# Patient Record
Sex: Male | Born: 1935
Health system: Southern US, Community
[De-identification: ages and names within clinical notes are randomized; demographics above are authoritative.]

## PROBLEM LIST (undated history)

## (undated) DIAGNOSIS — G8929 Other chronic pain: Secondary | ICD-10-CM

## (undated) DIAGNOSIS — M129 Arthropathy, unspecified: Secondary | ICD-10-CM

## (undated) DIAGNOSIS — D693 Immune thrombocytopenic purpura: Principal | ICD-10-CM

## (undated) DIAGNOSIS — K219 Gastro-esophageal reflux disease without esophagitis: Secondary | ICD-10-CM

## (undated) DIAGNOSIS — J309 Allergic rhinitis, unspecified: Secondary | ICD-10-CM

## (undated) DIAGNOSIS — I10 Essential (primary) hypertension: Secondary | ICD-10-CM

## (undated) DIAGNOSIS — R569 Unspecified convulsions: Secondary | ICD-10-CM

## (undated) DIAGNOSIS — Z972 Presence of dental prosthetic device (complete) (partial): Secondary | ICD-10-CM

## (undated) DIAGNOSIS — D519 Vitamin B12 deficiency anemia, unspecified: Secondary | ICD-10-CM

## (undated) DIAGNOSIS — C44301 Unspecified malignant neoplasm of skin of nose: Secondary | ICD-10-CM

## (undated) DIAGNOSIS — Z87898 Personal history of other specified conditions: Secondary | ICD-10-CM

## (undated) DIAGNOSIS — M542 Cervicalgia: Secondary | ICD-10-CM

## (undated) DIAGNOSIS — D509 Iron deficiency anemia, unspecified: Secondary | ICD-10-CM

## (undated) DIAGNOSIS — I639 Cerebral infarction, unspecified: Secondary | ICD-10-CM

## (undated) HISTORY — DX: Cervicalgia: M54.2

## (undated) HISTORY — DX: Other chronic pain: G89.29

## (undated) HISTORY — PX: UPPER GI ENDOSCOPY: SHX6162

## (undated) HISTORY — DX: Personal history of other specified conditions: Z87.898

## (undated) HISTORY — DX: Gastro-esophageal reflux disease without esophagitis: K21.9

## (undated) HISTORY — DX: Cerebral infarction, unspecified: I63.9

## (undated) HISTORY — DX: Arthropathy, unspecified: M12.9

## (undated) HISTORY — DX: Iron deficiency anemia, unspecified: D50.9

## (undated) HISTORY — DX: Essential (primary) hypertension: I10

## (undated) HISTORY — DX: Allergic rhinitis, unspecified: J30.9

## (undated) HISTORY — DX: Immune thrombocytopenic purpura: D69.3

## (undated) HISTORY — PX: COLONOSCOPY: SHX174

## (undated) HISTORY — DX: Vitamin B12 deficiency anemia, unspecified: D51.9

---

## 1988-10-13 HISTORY — PX: CHOLECYSTECTOMY: SHX55

## 2004-05-09 ENCOUNTER — Other Ambulatory Visit: Payer: Self-pay

## 2010-07-14 ENCOUNTER — Ambulatory Visit: Payer: Self-pay | Admitting: Family Medicine

## 2014-07-16 DIAGNOSIS — D693 Immune thrombocytopenic purpura: Secondary | ICD-10-CM

## 2014-07-16 HISTORY — DX: Immune thrombocytopenic purpura: D69.3

## 2014-07-21 ENCOUNTER — Ambulatory Visit: Payer: Self-pay | Admitting: Internal Medicine

## 2014-07-21 LAB — CBC CANCER CENTER
BASOS ABS: 0 x10 3/mm (ref 0.0–0.1)
Basophil %: 0 %
Eosinophil #: 0 x10 3/mm (ref 0.0–0.7)
Eosinophil %: 0 %
HCT: 36.5 % — ABNORMAL LOW (ref 40.0–52.0)
HGB: 11.6 g/dL — AB (ref 13.0–18.0)
Lymphocyte #: 0.7 x10 3/mm — ABNORMAL LOW (ref 1.0–3.6)
Lymphocyte %: 4.9 %
MCH: 28.6 pg (ref 26.0–34.0)
MCHC: 31.7 g/dL — ABNORMAL LOW (ref 32.0–36.0)
MCV: 90 fL (ref 80–100)
Monocyte #: 0.3 x10 3/mm (ref 0.2–1.0)
Monocyte %: 2.1 %
NEUTROS ABS: 12.5 x10 3/mm — AB (ref 1.4–6.5)
NEUTROS PCT: 93 %
Platelet: 58 x10 3/mm — ABNORMAL LOW (ref 150–440)
RBC: 4.05 10*6/uL — AB (ref 4.40–5.90)
RDW: 14.8 % — ABNORMAL HIGH (ref 11.5–14.5)
WBC: 13.1 x10 3/mm — AB (ref 3.8–10.6)

## 2014-07-21 LAB — IRON AND TIBC
IRON: 43 ug/dL — AB (ref 65–175)
Iron Bind.Cap.(Total): 287 ug/dL (ref 250–450)
Iron Saturation: 15 %
Unbound Iron-Bind.Cap.: 244 ug/dL

## 2014-07-21 LAB — FERRITIN: Ferritin (ARMC): 19 ng/mL (ref 8–388)

## 2014-07-21 LAB — FOLATE: FOLIC ACID: 19.4 ng/mL (ref 3.1–100.0)

## 2014-07-21 LAB — APTT: Activated PTT: 24.7 secs (ref 23.6–35.9)

## 2014-07-21 LAB — RETICULOCYTES
Absolute Retic Count: 0.0799 10*6/uL (ref 0.019–0.186)
Reticulocyte: 1.97 % (ref 0.4–3.1)

## 2014-07-21 LAB — FIBRINOGEN: Fibrinogen: 243 mg/dL (ref 210–470)

## 2014-07-21 LAB — FIBRIN DEGRADATION PROD.(ARMC ONLY): Fibrin Degradation Prod.: <10 ug/ml (ref 2.1–7.7)

## 2014-07-21 LAB — PROTIME-INR
INR: 1
PROTHROMBIN TIME: 12.9 s (ref 11.5–14.7)

## 2014-07-21 LAB — LACTATE DEHYDROGENASE: LDH: 164 U/L (ref 85–241)

## 2014-07-23 LAB — PROT IMMUNOELECTROPHORES(ARMC)

## 2014-07-24 LAB — URINE IEP, RANDOM

## 2014-07-24 LAB — CBC CANCER CENTER
Basophil #: 0 x10 3/mm (ref 0.0–0.1)
Basophil %: 0.1 %
Eosinophil #: 0 x10 3/mm (ref 0.0–0.7)
Eosinophil %: 0 %
HCT: 37.6 % — ABNORMAL LOW (ref 40.0–52.0)
HGB: 12.1 g/dL — ABNORMAL LOW (ref 13.0–18.0)
LYMPHS ABS: 1.5 x10 3/mm (ref 1.0–3.6)
Lymphocyte %: 11.3 %
MCH: 28.7 pg (ref 26.0–34.0)
MCHC: 32.1 g/dL (ref 32.0–36.0)
MCV: 89 fL (ref 80–100)
Monocyte #: 1 x10 3/mm (ref 0.2–1.0)
Monocyte %: 7.4 %
Neutrophil #: 11.1 x10 3/mm — ABNORMAL HIGH (ref 1.4–6.5)
Neutrophil %: 81.2 %
Platelet: 89 x10 3/mm — ABNORMAL LOW (ref 150–440)
RBC: 4.21 10*6/uL — AB (ref 4.40–5.90)
RDW: 14.7 % — ABNORMAL HIGH (ref 11.5–14.5)
WBC: 13.7 x10 3/mm — ABNORMAL HIGH (ref 3.8–10.6)

## 2014-07-28 LAB — BASIC METABOLIC PANEL
Anion Gap: 5 — ABNORMAL LOW (ref 7–16)
BUN: 29 mg/dL — ABNORMAL HIGH (ref 7–18)
CO2: 30 mmol/L (ref 21–32)
Calcium, Total: 8.7 mg/dL (ref 8.5–10.1)
Chloride: 105 mmol/L (ref 98–107)
Creatinine: 1.59 mg/dL — ABNORMAL HIGH (ref 0.60–1.30)
EGFR (African American): 47 — ABNORMAL LOW
GFR CALC NON AF AMER: 40 — AB
Glucose: 154 mg/dL — ABNORMAL HIGH (ref 65–99)
OSMOLALITY: 288 (ref 275–301)
Potassium: 4.2 mmol/L (ref 3.5–5.1)
Sodium: 140 mmol/L (ref 136–145)

## 2014-07-28 LAB — CBC CANCER CENTER
Basophil #: 0 x10 3/mm (ref 0.0–0.1)
Basophil %: 0.2 %
EOS ABS: 0 x10 3/mm (ref 0.0–0.7)
EOS PCT: 0 %
HCT: 39.6 % — ABNORMAL LOW (ref 40.0–52.0)
HGB: 12.7 g/dL — AB (ref 13.0–18.0)
Lymphocyte #: 1.7 x10 3/mm (ref 1.0–3.6)
Lymphocyte %: 8.3 %
MCH: 28.8 pg (ref 26.0–34.0)
MCHC: 32 g/dL (ref 32.0–36.0)
MCV: 90 fL (ref 80–100)
MONO ABS: 1.4 x10 3/mm — AB (ref 0.2–1.0)
Monocyte %: 6.8 %
Neutrophil #: 17.4 x10 3/mm — ABNORMAL HIGH (ref 1.4–6.5)
Neutrophil %: 84.7 %
Platelet: 129 x10 3/mm — ABNORMAL LOW (ref 150–440)
RBC: 4.41 10*6/uL (ref 4.40–5.90)
RDW: 15 % — ABNORMAL HIGH (ref 11.5–14.5)
WBC: 20.6 x10 3/mm — ABNORMAL HIGH (ref 3.8–10.6)

## 2014-08-04 LAB — GLUCOSE, RANDOM: GLUCOSE: 165 mg/dL — AB (ref 65–99)

## 2014-08-04 LAB — CBC CANCER CENTER
BASOS ABS: 0 x10 3/mm (ref 0.0–0.1)
Basophil %: 0.1 %
EOS ABS: 0 x10 3/mm (ref 0.0–0.7)
Eosinophil %: 0.1 %
HCT: 38.4 % — ABNORMAL LOW (ref 40.0–52.0)
HGB: 12.2 g/dL — AB (ref 13.0–18.0)
LYMPHS ABS: 0.7 x10 3/mm — AB (ref 1.0–3.6)
Lymphocyte %: 4 %
MCH: 28.8 pg (ref 26.0–34.0)
MCHC: 31.7 g/dL — AB (ref 32.0–36.0)
MCV: 91 fL (ref 80–100)
Monocyte #: 0.5 x10 3/mm (ref 0.2–1.0)
Monocyte %: 3 %
NEUTROS PCT: 92.8 %
Neutrophil #: 15.5 x10 3/mm — ABNORMAL HIGH (ref 1.4–6.5)
PLATELETS: 119 x10 3/mm — AB (ref 150–440)
RBC: 4.23 10*6/uL — ABNORMAL LOW (ref 4.40–5.90)
RDW: 15.8 % — ABNORMAL HIGH (ref 11.5–14.5)
WBC: 16.7 x10 3/mm — ABNORMAL HIGH (ref 3.8–10.6)

## 2014-08-10 DIAGNOSIS — D509 Iron deficiency anemia, unspecified: Secondary | ICD-10-CM | POA: Insufficient documentation

## 2014-08-11 LAB — CBC CANCER CENTER
BASOS ABS: 0.1 x10 3/mm (ref 0.0–0.1)
Basophil %: 0.7 %
EOS PCT: 0.2 %
Eosinophil #: 0 x10 3/mm (ref 0.0–0.7)
HCT: 37.8 % — ABNORMAL LOW (ref 40.0–52.0)
HGB: 12 g/dL — ABNORMAL LOW (ref 13.0–18.0)
LYMPHS ABS: 0.4 x10 3/mm — AB (ref 1.0–3.6)
Lymphocyte %: 3.8 %
MCH: 29 pg (ref 26.0–34.0)
MCHC: 31.7 g/dL — ABNORMAL LOW (ref 32.0–36.0)
MCV: 92 fL (ref 80–100)
Monocyte #: 0.4 x10 3/mm (ref 0.2–1.0)
Monocyte %: 3.6 %
NEUTROS PCT: 91.7 %
Neutrophil #: 10.7 x10 3/mm — ABNORMAL HIGH (ref 1.4–6.5)
PLATELETS: 106 x10 3/mm — AB (ref 150–440)
RBC: 4.13 10*6/uL — ABNORMAL LOW (ref 4.40–5.90)
RDW: 16.5 % — AB (ref 11.5–14.5)
WBC: 11.7 x10 3/mm — AB (ref 3.8–10.6)

## 2014-08-11 LAB — GLUCOSE, RANDOM: Glucose: 150 mg/dL — ABNORMAL HIGH (ref 65–99)

## 2014-08-13 ENCOUNTER — Ambulatory Visit: Payer: Self-pay | Admitting: Internal Medicine

## 2014-08-18 LAB — CBC CANCER CENTER
Basophil #: 0 x10 3/mm (ref 0.0–0.1)
Basophil %: 0.5 %
Eosinophil #: 0.1 x10 3/mm (ref 0.0–0.7)
Eosinophil %: 0.9 %
HCT: 36.2 % — ABNORMAL LOW (ref 40.0–52.0)
HGB: 11.8 g/dL — ABNORMAL LOW (ref 13.0–18.0)
LYMPHS ABS: 0.4 x10 3/mm — AB (ref 1.0–3.6)
Lymphocyte %: 4.4 %
MCH: 29.3 pg (ref 26.0–34.0)
MCHC: 32.5 g/dL (ref 32.0–36.0)
MCV: 90 fL (ref 80–100)
MONO ABS: 0.4 x10 3/mm (ref 0.2–1.0)
Monocyte %: 5 %
NEUTROS ABS: 7.8 x10 3/mm — AB (ref 1.4–6.5)
NEUTROS PCT: 89.2 %
Platelet: 110 x10 3/mm — ABNORMAL LOW (ref 150–440)
RBC: 4.01 10*6/uL — ABNORMAL LOW (ref 4.40–5.90)
RDW: 16 % — AB (ref 11.5–14.5)
WBC: 8.7 x10 3/mm (ref 3.8–10.6)

## 2014-08-18 LAB — GLUCOSE, RANDOM: GLUCOSE: 135 mg/dL — AB (ref 65–99)

## 2014-08-28 ENCOUNTER — Ambulatory Visit: Payer: Self-pay | Admitting: Gastroenterology

## 2014-08-28 LAB — PLATELET COUNT: Platelet: 255 10*3/uL (ref 150–440)

## 2014-08-28 LAB — PROTIME-INR
INR: 1
Prothrombin Time: 13.3 secs (ref 11.5–14.7)

## 2014-08-31 LAB — PATHOLOGY REPORT

## 2014-09-01 LAB — CBC CANCER CENTER
BASOS ABS: 0 x10 3/mm (ref 0.0–0.1)
Basophil %: 0.3 %
Eosinophil #: 0 x10 3/mm (ref 0.0–0.7)
Eosinophil %: 0.3 %
HCT: 37.5 % — ABNORMAL LOW (ref 40.0–52.0)
HGB: 11.9 g/dL — ABNORMAL LOW (ref 13.0–18.0)
Lymphocyte #: 0.9 x10 3/mm — ABNORMAL LOW (ref 1.0–3.6)
Lymphocyte %: 7.4 %
MCH: 28.2 pg (ref 26.0–34.0)
MCHC: 31.7 g/dL — ABNORMAL LOW (ref 32.0–36.0)
MCV: 89 fL (ref 80–100)
MONO ABS: 0.6 x10 3/mm (ref 0.2–1.0)
Monocyte %: 5.3 %
NEUTROS ABS: 10 x10 3/mm — AB (ref 1.4–6.5)
Neutrophil %: 86.7 %
Platelet: 245 x10 3/mm (ref 150–440)
RBC: 4.21 10*6/uL — ABNORMAL LOW (ref 4.40–5.90)
RDW: 16.1 % — AB (ref 11.5–14.5)
WBC: 11.6 x10 3/mm — AB (ref 3.8–10.6)

## 2014-09-01 LAB — BASIC METABOLIC PANEL
Anion Gap: 6 — ABNORMAL LOW (ref 7–16)
BUN: 21 mg/dL — AB (ref 7–18)
CALCIUM: 8.8 mg/dL (ref 8.5–10.1)
CO2: 28 mmol/L (ref 21–32)
Chloride: 103 mmol/L (ref 98–107)
Creatinine: 1.62 mg/dL — ABNORMAL HIGH (ref 0.60–1.30)
EGFR (African American): 53 — ABNORMAL LOW
GFR CALC NON AF AMER: 44 — AB
Osmolality: 279 (ref 275–301)
Potassium: 5 mmol/L (ref 3.5–5.1)
SODIUM: 137 mmol/L (ref 136–145)

## 2014-09-01 LAB — GLUCOSE, RANDOM: Glucose: 133 mg/dL — ABNORMAL HIGH (ref 65–99)

## 2014-09-13 ENCOUNTER — Ambulatory Visit: Payer: Self-pay | Admitting: Internal Medicine

## 2014-09-14 DIAGNOSIS — K2210 Ulcer of esophagus without bleeding: Secondary | ICD-10-CM | POA: Insufficient documentation

## 2014-09-22 LAB — CBC CANCER CENTER
Basophil #: 0 x10 3/mm (ref 0.0–0.1)
Basophil %: 0.5 %
Eosinophil #: 0.1 x10 3/mm (ref 0.0–0.7)
Eosinophil %: 1.5 %
HCT: 33.9 % — ABNORMAL LOW (ref 40.0–52.0)
HGB: 11.3 g/dL — AB (ref 13.0–18.0)
Lymphocyte #: 1 x10 3/mm (ref 1.0–3.6)
Lymphocyte %: 12.1 %
MCH: 29 pg (ref 26.0–34.0)
MCHC: 33.3 g/dL (ref 32.0–36.0)
MCV: 87 fL (ref 80–100)
Monocyte #: 0.7 x10 3/mm (ref 0.2–1.0)
Monocyte %: 8.5 %
NEUTROS ABS: 6.4 x10 3/mm (ref 1.4–6.5)
Neutrophil %: 77.4 %
Platelet: 177 x10 3/mm (ref 150–440)
RBC: 3.89 10*6/uL — ABNORMAL LOW (ref 4.40–5.90)
RDW: 17.5 % — ABNORMAL HIGH (ref 11.5–14.5)
WBC: 8.2 x10 3/mm (ref 3.8–10.6)

## 2014-09-22 LAB — GLUCOSE, RANDOM: Glucose: 97 mg/dL (ref 65–99)

## 2014-10-13 ENCOUNTER — Ambulatory Visit: Payer: Self-pay | Admitting: Internal Medicine

## 2014-10-13 LAB — CBC CANCER CENTER
BASOS ABS: 0 x10 3/mm (ref 0.0–0.1)
Basophil %: 0.5 %
EOS PCT: 2 %
Eosinophil #: 0.1 x10 3/mm (ref 0.0–0.7)
HCT: 34.9 % — AB (ref 40.0–52.0)
HGB: 11.4 g/dL — AB (ref 13.0–18.0)
Lymphocyte #: 1.3 x10 3/mm (ref 1.0–3.6)
Lymphocyte %: 18 %
MCH: 28.5 pg (ref 26.0–34.0)
MCHC: 32.6 g/dL (ref 32.0–36.0)
MCV: 88 fL (ref 80–100)
MONOS PCT: 8.7 %
Monocyte #: 0.6 x10 3/mm (ref 0.2–1.0)
NEUTROS ABS: 5.1 x10 3/mm (ref 1.4–6.5)
Neutrophil %: 70.8 %
PLATELETS: 162 x10 3/mm (ref 150–440)
RBC: 3.98 10*6/uL — AB (ref 4.40–5.90)
RDW: 17.6 % — AB (ref 11.5–14.5)
WBC: 7.2 x10 3/mm (ref 3.8–10.6)

## 2014-10-13 LAB — GLUCOSE, RANDOM: Glucose: 101 mg/dL — ABNORMAL HIGH (ref 65–99)

## 2014-10-19 ENCOUNTER — Ambulatory Visit: Payer: Self-pay | Admitting: Gastroenterology

## 2014-10-19 LAB — CBC WITH DIFFERENTIAL/PLATELET
BASOS PCT: 0.4 %
Basophil #: 0 10*3/uL (ref 0.0–0.1)
Eosinophil #: 0.1 10*3/uL (ref 0.0–0.7)
Eosinophil %: 2.1 %
HCT: 34.7 % — AB (ref 40.0–52.0)
HGB: 11.4 g/dL — ABNORMAL LOW (ref 13.0–18.0)
LYMPHS ABS: 1.1 10*3/uL (ref 1.0–3.6)
Lymphocyte %: 15.6 %
MCH: 28.9 pg (ref 26.0–34.0)
MCHC: 32.7 g/dL (ref 32.0–36.0)
MCV: 88 fL (ref 80–100)
MONO ABS: 0.7 x10 3/mm (ref 0.2–1.0)
MONOS PCT: 9.6 %
NEUTROS ABS: 5 10*3/uL (ref 1.4–6.5)
NEUTROS PCT: 72.3 %
PLATELETS: 148 10*3/uL — AB (ref 150–440)
RBC: 3.94 10*6/uL — ABNORMAL LOW (ref 4.40–5.90)
RDW: 17.3 % — ABNORMAL HIGH (ref 11.5–14.5)
WBC: 6.9 10*3/uL (ref 3.8–10.6)

## 2014-11-03 LAB — CBC CANCER CENTER
Basophil #: 0 x10 3/mm (ref 0.0–0.1)
Basophil %: 0.2 %
Eosinophil #: 0.1 x10 3/mm (ref 0.0–0.7)
Eosinophil %: 1 %
HCT: 33.8 % — ABNORMAL LOW (ref 40.0–52.0)
HGB: 11 g/dL — ABNORMAL LOW (ref 13.0–18.0)
LYMPHS ABS: 1.1 x10 3/mm (ref 1.0–3.6)
Lymphocyte %: 13.3 %
MCH: 28.3 pg (ref 26.0–34.0)
MCHC: 32.6 g/dL (ref 32.0–36.0)
MCV: 87 fL (ref 80–100)
Monocyte #: 0.8 x10 3/mm (ref 0.2–1.0)
Monocyte %: 9.4 %
NEUTROS PCT: 76.1 %
Neutrophil #: 6.4 x10 3/mm (ref 1.4–6.5)
PLATELETS: 157 x10 3/mm (ref 150–440)
RBC: 3.89 10*6/uL — ABNORMAL LOW (ref 4.40–5.90)
RDW: 16.5 % — ABNORMAL HIGH (ref 11.5–14.5)
WBC: 8.3 x10 3/mm (ref 3.8–10.6)

## 2014-11-03 LAB — GLUCOSE, RANDOM: Glucose: 106 mg/dL — ABNORMAL HIGH (ref 65–99)

## 2014-11-13 ENCOUNTER — Ambulatory Visit: Payer: Self-pay | Admitting: Internal Medicine

## 2014-11-25 LAB — CBC CANCER CENTER
BASOS PCT: 0.2 %
Basophil #: 0 x10 3/mm (ref 0.0–0.1)
EOS PCT: 0 %
Eosinophil #: 0 x10 3/mm (ref 0.0–0.7)
HCT: 34.6 % — AB (ref 40.0–52.0)
HGB: 11.1 g/dL — AB (ref 13.0–18.0)
Lymphocyte #: 0.5 x10 3/mm — ABNORMAL LOW (ref 1.0–3.6)
Lymphocyte %: 7.8 %
MCH: 28.6 pg (ref 26.0–34.0)
MCHC: 32 g/dL (ref 32.0–36.0)
MCV: 89 fL (ref 80–100)
MONO ABS: 0.1 x10 3/mm — AB (ref 0.2–1.0)
Monocyte %: 1.8 %
NEUTROS PCT: 90.2 %
Neutrophil #: 5.5 x10 3/mm (ref 1.4–6.5)
Platelet: 135 x10 3/mm — ABNORMAL LOW (ref 150–440)
RBC: 3.87 10*6/uL — AB (ref 4.40–5.90)
RDW: 15.5 % — ABNORMAL HIGH (ref 11.5–14.5)
WBC: 6.1 x10 3/mm (ref 3.8–10.6)

## 2014-11-25 LAB — BASIC METABOLIC PANEL
ANION GAP: 6 — AB (ref 7–16)
BUN: 12 mg/dL (ref 7–18)
CALCIUM: 7.1 mg/dL — AB (ref 8.5–10.1)
Chloride: 106 mmol/L (ref 98–107)
Co2: 28 mmol/L (ref 21–32)
Creatinine: 1.38 mg/dL — ABNORMAL HIGH (ref 0.60–1.30)
GFR CALC NON AF AMER: 53 — AB
Glucose: 190 mg/dL — ABNORMAL HIGH (ref 65–99)
Osmolality: 284 (ref 275–301)
Potassium: 3.8 mmol/L (ref 3.5–5.1)
SODIUM: 140 mmol/L (ref 136–145)

## 2014-11-29 LAB — WOUND CULTURE

## 2014-12-02 LAB — CBC CANCER CENTER
BASOS ABS: 0 x10 3/mm (ref 0.0–0.1)
Basophil %: 0.5 %
EOS ABS: 0.3 x10 3/mm (ref 0.0–0.7)
Eosinophil %: 4 %
HCT: 34.9 % — ABNORMAL LOW (ref 40.0–52.0)
HGB: 11.2 g/dL — ABNORMAL LOW (ref 13.0–18.0)
LYMPHS PCT: 17.2 %
Lymphocyte #: 1.1 x10 3/mm (ref 1.0–3.6)
MCH: 27.8 pg (ref 26.0–34.0)
MCHC: 32 g/dL (ref 32.0–36.0)
MCV: 87 fL (ref 80–100)
Monocyte #: 0.7 x10 3/mm (ref 0.2–1.0)
Monocyte %: 10.4 %
NEUTROS ABS: 4.5 x10 3/mm (ref 1.4–6.5)
NEUTROS PCT: 67.9 %
Platelet: 173 x10 3/mm (ref 150–440)
RBC: 4.03 10*6/uL — ABNORMAL LOW (ref 4.40–5.90)
RDW: 15.2 % — AB (ref 11.5–14.5)
WBC: 6.6 x10 3/mm (ref 3.8–10.6)

## 2014-12-02 LAB — BASIC METABOLIC PANEL
Anion Gap: 7 (ref 7–16)
BUN: 17 mg/dL (ref 7–18)
CALCIUM: 7.2 mg/dL — AB (ref 8.5–10.1)
CO2: 31 mmol/L (ref 21–32)
Chloride: 105 mmol/L (ref 98–107)
Creatinine: 1.41 mg/dL — ABNORMAL HIGH (ref 0.60–1.30)
EGFR (African American): >60
GFR CALC NON AF AMER: 51 — AB
Glucose: 82 mg/dL (ref 65–99)
Osmolality: 286 (ref 275–301)
Potassium: 3.7 mmol/L (ref 3.5–5.1)
SODIUM: 143 mmol/L (ref 136–145)

## 2014-12-14 ENCOUNTER — Ambulatory Visit: Payer: Self-pay | Admitting: Internal Medicine

## 2014-12-16 LAB — CBC CANCER CENTER
BASOS ABS: 0 x10 3/mm (ref 0.0–0.1)
Basophil %: 0.3 %
EOS ABS: 0.1 x10 3/mm (ref 0.0–0.7)
Eosinophil %: 1.7 %
HCT: 36.6 % — AB (ref 40.0–52.0)
HGB: 11.8 g/dL — ABNORMAL LOW (ref 13.0–18.0)
LYMPHS PCT: 18.5 %
Lymphocyte #: 1.3 x10 3/mm (ref 1.0–3.6)
MCH: 27.9 pg (ref 26.0–34.0)
MCHC: 32.1 g/dL (ref 32.0–36.0)
MCV: 87 fL (ref 80–100)
Monocyte #: 0.7 x10 3/mm (ref 0.2–1.0)
Monocyte %: 9.8 %
NEUTROS PCT: 69.7 %
Neutrophil #: 5 x10 3/mm (ref 1.4–6.5)
Platelet: 136 x10 3/mm — ABNORMAL LOW (ref 150–440)
RBC: 4.22 10*6/uL — ABNORMAL LOW (ref 4.40–5.90)
RDW: 15.9 % — AB (ref 11.5–14.5)
WBC: 7.1 x10 3/mm (ref 3.8–10.6)

## 2015-01-12 ENCOUNTER — Ambulatory Visit
Admit: 2015-01-12 | Disposition: A | Discharge status: Home / Self Care | Payer: Self-pay | Attending: Internal Medicine | Admitting: Internal Medicine

## 2015-02-10 LAB — CBC CANCER CENTER
BASOS ABS: 0 x10 3/mm (ref 0.0–0.1)
Basophil %: 0.5 %
EOS ABS: 0.1 x10 3/mm (ref 0.0–0.7)
Eosinophil %: 1.7 %
HCT: 34.5 % — AB (ref 40.0–52.0)
HGB: 11.4 g/dL — AB (ref 13.0–18.0)
LYMPHS ABS: 1.3 x10 3/mm (ref 1.0–3.6)
Lymphocyte %: 15.7 %
MCH: 28.2 pg (ref 26.0–34.0)
MCHC: 33.1 g/dL (ref 32.0–36.0)
MCV: 85 fL (ref 80–100)
MONOS PCT: 7.9 %
Monocyte #: 0.7 x10 3/mm (ref 0.2–1.0)
Neutrophil #: 6.2 x10 3/mm (ref 1.4–6.5)
Neutrophil %: 74.2 %
Platelet: 153 x10 3/mm (ref 150–440)
RBC: 4.05 10*6/uL — ABNORMAL LOW (ref 4.40–5.90)
RDW: 16 % — AB (ref 11.5–14.5)
WBC: 8.3 x10 3/mm (ref 3.8–10.6)

## 2015-02-12 ENCOUNTER — Ambulatory Visit
Admit: 2015-02-12 | Disposition: A | Discharge status: Home / Self Care | Payer: Self-pay | Attending: Internal Medicine | Admitting: Internal Medicine

## 2015-02-24 LAB — HEPATIC FUNCTION PANEL A (ARMC)
ALBUMIN: 3.9 g/dL
AST: 19 U/L
Alkaline Phosphatase: 43 U/L
BILIRUBIN INDIRECT: 0.8
BILIRUBIN TOTAL: 0.9 mg/dL
Bilirubin, Direct: 0.1 mg/dL
SGPT (ALT): 11 U/L — ABNORMAL LOW
TOTAL PROTEIN: 6.5 g/dL

## 2015-02-24 LAB — BASIC METABOLIC PANEL
ANION GAP: 5 — AB (ref 7–16)
BUN: 14 mg/dL
CALCIUM: 8.8 mg/dL — AB
CO2: 29 mmol/L
Chloride: 103 mmol/L
Creatinine: 1.32 mg/dL — ABNORMAL HIGH
EGFR (African American): 58 — ABNORMAL LOW
EGFR (Non-African Amer.): 50 — ABNORMAL LOW
GLUCOSE: 135 mg/dL — AB
Potassium: 4.1 mmol/L
SODIUM: 137 mmol/L

## 2015-02-24 LAB — CBC CANCER CENTER
BASOS ABS: 0 x10 3/mm (ref 0.0–0.1)
Basophil %: 0.6 %
Eosinophil #: 0.2 x10 3/mm (ref 0.0–0.7)
Eosinophil %: 2.3 %
HCT: 37.2 % — ABNORMAL LOW (ref 40.0–52.0)
HGB: 12.1 g/dL — AB (ref 13.0–18.0)
LYMPHS ABS: 1 x10 3/mm (ref 1.0–3.6)
Lymphocyte %: 14.8 %
MCH: 28.1 pg (ref 26.0–34.0)
MCHC: 32.5 g/dL (ref 32.0–36.0)
MCV: 86 fL (ref 80–100)
MONO ABS: 0.5 x10 3/mm (ref 0.2–1.0)
Monocyte %: 8.1 %
NEUTROS PCT: 74.2 %
Neutrophil #: 5 x10 3/mm (ref 1.4–6.5)
Platelet: 145 x10 3/mm — ABNORMAL LOW (ref 150–440)
RBC: 4.31 10*6/uL — AB (ref 4.40–5.90)
RDW: 16.5 % — ABNORMAL HIGH (ref 11.5–14.5)
WBC: 6.8 x10 3/mm (ref 3.8–10.6)

## 2015-03-08 LAB — SURGICAL PATHOLOGY

## 2015-04-20 ENCOUNTER — Other Ambulatory Visit: Payer: Self-pay

## 2015-04-20 DIAGNOSIS — D696 Thrombocytopenia, unspecified: Secondary | ICD-10-CM

## 2015-04-21 ENCOUNTER — Inpatient Hospital Stay: Payer: PPO | Attending: Family Medicine

## 2015-04-21 DIAGNOSIS — D696 Thrombocytopenia, unspecified: Secondary | ICD-10-CM | POA: Diagnosis not present

## 2015-04-21 LAB — CBC WITH DIFFERENTIAL/PLATELET
Basophils Absolute: 0.1 10*3/uL (ref 0–0.1)
Basophils Relative: 1 %
EOS ABS: 0.2 10*3/uL (ref 0–0.7)
Eosinophils Relative: 2 %
HCT: 36.9 % — ABNORMAL LOW (ref 40.0–52.0)
Hemoglobin: 12 g/dL — ABNORMAL LOW (ref 13.0–18.0)
Lymphocytes Relative: 15 %
Lymphs Abs: 1.4 10*3/uL (ref 1.0–3.6)
MCH: 28.2 pg (ref 26.0–34.0)
MCHC: 32.6 g/dL (ref 32.0–36.0)
MCV: 86.5 fL (ref 80.0–100.0)
Monocytes Absolute: 0.8 10*3/uL (ref 0.2–1.0)
Monocytes Relative: 8 %
NEUTROS ABS: 7.2 10*3/uL — AB (ref 1.4–6.5)
Neutrophils Relative %: 74 %
Platelets: 174 10*3/uL (ref 150–440)
RBC: 4.27 MIL/uL — AB (ref 4.40–5.90)
RDW: 15.6 % — ABNORMAL HIGH (ref 11.5–14.5)
WBC: 9.7 10*3/uL (ref 3.8–10.6)

## 2015-06-16 ENCOUNTER — Other Ambulatory Visit: Payer: Self-pay | Admitting: *Deleted

## 2015-06-16 ENCOUNTER — Inpatient Hospital Stay: Payer: PPO | Attending: Internal Medicine

## 2015-06-16 DIAGNOSIS — D696 Thrombocytopenia, unspecified: Secondary | ICD-10-CM

## 2015-06-16 LAB — CBC WITH DIFFERENTIAL/PLATELET
BASOS ABS: 0 10*3/uL (ref 0–0.1)
Basophils Relative: 1 %
Eosinophils Absolute: 0.3 10*3/uL (ref 0–0.7)
Eosinophils Relative: 3 %
HCT: 35.3 % — ABNORMAL LOW (ref 40.0–52.0)
Hemoglobin: 11.6 g/dL — ABNORMAL LOW (ref 13.0–18.0)
LYMPHS ABS: 1.2 10*3/uL (ref 1.0–3.6)
LYMPHS PCT: 16 %
MCH: 28.4 pg (ref 26.0–34.0)
MCHC: 32.8 g/dL (ref 32.0–36.0)
MCV: 86.3 fL (ref 80.0–100.0)
Monocytes Absolute: 0.6 10*3/uL (ref 0.2–1.0)
Monocytes Relative: 9 %
NEUTROS PCT: 71 %
Neutro Abs: 5.2 10*3/uL (ref 1.4–6.5)
Platelets: 142 10*3/uL — ABNORMAL LOW (ref 150–440)
RBC: 4.08 MIL/uL — AB (ref 4.40–5.90)
RDW: 15.2 % — ABNORMAL HIGH (ref 11.5–14.5)
WBC: 7.3 10*3/uL (ref 3.8–10.6)

## 2015-06-16 LAB — BASIC METABOLIC PANEL
ANION GAP: 5 (ref 5–15)
BUN: 15 mg/dL (ref 6–20)
CALCIUM: 7.8 mg/dL — AB (ref 8.9–10.3)
CHLORIDE: 104 mmol/L (ref 101–111)
CO2: 28 mmol/L (ref 22–32)
Creatinine, Ser: 1.43 mg/dL — ABNORMAL HIGH (ref 0.61–1.24)
GFR, EST AFRICAN AMERICAN: 51 mL/min — AB (ref >60)
GFR, EST NON AFRICAN AMERICAN: 44 mL/min — AB (ref >60)
GLUCOSE: 98 mg/dL (ref 65–99)
POTASSIUM: 4.1 mmol/L (ref 3.5–5.1)
Sodium: 137 mmol/L (ref 135–145)

## 2015-08-11 ENCOUNTER — Inpatient Hospital Stay: Payer: PPO | Attending: Internal Medicine

## 2015-08-11 ENCOUNTER — Other Ambulatory Visit: Payer: Self-pay | Admitting: *Deleted

## 2015-08-11 DIAGNOSIS — D693 Immune thrombocytopenic purpura: Secondary | ICD-10-CM

## 2015-08-11 DIAGNOSIS — D696 Thrombocytopenia, unspecified: Secondary | ICD-10-CM | POA: Insufficient documentation

## 2015-08-11 LAB — CBC WITH DIFFERENTIAL/PLATELET
BASOS ABS: 0 10*3/uL (ref 0–0.1)
Basophils Relative: 0 %
Eosinophils Absolute: 0.2 10*3/uL (ref 0–0.7)
Eosinophils Relative: 3 %
HCT: 37.4 % — ABNORMAL LOW (ref 40.0–52.0)
Hemoglobin: 12.5 g/dL — ABNORMAL LOW (ref 13.0–18.0)
LYMPHS ABS: 1 10*3/uL (ref 1.0–3.6)
Lymphocytes Relative: 12 %
MCH: 28.9 pg (ref 26.0–34.0)
MCHC: 33.5 g/dL (ref 32.0–36.0)
MCV: 86.2 fL (ref 80.0–100.0)
Monocytes Absolute: 0.7 10*3/uL (ref 0.2–1.0)
Monocytes Relative: 8 %
Neutro Abs: 6.5 10*3/uL (ref 1.4–6.5)
Neutrophils Relative %: 77 %
PLATELETS: 145 10*3/uL — AB (ref 150–440)
RBC: 4.34 MIL/uL — AB (ref 4.40–5.90)
RDW: 15.5 % — ABNORMAL HIGH (ref 11.5–14.5)
WBC: 8.4 10*3/uL (ref 3.8–10.6)

## 2015-10-05 ENCOUNTER — Other Ambulatory Visit: Payer: Self-pay | Admitting: *Deleted

## 2015-10-05 DIAGNOSIS — D693 Immune thrombocytopenic purpura: Secondary | ICD-10-CM

## 2015-10-06 ENCOUNTER — Ambulatory Visit: Payer: Self-pay | Admitting: Internal Medicine

## 2015-10-06 ENCOUNTER — Inpatient Hospital Stay (HOSPITAL_BASED_OUTPATIENT_CLINIC_OR_DEPARTMENT_OTHER): Payer: PPO | Admitting: Internal Medicine

## 2015-10-06 ENCOUNTER — Inpatient Hospital Stay: Payer: PPO | Attending: Internal Medicine

## 2015-10-06 ENCOUNTER — Encounter: Payer: Self-pay | Admitting: Internal Medicine

## 2015-10-06 VITALS — BP 162/81 | HR 56 | Temp 97.0°F | Resp 18 | Ht 70.0 in | Wt 195.5 lb

## 2015-10-06 DIAGNOSIS — I129 Hypertensive chronic kidney disease with stage 1 through stage 4 chronic kidney disease, or unspecified chronic kidney disease: Secondary | ICD-10-CM | POA: Diagnosis not present

## 2015-10-06 DIAGNOSIS — Z7982 Long term (current) use of aspirin: Secondary | ICD-10-CM

## 2015-10-06 DIAGNOSIS — Z79899 Other long term (current) drug therapy: Secondary | ICD-10-CM

## 2015-10-06 DIAGNOSIS — D649 Anemia, unspecified: Secondary | ICD-10-CM | POA: Diagnosis not present

## 2015-10-06 DIAGNOSIS — D693 Immune thrombocytopenic purpura: Secondary | ICD-10-CM | POA: Diagnosis not present

## 2015-10-06 DIAGNOSIS — N189 Chronic kidney disease, unspecified: Secondary | ICD-10-CM

## 2015-10-06 LAB — CBC WITH DIFFERENTIAL/PLATELET
Basophils Absolute: 0 10*3/uL (ref 0–0.1)
Basophils Relative: 1 %
EOS PCT: 3 %
Eosinophils Absolute: 0.3 10*3/uL (ref 0–0.7)
HCT: 37 % — ABNORMAL LOW (ref 40.0–52.0)
Hemoglobin: 12.2 g/dL — ABNORMAL LOW (ref 13.0–18.0)
Lymphocytes Relative: 13 %
Lymphs Abs: 1.2 10*3/uL (ref 1.0–3.6)
MCH: 28.5 pg (ref 26.0–34.0)
MCHC: 33.1 g/dL (ref 32.0–36.0)
MCV: 86.3 fL (ref 80.0–100.0)
Monocytes Absolute: 0.7 10*3/uL (ref 0.2–1.0)
Monocytes Relative: 8 %
NEUTROS ABS: 6.6 10*3/uL — AB (ref 1.4–6.5)
Neutrophils Relative %: 75 %
PLATELETS: 117 10*3/uL — AB (ref 150–440)
RBC: 4.29 MIL/uL — ABNORMAL LOW (ref 4.40–5.90)
RDW: 15.3 % — ABNORMAL HIGH (ref 11.5–14.5)
WBC: 8.8 10*3/uL (ref 3.8–10.6)

## 2015-10-06 LAB — COMPREHENSIVE METABOLIC PANEL
ALT: 13 U/L — ABNORMAL LOW (ref 17–63)
AST: 20 U/L (ref 15–41)
Albumin: 3.7 g/dL (ref 3.5–5.0)
Alkaline Phosphatase: 58 U/L (ref 38–126)
Anion gap: 4 — ABNORMAL LOW (ref 5–15)
BUN: 16 mg/dL (ref 6–20)
CO2: 29 mmol/L (ref 22–32)
CREATININE: 1.29 mg/dL — AB (ref 0.61–1.24)
Calcium: 8.5 mg/dL — ABNORMAL LOW (ref 8.9–10.3)
Chloride: 105 mmol/L (ref 101–111)
GFR calc Af Amer: 58 mL/min — ABNORMAL LOW (ref >60)
GFR, EST NON AFRICAN AMERICAN: 50 mL/min — AB (ref >60)
Glucose, Bld: 109 mg/dL — ABNORMAL HIGH (ref 65–99)
Potassium: 4.4 mmol/L (ref 3.5–5.1)
Sodium: 138 mmol/L (ref 135–145)
Total Bilirubin: 0.4 mg/dL (ref 0.3–1.2)
Total Protein: 6.4 g/dL — ABNORMAL LOW (ref 6.5–8.1)

## 2015-10-06 NOTE — Progress Notes (Signed)
[name] OFF[facility] PROGRESS NOTE  Patient Care Team: Lynnell Jude, MD as PCP - General (Family Medicine)   SUMMARY OF ONCOLOGIC HISTORY:  # SEP 2015- ITP [platelets- 16]; s/p prednisone ; Good Response; currently surveillance  # SEP 2015- Mild Anemia- ~12 s/p EGD/colo [Oct 2015; Dr.Skulskie] ; Ferritin- 17; NO BMBx  # CKD creatinine ~1.4  INTERVAL HISTORY:  This is my first interaction with the patient since I joined the practice September 2016. I reviewed the patient's prior charts/pertinent labs/imaging in detail; findings are summarized above.   A very pleasant 81 year old male patient with above history of ITP is here for follow-up. Patient denies any blood blisters or gum bleeding or skin rash or nosebleeds.  Denies any weight loss. Denies any unusual chest pain or shortness of breath or cough. Denies any significant fatigue; above from taking care of his wife who is recuperating from illness.   REVIEW OF SYSTEMS:  A complete 10 point review of system is done which is negative except mentioned above/history of present illness.   PAST MEDICAL HISTORY :  Past Medical History  Diagnosis Date  . HTN (hypertension)   . ITP (idiopathic thrombocytopenic purpura) 07/16/14  . GERD (gastroesophageal reflux disease)   . Allergic rhinitis   . B12 deficiency anemia   . CVA (cerebral infarction)   . History of seizures   . Chronic neck pain   . Arthropathy   . IDA (iron deficiency anemia)     PAST SURGICAL HISTORY :   Past Surgical History  Procedure Laterality Date  . Colonoscopy  10/2014, 08/2014    Dr. Gwenlyn Perking  . Upper gi endoscopy    . Cholecystectomy  10/13/1988    FAMILY HISTORY :   Family History  Problem Relation Age of Onset  . Heart disease    . Cirrhosis Mother     SOCIAL HISTORY:   Social History  Substance Use Topics  . Smoking status: Never Smoker   . Smokeless tobacco: Never Used  . Alcohol Use: No    ALLERGIES:  is allergic to  meperidine.  MEDICATIONS:  Current Outpatient Prescriptions  Medication Sig Dispense Refill  . acetaminophen (TYLENOL) 500 MG tablet Take 1 tablet by mouth daily.    Marland Kitchen aspirin 325 MG tablet Take 1 tablet by mouth daily.    Marland Kitchen levETIRAcetam (KEPPRA) 500 MG tablet Take 1 tablet by mouth daily.    . RABEprazole (ACIPHEX) 20 MG tablet Take 1 tablet by mouth daily. 1 hour before meals     No current facility-administered medications for this visit.    PHYSICAL EXAMINATION:   BP 162/81 mmHg  Pulse 56  Temp(Src) 97 F (36.1 C) (Tympanic)  Resp 18  Ht 5\' 10"  (1.778 m)  Wt 195 lb 8.8 oz (88.7 kg)  BMI 28.06 kg/m2  Filed Weights   10/06/15 1523  Weight: 195 lb 8.8 oz (88.7 kg)    GENERAL: Elderly Caucasian male patient who appears younger than his stated age; Alert, no distress and comfortable.   Alone EYES: no pallor or icterus OROPHARYNX: no thrush or ulceration; good dentition  NECK: supple, no masses felt LYMPH:  no palpable lymphadenopathy in the cervical, axillary or inguinal regions LUNGS: clear to auscultation and  No wheeze or crackles HEART/CVS: regular rate & rhythm and no murmurs; No lower extremity edema ABDOMEN:abdomen soft, non-tender and normal bowel sounds Musculoskeletal:no cyanosis of digits and no clubbing  PSYCH: alert & oriented x 3 with fluent speech NEURO: no  focal motor/sensory deficits SKIN:  no rashes or significant lesions  LABORATORY DATA:  I have reviewed the data as listed    Component Value Date/Time   NA 138 10/06/2015 1450   NA 137 02/24/2015 1131   K 4.4 10/06/2015 1450   K 4.1 02/24/2015 1131   CL 105 10/06/2015 1450   CL 103 02/24/2015 1131   CO2 29 10/06/2015 1450   CO2 29 02/24/2015 1131   GLUCOSE 109* 10/06/2015 1450   GLUCOSE 135* 02/24/2015 1131   BUN 16 10/06/2015 1450   BUN 14 02/24/2015 1131   CREATININE 1.29* 10/06/2015 1450   CREATININE 1.32* 02/24/2015 1131   CALCIUM 8.5* 10/06/2015 1450   CALCIUM 8.8* 02/24/2015 1131    PROT 6.4* 10/06/2015 1450   PROT 6.5 02/24/2015 1131   ALBUMIN 3.7 10/06/2015 1450   ALBUMIN 3.9 02/24/2015 1131   AST 20 10/06/2015 1450   AST 19 02/24/2015 1131   ALT 13* 10/06/2015 1450   ALT 11* 02/24/2015 1131   ALKPHOS 58 10/06/2015 1450   ALKPHOS 43 02/24/2015 1131   BILITOT 0.4 10/06/2015 1450   BILITOT 0.9 02/24/2015 1131   GFRNONAA 50* 10/06/2015 1450   GFRNONAA 50* 02/24/2015 1131   GFRNONAA 51* 12/02/2014 1313   GFRAA 58* 10/06/2015 1450   GFRAA 58* 02/24/2015 1131   GFRAA >60 12/02/2014 1313    No results found for: SPEP, UPEP  Lab Results  Component Value Date   WBC 8.8 10/06/2015   NEUTROABS 6.6* 10/06/2015   HGB 12.2* 10/06/2015   HCT 37.0* 10/06/2015   MCV 86.3 10/06/2015   PLT 117* 10/06/2015      Chemistry      Component Value Date/Time   NA 138 10/06/2015 1450   NA 137 02/24/2015 1131   K 4.4 10/06/2015 1450   K 4.1 02/24/2015 1131   CL 105 10/06/2015 1450   CL 103 02/24/2015 1131   CO2 29 10/06/2015 1450   CO2 29 02/24/2015 1131   BUN 16 10/06/2015 1450   BUN 14 02/24/2015 1131   CREATININE 1.29* 10/06/2015 1450   CREATININE 1.32* 02/24/2015 1131      Component Value Date/Time   CALCIUM 8.5* 10/06/2015 1450   CALCIUM 8.8* 02/24/2015 1131   ALKPHOS 58 10/06/2015 1450   ALKPHOS 43 02/24/2015 1131   AST 20 10/06/2015 1450   AST 19 02/24/2015 1131   ALT 13* 10/06/2015 1450   ALT 11* 02/24/2015 1131   BILITOT 0.4 10/06/2015 1450   BILITOT 0.9 02/24/2015 1131       RADIOGRAPHIC STUDIES: I have personally reviewed the radiological images as listed and agreed with the findings in the report. No results found.   ASSESSMENT & PLAN:   # ITP- currently on surveillance. Patient's platelet counts have been > 100,000 at least since September 2015. Patient is been off steroids since December 2015. It appears the patient had a good response to steroids the platelets going up to 200.  Today platelet count is 117. Recommend continued  surveillance.   # Mild anemia. Today Patient's hemoglobin stable at 12 EGD colonoscopy negative.  # Follow-up CBC in 3 months; 6 months/follow-up with me in 6 months.     Cammie Sickle, MD 10/06/2015 3:43 PM

## 2016-01-05 ENCOUNTER — Inpatient Hospital Stay: Payer: PPO | Attending: Internal Medicine

## 2016-01-05 DIAGNOSIS — D693 Immune thrombocytopenic purpura: Secondary | ICD-10-CM | POA: Insufficient documentation

## 2016-01-05 LAB — CBC WITH DIFFERENTIAL/PLATELET
BASOS PCT: 1 %
Basophils Absolute: 0 10*3/uL (ref 0–0.1)
Eosinophils Absolute: 0.2 10*3/uL (ref 0–0.7)
Eosinophils Relative: 3 %
HEMATOCRIT: 37.6 % — AB (ref 40.0–52.0)
HEMOGLOBIN: 12.4 g/dL — AB (ref 13.0–18.0)
Lymphocytes Relative: 15 %
Lymphs Abs: 1.1 10*3/uL (ref 1.0–3.6)
MCH: 28.7 pg (ref 26.0–34.0)
MCHC: 33 g/dL (ref 32.0–36.0)
MCV: 87 fL (ref 80.0–100.0)
MONOS PCT: 10 %
Monocytes Absolute: 0.7 10*3/uL (ref 0.2–1.0)
NEUTROS PCT: 73 %
Neutro Abs: 5.3 10*3/uL (ref 1.4–6.5)
Platelets: 124 10*3/uL — ABNORMAL LOW (ref 150–440)
RBC: 4.32 MIL/uL — AB (ref 4.40–5.90)
RDW: 15 % — ABNORMAL HIGH (ref 11.5–14.5)
WBC: 7.2 10*3/uL (ref 3.8–10.6)

## 2016-01-05 LAB — BASIC METABOLIC PANEL
ANION GAP: 6 (ref 5–15)
BUN: 19 mg/dL (ref 6–20)
CHLORIDE: 104 mmol/L (ref 101–111)
CO2: 27 mmol/L (ref 22–32)
Calcium: 8.5 mg/dL — ABNORMAL LOW (ref 8.9–10.3)
Creatinine, Ser: 1.36 mg/dL — ABNORMAL HIGH (ref 0.61–1.24)
GFR calc non Af Amer: 47 mL/min — ABNORMAL LOW (ref >60)
GFR, EST AFRICAN AMERICAN: 55 mL/min — AB (ref >60)
Glucose, Bld: 107 mg/dL — ABNORMAL HIGH (ref 65–99)
POTASSIUM: 4.2 mmol/L (ref 3.5–5.1)
Sodium: 137 mmol/L (ref 135–145)

## 2016-01-06 ENCOUNTER — Other Ambulatory Visit: Payer: PPO

## 2016-01-07 ENCOUNTER — Telehealth: Payer: Self-pay | Admitting: *Deleted

## 2016-01-07 ENCOUNTER — Other Ambulatory Visit: Payer: Self-pay | Admitting: Family Medicine

## 2016-01-07 NOTE — Telephone Encounter (Signed)
-----   Message from Cammie Sickle, MD sent at 01/07/2016 12:22 PM EST ----- Please inform pt that labs are steady- follow up as schdeduled.

## 2016-01-27 DIAGNOSIS — D519 Vitamin B12 deficiency anemia, unspecified: Secondary | ICD-10-CM | POA: Diagnosis not present

## 2016-01-27 DIAGNOSIS — J301 Allergic rhinitis due to pollen: Secondary | ICD-10-CM | POA: Diagnosis not present

## 2016-04-05 ENCOUNTER — Inpatient Hospital Stay (HOSPITAL_BASED_OUTPATIENT_CLINIC_OR_DEPARTMENT_OTHER): Payer: PPO | Admitting: Internal Medicine

## 2016-04-05 ENCOUNTER — Inpatient Hospital Stay: Payer: PPO | Attending: Internal Medicine

## 2016-04-05 VITALS — BP 149/90 | HR 64 | Temp 96.8°F | Resp 18 | Wt 184.9 lb

## 2016-04-05 DIAGNOSIS — D693 Immune thrombocytopenic purpura: Secondary | ICD-10-CM | POA: Diagnosis not present

## 2016-04-05 DIAGNOSIS — E538 Deficiency of other specified B group vitamins: Secondary | ICD-10-CM | POA: Diagnosis not present

## 2016-04-05 DIAGNOSIS — I129 Hypertensive chronic kidney disease with stage 1 through stage 4 chronic kidney disease, or unspecified chronic kidney disease: Secondary | ICD-10-CM | POA: Diagnosis not present

## 2016-04-05 DIAGNOSIS — Z8669 Personal history of other diseases of the nervous system and sense organs: Secondary | ICD-10-CM

## 2016-04-05 DIAGNOSIS — J309 Allergic rhinitis, unspecified: Secondary | ICD-10-CM | POA: Insufficient documentation

## 2016-04-05 DIAGNOSIS — D649 Anemia, unspecified: Secondary | ICD-10-CM | POA: Insufficient documentation

## 2016-04-05 DIAGNOSIS — N189 Chronic kidney disease, unspecified: Secondary | ICD-10-CM

## 2016-04-05 DIAGNOSIS — Z79899 Other long term (current) drug therapy: Secondary | ICD-10-CM | POA: Insufficient documentation

## 2016-04-05 DIAGNOSIS — K219 Gastro-esophageal reflux disease without esophagitis: Secondary | ICD-10-CM

## 2016-04-05 DIAGNOSIS — Z7982 Long term (current) use of aspirin: Secondary | ICD-10-CM

## 2016-04-05 DIAGNOSIS — L03211 Cellulitis of face: Secondary | ICD-10-CM | POA: Diagnosis not present

## 2016-04-05 DIAGNOSIS — Z8673 Personal history of transient ischemic attack (TIA), and cerebral infarction without residual deficits: Secondary | ICD-10-CM | POA: Insufficient documentation

## 2016-04-05 DIAGNOSIS — D509 Iron deficiency anemia, unspecified: Secondary | ICD-10-CM

## 2016-04-05 LAB — CBC WITH DIFFERENTIAL/PLATELET
BASOS ABS: 0 10*3/uL (ref 0–0.1)
BASOS PCT: 1 %
EOS ABS: 0.3 10*3/uL (ref 0–0.7)
EOS PCT: 3 %
HCT: 37.3 % — ABNORMAL LOW (ref 40.0–52.0)
Hemoglobin: 12.4 g/dL — ABNORMAL LOW (ref 13.0–18.0)
Lymphocytes Relative: 14 %
Lymphs Abs: 1 10*3/uL (ref 1.0–3.6)
MCH: 28.7 pg (ref 26.0–34.0)
MCHC: 33.2 g/dL (ref 32.0–36.0)
MCV: 86.5 fL (ref 80.0–100.0)
MONO ABS: 0.6 10*3/uL (ref 0.2–1.0)
Monocytes Relative: 8 %
Neutro Abs: 5.7 10*3/uL (ref 1.4–6.5)
Neutrophils Relative %: 74 %
Platelets: 136 10*3/uL — ABNORMAL LOW (ref 150–440)
RBC: 4.31 MIL/uL — AB (ref 4.40–5.90)
RDW: 15.3 % — AB (ref 11.5–14.5)
WBC: 7.6 10*3/uL (ref 3.8–10.6)

## 2016-04-05 LAB — BASIC METABOLIC PANEL
ANION GAP: 6 (ref 5–15)
BUN: 13 mg/dL (ref 6–20)
CO2: 28 mmol/L (ref 22–32)
Calcium: 8.4 mg/dL — ABNORMAL LOW (ref 8.9–10.3)
Chloride: 106 mmol/L (ref 101–111)
Creatinine, Ser: 1.44 mg/dL — ABNORMAL HIGH (ref 0.61–1.24)
GFR calc Af Amer: 51 mL/min — ABNORMAL LOW (ref >60)
GFR, EST NON AFRICAN AMERICAN: 44 mL/min — AB (ref >60)
GLUCOSE: 112 mg/dL — AB (ref 65–99)
POTASSIUM: 4.1 mmol/L (ref 3.5–5.1)
SODIUM: 140 mmol/L (ref 135–145)

## 2016-04-05 LAB — IRON AND TIBC
Iron: 32 ug/dL — ABNORMAL LOW (ref 45–182)
Saturation Ratios: 13 % — ABNORMAL LOW (ref 17.9–39.5)
TIBC: 256 ug/dL (ref 250–450)
UIBC: 224 ug/dL

## 2016-04-05 LAB — FERRITIN: FERRITIN: 17 ng/mL — AB (ref 24–336)

## 2016-04-05 MED ORDER — CEPHALEXIN 500 MG PO CAPS: 500.0000 mg | ORAL_CAPSULE | Freq: Three times a day (TID) | ORAL | Status: DC

## 2016-04-05 NOTE — Progress Notes (Signed)
[facility] OFFICE PROGRESS NOTE  Patient Care Team: Lynnell Jude, MD as PCP - General (Family Medicine)   SUMMARY OF ONCOLOGIC HISTORY:  # SEP 2015- ITP [platelets- 16]; s/p prednisone ; Good Response; currently surveillance  # SEP 2015- Mild Anemia- ~12 s/p EGD/colo [Oct 2015; Dr.Skulskie] ; Ferritin- 17; NO BMBx; May 2017- Start PO iron  # CKD creatinine ~1.4 [no nephro]   INTERVAL HISTORY:  A very pleasant 82 year old male patient with above history of ITP is here for follow-up. Patient denies any blood blisters or gum bleeding or skin rash or nosebleeds.  He complains of significant fatigue. He denies any blood in stools black stools. Denies any weight loss. Denies any unusual chest pain or shortness of breath or cough.   Patient's wife noted to have mild swelling and redness of the right of the face under the eye. No eye pain. No fevers.  REVIEW OF SYSTEMS:  A complete 10 point review of system is done which is negative except mentioned above/history of present illness.   PAST MEDICAL HISTORY :  Past Medical History  Diagnosis Date  . HTN (hypertension)   . ITP (idiopathic thrombocytopenic purpura) 07/16/14  . GERD (gastroesophageal reflux disease)   . Allergic rhinitis   . B12 deficiency anemia   . CVA (cerebral infarction)   . History of seizures   . Chronic neck pain   . Arthropathy   . IDA (iron deficiency anemia)     PAST SURGICAL HISTORY :   Past Surgical History  Procedure Laterality Date  . Colonoscopy  10/2014, 08/2014    Dr. Gwenlyn Perking  . Upper gi endoscopy    . Cholecystectomy  10/13/1988    FAMILY HISTORY :   Family History  Problem Relation Age of Onset  . Heart disease    . Cirrhosis Mother     SOCIAL HISTORY:   Social History  Substance Use Topics  . Smoking status: Never Smoker   . Smokeless tobacco: Never Used  . Alcohol Use: No    ALLERGIES:  is allergic to meperidine.  MEDICATIONS:  Current Outpatient Prescriptions   Medication Sig Dispense Refill  . acetaminophen (TYLENOL) 500 MG tablet Take 1 tablet by mouth daily.    Marland Kitchen aspirin 325 MG tablet Take 1 tablet by mouth daily.    Marland Kitchen levETIRAcetam (KEPPRA) 500 MG tablet Take 1 tablet by mouth daily.    . RABEprazole (ACIPHEX) 20 MG tablet Take 1 tablet by mouth daily. 1 hour before meals    . triamcinolone cream (KENALOG) 0.1 %      No current facility-administered medications for this visit.    PHYSICAL EXAMINATION:   BP 149/90 mmHg  Pulse 64  Temp(Src) 96.8 F (36 C) (Tympanic)  Wt 184 lb 14.8 oz (83.88 kg)  Filed Weights   04/05/16 1422  Weight: 184 lb 14.8 oz (83.88 kg)    GENERAL: Elderly Caucasian male patient who appears younger than his stated age; Alert, no distress and comfortable.  Accompanied by his wife. EYES: no pallor or icterus OROPHARYNX: no thrush or ulceration; good dentition  NECK: supple, no masses felt LYMPH:  no palpable lymphadenopathy in the cervical, axillary or inguinal regions LUNGS: clear to auscultation and  No wheeze or crackles HEART/CVS: regular rate & rhythm and no murmurs; No lower extremity edema ABDOMEN:abdomen soft, non-tender and normal bowel sounds Musculoskeletal:no cyanosis of digits and no clubbing  PSYCH: alert & oriented x 3 with fluent speech NEURO: no focal motor/sensory  deficits SKIN:  Erythema and mild warmth noted right of the face.   LABORATORY DATA:  I have reviewed the data as listed    Component Value Date/Time   NA 140 04/05/2016 1409   NA 137 02/24/2015 1131   K 4.1 04/05/2016 1409   K 4.1 02/24/2015 1131   CL 106 04/05/2016 1409   CL 103 02/24/2015 1131   CO2 28 04/05/2016 1409   CO2 29 02/24/2015 1131   GLUCOSE 112* 04/05/2016 1409   GLUCOSE 135* 02/24/2015 1131   BUN 13 04/05/2016 1409   BUN 14 02/24/2015 1131   CREATININE 1.44* 04/05/2016 1409   CREATININE 1.32* 02/24/2015 1131   CALCIUM 8.4* 04/05/2016 1409   CALCIUM 8.8* 02/24/2015 1131   PROT 6.4* 10/06/2015 1450    PROT 6.5 02/24/2015 1131   ALBUMIN 3.7 10/06/2015 1450   ALBUMIN 3.9 02/24/2015 1131   AST 20 10/06/2015 1450   AST 19 02/24/2015 1131   ALT 13* 10/06/2015 1450   ALT 11* 02/24/2015 1131   ALKPHOS 58 10/06/2015 1450   ALKPHOS 43 02/24/2015 1131   BILITOT 0.4 10/06/2015 1450   BILITOT 0.9 02/24/2015 1131   GFRNONAA 44* 04/05/2016 1409   GFRNONAA 50* 02/24/2015 1131   GFRNONAA 51* 12/02/2014 1313   GFRAA 51* 04/05/2016 1409   GFRAA 58* 02/24/2015 1131   GFRAA >60 12/02/2014 1313    No results found for: SPEP, UPEP  Lab Results  Component Value Date   WBC 7.6 04/05/2016   NEUTROABS 5.7 04/05/2016   HGB 12.4* 04/05/2016   HCT 37.3* 04/05/2016   MCV 86.5 04/05/2016   PLT 136* 04/05/2016      Chemistry      Component Value Date/Time   NA 140 04/05/2016 1409   NA 137 02/24/2015 1131   K 4.1 04/05/2016 1409   K 4.1 02/24/2015 1131   CL 106 04/05/2016 1409   CL 103 02/24/2015 1131   CO2 28 04/05/2016 1409   CO2 29 02/24/2015 1131   BUN 13 04/05/2016 1409   BUN 14 02/24/2015 1131   CREATININE 1.44* 04/05/2016 1409   CREATININE 1.32* 02/24/2015 1131      Component Value Date/Time   CALCIUM 8.4* 04/05/2016 1409   CALCIUM 8.8* 02/24/2015 1131   ALKPHOS 58 10/06/2015 1450   ALKPHOS 43 02/24/2015 1131   AST 20 10/06/2015 1450   AST 19 02/24/2015 1131   ALT 13* 10/06/2015 1450   ALT 11* 02/24/2015 1131   BILITOT 0.4 10/06/2015 1450   BILITOT 0.9 02/24/2015 1131        ASSESSMENT & PLAN:   # ITP-Steroid responsive. currently on surveillance. Patient's platelet counts have been > 100,000 at least since September 2015. Today platelets are 120. Asymptomatic. Continue surveillance.  # Mild anemia/likely from CKD. Check Iron studies. Today Patient's hemoglobin stable at 12 EGD colonoscopy negative. Patient is symptomatic from significant fatigue. Recommend by mouth iron pills. If intolerant then recommend IV iron. Patient is also recommended taking a stool  softener.  # CKD- Creatinine 1.4 stable. Reviewed the labs with the patient and family in detail.  # Erythema and swelling of the right side of the face infraorbital- cellulitis recommend Keflex for 7 days. If it does not improve recommend follow up with PCP.  # Follow-up  6 months/follow-up with me labs.   # 25 minutes face-to-face with the patient discussing the above plan of care; more than 50% of time spent on prognosis/ natural history; counseling and coordination.  Cammie Sickle, MD 04/05/2016 2:34 PM

## 2016-04-05 NOTE — Progress Notes (Signed)
Patient ambulates independently without assistance, brought to exam room.  BP 149/90, vitals documented.  Patient c/o right eye irritation, states he was putting mulch down in yard 04/04/16 and shortly after felt eye irritation and redness.  Medication record updated, information provided by patient.

## 2016-04-05 NOTE — Progress Notes (Signed)
Correction- Patient's platelets today are 136; hemoglobin 12.4.

## 2016-06-12 DIAGNOSIS — Z6827 Body mass index (BMI) 27.0-27.9, adult: Secondary | ICD-10-CM | POA: Diagnosis not present

## 2016-06-12 DIAGNOSIS — F5221 Male erectile disorder: Secondary | ICD-10-CM | POA: Diagnosis not present

## 2016-06-12 DIAGNOSIS — D519 Vitamin B12 deficiency anemia, unspecified: Secondary | ICD-10-CM | POA: Diagnosis not present

## 2016-06-12 DIAGNOSIS — I10 Essential (primary) hypertension: Secondary | ICD-10-CM | POA: Diagnosis not present

## 2016-07-13 DIAGNOSIS — D519 Vitamin B12 deficiency anemia, unspecified: Secondary | ICD-10-CM | POA: Diagnosis not present

## 2016-07-13 DIAGNOSIS — Z6827 Body mass index (BMI) 27.0-27.9, adult: Secondary | ICD-10-CM | POA: Diagnosis not present

## 2016-07-13 DIAGNOSIS — L03211 Cellulitis of face: Secondary | ICD-10-CM | POA: Diagnosis not present

## 2016-09-18 DIAGNOSIS — Z23 Encounter for immunization: Secondary | ICD-10-CM | POA: Diagnosis not present

## 2016-09-18 DIAGNOSIS — Z6828 Body mass index (BMI) 28.0-28.9, adult: Secondary | ICD-10-CM | POA: Diagnosis not present

## 2016-09-18 DIAGNOSIS — D519 Vitamin B12 deficiency anemia, unspecified: Secondary | ICD-10-CM | POA: Diagnosis not present

## 2016-09-18 DIAGNOSIS — M1711 Unilateral primary osteoarthritis, right knee: Secondary | ICD-10-CM | POA: Diagnosis not present

## 2016-09-29 DIAGNOSIS — L309 Dermatitis, unspecified: Secondary | ICD-10-CM | POA: Diagnosis not present

## 2016-09-29 DIAGNOSIS — A881 Epidemic vertigo: Secondary | ICD-10-CM | POA: Diagnosis not present

## 2016-09-29 DIAGNOSIS — Z6827 Body mass index (BMI) 27.0-27.9, adult: Secondary | ICD-10-CM | POA: Diagnosis not present

## 2016-10-09 ENCOUNTER — Inpatient Hospital Stay: Payer: PPO | Attending: Internal Medicine

## 2016-10-09 ENCOUNTER — Inpatient Hospital Stay (HOSPITAL_BASED_OUTPATIENT_CLINIC_OR_DEPARTMENT_OTHER): Payer: PPO | Admitting: Internal Medicine

## 2016-10-09 DIAGNOSIS — D509 Iron deficiency anemia, unspecified: Secondary | ICD-10-CM

## 2016-10-09 DIAGNOSIS — E538 Deficiency of other specified B group vitamins: Secondary | ICD-10-CM | POA: Insufficient documentation

## 2016-10-09 DIAGNOSIS — R350 Frequency of micturition: Secondary | ICD-10-CM

## 2016-10-09 DIAGNOSIS — K219 Gastro-esophageal reflux disease without esophagitis: Secondary | ICD-10-CM | POA: Diagnosis not present

## 2016-10-09 DIAGNOSIS — Z8669 Personal history of other diseases of the nervous system and sense organs: Secondary | ICD-10-CM | POA: Insufficient documentation

## 2016-10-09 DIAGNOSIS — L03211 Cellulitis of face: Secondary | ICD-10-CM

## 2016-10-09 DIAGNOSIS — Z79899 Other long term (current) drug therapy: Secondary | ICD-10-CM | POA: Diagnosis not present

## 2016-10-09 DIAGNOSIS — I129 Hypertensive chronic kidney disease with stage 1 through stage 4 chronic kidney disease, or unspecified chronic kidney disease: Secondary | ICD-10-CM | POA: Diagnosis not present

## 2016-10-09 DIAGNOSIS — R3915 Urgency of urination: Secondary | ICD-10-CM | POA: Diagnosis not present

## 2016-10-09 DIAGNOSIS — Z7982 Long term (current) use of aspirin: Secondary | ICD-10-CM | POA: Diagnosis not present

## 2016-10-09 DIAGNOSIS — R5383 Other fatigue: Secondary | ICD-10-CM

## 2016-10-09 DIAGNOSIS — M129 Arthropathy, unspecified: Secondary | ICD-10-CM | POA: Insufficient documentation

## 2016-10-09 DIAGNOSIS — N189 Chronic kidney disease, unspecified: Secondary | ICD-10-CM

## 2016-10-09 DIAGNOSIS — D693 Immune thrombocytopenic purpura: Secondary | ICD-10-CM | POA: Diagnosis not present

## 2016-10-09 DIAGNOSIS — M542 Cervicalgia: Secondary | ICD-10-CM | POA: Insufficient documentation

## 2016-10-09 DIAGNOSIS — Z8673 Personal history of transient ischemic attack (TIA), and cerebral infarction without residual deficits: Secondary | ICD-10-CM

## 2016-10-09 LAB — CBC WITH DIFFERENTIAL/PLATELET
Basophils Absolute: 0 10*3/uL (ref 0–0.1)
Basophils Relative: 1 %
EOS ABS: 0.2 10*3/uL (ref 0–0.7)
EOS PCT: 3 %
HCT: 37.4 % — ABNORMAL LOW (ref 40.0–52.0)
Hemoglobin: 12.6 g/dL — ABNORMAL LOW (ref 13.0–18.0)
LYMPHS ABS: 1.1 10*3/uL (ref 1.0–3.6)
Lymphocytes Relative: 15 %
MCH: 29.3 pg (ref 26.0–34.0)
MCHC: 33.7 g/dL (ref 32.0–36.0)
MCV: 86.9 fL (ref 80.0–100.0)
Monocytes Absolute: 0.7 10*3/uL (ref 0.2–1.0)
Monocytes Relative: 9 %
Neutro Abs: 5.6 10*3/uL (ref 1.4–6.5)
Neutrophils Relative %: 72 %
PLATELETS: 149 10*3/uL — AB (ref 150–440)
RBC: 4.3 MIL/uL — ABNORMAL LOW (ref 4.40–5.90)
RDW: 14.9 % — ABNORMAL HIGH (ref 11.5–14.5)
WBC: 7.7 10*3/uL (ref 3.8–10.6)

## 2016-10-09 LAB — COMPREHENSIVE METABOLIC PANEL
ALT: 12 U/L — ABNORMAL LOW (ref 17–63)
AST: 18 U/L (ref 15–41)
Albumin: 3.8 g/dL (ref 3.5–5.0)
Alkaline Phosphatase: 47 U/L (ref 38–126)
Anion gap: 8 (ref 5–15)
BUN: 21 mg/dL — ABNORMAL HIGH (ref 6–20)
CO2: 27 mmol/L (ref 22–32)
Calcium: 8.7 mg/dL — ABNORMAL LOW (ref 8.9–10.3)
Chloride: 103 mmol/L (ref 101–111)
Creatinine, Ser: 1.43 mg/dL — ABNORMAL HIGH (ref 0.61–1.24)
GFR calc non Af Amer: 44 mL/min — ABNORMAL LOW (ref >60)
GFR, EST AFRICAN AMERICAN: 51 mL/min — AB (ref >60)
Glucose, Bld: 103 mg/dL — ABNORMAL HIGH (ref 65–99)
Potassium: 4.5 mmol/L (ref 3.5–5.1)
Sodium: 138 mmol/L (ref 135–145)
Total Bilirubin: 0.7 mg/dL (ref 0.3–1.2)
Total Protein: 6.8 g/dL (ref 6.5–8.1)

## 2016-10-09 LAB — IRON AND TIBC
Iron: 37 ug/dL — ABNORMAL LOW (ref 45–182)
Saturation Ratios: 14 % — ABNORMAL LOW (ref 17.9–39.5)
TIBC: 266 ug/dL (ref 250–450)
UIBC: 229 ug/dL

## 2016-10-09 LAB — FERRITIN: FERRITIN: 24 ng/mL (ref 24–336)

## 2016-10-09 NOTE — Progress Notes (Signed)
[facility] OFFICE PROGRESS NOTE  Patient Care Team: Lynnell Jude, MD as PCP - General (Family Medicine)   SUMMARY OF ONCOLOGIC HISTORY:  # SEP 2015- ITP [platelets- 16]; s/p prednisone ; Good Response; currently surveillance  # SEP 2015- Mild Anemia- ~12 s/p EGD/colo [Oct 2015; Dr.Skulskie] ; Ferritin- 17; NO BMBx; May 2017- Start PO iron  # CKD creatinine ~1.4 [no nephro]   INTERVAL HISTORY:  A very pleasant 82 year old male patient with above history of ITP is here for follow-up. Patient denies any blood blisters or gum bleeding or skin rash or nosebleeds.  Patient complains of increased frequency of urination especially night. Also complains of urgency; poor stream intermittently.  He complains of significant fatigue. He denies any blood in stools black stools. Denies any weight loss. Denies any unusual chest pain or shortness of breath or cough.   REVIEW OF SYSTEMS:  A complete 10 point review of system is done which is negative except mentioned above/history of present illness.   PAST MEDICAL HISTORY :  Past Medical History:  Diagnosis Date  . Allergic rhinitis   . Arthropathy   . B12 deficiency anemia   . Chronic neck pain   . CVA (cerebral infarction)   . GERD (gastroesophageal reflux disease)   . History of seizures   . HTN (hypertension)   . IDA (iron deficiency anemia)   . ITP (idiopathic thrombocytopenic purpura) 07/16/14    PAST SURGICAL HISTORY :   Past Surgical History:  Procedure Laterality Date  . CHOLECYSTECTOMY  10/13/1988  . COLONOSCOPY  10/2014, 08/2014   Dr. Gwenlyn Perking  . UPPER GI ENDOSCOPY      FAMILY HISTORY :   Family History  Problem Relation Age of Onset  . Heart disease    . Cirrhosis Mother     SOCIAL HISTORY:   Social History  Substance Use Topics  . Smoking status: Never Smoker  . Smokeless tobacco: Never Used  . Alcohol use No    ALLERGIES:  is allergic to meperidine.  MEDICATIONS:  Current Outpatient  Prescriptions  Medication Sig Dispense Refill  . acetaminophen (TYLENOL) 500 MG tablet Take 1 tablet by mouth daily.    Marland Kitchen aspirin 325 MG tablet Take 1 tablet by mouth daily.    . Magnesium 250 MG TABS Take by mouth.    . RABEprazole (ACIPHEX) 20 MG tablet Take 1 tablet by mouth daily. 1 hour before meals    . triamcinolone cream (KENALOG) 0.1 %     . meclizine (ANTIVERT) 25 MG tablet      No current facility-administered medications for this visit.     PHYSICAL EXAMINATION:   BP (!) 146/78 (BP Location: Left Arm, Patient Position: Sitting)   Pulse 68   Temp 97.5 F (36.4 C) (Tympanic)   Resp 18   Wt 180 lb (81.6 kg)   BMI 25.83 kg/m   Filed Weights   10/09/16 1513  Weight: 180 lb (81.6 kg)    GENERAL: Elderly Caucasian male patient who appears younger than his stated age; Alert, no distress and comfortable.  Accompanied by his wife. EYES: no pallor or icterus OROPHARYNX: no thrush or ulceration; good dentition  NECK: supple, no masses felt LYMPH:  no palpable lymphadenopathy in the cervical, axillary or inguinal regions LUNGS: clear to auscultation and  No wheeze or crackles HEART/CVS: regular rate & rhythm and no murmurs; No lower extremity edema ABDOMEN:abdomen soft, non-tender and normal bowel sounds Musculoskeletal:no cyanosis of digits and no clubbing  PSYCH: alert & oriented x 3 with fluent speech NEURO: no focal motor/sensory deficits SKIN:  Erythema and mild warmth noted right of the face.   LABORATORY DATA:  I have reviewed the data as listed    Component Value Date/Time   NA 138 10/09/2016 1435   NA 137 02/24/2015 1131   K 4.5 10/09/2016 1435   K 4.1 02/24/2015 1131   CL 103 10/09/2016 1435   CL 103 02/24/2015 1131   CO2 27 10/09/2016 1435   CO2 29 02/24/2015 1131   GLUCOSE 103 (H) 10/09/2016 1435   GLUCOSE 135 (H) 02/24/2015 1131   BUN 21 (H) 10/09/2016 1435   BUN 14 02/24/2015 1131   CREATININE 1.43 (H) 10/09/2016 1435   CREATININE 1.32 (H)  02/24/2015 1131   CALCIUM 8.7 (L) 10/09/2016 1435   CALCIUM 8.8 (L) 02/24/2015 1131   PROT 6.8 10/09/2016 1435   PROT 6.5 02/24/2015 1131   ALBUMIN 3.8 10/09/2016 1435   ALBUMIN 3.9 02/24/2015 1131   AST 18 10/09/2016 1435   AST 19 02/24/2015 1131   ALT 12 (L) 10/09/2016 1435   ALT 11 (L) 02/24/2015 1131   ALKPHOS 47 10/09/2016 1435   ALKPHOS 43 02/24/2015 1131   BILITOT 0.7 10/09/2016 1435   BILITOT 0.9 02/24/2015 1131   GFRNONAA 44 (L) 10/09/2016 1435   GFRNONAA 50 (L) 02/24/2015 1131   GFRAA 51 (L) 10/09/2016 1435   GFRAA 58 (L) 02/24/2015 1131    No results found for: SPEP, UPEP  Lab Results  Component Value Date   WBC 7.7 10/09/2016   NEUTROABS 5.6 10/09/2016   HGB 12.6 (L) 10/09/2016   HCT 37.4 (L) 10/09/2016   MCV 86.9 10/09/2016   PLT 149 (L) 10/09/2016      Chemistry      Component Value Date/Time   NA 138 10/09/2016 1435   NA 137 02/24/2015 1131   K 4.5 10/09/2016 1435   K 4.1 02/24/2015 1131   CL 103 10/09/2016 1435   CL 103 02/24/2015 1131   CO2 27 10/09/2016 1435   CO2 29 02/24/2015 1131   BUN 21 (H) 10/09/2016 1435   BUN 14 02/24/2015 1131   CREATININE 1.43 (H) 10/09/2016 1435   CREATININE 1.32 (H) 02/24/2015 1131      Component Value Date/Time   CALCIUM 8.7 (L) 10/09/2016 1435   CALCIUM 8.8 (L) 02/24/2015 1131   ALKPHOS 47 10/09/2016 1435   ALKPHOS 43 02/24/2015 1131   AST 18 10/09/2016 1435   AST 19 02/24/2015 1131   ALT 12 (L) 10/09/2016 1435   ALT 11 (L) 02/24/2015 1131   BILITOT 0.7 10/09/2016 1435   BILITOT 0.9 02/24/2015 1131        ASSESSMENT & PLAN:   Idiopathic thrombocytopenic purpura ([place]) # ITP-Steroid responsive. currently on surveillance. Patient's platelet counts have been > 100,000 at least since September 2015. Today platelets are 149 Asymptomatic. Continue surveillance.  # Mild anemia/likely from CKD. On PO iron pills.   # CKD- Creatinine 1.4 stable. Reviewed the labs with the patient and family in detail. Check  iron studies at next visit.   # Increasing frequency of urination- recommend follow up with PCP/ Urology.   # Follow-up  12 months/follow-up with me labs/ 1 week prior.     Cammie Sickle, MD 10/10/2016 9:59 AM

## 2016-10-09 NOTE — Progress Notes (Signed)
Patient is here for follow up, he is doing well no complaints  

## 2016-10-09 NOTE — Assessment & Plan Note (Addendum)
#   ITP-Steroid responsive. currently on surveillance. Patient's platelet counts have been > 100,000 at least since September 2015. Today platelets are 149 Asymptomatic. Continue surveillance.  # Mild anemia/likely from CKD. On PO iron pills.   # CKD- Creatinine 1.4 stable. Reviewed the labs with the patient and family in detail. Check iron studies at next visit.   # Increasing frequency of urination- recommend follow up with PCP/ Urology.   # Follow-up  12 months/follow-up with me labs/ 1 week prior.

## 2016-11-03 ENCOUNTER — Telehealth: Payer: Self-pay | Admitting: *Deleted

## 2016-11-03 ENCOUNTER — Other Ambulatory Visit: Payer: Self-pay | Admitting: Oncology

## 2016-11-03 ENCOUNTER — Inpatient Hospital Stay: Payer: PPO | Attending: Internal Medicine

## 2016-11-03 DIAGNOSIS — D693 Immune thrombocytopenic purpura: Secondary | ICD-10-CM | POA: Diagnosis not present

## 2016-11-03 DIAGNOSIS — D509 Iron deficiency anemia, unspecified: Secondary | ICD-10-CM | POA: Diagnosis not present

## 2016-11-03 DIAGNOSIS — Z8669 Personal history of other diseases of the nervous system and sense organs: Secondary | ICD-10-CM | POA: Insufficient documentation

## 2016-11-03 DIAGNOSIS — Z7982 Long term (current) use of aspirin: Secondary | ICD-10-CM | POA: Diagnosis not present

## 2016-11-03 DIAGNOSIS — M129 Arthropathy, unspecified: Secondary | ICD-10-CM | POA: Insufficient documentation

## 2016-11-03 DIAGNOSIS — N189 Chronic kidney disease, unspecified: Secondary | ICD-10-CM | POA: Insufficient documentation

## 2016-11-03 DIAGNOSIS — Z8673 Personal history of transient ischemic attack (TIA), and cerebral infarction without residual deficits: Secondary | ICD-10-CM | POA: Diagnosis not present

## 2016-11-03 DIAGNOSIS — K219 Gastro-esophageal reflux disease without esophagitis: Secondary | ICD-10-CM | POA: Insufficient documentation

## 2016-11-03 DIAGNOSIS — E559 Vitamin D deficiency, unspecified: Secondary | ICD-10-CM | POA: Diagnosis not present

## 2016-11-03 DIAGNOSIS — I129 Hypertensive chronic kidney disease with stage 1 through stage 4 chronic kidney disease, or unspecified chronic kidney disease: Secondary | ICD-10-CM | POA: Insufficient documentation

## 2016-11-03 DIAGNOSIS — M542 Cervicalgia: Secondary | ICD-10-CM | POA: Insufficient documentation

## 2016-11-03 DIAGNOSIS — Z79899 Other long term (current) drug therapy: Secondary | ICD-10-CM | POA: Insufficient documentation

## 2016-11-03 LAB — IRON AND TIBC
IRON: 55 ug/dL (ref 45–182)
Saturation Ratios: 22 % (ref 17.9–39.5)
TIBC: 251 ug/dL (ref 250–450)
UIBC: 196 ug/dL

## 2016-11-03 LAB — CBC WITH DIFFERENTIAL/PLATELET
Basophils Absolute: 0 10*3/uL (ref 0–0.1)
Basophils Relative: 0 %
Eosinophils Absolute: 0.3 10*3/uL (ref 0–0.7)
Eosinophils Relative: 3 %
HEMATOCRIT: 37.9 % — AB (ref 40.0–52.0)
HEMOGLOBIN: 12.7 g/dL — AB (ref 13.0–18.0)
LYMPHS ABS: 0.8 10*3/uL — AB (ref 1.0–3.6)
LYMPHS PCT: 10 %
MCH: 29.1 pg (ref 26.0–34.0)
MCHC: 33.6 g/dL (ref 32.0–36.0)
MCV: 86.7 fL (ref 80.0–100.0)
MONOS PCT: 9 %
Monocytes Absolute: 0.6 10*3/uL (ref 0.2–1.0)
NEUTROS ABS: 5.9 10*3/uL (ref 1.4–6.5)
NEUTROS PCT: 78 %
Platelets: 17 10*3/uL — CL (ref 150–440)
RBC: 4.37 MIL/uL — AB (ref 4.40–5.90)
RDW: 14.6 % — ABNORMAL HIGH (ref 11.5–14.5)
WBC: 7.6 10*3/uL (ref 3.8–10.6)

## 2016-11-03 LAB — COMPREHENSIVE METABOLIC PANEL
ALK PHOS: 50 U/L (ref 38–126)
ALT: 11 U/L — AB (ref 17–63)
AST: 21 U/L (ref 15–41)
Albumin: 3.7 g/dL (ref 3.5–5.0)
Anion gap: 6 (ref 5–15)
BUN: 15 mg/dL (ref 6–20)
CALCIUM: 8.6 mg/dL — AB (ref 8.9–10.3)
CHLORIDE: 103 mmol/L (ref 101–111)
CO2: 27 mmol/L (ref 22–32)
CREATININE: 1.26 mg/dL — AB (ref 0.61–1.24)
GFR, EST AFRICAN AMERICAN: 60 mL/min — AB (ref >60)
GFR, EST NON AFRICAN AMERICAN: 51 mL/min — AB (ref >60)
Glucose, Bld: 154 mg/dL — ABNORMAL HIGH (ref 65–99)
Potassium: 4.2 mmol/L (ref 3.5–5.1)
Sodium: 136 mmol/L (ref 135–145)
Total Bilirubin: 0.6 mg/dL (ref 0.3–1.2)
Total Protein: 6.4 g/dL — ABNORMAL LOW (ref 6.5–8.1)

## 2016-11-03 LAB — FERRITIN: FERRITIN: 21 ng/mL — AB (ref 24–336)

## 2016-11-03 MED ORDER — PREDNISONE 50 MG PO TABS: ORAL_TABLET | ORAL | 0 refills | Status: DC

## 2016-11-03 NOTE — Telephone Encounter (Signed)
Please advise 

## 2016-11-03 NOTE — Telephone Encounter (Signed)
Wife called to report an episode of syncope on Monday of this week, new areas of multple briuses. She states that last time this happened Dr. Burlene Arnt prescribed steroids. She wants to know if he can be seen today.

## 2016-11-03 NOTE — Telephone Encounter (Signed)
Called wife to inform her of 11:30 AM lab appointment today. She understands that they are to wait for results.

## 2016-11-08 ENCOUNTER — Other Ambulatory Visit: Payer: Self-pay | Admitting: *Deleted

## 2016-11-08 DIAGNOSIS — D693 Immune thrombocytopenic purpura: Secondary | ICD-10-CM

## 2016-11-09 ENCOUNTER — Other Ambulatory Visit: Payer: Self-pay

## 2016-11-09 ENCOUNTER — Inpatient Hospital Stay (HOSPITAL_BASED_OUTPATIENT_CLINIC_OR_DEPARTMENT_OTHER): Payer: PPO | Admitting: Oncology

## 2016-11-09 ENCOUNTER — Inpatient Hospital Stay: Payer: PPO

## 2016-11-09 VITALS — BP 166/75 | HR 62 | Temp 96.7°F | Resp 18 | Wt 182.9 lb

## 2016-11-09 DIAGNOSIS — Z79899 Other long term (current) drug therapy: Secondary | ICD-10-CM

## 2016-11-09 DIAGNOSIS — D509 Iron deficiency anemia, unspecified: Secondary | ICD-10-CM | POA: Diagnosis not present

## 2016-11-09 DIAGNOSIS — I129 Hypertensive chronic kidney disease with stage 1 through stage 4 chronic kidney disease, or unspecified chronic kidney disease: Secondary | ICD-10-CM

## 2016-11-09 DIAGNOSIS — M542 Cervicalgia: Secondary | ICD-10-CM

## 2016-11-09 DIAGNOSIS — Z7982 Long term (current) use of aspirin: Secondary | ICD-10-CM

## 2016-11-09 DIAGNOSIS — N189 Chronic kidney disease, unspecified: Secondary | ICD-10-CM | POA: Diagnosis not present

## 2016-11-09 DIAGNOSIS — Z8673 Personal history of transient ischemic attack (TIA), and cerebral infarction without residual deficits: Secondary | ICD-10-CM

## 2016-11-09 DIAGNOSIS — D693 Immune thrombocytopenic purpura: Secondary | ICD-10-CM

## 2016-11-09 DIAGNOSIS — M129 Arthropathy, unspecified: Secondary | ICD-10-CM

## 2016-11-09 DIAGNOSIS — K219 Gastro-esophageal reflux disease without esophagitis: Secondary | ICD-10-CM

## 2016-11-09 DIAGNOSIS — E559 Vitamin D deficiency, unspecified: Secondary | ICD-10-CM

## 2016-11-09 DIAGNOSIS — Z8669 Personal history of other diseases of the nervous system and sense organs: Secondary | ICD-10-CM

## 2016-11-09 LAB — CBC WITH DIFFERENTIAL/PLATELET
BASOS ABS: 0 10*3/uL (ref 0–0.1)
BASOS PCT: 0 %
EOS ABS: 0 10*3/uL (ref 0–0.7)
EOS PCT: 0 %
HCT: 38.9 % — ABNORMAL LOW (ref 40.0–52.0)
Hemoglobin: 12.9 g/dL — ABNORMAL LOW (ref 13.0–18.0)
Lymphocytes Relative: 10 %
Lymphs Abs: 1.5 10*3/uL (ref 1.0–3.6)
MCH: 29.1 pg (ref 26.0–34.0)
MCHC: 33.1 g/dL (ref 32.0–36.0)
MCV: 87.8 fL (ref 80.0–100.0)
MONO ABS: 1.2 10*3/uL — AB (ref 0.2–1.0)
Monocytes Relative: 8 %
Neutro Abs: 12.7 10*3/uL — ABNORMAL HIGH (ref 1.4–6.5)
Neutrophils Relative %: 82 %
PLATELETS: 164 10*3/uL (ref 150–440)
RBC: 4.43 MIL/uL (ref 4.40–5.90)
RDW: 15.4 % — AB (ref 11.5–14.5)
WBC: 15.5 10*3/uL — AB (ref 3.8–10.6)

## 2016-11-09 NOTE — Progress Notes (Signed)
Offers no complaints. Continues to prednisone daily and has 2-3 pills left.

## 2016-11-10 NOTE — Progress Notes (Signed)
[place] OFFICE PROGRESS NOTE  Patient Care Team: Lynnell Jude, MD as PCP - General (Family Medicine)   SUMMARY OF ONCOLOGIC HISTORY:  # SEP 2015- ITP [platelets- 16]; s/p prednisone ; Good Response; currently surveillance  # SEP 2015- Mild Anemia- ~12 s/p EGD/colo [Oct 2015; Dr.Skulskie] ; Ferritin- 17; NO BMBx; May 2017- Start PO iron  # CKD creatinine ~1.4 [no nephro]   INTERVAL HISTORY: Patient returns to clinic today as an add-on after found to have a significantly decreased platelet count and increased bruising in approximately one week ago. Patient was placed on 50 mg prednisone once a day and returns to clinic today for repeat laboratory work and further evaluation. He currently feels well and is asymptomatic. He does not complain of additional bruising or further bleeding. He has no neurologic complaints. He does not complain of weakness or fatigue. He denies any fevers. He has no chest pain or shortness of breath. He denies any nausea, vomiting, constipation, or diarrhea. Patient feels at his baseline and offers no further specific complaints.  REVIEW OF SYSTEMS:  A complete 10 point review of system is done which is negative except mentioned above/history of present illness.   PAST MEDICAL HISTORY :  Past Medical History:  Diagnosis Date  . Allergic rhinitis   . Arthropathy   . B12 deficiency anemia   . Chronic neck pain   . CVA (cerebral infarction)   . GERD (gastroesophageal reflux disease)   . History of seizures   . HTN (hypertension)   . IDA (iron deficiency anemia)   . ITP (idiopathic thrombocytopenic purpura) 07/16/14    PAST SURGICAL HISTORY :   Past Surgical History:  Procedure Laterality Date  . CHOLECYSTECTOMY  10/13/1988  . COLONOSCOPY  10/2014, 08/2014   Dr. Gwenlyn Perking  . UPPER GI ENDOSCOPY      FAMILY HISTORY :   Family History  Problem Relation Age of Onset  . Heart disease    . Cirrhosis Mother     SOCIAL HISTORY:   Social  History  Substance Use Topics  . Smoking status: Never Smoker  . Smokeless tobacco: Never Used  . Alcohol use No    ALLERGIES:  is allergic to meperidine.  MEDICATIONS:  Current Outpatient Prescriptions  Medication Sig Dispense Refill  . acetaminophen (TYLENOL) 500 MG tablet Take 1 tablet by mouth daily.    Marland Kitchen aspirin 325 MG tablet Take 1 tablet by mouth daily.    . Magnesium 250 MG TABS Take by mouth.    . meclizine (ANTIVERT) 25 MG tablet     . predniSONE (DELTASONE) 50 MG tablet 1 tab daily x7 days. 7 tablet 0  . RABEprazole (ACIPHEX) 20 MG tablet Take 1 tablet by mouth daily. 1 hour before meals    . triamcinolone cream (KENALOG) 0.1 %      No current facility-administered medications for this visit.     PHYSICAL EXAMINATION:   BP (!) 166/75 (BP Location: Left Arm, Patient Position: Sitting)   Pulse 62   Temp (!) 96.7 F (35.9 C) (Tympanic)   Resp 18   Wt 182 lb 14 oz (83 kg)   BMI 26.24 kg/m   Filed Weights   11/09/16 1448  Weight: 182 lb 14 oz (83 kg)    GENERAL: Elderly Caucasian male patient who appears younger than his stated age; Alert, no distress and comfortable.  Accompanied by his wife. EYES: no pallor or icterus OROPHARYNX: no thrush or ulceration; good dentition  NECK: supple, no masses felt LYMPH:  no palpable lymphadenopathy in the cervical, axillary or inguinal regions LUNGS: clear to auscultation and  No wheeze or crackles HEART/CVS: regular rate & rhythm and no murmurs; No lower extremity edema ABDOMEN:abdomen soft, non-tender and normal bowel sounds Musculoskeletal:no cyanosis of digits and no clubbing  PSYCH: alert & oriented x 3 with fluent speech NEURO: no focal motor/sensory deficits SKIN:  Erythema and mild warmth noted right of the face.   LABORATORY DATA:  I have reviewed the data as listed    Component Value Date/Time   NA 136 11/03/2016 1100   NA 137 02/24/2015 1131   K 4.2 11/03/2016 1100   K 4.1 02/24/2015 1131   CL 103  11/03/2016 1100   CL 103 02/24/2015 1131   CO2 27 11/03/2016 1100   CO2 29 02/24/2015 1131   GLUCOSE 154 (H) 11/03/2016 1100   GLUCOSE 135 (H) 02/24/2015 1131   BUN 15 11/03/2016 1100   BUN 14 02/24/2015 1131   CREATININE 1.26 (H) 11/03/2016 1100   CREATININE 1.32 (H) 02/24/2015 1131   CALCIUM 8.6 (L) 11/03/2016 1100   CALCIUM 8.8 (L) 02/24/2015 1131   PROT 6.4 (L) 11/03/2016 1100   PROT 6.5 02/24/2015 1131   ALBUMIN 3.7 11/03/2016 1100   ALBUMIN 3.9 02/24/2015 1131   AST 21 11/03/2016 1100   AST 19 02/24/2015 1131   ALT 11 (L) 11/03/2016 1100   ALT 11 (L) 02/24/2015 1131   ALKPHOS 50 11/03/2016 1100   ALKPHOS 43 02/24/2015 1131   BILITOT 0.6 11/03/2016 1100   BILITOT 0.9 02/24/2015 1131   GFRNONAA 51 (L) 11/03/2016 1100   GFRNONAA 50 (L) 02/24/2015 1131   GFRAA 60 (L) 11/03/2016 1100   GFRAA 58 (L) 02/24/2015 1131    No results found for: SPEP, UPEP  Lab Results  Component Value Date   WBC 15.5 (H) 11/09/2016   NEUTROABS 12.7 (H) 11/09/2016   HGB 12.9 (L) 11/09/2016   HCT 38.9 (L) 11/09/2016   MCV 87.8 11/09/2016   PLT 164 11/09/2016      Chemistry      Component Value Date/Time   NA 136 11/03/2016 1100   NA 137 02/24/2015 1131   K 4.2 11/03/2016 1100   K 4.1 02/24/2015 1131   CL 103 11/03/2016 1100   CL 103 02/24/2015 1131   CO2 27 11/03/2016 1100   CO2 29 02/24/2015 1131   BUN 15 11/03/2016 1100   BUN 14 02/24/2015 1131   CREATININE 1.26 (H) 11/03/2016 1100   CREATININE 1.32 (H) 02/24/2015 1131      Component Value Date/Time   CALCIUM 8.6 (L) 11/03/2016 1100   CALCIUM 8.8 (L) 02/24/2015 1131   ALKPHOS 50 11/03/2016 1100   ALKPHOS 43 02/24/2015 1131   AST 21 11/03/2016 1100   AST 19 02/24/2015 1131   ALT 11 (L) 11/03/2016 1100   ALT 11 (L) 02/24/2015 1131   BILITOT 0.6 11/03/2016 1100   BILITOT 0.9 02/24/2015 1131        ASSESSMENT & PLAN: ITP.  1. ITP: Patient was previously found to be responsive to prednisone and was initiated on 50 mg  daily for 7 days. His platelet count increased from 17 to 164. Patient states he has 2 or 3 days left of treatment and has been instructed to complete a seven-day course. No further intervention is needed. Patient will return to clinic in one month for repeat laboratory work and further evaluation.  Approximately 30 minutes was  spent in discussion of which greater than 50% was consultation.   Lloyd Huger, MD 11/10/2016 4:17 PM

## 2016-12-11 ENCOUNTER — Inpatient Hospital Stay: Payer: PPO | Attending: Internal Medicine | Admitting: Internal Medicine

## 2016-12-11 ENCOUNTER — Inpatient Hospital Stay: Payer: PPO

## 2016-12-11 VITALS — BP 147/80 | HR 63 | Temp 95.3°F | Wt 182.5 lb

## 2016-12-11 DIAGNOSIS — Z9049 Acquired absence of other specified parts of digestive tract: Secondary | ICD-10-CM | POA: Insufficient documentation

## 2016-12-11 DIAGNOSIS — N189 Chronic kidney disease, unspecified: Secondary | ICD-10-CM | POA: Insufficient documentation

## 2016-12-11 DIAGNOSIS — E538 Deficiency of other specified B group vitamins: Secondary | ICD-10-CM | POA: Insufficient documentation

## 2016-12-11 DIAGNOSIS — M542 Cervicalgia: Secondary | ICD-10-CM | POA: Insufficient documentation

## 2016-12-11 DIAGNOSIS — D509 Iron deficiency anemia, unspecified: Secondary | ICD-10-CM | POA: Diagnosis not present

## 2016-12-11 DIAGNOSIS — R233 Spontaneous ecchymoses: Secondary | ICD-10-CM | POA: Insufficient documentation

## 2016-12-11 DIAGNOSIS — D693 Immune thrombocytopenic purpura: Secondary | ICD-10-CM

## 2016-12-11 DIAGNOSIS — I129 Hypertensive chronic kidney disease with stage 1 through stage 4 chronic kidney disease, or unspecified chronic kidney disease: Secondary | ICD-10-CM | POA: Insufficient documentation

## 2016-12-11 DIAGNOSIS — Z7982 Long term (current) use of aspirin: Secondary | ICD-10-CM | POA: Diagnosis not present

## 2016-12-11 DIAGNOSIS — Z8673 Personal history of transient ischemic attack (TIA), and cerebral infarction without residual deficits: Secondary | ICD-10-CM | POA: Insufficient documentation

## 2016-12-11 DIAGNOSIS — G8929 Other chronic pain: Secondary | ICD-10-CM | POA: Diagnosis not present

## 2016-12-11 DIAGNOSIS — K219 Gastro-esophageal reflux disease without esophagitis: Secondary | ICD-10-CM | POA: Insufficient documentation

## 2016-12-11 DIAGNOSIS — Z79899 Other long term (current) drug therapy: Secondary | ICD-10-CM | POA: Insufficient documentation

## 2016-12-11 LAB — CBC WITH DIFFERENTIAL/PLATELET
Basophils Absolute: 0 10*3/uL (ref 0–0.1)
Basophils Relative: 1 %
EOS ABS: 0.2 10*3/uL (ref 0–0.7)
Eosinophils Relative: 2 %
HEMATOCRIT: 36.5 % — AB (ref 40.0–52.0)
Hemoglobin: 12.2 g/dL — ABNORMAL LOW (ref 13.0–18.0)
LYMPHS ABS: 1.2 10*3/uL (ref 1.0–3.6)
LYMPHS PCT: 14 %
MCH: 29 pg (ref 26.0–34.0)
MCHC: 33.3 g/dL (ref 32.0–36.0)
MCV: 87.2 fL (ref 80.0–100.0)
MONOS PCT: 10 %
Monocytes Absolute: 0.8 10*3/uL (ref 0.2–1.0)
NEUTROS PCT: 75 %
Neutro Abs: 6.4 10*3/uL (ref 1.4–6.5)
Platelets: 175 10*3/uL (ref 150–440)
RBC: 4.19 MIL/uL — AB (ref 4.40–5.90)
RDW: 15.2 % — ABNORMAL HIGH (ref 11.5–14.5)
WBC: 8.5 10*3/uL (ref 3.8–10.6)

## 2016-12-11 NOTE — Progress Notes (Signed)
Patient here today for follow up.   

## 2016-12-11 NOTE — Progress Notes (Signed)
[place] OFFICE PROGRESS NOTE  Patient Care Team: Lynnell Jude, MD as PCP - General (Family Medicine)   SUMMARY OF ONCOLOGIC HISTORY:  # SEP 2015- ITP [platelets- 16]; s/p prednisone ; Good Response; currently surveillance  # SEP 2015- Mild Anemia- ~12 s/p EGD/colo [Oct 2015; Dr.Skulskie] ; Ferritin- 17; NO BMBx; May 2017- Start PO iron  # CKD creatinine ~1.4 [no nephro]   INTERVAL HISTORY:  A very pleasant 83 year old male patient with above history of ITP is here for follow-up/.  Patient was recently-end of December 2017 was started on steroids as was bruising easily/and had a petechial rash in his legs.  He is currently off steroids.  He denies any blood in stools black stools. Denies any weight loss. Denies any unusual chest pain or shortness of breath or cough.   REVIEW OF SYSTEMS:  A complete 10 point review of system is done which is negative except mentioned above/history of present illness.   PAST MEDICAL HISTORY :  Past Medical History:  Diagnosis Date  . Allergic rhinitis   . Arthropathy   . B12 deficiency anemia   . Chronic neck pain   . CVA (cerebral infarction)   . GERD (gastroesophageal reflux disease)   . History of seizures   . HTN (hypertension)   . IDA (iron deficiency anemia)   . ITP (idiopathic thrombocytopenic purpura) 07/16/14    PAST SURGICAL HISTORY :   Past Surgical History:  Procedure Laterality Date  . CHOLECYSTECTOMY  10/13/1988  . COLONOSCOPY  10/2014, 08/2014   Dr. Gwenlyn Perking  . UPPER GI ENDOSCOPY      FAMILY HISTORY :   Family History  Problem Relation Age of Onset  . Heart disease    . Cirrhosis Mother     SOCIAL HISTORY:   Social History  Substance Use Topics  . Smoking status: Never Smoker  . Smokeless tobacco: Never Used  . Alcohol use No    ALLERGIES:  is allergic to meperidine.  MEDICATIONS:  Current Outpatient Prescriptions  Medication Sig Dispense Refill  . aspirin 325 MG tablet Take 1 tablet by  mouth daily.    . RABEprazole (ACIPHEX) 20 MG tablet Take 1 tablet by mouth daily. 1 hour before meals    . triamcinolone cream (KENALOG) 0.1 %      No current facility-administered medications for this visit.     PHYSICAL EXAMINATION:   BP (!) 147/80 (BP Location: Left Arm, Patient Position: Sitting)   Pulse 63   Temp (!) 95.3 F (35.2 C) (Tympanic)   Wt 182 lb 8 oz (82.8 kg)   BMI 26.19 kg/m   Filed Weights   12/11/16 1541  Weight: 182 lb 8 oz (82.8 kg)    GENERAL: Elderly Caucasian male patient who appears younger than his stated age; Alert, no distress and comfortable.  Accompanied by his wife. EYES: no pallor or icterus OROPHARYNX: no thrush or ulceration; good dentition  NECK: supple, no masses felt LYMPH:  no palpable lymphadenopathy in the cervical, axillary or inguinal regions LUNGS: clear to auscultation and  No wheeze or crackles HEART/CVS: regular rate & rhythm and no murmurs; No lower extremity edema ABDOMEN:abdomen soft, non-tender and normal bowel sounds Musculoskeletal:no cyanosis of digits and no clubbing  PSYCH: alert & oriented x 3 with fluent speech NEURO: no focal motor/sensory deficits SKIN: Chronic ecchymosis noted.  LABORATORY DATA:  I have reviewed the data as listed    Component Value Date/Time   NA 136 11/03/2016 1100  NA 137 02/24/2015 1131   K 4.2 11/03/2016 1100   K 4.1 02/24/2015 1131   CL 103 11/03/2016 1100   CL 103 02/24/2015 1131   CO2 27 11/03/2016 1100   CO2 29 02/24/2015 1131   GLUCOSE 154 (H) 11/03/2016 1100   GLUCOSE 135 (H) 02/24/2015 1131   BUN 15 11/03/2016 1100   BUN 14 02/24/2015 1131   CREATININE 1.26 (H) 11/03/2016 1100   CREATININE 1.32 (H) 02/24/2015 1131   CALCIUM 8.6 (L) 11/03/2016 1100   CALCIUM 8.8 (L) 02/24/2015 1131   PROT 6.4 (L) 11/03/2016 1100   PROT 6.5 02/24/2015 1131   ALBUMIN 3.7 11/03/2016 1100   ALBUMIN 3.9 02/24/2015 1131   AST 21 11/03/2016 1100   AST 19 02/24/2015 1131   ALT 11 (L)  11/03/2016 1100   ALT 11 (L) 02/24/2015 1131   ALKPHOS 50 11/03/2016 1100   ALKPHOS 43 02/24/2015 1131   BILITOT 0.6 11/03/2016 1100   BILITOT 0.9 02/24/2015 1131   GFRNONAA 51 (L) 11/03/2016 1100   GFRNONAA 50 (L) 02/24/2015 1131   GFRAA 60 (L) 11/03/2016 1100   GFRAA 58 (L) 02/24/2015 1131    No results found for: SPEP, UPEP  Lab Results  Component Value Date   WBC 8.5 12/11/2016   NEUTROABS 6.4 12/11/2016   HGB 12.2 (L) 12/11/2016   HCT 36.5 (L) 12/11/2016   MCV 87.2 12/11/2016   PLT 175 12/11/2016      Chemistry      Component Value Date/Time   NA 136 11/03/2016 1100   NA 137 02/24/2015 1131   K 4.2 11/03/2016 1100   K 4.1 02/24/2015 1131   CL 103 11/03/2016 1100   CL 103 02/24/2015 1131   CO2 27 11/03/2016 1100   CO2 29 02/24/2015 1131   BUN 15 11/03/2016 1100   BUN 14 02/24/2015 1131   CREATININE 1.26 (H) 11/03/2016 1100   CREATININE 1.32 (H) 02/24/2015 1131      Component Value Date/Time   CALCIUM 8.6 (L) 11/03/2016 1100   CALCIUM 8.8 (L) 02/24/2015 1131   ALKPHOS 50 11/03/2016 1100   ALKPHOS 43 02/24/2015 1131   AST 21 11/03/2016 1100   AST 19 02/24/2015 1131   ALT 11 (L) 11/03/2016 1100   ALT 11 (L) 02/24/2015 1131   BILITOT 0.6 11/03/2016 1100   BILITOT 0.9 02/24/2015 1131        ASSESSMENT & PLAN:   Idiopathic thrombocytopenic purpura ([place]) # ITP-Steroid responsive. currently on surveillance. Recent relapsed in dec 2017-  S/p prednisone x 7 day; today- 175. Continue surveillance at this time.   # Mild anemia/likely from CKD- Hb 12.2-  On PO iron pills.   # CKD- Creatinine 1.2 stable.   # follow up labs q monthly; follow up in 3 months.     Cammie Sickle, MD 12/12/2016 4:04 PM

## 2016-12-11 NOTE — Assessment & Plan Note (Addendum)
#   ITP-Steroid responsive. currently on surveillance. Recent relapsed in dec 2017-  S/p prednisone x 7 day; today- 175. Continue surveillance at this time.   # Mild anemia/likely from CKD- Hb 12.2-  On PO iron pills.   # CKD- Creatinine 1.2 stable.   # follow up labs q monthly; follow up in 3 months.

## 2017-01-08 ENCOUNTER — Inpatient Hospital Stay: Payer: PPO | Attending: Internal Medicine

## 2017-01-08 DIAGNOSIS — D693 Immune thrombocytopenic purpura: Secondary | ICD-10-CM | POA: Insufficient documentation

## 2017-01-08 LAB — CBC WITH DIFFERENTIAL/PLATELET
Basophils Absolute: 0 10*3/uL (ref 0–0.1)
Basophils Relative: 0 %
Eosinophils Absolute: 0.3 10*3/uL (ref 0–0.7)
Eosinophils Relative: 3 %
HCT: 37.4 % — ABNORMAL LOW (ref 40.0–52.0)
HEMOGLOBIN: 12.8 g/dL — AB (ref 13.0–18.0)
LYMPHS ABS: 1.1 10*3/uL (ref 1.0–3.6)
LYMPHS PCT: 13 %
MCH: 29.8 pg (ref 26.0–34.0)
MCHC: 34.1 g/dL (ref 32.0–36.0)
MCV: 87.3 fL (ref 80.0–100.0)
Monocytes Absolute: 0.8 10*3/uL (ref 0.2–1.0)
Monocytes Relative: 10 %
NEUTROS PCT: 74 %
Neutro Abs: 6.1 10*3/uL (ref 1.4–6.5)
Platelets: 158 10*3/uL (ref 150–440)
RBC: 4.28 MIL/uL — AB (ref 4.40–5.90)
RDW: 15.9 % — ABNORMAL HIGH (ref 11.5–14.5)
WBC: 8.2 10*3/uL (ref 3.8–10.6)

## 2017-01-11 ENCOUNTER — Telehealth: Payer: Self-pay | Admitting: *Deleted

## 2017-01-11 NOTE — Telephone Encounter (Signed)
Was returning [name] call, I advised him that his plt count is nml and to continue monthly surveillance as planned. He repeated back to me and thanked me for letting him know

## 2017-02-05 ENCOUNTER — Inpatient Hospital Stay: Payer: PPO | Attending: Internal Medicine

## 2017-02-05 DIAGNOSIS — D693 Immune thrombocytopenic purpura: Secondary | ICD-10-CM | POA: Insufficient documentation

## 2017-02-05 LAB — CBC WITH DIFFERENTIAL/PLATELET
BASOS ABS: 0 10*3/uL (ref 0–0.1)
Basophils Relative: 1 %
EOS PCT: 2 %
Eosinophils Absolute: 0.1 10*3/uL (ref 0–0.7)
HEMATOCRIT: 39.4 % — AB (ref 40.0–52.0)
HEMOGLOBIN: 13.3 g/dL (ref 13.0–18.0)
LYMPHS ABS: 1.1 10*3/uL (ref 1.0–3.6)
LYMPHS PCT: 14 %
MCH: 29.6 pg (ref 26.0–34.0)
MCHC: 33.7 g/dL (ref 32.0–36.0)
MCV: 87.6 fL (ref 80.0–100.0)
Monocytes Absolute: 0.5 10*3/uL (ref 0.2–1.0)
Monocytes Relative: 7 %
NEUTROS ABS: 6 10*3/uL (ref 1.4–6.5)
Neutrophils Relative %: 76 %
PLATELETS: 164 10*3/uL (ref 150–440)
RBC: 4.49 MIL/uL (ref 4.40–5.90)
RDW: 16.1 % — ABNORMAL HIGH (ref 11.5–14.5)
WBC: 7.8 10*3/uL (ref 3.8–10.6)

## 2017-02-08 DIAGNOSIS — H40003 Preglaucoma, unspecified, bilateral: Secondary | ICD-10-CM | POA: Diagnosis not present

## 2017-03-12 ENCOUNTER — Inpatient Hospital Stay: Payer: PPO | Attending: Internal Medicine | Admitting: Internal Medicine

## 2017-03-12 ENCOUNTER — Inpatient Hospital Stay: Payer: PPO

## 2017-03-12 VITALS — BP 149/81 | HR 62 | Temp 96.5°F | Resp 18 | Ht 70.0 in | Wt 180.6 lb

## 2017-03-12 DIAGNOSIS — K219 Gastro-esophageal reflux disease without esophagitis: Secondary | ICD-10-CM | POA: Insufficient documentation

## 2017-03-12 DIAGNOSIS — N189 Chronic kidney disease, unspecified: Secondary | ICD-10-CM | POA: Insufficient documentation

## 2017-03-12 DIAGNOSIS — Z8673 Personal history of transient ischemic attack (TIA), and cerebral infarction without residual deficits: Secondary | ICD-10-CM | POA: Diagnosis not present

## 2017-03-12 DIAGNOSIS — M129 Arthropathy, unspecified: Secondary | ICD-10-CM | POA: Diagnosis not present

## 2017-03-12 DIAGNOSIS — I129 Hypertensive chronic kidney disease with stage 1 through stage 4 chronic kidney disease, or unspecified chronic kidney disease: Secondary | ICD-10-CM | POA: Insufficient documentation

## 2017-03-12 DIAGNOSIS — E538 Deficiency of other specified B group vitamins: Secondary | ICD-10-CM | POA: Diagnosis not present

## 2017-03-12 DIAGNOSIS — D693 Immune thrombocytopenic purpura: Secondary | ICD-10-CM

## 2017-03-12 DIAGNOSIS — Z79899 Other long term (current) drug therapy: Secondary | ICD-10-CM | POA: Diagnosis not present

## 2017-03-12 DIAGNOSIS — Z7982 Long term (current) use of aspirin: Secondary | ICD-10-CM | POA: Insufficient documentation

## 2017-03-12 DIAGNOSIS — D509 Iron deficiency anemia, unspecified: Secondary | ICD-10-CM | POA: Diagnosis not present

## 2017-03-12 LAB — CBC WITH DIFFERENTIAL/PLATELET
BASOS PCT: 0 %
Basophils Absolute: 0 10*3/uL (ref 0–0.1)
EOS ABS: 0.2 10*3/uL (ref 0–0.7)
Eosinophils Relative: 2 %
HCT: 37.4 % — ABNORMAL LOW (ref 40.0–52.0)
Hemoglobin: 12.8 g/dL — ABNORMAL LOW (ref 13.0–18.0)
LYMPHS ABS: 1.3 10*3/uL (ref 1.0–3.6)
Lymphocytes Relative: 16 %
MCH: 29.6 pg (ref 26.0–34.0)
MCHC: 34.1 g/dL (ref 32.0–36.0)
MCV: 86.7 fL (ref 80.0–100.0)
MONO ABS: 0.8 10*3/uL (ref 0.2–1.0)
MONOS PCT: 10 %
Neutro Abs: 6.2 10*3/uL (ref 1.4–6.5)
Neutrophils Relative %: 72 %
Platelets: 157 10*3/uL (ref 150–440)
RBC: 4.31 MIL/uL — ABNORMAL LOW (ref 4.40–5.90)
RDW: 14.8 % — AB (ref 11.5–14.5)
WBC: 8.6 10*3/uL (ref 3.8–10.6)

## 2017-03-12 LAB — BASIC METABOLIC PANEL
Anion gap: 5 (ref 5–15)
BUN: 15 mg/dL (ref 6–20)
CALCIUM: 8.6 mg/dL — AB (ref 8.9–10.3)
CHLORIDE: 104 mmol/L (ref 101–111)
CO2: 28 mmol/L (ref 22–32)
CREATININE: 1.2 mg/dL (ref 0.61–1.24)
GFR calc Af Amer: >60 mL/min (ref >60)
GFR calc non Af Amer: 54 mL/min — ABNORMAL LOW (ref >60)
GLUCOSE: 104 mg/dL — AB (ref 65–99)
Potassium: 4.5 mmol/L (ref 3.5–5.1)
Sodium: 137 mmol/L (ref 135–145)

## 2017-03-12 NOTE — Progress Notes (Signed)
Patient here to follow-up for ITP. He has no medical complaints today.

## 2017-03-12 NOTE — Assessment & Plan Note (Addendum)
#   ITP-Steroid responsive. currently on surveillance. Recent relapsed in dec 2017-  S/p prednisone x 7 day; currently off steroids. today- 157. Continue surveillance at this time.   # Mild anemia/likely from CKD- Hb 13.2/improving-  On PO iron pills.   # CKD- Creatinine 1.2 stable.   # follow up labs q 3 monthly; follow up in 6 months [if cbc today is normal].

## 2017-03-12 NOTE — Progress Notes (Signed)
[place] OFFICE PROGRESS NOTE  Patient Care Team: Lynnell Jude, MD as PCP - General (Family Medicine)   SUMMARY OF ONCOLOGIC HISTORY:  # SEP 2015- ITP [platelets- 16]; s/p prednisone ; Good Response; currently surveillance  # SEP 2015- Mild Anemia- ~12 s/p EGD/colo [Oct 2015; Dr.Skulskie] ; Ferritin- 17; NO BMBx; May 2017- Start PO iron  # CKD creatinine ~1.4 [no nephro]   INTERVAL HISTORY:  A very pleasant 83 year old male patient with above history of ITP is here for follow-up; most recent relapse was in December 2017. Treated with steroids.  He is currently off steroids.  He denies any blood in stools black stools. Denies any weight loss. Denies any unusual chest pain or shortness of breath or cough.   REVIEW OF SYSTEMS:  A complete 10 point review of system is done which is negative except mentioned above/history of present illness.   PAST MEDICAL HISTORY :  Past Medical History:  Diagnosis Date  . Allergic rhinitis   . Arthropathy   . B12 deficiency anemia   . Chronic neck pain   . CVA (cerebral infarction)   . GERD (gastroesophageal reflux disease)   . History of seizures   . HTN (hypertension)   . IDA (iron deficiency anemia)   . ITP (idiopathic thrombocytopenic purpura) 07/16/14    PAST SURGICAL HISTORY :   Past Surgical History:  Procedure Laterality Date  . CHOLECYSTECTOMY  10/13/1988  . COLONOSCOPY  10/2014, 08/2014   Dr. Gwenlyn Perking  . UPPER GI ENDOSCOPY      FAMILY HISTORY :   Family History  Problem Relation Age of Onset  . Heart disease    . Cirrhosis Mother     SOCIAL HISTORY:   Social History  Substance Use Topics  . Smoking status: Never Smoker  . Smokeless tobacco: Never Used  . Alcohol use No    ALLERGIES:  is allergic to meperidine.  MEDICATIONS:  Current Outpatient Prescriptions  Medication Sig Dispense Refill  . aspirin 325 MG tablet Take 1 tablet by mouth daily.    . ferrous sulfate 325 (65 FE) MG EC tablet Take  325 mg by mouth daily with breakfast.    . Probiotic Product (HEALTHY COLON PO) Take 1 capsule by mouth daily.    . RABEprazole (ACIPHEX) 20 MG tablet Take 1 tablet by mouth daily. 1 hour before meals     No current facility-administered medications for this visit.     PHYSICAL EXAMINATION:   BP (!) 149/81 (BP Location: Right Arm, Patient Position: Sitting)   Pulse 62   Temp (!) 96.5 F (35.8 C) (Tympanic)   Resp 18   Ht 5\' 10"  (1.778 m)   Wt 180 lb 9.6 oz (81.9 kg)   BMI 25.91 kg/m   Filed Weights   03/12/17 1411  Weight: 180 lb 9.6 oz (81.9 kg)    GENERAL: Elderly Caucasian male patient who appears younger than his stated age; Alert, no distress and comfortable.  Accompanied by his wife. EYES: no pallor or icterus OROPHARYNX: no thrush or ulceration; good dentition  NECK: supple, no masses felt LYMPH:  no palpable lymphadenopathy in the cervical, axillary or inguinal regions LUNGS: clear to auscultation and  No wheeze or crackles HEART/CVS: regular rate & rhythm and no murmurs; No lower extremity edema ABDOMEN:abdomen soft, non-tender and normal bowel sounds Musculoskeletal:no cyanosis of digits and no clubbing  PSYCH: alert & oriented x 3 with fluent speech NEURO: no focal motor/sensory deficits SKIN: Chronic ecchymosis  noted.  LABORATORY DATA:  I have reviewed the data as listed    Component Value Date/Time   NA 137 03/12/2017 1355   NA 137 02/24/2015 1131   K 4.5 03/12/2017 1355   K 4.1 02/24/2015 1131   CL 104 03/12/2017 1355   CL 103 02/24/2015 1131   CO2 28 03/12/2017 1355   CO2 29 02/24/2015 1131   GLUCOSE 104 (H) 03/12/2017 1355   GLUCOSE 135 (H) 02/24/2015 1131   BUN 15 03/12/2017 1355   BUN 14 02/24/2015 1131   CREATININE 1.20 03/12/2017 1355   CREATININE 1.32 (H) 02/24/2015 1131   CALCIUM 8.6 (L) 03/12/2017 1355   CALCIUM 8.8 (L) 02/24/2015 1131   PROT 6.4 (L) 11/03/2016 1100   PROT 6.5 02/24/2015 1131   ALBUMIN 3.7 11/03/2016 1100   ALBUMIN  3.9 02/24/2015 1131   AST 21 11/03/2016 1100   AST 19 02/24/2015 1131   ALT 11 (L) 11/03/2016 1100   ALT 11 (L) 02/24/2015 1131   ALKPHOS 50 11/03/2016 1100   ALKPHOS 43 02/24/2015 1131   BILITOT 0.6 11/03/2016 1100   BILITOT 0.9 02/24/2015 1131   GFRNONAA 54 (L) 03/12/2017 1355   GFRNONAA 50 (L) 02/24/2015 1131   GFRAA >60 03/12/2017 1355   GFRAA 58 (L) 02/24/2015 1131    No results found for: SPEP, UPEP  Lab Results  Component Value Date   WBC 8.6 03/12/2017   NEUTROABS 6.2 03/12/2017   HGB 12.8 (L) 03/12/2017   HCT 37.4 (L) 03/12/2017   MCV 86.7 03/12/2017   PLT 157 03/12/2017      Chemistry      Component Value Date/Time   NA 137 03/12/2017 1355   NA 137 02/24/2015 1131   K 4.5 03/12/2017 1355   K 4.1 02/24/2015 1131   CL 104 03/12/2017 1355   CL 103 02/24/2015 1131   CO2 28 03/12/2017 1355   CO2 29 02/24/2015 1131   BUN 15 03/12/2017 1355   BUN 14 02/24/2015 1131   CREATININE 1.20 03/12/2017 1355   CREATININE 1.32 (H) 02/24/2015 1131      Component Value Date/Time   CALCIUM 8.6 (L) 03/12/2017 1355   CALCIUM 8.8 (L) 02/24/2015 1131   ALKPHOS 50 11/03/2016 1100   ALKPHOS 43 02/24/2015 1131   AST 21 11/03/2016 1100   AST 19 02/24/2015 1131   ALT 11 (L) 11/03/2016 1100   ALT 11 (L) 02/24/2015 1131   BILITOT 0.6 11/03/2016 1100   BILITOT 0.9 02/24/2015 1131        ASSESSMENT & PLAN:   Idiopathic thrombocytopenic purpura ([place]) # ITP-Steroid responsive. currently on surveillance. Recent relapsed in dec 2017-  S/p prednisone x 7 day; currently off steroids. today- 157. Continue surveillance at this time.   # Mild anemia/likely from CKD- Hb 13.2/improving-  On PO iron pills.   # CKD- Creatinine 1.2 stable.   # follow up labs q 3 monthly; follow up in 6 months [if cbc today is normal].     Cammie Sickle, MD 03/14/2017 6:57 PM

## 2017-03-13 ENCOUNTER — Other Ambulatory Visit: Payer: Self-pay | Admitting: *Deleted

## 2017-03-13 DIAGNOSIS — D693 Immune thrombocytopenic purpura: Secondary | ICD-10-CM

## 2017-03-14 ENCOUNTER — Telehealth: Payer: Self-pay | Admitting: Internal Medicine

## 2017-03-14 NOTE — Telephone Encounter (Signed)
H/J- please inform patient that platelets are normal at 157; recommend CBC checked in 3 months follow-up with me with CBC in 6 months.

## 2017-03-15 NOTE — Telephone Encounter (Signed)
Almyra Free, CMA spoke with patient on 03/13/2017 at 9:26 AM - Notified pt of results, message already sent to scheduling to sch. Additional apts.

## 2017-06-11 ENCOUNTER — Other Ambulatory Visit: Payer: PPO

## 2017-06-13 ENCOUNTER — Inpatient Hospital Stay: Payer: PPO | Attending: Internal Medicine

## 2017-06-13 ENCOUNTER — Other Ambulatory Visit: Payer: Self-pay

## 2017-06-13 DIAGNOSIS — D693 Immune thrombocytopenic purpura: Secondary | ICD-10-CM | POA: Diagnosis not present

## 2017-06-13 LAB — CBC WITH DIFFERENTIAL/PLATELET
BASOS ABS: 0 10*3/uL (ref 0–0.1)
Basophils Relative: 0 %
Eosinophils Absolute: 0.3 10*3/uL (ref 0–0.7)
Eosinophils Relative: 4 %
HCT: 36.6 % — ABNORMAL LOW (ref 40.0–52.0)
HEMOGLOBIN: 12.5 g/dL — AB (ref 13.0–18.0)
LYMPHS ABS: 1 10*3/uL (ref 1.0–3.6)
LYMPHS PCT: 13 %
MCH: 29.6 pg (ref 26.0–34.0)
MCHC: 34.1 g/dL (ref 32.0–36.0)
MCV: 86.9 fL (ref 80.0–100.0)
Monocytes Absolute: 0.8 10*3/uL (ref 0.2–1.0)
Monocytes Relative: 10 %
NEUTROS PCT: 73 %
Neutro Abs: 5.7 10*3/uL (ref 1.4–6.5)
Platelets: 136 10*3/uL — ABNORMAL LOW (ref 150–440)
RBC: 4.21 MIL/uL — AB (ref 4.40–5.90)
RDW: 15.8 % — ABNORMAL HIGH (ref 11.5–14.5)
WBC: 7.7 10*3/uL (ref 3.8–10.6)

## 2017-06-15 ENCOUNTER — Telehealth: Payer: Self-pay | Admitting: Internal Medicine

## 2017-06-15 DIAGNOSIS — D693 Immune thrombocytopenic purpura: Secondary | ICD-10-CM

## 2017-06-15 NOTE — Telephone Encounter (Signed)
Please inform patient that platelets are slightly low at 136; recommend repeating the CBC in 6 weeks. Please order CBC.  If having any nosebleeds or gum bleedin to let us know ASAP.

## 2017-06-18 NOTE — Telephone Encounter (Signed)
I left voicemail for patient to return phone call.

## 2017-06-19 NOTE — Telephone Encounter (Signed)
Spoke with patient. He is agreeable to come on 9/11 at 145 pm for his lab apt-recheck his cbc. He denies any signs of nose/gum/bleeding. Lab orders entered per md order

## 2017-06-19 NOTE — Addendum Note (Signed)
Addended by: Renita Papa R on: 06/19/2017 11:19 AM   Modules accepted: Orders

## 2017-07-24 ENCOUNTER — Inpatient Hospital Stay: Payer: PPO | Attending: Internal Medicine

## 2017-07-24 DIAGNOSIS — D693 Immune thrombocytopenic purpura: Secondary | ICD-10-CM | POA: Diagnosis not present

## 2017-07-24 LAB — CBC WITH DIFFERENTIAL/PLATELET
Basophils Absolute: 0 10*3/uL (ref 0–0.1)
Basophils Relative: 0 %
Eosinophils Absolute: 0.2 10*3/uL (ref 0–0.7)
Eosinophils Relative: 2 %
HEMATOCRIT: 36.1 % — AB (ref 40.0–52.0)
HEMOGLOBIN: 12.5 g/dL — AB (ref 13.0–18.0)
LYMPHS ABS: 1 10*3/uL (ref 1.0–3.6)
Lymphocytes Relative: 13 %
MCH: 30.2 pg (ref 26.0–34.0)
MCHC: 34.6 g/dL (ref 32.0–36.0)
MCV: 87.3 fL (ref 80.0–100.0)
MONOS PCT: 10 %
Monocytes Absolute: 0.7 10*3/uL (ref 0.2–1.0)
NEUTROS PCT: 75 %
Neutro Abs: 5.8 10*3/uL (ref 1.4–6.5)
Platelets: 153 10*3/uL (ref 150–440)
RBC: 4.13 MIL/uL — AB (ref 4.40–5.90)
RDW: 15.6 % — ABNORMAL HIGH (ref 11.5–14.5)
WBC: 7.7 10*3/uL (ref 3.8–10.6)

## 2017-09-11 ENCOUNTER — Inpatient Hospital Stay: Payer: PPO | Attending: Internal Medicine

## 2017-09-11 DIAGNOSIS — D693 Immune thrombocytopenic purpura: Secondary | ICD-10-CM | POA: Insufficient documentation

## 2017-09-11 LAB — BASIC METABOLIC PANEL
ANION GAP: 7 (ref 5–15)
BUN: 17 mg/dL (ref 6–20)
CALCIUM: 8.6 mg/dL — AB (ref 8.9–10.3)
CO2: 27 mmol/L (ref 22–32)
CREATININE: 1.18 mg/dL (ref 0.61–1.24)
Chloride: 103 mmol/L (ref 101–111)
GFR calc non Af Amer: 55 mL/min — ABNORMAL LOW (ref >60)
GLUCOSE: 141 mg/dL — AB (ref 65–99)
Potassium: 4.4 mmol/L (ref 3.5–5.1)
SODIUM: 137 mmol/L (ref 135–145)

## 2017-09-11 LAB — CBC WITH DIFFERENTIAL/PLATELET
BASOS ABS: 0 10*3/uL (ref 0–0.1)
BASOS PCT: 0 %
EOS ABS: 0.2 10*3/uL (ref 0–0.7)
Eosinophils Relative: 3 %
HEMATOCRIT: 36.7 % — AB (ref 40.0–52.0)
HEMOGLOBIN: 12.3 g/dL — AB (ref 13.0–18.0)
Lymphocytes Relative: 14 %
Lymphs Abs: 0.9 10*3/uL — ABNORMAL LOW (ref 1.0–3.6)
MCH: 29.9 pg (ref 26.0–34.0)
MCHC: 33.7 g/dL (ref 32.0–36.0)
MCV: 89 fL (ref 80.0–100.0)
MONOS PCT: 11 %
Monocytes Absolute: 0.7 10*3/uL (ref 0.2–1.0)
NEUTROS ABS: 4.5 10*3/uL (ref 1.4–6.5)
NEUTROS PCT: 72 %
Platelets: 134 10*3/uL — ABNORMAL LOW (ref 150–440)
RBC: 4.12 MIL/uL — ABNORMAL LOW (ref 4.40–5.90)
RDW: 15.6 % — AB (ref 11.5–14.5)
WBC: 6.3 10*3/uL (ref 3.8–10.6)

## 2017-09-11 LAB — IRON AND TIBC
Iron: 37 ug/dL — ABNORMAL LOW (ref 45–182)
Saturation Ratios: 15 % — ABNORMAL LOW (ref 17.9–39.5)
TIBC: 247 ug/dL — ABNORMAL LOW (ref 250–450)
UIBC: 210 ug/dL

## 2017-09-11 LAB — FERRITIN: Ferritin: 16 ng/mL — ABNORMAL LOW (ref 24–336)

## 2017-09-13 ENCOUNTER — Inpatient Hospital Stay: Payer: PPO | Attending: Internal Medicine | Admitting: Internal Medicine

## 2017-09-13 VITALS — BP 155/82 | HR 58 | Temp 97.0°F | Resp 20 | Ht 70.0 in | Wt 179.2 lb

## 2017-09-13 DIAGNOSIS — D693 Immune thrombocytopenic purpura: Secondary | ICD-10-CM | POA: Diagnosis not present

## 2017-09-13 DIAGNOSIS — D509 Iron deficiency anemia, unspecified: Secondary | ICD-10-CM | POA: Insufficient documentation

## 2017-09-13 DIAGNOSIS — Z8673 Personal history of transient ischemic attack (TIA), and cerebral infarction without residual deficits: Secondary | ICD-10-CM | POA: Diagnosis not present

## 2017-09-13 DIAGNOSIS — Z7982 Long term (current) use of aspirin: Secondary | ICD-10-CM | POA: Diagnosis not present

## 2017-09-13 DIAGNOSIS — N189 Chronic kidney disease, unspecified: Secondary | ICD-10-CM | POA: Insufficient documentation

## 2017-09-13 DIAGNOSIS — Z8669 Personal history of other diseases of the nervous system and sense organs: Secondary | ICD-10-CM | POA: Diagnosis not present

## 2017-09-13 DIAGNOSIS — K219 Gastro-esophageal reflux disease without esophagitis: Secondary | ICD-10-CM | POA: Diagnosis not present

## 2017-09-13 DIAGNOSIS — E538 Deficiency of other specified B group vitamins: Secondary | ICD-10-CM | POA: Diagnosis not present

## 2017-09-13 DIAGNOSIS — M542 Cervicalgia: Secondary | ICD-10-CM | POA: Insufficient documentation

## 2017-09-13 DIAGNOSIS — I129 Hypertensive chronic kidney disease with stage 1 through stage 4 chronic kidney disease, or unspecified chronic kidney disease: Secondary | ICD-10-CM | POA: Diagnosis not present

## 2017-09-13 NOTE — Progress Notes (Signed)
[place] OFFICE PROGRESS NOTE  Patient Care Team: Lynnell Jude, MD as PCP - General (Family Medicine)   SUMMARY OF ONCOLOGIC HISTORY:  # SEP 2015- ITP [platelets- 16]; s/p prednisone ; Good Response; currently surveillance  # SEP 2015- Mild Anemia- ~12 s/p EGD/colo [Oct 2015; Dr.Skulskie] ; Ferritin- 17; NO BMBx; May 2017- Start PO iron  # CKD creatinine ~1.4 [no nephro]   INTERVAL HISTORY:  A very pleasant 83 year old male patient with above history of ITP is here for follow-up; most recent relapse was in December 2017. Treated with steroids.  He is currently off steroids.  Patient denies any unusual shortness of breath or cough. Denies any easy bruising. Denies any blood in stools or black stools. He took himself off his by mouth iron.  REVIEW OF SYSTEMS:  A complete 10 point review of system is done which is negative except mentioned above/history of present illness.   PAST MEDICAL HISTORY :  Past Medical History:  Diagnosis Date  . Allergic rhinitis   . Arthropathy   . B12 deficiency anemia   . Chronic neck pain   . CVA (cerebral infarction)   . GERD (gastroesophageal reflux disease)   . History of seizures   . HTN (hypertension)   . IDA (iron deficiency anemia)   . ITP (idiopathic thrombocytopenic purpura) 07/16/14    PAST SURGICAL HISTORY :   Past Surgical History:  Procedure Laterality Date  . CHOLECYSTECTOMY  10/13/1988  . COLONOSCOPY  10/2014, 08/2014   Dr. Gwenlyn Perking  . UPPER GI ENDOSCOPY      FAMILY HISTORY :   Family History  Problem Relation Age of Onset  . Heart disease Unknown   . Cirrhosis Mother     SOCIAL HISTORY:   Social History   Tobacco Use  . Smoking status: Never Smoker  . Smokeless tobacco: Never Used  Substance Use Topics  . Alcohol use: No    Alcohol/week: 0.0 oz  . Drug use: No    ALLERGIES:  is allergic to meperidine.  MEDICATIONS:  Current Outpatient Medications  Medication Sig Dispense Refill  .  aspirin 325 MG tablet Take 1 tablet by mouth daily.    . RABEprazole (ACIPHEX) 20 MG tablet Take 1 tablet by mouth daily. 1 hour before meals     No current facility-administered medications for this visit.     PHYSICAL EXAMINATION:   BP (!) 155/82 (BP Location: Left Arm, Patient Position: Sitting)   Pulse (!) 58   Temp (!) 97 F (36.1 C) (Tympanic)   Resp 20   Ht 5\' 10"  (1.778 m)   Wt 179 lb 3.2 oz (81.3 kg)   BMI 25.71 kg/m   Filed Weights   09/13/17 1110  Weight: 179 lb 3.2 oz (81.3 kg)    GENERAL: Elderly Caucasian male patient who appears younger than his stated age; Alert, no distress and comfortable.  Accompanied by his wife. EYES: no pallor or icterus OROPHARYNX: no thrush or ulceration; good dentition  NECK: supple, no masses felt LYMPH:  no palpable lymphadenopathy in the cervical, axillary or inguinal regions LUNGS: clear to auscultation and  No wheeze or crackles HEART/CVS: regular rate & rhythm and no murmurs; No lower extremity edema ABDOMEN:abdomen soft, non-tender and normal bowel sounds Musculoskeletal:no cyanosis of digits and no clubbing  PSYCH: alert & oriented x 3 with fluent speech NEURO: no focal motor/sensory deficits SKIN: Chronic ecchymosis noted.  LABORATORY DATA:  I have reviewed the data as listed  Component Value Date/Time   NA 137 09/11/2017 1015   NA 137 02/24/2015 1131   K 4.4 09/11/2017 1015   K 4.1 02/24/2015 1131   CL 103 09/11/2017 1015   CL 103 02/24/2015 1131   CO2 27 09/11/2017 1015   CO2 29 02/24/2015 1131   GLUCOSE 141 (H) 09/11/2017 1015   GLUCOSE 135 (H) 02/24/2015 1131   BUN 17 09/11/2017 1015   BUN 14 02/24/2015 1131   CREATININE 1.18 09/11/2017 1015   CREATININE 1.32 (H) 02/24/2015 1131   CALCIUM 8.6 (L) 09/11/2017 1015   CALCIUM 8.8 (L) 02/24/2015 1131   PROT 6.4 (L) 11/03/2016 1100   PROT 6.5 02/24/2015 1131   ALBUMIN 3.7 11/03/2016 1100   ALBUMIN 3.9 02/24/2015 1131   AST 21 11/03/2016 1100   AST 19  02/24/2015 1131   ALT 11 (L) 11/03/2016 1100   ALT 11 (L) 02/24/2015 1131   ALKPHOS 50 11/03/2016 1100   ALKPHOS 43 02/24/2015 1131   BILITOT 0.6 11/03/2016 1100   BILITOT 0.9 02/24/2015 1131   GFRNONAA 55 (L) 09/11/2017 1015   GFRNONAA 50 (L) 02/24/2015 1131   GFRAA >60 09/11/2017 1015   GFRAA 58 (L) 02/24/2015 1131    No results found for: SPEP, UPEP  Lab Results  Component Value Date   WBC 6.3 09/11/2017   NEUTROABS 4.5 09/11/2017   HGB 12.3 (L) 09/11/2017   HCT 36.7 (L) 09/11/2017   MCV 89.0 09/11/2017   PLT 134 (L) 09/11/2017      Chemistry      Component Value Date/Time   NA 137 09/11/2017 1015   NA 137 02/24/2015 1131   K 4.4 09/11/2017 1015   K 4.1 02/24/2015 1131   CL 103 09/11/2017 1015   CL 103 02/24/2015 1131   CO2 27 09/11/2017 1015   CO2 29 02/24/2015 1131   BUN 17 09/11/2017 1015   BUN 14 02/24/2015 1131   CREATININE 1.18 09/11/2017 1015   CREATININE 1.32 (H) 02/24/2015 1131      Component Value Date/Time   CALCIUM 8.6 (L) 09/11/2017 1015   CALCIUM 8.8 (L) 02/24/2015 1131   ALKPHOS 50 11/03/2016 1100   ALKPHOS 43 02/24/2015 1131   AST 21 11/03/2016 1100   AST 19 02/24/2015 1131   ALT 11 (L) 11/03/2016 1100   ALT 11 (L) 02/24/2015 1131   BILITOT 0.6 11/03/2016 1100   BILITOT 0.9 02/24/2015 1131        ASSESSMENT & PLAN:   Idiopathic thrombocytopenic purpura ([place]) # ITP-Steroid responsive. currently on surveillance. Recent relapsed in dec 2017-  S/p prednisone x 7 day; currently off steroids. today- 134. Continue surveillance at this time.   # IDA-  Mild anemia/likely from CKD- Hb 12.3improving- recommend re-starting PO iron pills.   # CKD- Creatinine 1.2 stable.   # follow up labs in 2 months-cbc;  follow up in 6 months [cbcbmp].     Cammie Sickle, MD 09/16/2017 11:56 AM

## 2017-09-13 NOTE — Assessment & Plan Note (Signed)
#   ITP-Steroid responsive. currently on surveillance. Recent relapsed in dec 2017-  S/p prednisone x 7 day; currently off steroids. today- 134. Continue surveillance at this time.   # IDA-  Mild anemia/likely from CKD- Hb 12.3improving- recommend re-starting PO iron pills.   # CKD- Creatinine 1.2 stable.   # follow up labs in 2 months-cbc;  follow up in 6 months [cbcbmp].

## 2017-10-03 ENCOUNTER — Other Ambulatory Visit: Payer: PPO

## 2017-10-10 ENCOUNTER — Ambulatory Visit: Payer: PPO | Admitting: Internal Medicine

## 2017-10-10 ENCOUNTER — Ambulatory Visit: Payer: PPO | Admitting: Podiatry

## 2017-10-10 ENCOUNTER — Encounter: Payer: Self-pay | Admitting: Podiatry

## 2017-10-10 ENCOUNTER — Ambulatory Visit: Payer: Self-pay

## 2017-10-10 DIAGNOSIS — Q828 Other specified congenital malformations of skin: Secondary | ICD-10-CM

## 2017-10-10 DIAGNOSIS — M779 Enthesopathy, unspecified: Principal | ICD-10-CM

## 2017-10-10 DIAGNOSIS — M778 Other enthesopathies, not elsewhere classified: Secondary | ICD-10-CM

## 2017-10-10 DIAGNOSIS — M7751 Other enthesopathy of right foot: Secondary | ICD-10-CM

## 2017-10-10 DIAGNOSIS — B351 Tinea unguium: Secondary | ICD-10-CM

## 2017-10-10 DIAGNOSIS — M79676 Pain in unspecified toe(s): Secondary | ICD-10-CM

## 2017-10-10 NOTE — Progress Notes (Signed)
  Subjective:  Patient ID: [name], male    DOB: 05/23/1932,  MRN: [id number] HPI Chief Complaint  Patient presents with  . Callouses    Callused areas bilateral - lateral 5th MPJ right (x1) and plantar forefoot left (x2), hurts to walk for long periods on right, requesting nail care too    83 y.o. male presents with the above complaint.     Past Medical History:  Diagnosis Date  . Allergic rhinitis   . Arthropathy   . B12 deficiency anemia   . Chronic neck pain   . CVA (cerebral infarction)   . GERD (gastroesophageal reflux disease)   . History of seizures   . HTN (hypertension)   . IDA (iron deficiency anemia)   . ITP (idiopathic thrombocytopenic purpura) 07/16/14     Current Outpatient Medications:  .  aspirin 325 MG tablet, Take 1 tablet by mouth daily., Disp: , Rfl:  .  RABEprazole (ACIPHEX) 20 MG tablet, Take 1 tablet by mouth daily. 1 hour before meals, Disp: , Rfl:   Allergies  Allergen Reactions  . Meperidine Hives   Review of Systems  All other systems reviewed and are negative.  Objective:  There were no vitals filed for this visit.  General: Well developed, nourished, in no acute distress, alert and oriented x3   Dermatological: Skin is warm, dry and supple bilateral. Nails x 10 are well maintained; remaining integument appears unremarkable at this time. There are no open sores, no preulcerative lesions, no rash or signs of infection present. Reactive hyperkeratotic lateral aspect of the fifth metatarsal right foot. No open lesions or wounds. Thick yellow dystrophic onychomycotic nails bilateral.  Vascular: Dorsalis Pedis artery and Posterior Tibial artery pedal pulses are 2/4 bilateral with immedate capillary fill time. Pedal hair growth present. No varicosities and no lower extremity edema present bilateral.   Neruologic: Grossly intact via light touch bilateral. Vibratory intact via tuning fork bilateral. Protective threshold with Semmes Wienstein  monofilament intact to all pedal sites bilateral. Patellar and Achilles deep tendon reflexes 2+ bilateral. No Babinski or clonus noted bilateral.   Musculoskeletal: No gross boney pedal deformities bilateral. No pain, crepitus, or limitation noted with foot and ankle range of motion bilateral. Muscular strength 5/5 in all groups tested bilateral. Capsulitis fifth metatarsal phalangeal joint of the right foot.  Gait: Unassisted, Nonantalgic.    Radiographs:  Demonstrate erectus foot type. No fractures.  Assessment & Plan:   Assessment: Capsulitis with poor keratosis lateral aspect fifth metatarsal head right foot. Pain in limb secondary to onychomycosis.  Plan: Injected the fifth metatarsophalangeal joint area after verbal consent. Injected with 2 mg of dexamethasone and 2-1/2 mg of Marcaine. Debrided his nails 1 through 5 bilateral secondary to pain. Also debrided overlying reactive hyperkeratotic fifth metatarsal. Discussed appropriate shoe gear and with him today stating that his fifth metatarsal is rubbing the shoe because he is putting a straight foot and occurred shoe he understands this and will look at other shoe gear.     [name] T. [place], [place]

## 2017-10-18 DIAGNOSIS — Z23 Encounter for immunization: Secondary | ICD-10-CM | POA: Diagnosis not present

## 2017-11-15 ENCOUNTER — Inpatient Hospital Stay: Payer: PPO | Attending: Internal Medicine

## 2017-11-15 DIAGNOSIS — D693 Immune thrombocytopenic purpura: Secondary | ICD-10-CM | POA: Insufficient documentation

## 2017-11-15 LAB — BASIC METABOLIC PANEL
Anion gap: 8 (ref 5–15)
BUN: 18 mg/dL (ref 6–20)
CALCIUM: 8.5 mg/dL — AB (ref 8.9–10.3)
CHLORIDE: 103 mmol/L (ref 101–111)
CO2: 26 mmol/L (ref 22–32)
Creatinine, Ser: 1.32 mg/dL — ABNORMAL HIGH (ref 0.61–1.24)
GFR calc non Af Amer: 48 mL/min — ABNORMAL LOW (ref >60)
GFR, EST AFRICAN AMERICAN: 56 mL/min — AB (ref >60)
Glucose, Bld: 118 mg/dL — ABNORMAL HIGH (ref 65–99)
POTASSIUM: 4.8 mmol/L (ref 3.5–5.1)
Sodium: 137 mmol/L (ref 135–145)

## 2017-11-15 LAB — IRON AND TIBC
Iron: 52 ug/dL (ref 45–182)
Saturation Ratios: 23 % (ref 17.9–39.5)
TIBC: 227 ug/dL — AB (ref 250–450)
UIBC: 175 ug/dL

## 2017-11-15 LAB — CBC WITH DIFFERENTIAL/PLATELET
BASOS ABS: 0 10*3/uL (ref 0–0.1)
BASOS PCT: 0 %
Eosinophils Absolute: 0.2 10*3/uL (ref 0–0.7)
Eosinophils Relative: 3 %
HEMATOCRIT: 36.1 % — AB (ref 40.0–52.0)
HEMOGLOBIN: 12.1 g/dL — AB (ref 13.0–18.0)
LYMPHS PCT: 13 %
Lymphs Abs: 1 10*3/uL (ref 1.0–3.6)
MCH: 29.7 pg (ref 26.0–34.0)
MCHC: 33.4 g/dL (ref 32.0–36.0)
MCV: 89 fL (ref 80.0–100.0)
Monocytes Absolute: 0.6 10*3/uL (ref 0.2–1.0)
Monocytes Relative: 8 %
NEUTROS ABS: 5.9 10*3/uL (ref 1.4–6.5)
NEUTROS PCT: 76 %
Platelets: 132 10*3/uL — ABNORMAL LOW (ref 150–440)
RBC: 4.06 MIL/uL — AB (ref 4.40–5.90)
RDW: 15.2 % — ABNORMAL HIGH (ref 11.5–14.5)
WBC: 7.7 10*3/uL (ref 3.8–10.6)

## 2017-11-15 LAB — FERRITIN: FERRITIN: 27 ng/mL (ref 24–336)

## 2017-11-26 DIAGNOSIS — J300 Vasomotor rhinitis: Secondary | ICD-10-CM | POA: Diagnosis not present

## 2017-11-26 DIAGNOSIS — H6123 Impacted cerumen, bilateral: Secondary | ICD-10-CM | POA: Diagnosis not present

## 2017-11-26 DIAGNOSIS — H903 Sensorineural hearing loss, bilateral: Secondary | ICD-10-CM | POA: Diagnosis not present

## 2017-11-26 DIAGNOSIS — H8010 Otosclerosis involving oval window, obliterative, unspecified ear: Secondary | ICD-10-CM | POA: Diagnosis not present

## 2017-12-10 DIAGNOSIS — H908 Mixed conductive and sensorineural hearing loss, unspecified: Secondary | ICD-10-CM | POA: Diagnosis not present

## 2017-12-12 DIAGNOSIS — K219 Gastro-esophageal reflux disease without esophagitis: Secondary | ICD-10-CM | POA: Diagnosis not present

## 2017-12-12 DIAGNOSIS — D519 Vitamin B12 deficiency anemia, unspecified: Secondary | ICD-10-CM | POA: Diagnosis not present

## 2017-12-12 DIAGNOSIS — G40802 Other epilepsy, not intractable, without status epilepticus: Secondary | ICD-10-CM | POA: Diagnosis not present

## 2017-12-12 DIAGNOSIS — D696 Thrombocytopenia, unspecified: Secondary | ICD-10-CM | POA: Diagnosis not present

## 2017-12-12 DIAGNOSIS — Z0000 Encounter for general adult medical examination without abnormal findings: Secondary | ICD-10-CM | POA: Diagnosis not present

## 2017-12-12 DIAGNOSIS — Z6827 Body mass index (BMI) 27.0-27.9, adult: Secondary | ICD-10-CM | POA: Diagnosis not present

## 2017-12-12 DIAGNOSIS — Z79899 Other long term (current) drug therapy: Secondary | ICD-10-CM | POA: Diagnosis not present

## 2017-12-12 DIAGNOSIS — L309 Dermatitis, unspecified: Secondary | ICD-10-CM | POA: Diagnosis not present

## 2018-03-12 DIAGNOSIS — D519 Vitamin B12 deficiency anemia, unspecified: Secondary | ICD-10-CM | POA: Diagnosis not present

## 2018-03-13 ENCOUNTER — Encounter: Payer: Self-pay | Admitting: Podiatry

## 2018-03-13 ENCOUNTER — Ambulatory Visit: Payer: PPO | Admitting: Podiatry

## 2018-03-13 DIAGNOSIS — M79676 Pain in unspecified toe(s): Secondary | ICD-10-CM

## 2018-03-13 DIAGNOSIS — B351 Tinea unguium: Secondary | ICD-10-CM | POA: Diagnosis not present

## 2018-03-13 DIAGNOSIS — Q828 Other specified congenital malformations of skin: Secondary | ICD-10-CM

## 2018-03-14 ENCOUNTER — Inpatient Hospital Stay: Payer: PPO | Admitting: Internal Medicine

## 2018-03-14 ENCOUNTER — Inpatient Hospital Stay: Payer: PPO | Attending: Internal Medicine

## 2018-03-14 VITALS — BP 106/74 | Resp 16 | Wt 178.2 lb

## 2018-03-14 DIAGNOSIS — D509 Iron deficiency anemia, unspecified: Secondary | ICD-10-CM

## 2018-03-14 DIAGNOSIS — N189 Chronic kidney disease, unspecified: Secondary | ICD-10-CM | POA: Insufficient documentation

## 2018-03-14 DIAGNOSIS — Z7982 Long term (current) use of aspirin: Secondary | ICD-10-CM | POA: Insufficient documentation

## 2018-03-14 DIAGNOSIS — I129 Hypertensive chronic kidney disease with stage 1 through stage 4 chronic kidney disease, or unspecified chronic kidney disease: Secondary | ICD-10-CM

## 2018-03-14 DIAGNOSIS — H6123 Impacted cerumen, bilateral: Secondary | ICD-10-CM | POA: Diagnosis not present

## 2018-03-14 DIAGNOSIS — D693 Immune thrombocytopenic purpura: Secondary | ICD-10-CM | POA: Diagnosis not present

## 2018-03-14 DIAGNOSIS — J300 Vasomotor rhinitis: Secondary | ICD-10-CM | POA: Diagnosis not present

## 2018-03-14 DIAGNOSIS — H908 Mixed conductive and sensorineural hearing loss, unspecified: Secondary | ICD-10-CM | POA: Diagnosis not present

## 2018-03-14 LAB — BASIC METABOLIC PANEL
Anion gap: 8 (ref 5–15)
BUN: 17 mg/dL (ref 6–20)
CHLORIDE: 102 mmol/L (ref 101–111)
CO2: 27 mmol/L (ref 22–32)
CREATININE: 1.26 mg/dL — AB (ref 0.61–1.24)
Calcium: 8.8 mg/dL — ABNORMAL LOW (ref 8.9–10.3)
GFR calc non Af Amer: 51 mL/min — ABNORMAL LOW (ref >60)
GFR, EST AFRICAN AMERICAN: 59 mL/min — AB (ref >60)
GLUCOSE: 116 mg/dL — AB (ref 65–99)
Potassium: 5 mmol/L (ref 3.5–5.1)
Sodium: 137 mmol/L (ref 135–145)

## 2018-03-14 LAB — CBC WITH DIFFERENTIAL/PLATELET
Basophils Absolute: 0 10*3/uL (ref 0–0.1)
Basophils Relative: 1 %
EOS ABS: 0.2 10*3/uL (ref 0–0.7)
Eosinophils Relative: 3 %
HEMATOCRIT: 37.3 % — AB (ref 40.0–52.0)
HEMOGLOBIN: 12.7 g/dL — AB (ref 13.0–18.0)
LYMPHS ABS: 0.9 10*3/uL — AB (ref 1.0–3.6)
Lymphocytes Relative: 13 %
MCH: 30.4 pg (ref 26.0–34.0)
MCHC: 34.1 g/dL (ref 32.0–36.0)
MCV: 89.1 fL (ref 80.0–100.0)
MONOS PCT: 9 %
Monocytes Absolute: 0.6 10*3/uL (ref 0.2–1.0)
NEUTROS PCT: 74 %
Neutro Abs: 4.9 10*3/uL (ref 1.4–6.5)
PLATELETS: 152 10*3/uL (ref 150–440)
RBC: 4.19 MIL/uL — ABNORMAL LOW (ref 4.40–5.90)
RDW: 15.2 % — AB (ref 11.5–14.5)
WBC: 6.6 10*3/uL (ref 3.8–10.6)

## 2018-03-14 LAB — IRON AND TIBC
Iron: 54 ug/dL (ref 45–182)
Saturation Ratios: 24 % (ref 17.9–39.5)
TIBC: 224 ug/dL — AB (ref 250–450)
UIBC: 170 ug/dL

## 2018-03-14 LAB — FERRITIN: Ferritin: 31 ng/mL (ref 24–336)

## 2018-03-14 MED ORDER — TAMSULOSIN HCL 0.4 MG PO CAPS: 0.4000 mg | ORAL_CAPSULE | Freq: Every day | ORAL | 4 refills | Status: DC

## 2018-03-14 NOTE — Progress Notes (Signed)
[place] OFFICE PROGRESS NOTE  Patient Care Team: Lynnell Jude, MD as PCP - General (Family Medicine)   SUMMARY OF ONCOLOGIC HISTORY:  # SEP 2015- ITP [platelets- 16]; s/p prednisone ; Good Response; currently surveillance  # SEP 2015- Mild Anemia- ~12 s/p EGD/colo [Oct 2015; Dr.Skulskie] ; Ferritin- 17; NO BMBx; May 2017- Start PO iron  # CKD creatinine ~1.4 [no nephro]   INTERVAL HISTORY:  84 year old male patient with history of ITP-is here for follow-up/currently on surveillance.   Patient complains of difficulty with urination-urgency poor stream; multiple visits to the bathroom at nighttime.  Patient denies any shortness of breath or cough.  Denies any easy bruising.  Denies any black-colored stools or blood in stools.  He is not on iron pills.  He is having some difficulty with hearing is awaiting an ENT evaluation today.  REVIEW OF SYSTEMS:  A complete 10 point review of system is done which is negative except mentioned above/history of present illness.   PAST MEDICAL HISTORY :  Past Medical History:  Diagnosis Date  . Allergic rhinitis   . Arthropathy   . B12 deficiency anemia   . Chronic neck pain   . CVA (cerebral infarction)   . GERD (gastroesophageal reflux disease)   . History of seizures   . HTN (hypertension)   . IDA (iron deficiency anemia)   . ITP (idiopathic thrombocytopenic purpura) 07/16/14    PAST SURGICAL HISTORY :   Past Surgical History:  Procedure Laterality Date  . CHOLECYSTECTOMY  10/13/1988  . COLONOSCOPY  10/2014, 08/2014   Dr. Gwenlyn Perking  . UPPER GI ENDOSCOPY      FAMILY HISTORY :   Family History  Problem Relation Age of Onset  . Heart disease Unknown   . Cirrhosis Mother     SOCIAL HISTORY:   Social History   Tobacco Use  . Smoking status: Never Smoker  . Smokeless tobacco: Never Used  Substance Use Topics  . Alcohol use: No    Alcohol/week: 0.0 oz  . Drug use: No    ALLERGIES:  is allergic to  meperidine.  MEDICATIONS:  Current Outpatient Medications  Medication Sig Dispense Refill  . aspirin 325 MG tablet Take 1 tablet by mouth daily.    . RABEprazole (ACIPHEX) 20 MG tablet Take 1 tablet by mouth daily. 1 hour before meals    . tamsulosin (FLOMAX) 0.4 MG CAPS capsule Take 1 capsule (0.4 mg total) by mouth daily after supper. 30 capsule 4   No current facility-administered medications for this visit.     PHYSICAL EXAMINATION:   BP 106/74 (BP Location: Left Arm, Patient Position: Sitting)   Resp 16   Wt 178 lb 3.2 oz (80.8 kg)   BMI 25.57 kg/m   Filed Weights   03/14/18 1151  Weight: 178 lb 3.2 oz (80.8 kg)    GENERAL: Elderly Caucasian male patient who appears younger than his stated age; Alert, no distress and comfortable.  Accompanied by his wife. EYES: no pallor or icterus OROPHARYNX: no thrush or ulceration; good dentition  NECK: supple, no masses felt LYMPH:  no palpable lymphadenopathy in the cervical, axillary or inguinal regions LUNGS: clear to auscultation and  No wheeze or crackles HEART/CVS: regular rate & rhythm and no murmurs; No lower extremity edema ABDOMEN:abdomen soft, non-tender and normal bowel sounds Musculoskeletal:no cyanosis of digits and no clubbing  PSYCH: alert & oriented x 3 with fluent speech NEURO: no focal motor/sensory deficits SKIN: Chronic ecchymosis noted.  LABORATORY DATA:  I have reviewed the data as listed    Component Value Date/Time   NA 137 03/14/2018 1130   NA 137 02/24/2015 1131   K 5.0 03/14/2018 1130   K 4.1 02/24/2015 1131   CL 102 03/14/2018 1130   CL 103 02/24/2015 1131   CO2 27 03/14/2018 1130   CO2 29 02/24/2015 1131   GLUCOSE 116 (H) 03/14/2018 1130   GLUCOSE 135 (H) 02/24/2015 1131   BUN 17 03/14/2018 1130   BUN 14 02/24/2015 1131   CREATININE 1.26 (H) 03/14/2018 1130   CREATININE 1.32 (H) 02/24/2015 1131   CALCIUM 8.8 (L) 03/14/2018 1130   CALCIUM 8.8 (L) 02/24/2015 1131   PROT 6.4 (L) 11/03/2016  1100   PROT 6.5 02/24/2015 1131   ALBUMIN 3.7 11/03/2016 1100   ALBUMIN 3.9 02/24/2015 1131   AST 21 11/03/2016 1100   AST 19 02/24/2015 1131   ALT 11 (L) 11/03/2016 1100   ALT 11 (L) 02/24/2015 1131   ALKPHOS 50 11/03/2016 1100   ALKPHOS 43 02/24/2015 1131   BILITOT 0.6 11/03/2016 1100   BILITOT 0.9 02/24/2015 1131   GFRNONAA 51 (L) 03/14/2018 1130   GFRNONAA 50 (L) 02/24/2015 1131   GFRAA 59 (L) 03/14/2018 1130   GFRAA 58 (L) 02/24/2015 1131    No results found for: SPEP, UPEP  Lab Results  Component Value Date   WBC 6.6 03/14/2018   NEUTROABS 4.9 03/14/2018   HGB 12.7 (L) 03/14/2018   HCT 37.3 (L) 03/14/2018   MCV 89.1 03/14/2018   PLT 152 03/14/2018      Chemistry      Component Value Date/Time   NA 137 03/14/2018 1130   NA 137 02/24/2015 1131   K 5.0 03/14/2018 1130   K 4.1 02/24/2015 1131   CL 102 03/14/2018 1130   CL 103 02/24/2015 1131   CO2 27 03/14/2018 1130   CO2 29 02/24/2015 1131   BUN 17 03/14/2018 1130   BUN 14 02/24/2015 1131   CREATININE 1.26 (H) 03/14/2018 1130   CREATININE 1.32 (H) 02/24/2015 1131      Component Value Date/Time   CALCIUM 8.8 (L) 03/14/2018 1130   CALCIUM 8.8 (L) 02/24/2015 1131   ALKPHOS 50 11/03/2016 1100   ALKPHOS 43 02/24/2015 1131   AST 21 11/03/2016 1100   AST 19 02/24/2015 1131   ALT 11 (L) 11/03/2016 1100   ALT 11 (L) 02/24/2015 1131   BILITOT 0.6 11/03/2016 1100   BILITOT 0.9 02/24/2015 1131        ASSESSMENT & PLAN:   Idiopathic thrombocytopenic purpura ([place]) # ITP-Steroid responsive.  Currently on surveillance.  Platelets today are 154.    # IDA-  Mild anemia/likely from CKD- Hb 12.3improving.  # symptoms of prostatism symptoms- reluctant to checked PSA today [not sure if he had his PSA checked recently]; discussed re: flomax; new script given.  Discussed the potential side effects including orthostatic hypotension/strategies to avoid falls.  I asked him to talk to his PCP regarding checking the PSA-if  not recently checked.  # CKD- Creatinine 1.2 stable.   #  follow up in 6 months [cbcbmp/PSA];   Dr.Bliss    Cammie Sickle, MD 03/14/2018 2:48 PM

## 2018-03-14 NOTE — Progress Notes (Signed)
Subjective: 84 y.o. returns the office today for painful, elongated, thickened toenails which he cannot trim himself and for painful calluses to both of his feet. Denies any redness or drainage around the nails. Denies any acute changes since last appointment and no new complaints today. Denies any systemic complaints such as fevers, chills, nausea, vomiting.   PCP: Lynnell Jude, MD   Objective: AAO 3, NAD DP/PT pulses palpable, CRT less than 3 seconds Nails hypertrophic, dystrophic, elongated, brittle, discolored 10. There is tenderness overlying the nails 1-5 bilaterally. There is no surrounding erythema or drainage along the nail sites. Hyperkeratotic lesion right fifth metatarsal head as well as 2 small punctate annular hyperkeratotic lesion submetatarsal area on the left foot.  Upon debridement there is no underlying ulceration, drainage or any clinical signs of infection noted today. No open lesions or pre-ulcerative lesions are identified. No other areas of tenderness bilateral lower extremities. No overlying edema, erythema, increased warmth. No pain with calf compression, swelling, warmth, erythema.  Assessment: Patient presents with symptomatic onychomycosis; porokeratosis  Plan: -Treatment options including alternatives, risks, complications were discussed -Nails sharply debrided 10 without complication/bleeding. -Lesions were sharply debrided x3 without any complications or bleeding.  Offloading pads were dispensed. -Discussed daily foot inspection. If there are any changes, to call the office immediately.  -Follow-up in 3 months or sooner if any problems are to arise. In the meantime, encouraged to call the office with any questions, concerns, changes symptoms.  Celesta Gentile, DPM

## 2018-03-14 NOTE — Assessment & Plan Note (Addendum)
#   ITP-Steroid responsive.  Currently on surveillance.  Platelets today are 154.    # IDA-  Mild anemia/likely from CKD- Hb 12.3improving.  # symptoms of prostatism symptoms- reluctant to checked PSA today [not sure if he had his PSA checked recently]; discussed re: flomax; new script given.  Discussed the potential side effects including orthostatic hypotension/strategies to avoid falls.  I asked him to talk to his PCP regarding checking the PSA-if not recently checked.  # CKD- Creatinine 1.2 stable.   #  follow up in 6 months [cbcbmp/PSA];   Dr.Bliss

## 2018-05-01 DIAGNOSIS — H2513 Age-related nuclear cataract, bilateral: Secondary | ICD-10-CM | POA: Diagnosis not present

## 2018-06-13 ENCOUNTER — Ambulatory Visit: Payer: PPO | Admitting: Podiatry

## 2018-06-13 ENCOUNTER — Encounter: Payer: Self-pay | Admitting: Podiatry

## 2018-06-13 DIAGNOSIS — M779 Enthesopathy, unspecified: Secondary | ICD-10-CM

## 2018-06-13 DIAGNOSIS — B351 Tinea unguium: Secondary | ICD-10-CM | POA: Diagnosis not present

## 2018-06-13 DIAGNOSIS — Q828 Other specified congenital malformations of skin: Secondary | ICD-10-CM

## 2018-06-13 DIAGNOSIS — M79676 Pain in unspecified toe(s): Secondary | ICD-10-CM | POA: Diagnosis not present

## 2018-06-13 DIAGNOSIS — M778 Other enthesopathies, not elsewhere classified: Secondary | ICD-10-CM

## 2018-06-13 NOTE — Progress Notes (Signed)
Complaint:  Visit Type: Patient returns to my office for continued preventative foot care services. Complaint: Patient states" my nails have grown long and thick and become painful to walk and wear shoes"  Patient also has painful callus left foot and painful site on the outside bottom of right foot.  The patient presents for preventative foot care services. No changes to ROS  Podiatric Exam: Vascular: dorsalis pedis and posterior tibial pulses are palpable bilateral. Capillary return is immediate. Temperature gradient is WNL. Skin turgor WNL  Sensorium: Normal Semmes Weinstein monofilament test. Normal tactile sensation bilaterally. Nail Exam: Pt has thick disfigured discolored nails with subungual debris noted bilateral entire nail hallux through fifth toenails Ulcer Exam: There is no evidence of ulcer or pre-ulcerative changes or infection. Orthopedic Exam: Muscle tone and strength are WNL. No limitations in general ROM. No crepitus or effusions noted. Foot type and digits show no abnormalities. Plantarflexed fifth met right foot. Skin:  Porokeratosis  Sub 2 left foot.. No infection or ulcers  Diagnosis:  Onychomycosis, , Pain in right toe, pain in left toes  Porokeratosis left foot.  Capsulitis 5th met right foot.  Treatment & Plan Procedures and Treatment: Consent by patient was obtained for treatment procedures.   Debridement of mycotic and hypertrophic toenails, 1 through 5 bilateral and clearing of subungual debris. No ulceration, no infection noted. Padding fifth met. right foot.  Debride porokeratosis left foot. Return Visit-Office Procedure: Patient instructed to return to the office for a follow up visit 10 weeks  for continued evaluation and treatment.    Gardiner Barefoot DPM

## 2018-06-18 DIAGNOSIS — H40003 Preglaucoma, unspecified, bilateral: Secondary | ICD-10-CM | POA: Diagnosis not present

## 2018-06-26 DIAGNOSIS — H40003 Preglaucoma, unspecified, bilateral: Secondary | ICD-10-CM | POA: Diagnosis not present

## 2018-08-13 DIAGNOSIS — J206 Acute bronchitis due to rhinovirus: Secondary | ICD-10-CM | POA: Diagnosis not present

## 2018-08-13 DIAGNOSIS — Z6826 Body mass index (BMI) 26.0-26.9, adult: Secondary | ICD-10-CM | POA: Diagnosis not present

## 2018-09-13 ENCOUNTER — Other Ambulatory Visit: Payer: Self-pay

## 2018-09-13 ENCOUNTER — Inpatient Hospital Stay: Payer: PPO | Attending: Internal Medicine

## 2018-09-13 ENCOUNTER — Inpatient Hospital Stay (HOSPITAL_BASED_OUTPATIENT_CLINIC_OR_DEPARTMENT_OTHER): Payer: PPO | Admitting: Internal Medicine

## 2018-09-13 ENCOUNTER — Encounter: Payer: Self-pay | Admitting: Internal Medicine

## 2018-09-13 VITALS — BP 175/79 | HR 51 | Temp 98.0°F | Resp 18 | Wt 172.0 lb

## 2018-09-13 DIAGNOSIS — D649 Anemia, unspecified: Secondary | ICD-10-CM

## 2018-09-13 DIAGNOSIS — Z7982 Long term (current) use of aspirin: Secondary | ICD-10-CM

## 2018-09-13 DIAGNOSIS — D693 Immune thrombocytopenic purpura: Secondary | ICD-10-CM

## 2018-09-13 DIAGNOSIS — R634 Abnormal weight loss: Secondary | ICD-10-CM | POA: Insufficient documentation

## 2018-09-13 DIAGNOSIS — N183 Chronic kidney disease, stage 3 (moderate): Secondary | ICD-10-CM | POA: Insufficient documentation

## 2018-09-13 DIAGNOSIS — N400 Enlarged prostate without lower urinary tract symptoms: Secondary | ICD-10-CM | POA: Insufficient documentation

## 2018-09-13 DIAGNOSIS — R350 Frequency of micturition: Secondary | ICD-10-CM

## 2018-09-13 LAB — IRON AND TIBC
IRON: 27 ug/dL — AB (ref 45–182)
Saturation Ratios: 11 % — ABNORMAL LOW (ref 17.9–39.5)
TIBC: 239 ug/dL — ABNORMAL LOW (ref 250–450)
UIBC: 212 ug/dL

## 2018-09-13 LAB — PSA: Prostatic Specific Antigen: 1.2 ng/mL (ref 0.00–4.00)

## 2018-09-13 LAB — CBC WITH DIFFERENTIAL/PLATELET
ABS IMMATURE GRANULOCYTES: 0.03 10*3/uL (ref 0.00–0.07)
Basophils Absolute: 0 10*3/uL (ref 0.0–0.1)
Basophils Relative: 0 %
Eosinophils Absolute: 0.2 10*3/uL (ref 0.0–0.5)
Eosinophils Relative: 3 %
HCT: 36.5 % — ABNORMAL LOW (ref 39.0–52.0)
Hemoglobin: 11.9 g/dL — ABNORMAL LOW (ref 13.0–17.0)
IMMATURE GRANULOCYTES: 0 %
Lymphocytes Relative: 13 %
Lymphs Abs: 0.9 10*3/uL (ref 0.7–4.0)
MCH: 29.8 pg (ref 26.0–34.0)
MCHC: 32.6 g/dL (ref 30.0–36.0)
MCV: 91.3 fL (ref 80.0–100.0)
MONO ABS: 0.7 10*3/uL (ref 0.1–1.0)
Monocytes Relative: 10 %
NEUTROS ABS: 5.1 10*3/uL (ref 1.7–7.7)
Neutrophils Relative %: 74 %
Platelets: 136 10*3/uL — ABNORMAL LOW (ref 150–400)
RBC: 4 MIL/uL — AB (ref 4.22–5.81)
RDW: 14.5 % (ref 11.5–15.5)
WBC: 6.9 10*3/uL (ref 4.0–10.5)
nRBC: 0 % (ref 0.0–0.2)

## 2018-09-13 LAB — BASIC METABOLIC PANEL
Anion gap: 6 (ref 5–15)
BUN: 19 mg/dL (ref 8–23)
CO2: 28 mmol/L (ref 22–32)
Calcium: 8.7 mg/dL — ABNORMAL LOW (ref 8.9–10.3)
Chloride: 103 mmol/L (ref 98–111)
Creatinine, Ser: 1.35 mg/dL — ABNORMAL HIGH (ref 0.61–1.24)
GFR calc Af Amer: 54 mL/min — ABNORMAL LOW (ref >60)
GFR calc non Af Amer: 47 mL/min — ABNORMAL LOW (ref >60)
GLUCOSE: 97 mg/dL (ref 70–99)
POTASSIUM: 4.6 mmol/L (ref 3.5–5.1)
Sodium: 137 mmol/L (ref 135–145)

## 2018-09-13 LAB — FERRITIN: Ferritin: 27 ng/mL (ref 24–336)

## 2018-09-13 NOTE — Assessment & Plan Note (Addendum)
#   ITP-Steroid responsive.  Currently on surveillance.  Platelets of 136.  Continue surveillance.  Asymptomatic.  # IDA-  Mild anemia/likely from CKD- Hb 11.9; recommend PO iron.  Check iron studies ferritin.  # CKD-stage III- Stable.   # weight loss- 6 pounds unconventional; ? Imaging. Wife; concerned. Pt declines; will call if worse for imaging.   # Symptoms of prostatism-Discussed re: flomax;  Await  PSA today.   #  DISPOSITION:  # follow up in 6 months [cbcbmp/]- Dr.B will call if PSA is elevated/abnormal.  Dr.Bliss

## 2018-09-13 NOTE — Progress Notes (Signed)
[place] OFFICE PROGRESS NOTE  Patient Care Team: Lynnell Jude, MD as PCP - General (Family Medicine)   SUMMARY OF ONCOLOGIC HISTORY:  # SEP 2015- ITP [platelets- 16]; s/p prednisone ; Good Response; currently surveillance  # SEP 2015- Mild Anemia- ~12 s/p EGD/colo [Oct 2015; Dr.Skulskie] ; Ferritin- 17; NO BMBx;   # CKD creatinine ~1.4 [no nephro]   INTERVAL HISTORY:  84 year old male patient with history of ITP-is here for follow-up/currently on surveillance.  Patient continues to be fairly active.  He continues to drive to [place] daily basis.  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  Continues to have increased frequency of urination poor stream.  Did not take Flomax as recommended last visit.  In the interim had a recent episode of acute bronchitis status post antibiotics.   Patient complains of difficulty with urination-urgency poor stream; multiple visits to the bathroom at nighttime.    He complains of weight loss; unintentional 6 to 10 pounds in the last 6 months..  Appetite is good..  Review of Systems  Constitutional: Positive for weight loss. Negative for chills, diaphoresis, fever and malaise/fatigue.  HENT: Negative for nosebleeds and sore throat.   Eyes: Negative for double vision.  Respiratory: Negative for cough, hemoptysis, sputum production, shortness of breath and wheezing.   Cardiovascular: Negative for chest pain, palpitations, orthopnea and leg swelling.  Gastrointestinal: Negative for abdominal pain, blood in stool, constipation, diarrhea, heartburn, melena, nausea and vomiting.  Genitourinary: Positive for frequency and urgency. Negative for dysuria.  Musculoskeletal: Negative for back pain and joint pain.  Skin: Negative.  Negative for itching and rash.  Neurological: Negative for dizziness, tingling, focal weakness, weakness and headaches.  Endo/Heme/Allergies: Does not bruise/bleed easily.  Psychiatric/Behavioral: Negative for depression.  The patient is not nervous/anxious and does not have insomnia.      PAST MEDICAL HISTORY :  Past Medical History:  Diagnosis Date  . Allergic rhinitis   . Arthropathy   . B12 deficiency anemia   . Chronic neck pain   . CVA (cerebral infarction)   . GERD (gastroesophageal reflux disease)   . History of seizures   . HTN (hypertension)   . IDA (iron deficiency anemia)   . ITP (idiopathic thrombocytopenic purpura) 07/16/14    PAST SURGICAL HISTORY :   Past Surgical History:  Procedure Laterality Date  . CHOLECYSTECTOMY  10/13/1988  . COLONOSCOPY  10/2014, 08/2014   Dr. Gwenlyn Perking  . UPPER GI ENDOSCOPY      FAMILY HISTORY :   Family History  Problem Relation Age of Onset  . Heart disease Unknown   . Cirrhosis Mother     SOCIAL HISTORY:   Social History   Tobacco Use  . Smoking status: Never Smoker  . Smokeless tobacco: Never Used  Substance Use Topics  . Alcohol use: No    Alcohol/week: 0.0 standard drinks  . Drug use: No    ALLERGIES:  is allergic to meperidine.  MEDICATIONS:  Current Outpatient Medications  Medication Sig Dispense Refill  . aspirin 325 MG tablet Take 1 tablet by mouth daily.    . RABEprazole (ACIPHEX) 20 MG tablet Take 1 tablet by mouth daily. 1 hour before meals     No current facility-administered medications for this visit.     PHYSICAL EXAMINATION:   BP (!) 175/79 (BP Location: Left Arm, Patient Position: Sitting)   Pulse (!) 51   Temp 98 F (36.7 C) (Tympanic)   Resp 18   Wt 172 lb (  78 kg)   BMI 24.68 kg/m   Filed Weights   09/13/18 1059  Weight: 172 lb (78 kg)    Physical Exam  Constitutional: He is oriented to person, place, and time and well-developed, well-nourished, and in no distress.  Accompanied by his wife.  He is walking himself.  HENT:  Head: Normocephalic and atraumatic.  Mouth/Throat: Oropharynx is clear and moist. No oropharyngeal exudate.  Eyes: Pupils are equal, round, and reactive to light.  Neck: Normal  range of motion. Neck supple.  Cardiovascular: Normal rate and regular rhythm.  Pulmonary/Chest: No respiratory distress. He has no wheezes.  Abdominal: Soft. Bowel sounds are normal. He exhibits no distension and no mass. There is no tenderness. There is no rebound and no guarding.  Musculoskeletal: Normal range of motion. He exhibits no edema or tenderness.  Neurological: He is alert and oriented to person, place, and time.  Skin: Skin is warm.  Psychiatric: Affect normal.    LABORATORY DATA:  I have reviewed the data as listed    Component Value Date/Time   NA 137 09/13/2018 1023   NA 137 02/24/2015 1131   K 4.6 09/13/2018 1023   K 4.1 02/24/2015 1131   CL 103 09/13/2018 1023   CL 103 02/24/2015 1131   CO2 28 09/13/2018 1023   CO2 29 02/24/2015 1131   GLUCOSE 97 09/13/2018 1023   GLUCOSE 135 (H) 02/24/2015 1131   BUN 19 09/13/2018 1023   BUN 14 02/24/2015 1131   CREATININE 1.35 (H) 09/13/2018 1023   CREATININE 1.32 (H) 02/24/2015 1131   CALCIUM 8.7 (L) 09/13/2018 1023   CALCIUM 8.8 (L) 02/24/2015 1131   PROT 6.4 (L) 11/03/2016 1100   PROT 6.5 02/24/2015 1131   ALBUMIN 3.7 11/03/2016 1100   ALBUMIN 3.9 02/24/2015 1131   AST 21 11/03/2016 1100   AST 19 02/24/2015 1131   ALT 11 (L) 11/03/2016 1100   ALT 11 (L) 02/24/2015 1131   ALKPHOS 50 11/03/2016 1100   ALKPHOS 43 02/24/2015 1131   BILITOT 0.6 11/03/2016 1100   BILITOT 0.9 02/24/2015 1131   GFRNONAA 47 (L) 09/13/2018 1023   GFRNONAA 50 (L) 02/24/2015 1131   GFRAA 54 (L) 09/13/2018 1023   GFRAA 58 (L) 02/24/2015 1131    No results found for: SPEP, UPEP  Lab Results  Component Value Date   WBC 6.9 09/13/2018   NEUTROABS 5.1 09/13/2018   HGB 11.9 (L) 09/13/2018   HCT 36.5 (L) 09/13/2018   MCV 91.3 09/13/2018   PLT 136 (L) 09/13/2018      Chemistry      Component Value Date/Time   NA 137 09/13/2018 1023   NA 137 02/24/2015 1131   K 4.6 09/13/2018 1023   K 4.1 02/24/2015 1131   CL 103 09/13/2018 1023    CL 103 02/24/2015 1131   CO2 28 09/13/2018 1023   CO2 29 02/24/2015 1131   BUN 19 09/13/2018 1023   BUN 14 02/24/2015 1131   CREATININE 1.35 (H) 09/13/2018 1023   CREATININE 1.32 (H) 02/24/2015 1131      Component Value Date/Time   CALCIUM 8.7 (L) 09/13/2018 1023   CALCIUM 8.8 (L) 02/24/2015 1131   ALKPHOS 50 11/03/2016 1100   ALKPHOS 43 02/24/2015 1131   AST 21 11/03/2016 1100   AST 19 02/24/2015 1131   ALT 11 (L) 11/03/2016 1100   ALT 11 (L) 02/24/2015 1131   BILITOT 0.6 11/03/2016 1100   BILITOT 0.9 02/24/2015 1131  ASSESSMENT & PLAN:   Idiopathic thrombocytopenic purpura ([place]) # ITP-Steroid responsive.  Currently on surveillance.  Platelets of 136.  Continue surveillance.  Asymptomatic.  # IDA-  Mild anemia/likely from CKD- Hb 11.9; recommend PO iron.  Check iron studies ferritin.  # CKD-stage III- Stable.   # weight loss- 6 pounds unconventional; ? Imaging. Wife; concerned. Pt declines; will call if worse for imaging.   # Symptoms of prostatism-Discussed re: flomax;  Await  PSA today.   #  DISPOSITION:  # follow up in 6 months [cbcbmp/]- Dr.B will call if PSA is elevated/abnormal.  Dr.Bliss    Cammie Sickle, MD 09/13/2018 11:49 AM

## 2018-09-13 NOTE — Patient Instructions (Signed)
#   oral Iron/over the counter one a day.

## 2018-09-13 NOTE — Progress Notes (Signed)
Pt in for follow up, denies any difficulties or concerns today. 

## 2018-09-17 DIAGNOSIS — Z23 Encounter for immunization: Secondary | ICD-10-CM | POA: Diagnosis not present

## 2019-03-12 ENCOUNTER — Encounter (INDEPENDENT_AMBULATORY_CARE_PROVIDER_SITE_OTHER): Payer: Self-pay

## 2019-03-12 ENCOUNTER — Encounter: Payer: Self-pay | Admitting: Internal Medicine

## 2019-03-12 ENCOUNTER — Other Ambulatory Visit: Payer: Self-pay

## 2019-03-12 ENCOUNTER — Inpatient Hospital Stay: Payer: PPO | Attending: Internal Medicine | Admitting: Internal Medicine

## 2019-03-12 ENCOUNTER — Inpatient Hospital Stay: Payer: PPO

## 2019-03-12 VITALS — BP 124/78 | HR 69 | Temp 97.9°F | Resp 20 | Ht 70.0 in | Wt 172.0 lb

## 2019-03-12 DIAGNOSIS — Z8249 Family history of ischemic heart disease and other diseases of the circulatory system: Secondary | ICD-10-CM | POA: Diagnosis not present

## 2019-03-12 DIAGNOSIS — Z92241 Personal history of systemic steroid therapy: Secondary | ICD-10-CM | POA: Insufficient documentation

## 2019-03-12 DIAGNOSIS — D693 Immune thrombocytopenic purpura: Secondary | ICD-10-CM

## 2019-03-12 DIAGNOSIS — D508 Other iron deficiency anemias: Secondary | ICD-10-CM | POA: Insufficient documentation

## 2019-03-12 DIAGNOSIS — Z8379 Family history of other diseases of the digestive system: Secondary | ICD-10-CM | POA: Diagnosis not present

## 2019-03-12 DIAGNOSIS — Z885 Allergy status to narcotic agent status: Secondary | ICD-10-CM | POA: Insufficient documentation

## 2019-03-12 DIAGNOSIS — N183 Chronic kidney disease, stage 3 (moderate): Secondary | ICD-10-CM

## 2019-03-12 LAB — BASIC METABOLIC PANEL
Anion gap: 5 (ref 5–15)
BUN: 18 mg/dL (ref 8–23)
CO2: 29 mmol/L (ref 22–32)
Calcium: 8.3 mg/dL — ABNORMAL LOW (ref 8.9–10.3)
Chloride: 105 mmol/L (ref 98–111)
Creatinine, Ser: 1.35 mg/dL — ABNORMAL HIGH (ref 0.61–1.24)
GFR calc Af Amer: 55 mL/min — ABNORMAL LOW (ref >60)
GFR calc non Af Amer: 48 mL/min — ABNORMAL LOW (ref >60)
Glucose, Bld: 101 mg/dL — ABNORMAL HIGH (ref 70–99)
Potassium: 4.4 mmol/L (ref 3.5–5.1)
Sodium: 139 mmol/L (ref 135–145)

## 2019-03-12 LAB — CBC WITH DIFFERENTIAL/PLATELET
Abs Immature Granulocytes: 0.02 10*3/uL (ref 0.00–0.07)
Basophils Absolute: 0 10*3/uL (ref 0.0–0.1)
Basophils Relative: 0 %
Eosinophils Absolute: 0.2 10*3/uL (ref 0.0–0.5)
Eosinophils Relative: 3 %
HCT: 35.5 % — ABNORMAL LOW (ref 39.0–52.0)
Hemoglobin: 11.7 g/dL — ABNORMAL LOW (ref 13.0–17.0)
Immature Granulocytes: 0 %
Lymphocytes Relative: 16 %
Lymphs Abs: 1.3 10*3/uL (ref 0.7–4.0)
MCH: 29.9 pg (ref 26.0–34.0)
MCHC: 33 g/dL (ref 30.0–36.0)
MCV: 90.8 fL (ref 80.0–100.0)
Monocytes Absolute: 0.7 10*3/uL (ref 0.1–1.0)
Monocytes Relative: 9 %
Neutro Abs: 5.6 10*3/uL (ref 1.7–7.7)
Neutrophils Relative %: 72 %
Platelets: 148 10*3/uL — ABNORMAL LOW (ref 150–400)
RBC: 3.91 MIL/uL — ABNORMAL LOW (ref 4.22–5.81)
RDW: 14.4 % (ref 11.5–15.5)
WBC: 7.9 10*3/uL (ref 4.0–10.5)
nRBC: 0 % (ref 0.0–0.2)

## 2019-03-12 NOTE — Assessment & Plan Note (Addendum)
#   ITP-Steroid responsive.  Currently on surveillance.  Platelets of 148. Continue surveillance.   # IDA-  Mild anemia/likely from CKD- Hb 11.7recommend PO iron.  STABLE.   # CKD-stage III- STABLE.     # weight loss- improved/ stable.   #  DISPOSITION:  # follow up in 6 months [cbcbmp/]- Dr.B  Dr.Bliss

## 2019-03-12 NOTE — Progress Notes (Signed)
[place] OFFICE PROGRESS NOTE  Patient Care Team: Lynnell Jude, MD as PCP - General (Family Medicine)   SUMMARY OF ONCOLOGIC HISTORY:  # SEP 2015- ITP [platelets- 16]; s/p prednisone ; Good Response; currently surveillance  # SEP 2015- Mild Anemia- ~12 s/p EGD/colo [Oct 2015; Dr.Skulskie] ; Ferritin- 17; NO BMBx;   # CKD creatinine ~1.4 [no nephro]   INTERVAL HISTORY:  85 year old male patient with history of ITP-is here for follow-up/currently on surveillance.  Patient denies any easy bruising or bleeding.  No nausea no vomiting no blood in stools or black or stools.   Review of Systems  Constitutional: Negative for chills, diaphoresis, fever and malaise/fatigue.  HENT: Negative for nosebleeds and sore throat.   Eyes: Negative for double vision.  Respiratory: Negative for cough, hemoptysis, sputum production, shortness of breath and wheezing.   Cardiovascular: Negative for chest pain, palpitations, orthopnea and leg swelling.  Gastrointestinal: Negative for abdominal pain, blood in stool, constipation, diarrhea, heartburn, melena, nausea and vomiting.  Genitourinary: Negative for dysuria.  Musculoskeletal: Negative for back pain and joint pain.  Skin: Negative.  Negative for itching and rash.  Neurological: Negative for dizziness, tingling, focal weakness, weakness and headaches.  Endo/Heme/Allergies: Does not bruise/bleed easily.  Psychiatric/Behavioral: Negative for depression. The patient is not nervous/anxious and does not have insomnia.      PAST MEDICAL HISTORY :  Past Medical History:  Diagnosis Date  . Allergic rhinitis   . Arthropathy   . B12 deficiency anemia   . Chronic neck pain   . CVA (cerebral infarction)   . GERD (gastroesophageal reflux disease)   . History of seizures   . HTN (hypertension)   . IDA (iron deficiency anemia)   . ITP (idiopathic thrombocytopenic purpura) 07/16/14    PAST SURGICAL HISTORY :   Past Surgical History:   Procedure Laterality Date  . CHOLECYSTECTOMY  10/13/1988  . COLONOSCOPY  10/2014, 08/2014   Dr. Gwenlyn Perking  . UPPER GI ENDOSCOPY      FAMILY HISTORY :   Family History  Problem Relation Age of Onset  . Heart disease Unknown   . Cirrhosis Mother     SOCIAL HISTORY:   Social History   Tobacco Use  . Smoking status: Never Smoker  . Smokeless tobacco: Never Used  Substance Use Topics  . Alcohol use: No    Alcohol/week: 0.0 standard drinks  . Drug use: No    ALLERGIES:  is allergic to meperidine.  MEDICATIONS:  Current Outpatient Medications  Medication Sig Dispense Refill  . aspirin 325 MG tablet Take 1 tablet by mouth daily.    . RABEprazole (ACIPHEX) 20 MG tablet Take 1 tablet by mouth daily. 1 hour before meals    . tamsulosin (FLOMAX) 0.4 MG CAPS capsule Take 0.4 mg by mouth daily after supper.     No current facility-administered medications for this visit.     PHYSICAL EXAMINATION:   BP 124/78   Pulse 69   Temp 97.9 F (36.6 C) (Tympanic)   Resp 20   Ht 5\' 10"  (1.778 m)   Wt 172 lb (78 kg)   BMI 24.68 kg/m   Filed Weights   03/12/19 1419  Weight: 172 lb (78 kg)    Physical Exam  Constitutional: He is oriented to person, place, and time and well-developed, well-nourished, and in no distress.  He is walking himself.  HENT:  Head: Normocephalic and atraumatic.  Mouth/Throat: Oropharynx is clear and moist. No oropharyngeal exudate.  Eyes: Pupils are equal, round, and reactive to light.  Neck: Normal range of motion. Neck supple.  Cardiovascular: Normal rate and regular rhythm.  Pulmonary/Chest: No respiratory distress. He has no wheezes.  Abdominal: Soft. Bowel sounds are normal. He exhibits no distension and no mass. There is no abdominal tenderness. There is no rebound and no guarding.  Musculoskeletal: Normal range of motion.        General: No tenderness or edema.  Neurological: He is alert and oriented to person, place, and time.  Skin: Skin is  warm.  Psychiatric: Affect normal.    LABORATORY DATA:  I have reviewed the data as listed    Component Value Date/Time   NA 139 03/12/2019 1359   NA 137 02/24/2015 1131   K 4.4 03/12/2019 1359   K 4.1 02/24/2015 1131   CL 105 03/12/2019 1359   CL 103 02/24/2015 1131   CO2 29 03/12/2019 1359   CO2 29 02/24/2015 1131   GLUCOSE 101 (H) 03/12/2019 1359   GLUCOSE 135 (H) 02/24/2015 1131   BUN 18 03/12/2019 1359   BUN 14 02/24/2015 1131   CREATININE 1.35 (H) 03/12/2019 1359   CREATININE 1.32 (H) 02/24/2015 1131   CALCIUM 8.3 (L) 03/12/2019 1359   CALCIUM 8.8 (L) 02/24/2015 1131   PROT 6.4 (L) 11/03/2016 1100   PROT 6.5 02/24/2015 1131   ALBUMIN 3.7 11/03/2016 1100   ALBUMIN 3.9 02/24/2015 1131   AST 21 11/03/2016 1100   AST 19 02/24/2015 1131   ALT 11 (L) 11/03/2016 1100   ALT 11 (L) 02/24/2015 1131   ALKPHOS 50 11/03/2016 1100   ALKPHOS 43 02/24/2015 1131   BILITOT 0.6 11/03/2016 1100   BILITOT 0.9 02/24/2015 1131   GFRNONAA 48 (L) 03/12/2019 1359   GFRNONAA 50 (L) 02/24/2015 1131   GFRAA 55 (L) 03/12/2019 1359   GFRAA 58 (L) 02/24/2015 1131    No results found for: SPEP, UPEP  Lab Results  Component Value Date   WBC 7.9 03/12/2019   NEUTROABS 5.6 03/12/2019   HGB 11.7 (L) 03/12/2019   HCT 35.5 (L) 03/12/2019   MCV 90.8 03/12/2019   PLT 148 (L) 03/12/2019      Chemistry      Component Value Date/Time   NA 139 03/12/2019 1359   NA 137 02/24/2015 1131   K 4.4 03/12/2019 1359   K 4.1 02/24/2015 1131   CL 105 03/12/2019 1359   CL 103 02/24/2015 1131   CO2 29 03/12/2019 1359   CO2 29 02/24/2015 1131   BUN 18 03/12/2019 1359   BUN 14 02/24/2015 1131   CREATININE 1.35 (H) 03/12/2019 1359   CREATININE 1.32 (H) 02/24/2015 1131      Component Value Date/Time   CALCIUM 8.3 (L) 03/12/2019 1359   CALCIUM 8.8 (L) 02/24/2015 1131   ALKPHOS 50 11/03/2016 1100   ALKPHOS 43 02/24/2015 1131   AST 21 11/03/2016 1100   AST 19 02/24/2015 1131   ALT 11 (L) 11/03/2016  1100   ALT 11 (L) 02/24/2015 1131   BILITOT 0.6 11/03/2016 1100   BILITOT 0.9 02/24/2015 1131        ASSESSMENT & PLAN:   Idiopathic thrombocytopenic purpura ([place]) # ITP-Steroid responsive.  Currently on surveillance.  Platelets of 148. Continue surveillance.   # IDA-  Mild anemia/likely from CKD- Hb 11.7recommend PO iron.  STABLE.   # CKD-stage III- STABLE.     # weight loss- improved/ stable.   #  DISPOSITION:  # follow  up in 6 months [cbcbmp/]- Dr.B  Dr.Bliss    Cammie Sickle, MD 03/12/2019 3:58 PM

## 2019-03-14 ENCOUNTER — Other Ambulatory Visit: Payer: PPO

## 2019-03-14 ENCOUNTER — Ambulatory Visit: Payer: PPO | Admitting: Internal Medicine

## 2019-07-04 DIAGNOSIS — I739 Peripheral vascular disease, unspecified: Secondary | ICD-10-CM | POA: Diagnosis not present

## 2019-07-04 DIAGNOSIS — M79671 Pain in right foot: Secondary | ICD-10-CM | POA: Diagnosis not present

## 2019-07-04 DIAGNOSIS — D2372 Other benign neoplasm of skin of left lower limb, including hip: Secondary | ICD-10-CM | POA: Diagnosis not present

## 2019-07-04 DIAGNOSIS — M21622 Bunionette of left foot: Secondary | ICD-10-CM | POA: Diagnosis not present

## 2019-07-04 DIAGNOSIS — M21621 Bunionette of right foot: Secondary | ICD-10-CM | POA: Diagnosis not present

## 2019-07-04 DIAGNOSIS — Q828 Other specified congenital malformations of skin: Secondary | ICD-10-CM | POA: Diagnosis not present

## 2019-07-04 DIAGNOSIS — B351 Tinea unguium: Secondary | ICD-10-CM | POA: Diagnosis not present

## 2019-07-14 DIAGNOSIS — Z23 Encounter for immunization: Secondary | ICD-10-CM | POA: Diagnosis not present

## 2019-07-14 DIAGNOSIS — D519 Vitamin B12 deficiency anemia, unspecified: Secondary | ICD-10-CM | POA: Diagnosis not present

## 2019-07-28 DIAGNOSIS — R21 Rash and other nonspecific skin eruption: Secondary | ICD-10-CM | POA: Diagnosis not present

## 2019-07-28 DIAGNOSIS — K219 Gastro-esophageal reflux disease without esophagitis: Secondary | ICD-10-CM | POA: Diagnosis not present

## 2019-07-28 DIAGNOSIS — D696 Thrombocytopenia, unspecified: Secondary | ICD-10-CM | POA: Diagnosis not present

## 2019-07-28 DIAGNOSIS — Z0000 Encounter for general adult medical examination without abnormal findings: Secondary | ICD-10-CM | POA: Diagnosis not present

## 2019-07-28 DIAGNOSIS — Z6827 Body mass index (BMI) 27.0-27.9, adult: Secondary | ICD-10-CM | POA: Diagnosis not present

## 2019-07-28 DIAGNOSIS — Z7982 Long term (current) use of aspirin: Secondary | ICD-10-CM | POA: Diagnosis not present

## 2019-07-28 DIAGNOSIS — D519 Vitamin B12 deficiency anemia, unspecified: Secondary | ICD-10-CM | POA: Diagnosis not present

## 2019-07-28 DIAGNOSIS — M170 Bilateral primary osteoarthritis of knee: Secondary | ICD-10-CM | POA: Diagnosis not present

## 2019-07-28 DIAGNOSIS — Z131 Encounter for screening for diabetes mellitus: Secondary | ICD-10-CM | POA: Diagnosis not present

## 2019-09-10 ENCOUNTER — Inpatient Hospital Stay: Payer: PPO | Admitting: Nurse Practitioner

## 2019-09-10 ENCOUNTER — Inpatient Hospital Stay: Payer: PPO

## 2019-09-22 ENCOUNTER — Encounter: Payer: Self-pay | Admitting: Internal Medicine

## 2019-09-22 ENCOUNTER — Other Ambulatory Visit: Payer: Self-pay

## 2019-09-23 ENCOUNTER — Inpatient Hospital Stay: Payer: PPO | Attending: Internal Medicine

## 2019-09-23 ENCOUNTER — Encounter: Payer: Self-pay | Admitting: Internal Medicine

## 2019-09-23 ENCOUNTER — Inpatient Hospital Stay (HOSPITAL_BASED_OUTPATIENT_CLINIC_OR_DEPARTMENT_OTHER): Payer: PPO | Admitting: Internal Medicine

## 2019-09-23 ENCOUNTER — Other Ambulatory Visit: Payer: Self-pay

## 2019-09-23 DIAGNOSIS — Z885 Allergy status to narcotic agent status: Secondary | ICD-10-CM | POA: Insufficient documentation

## 2019-09-23 DIAGNOSIS — Z79899 Other long term (current) drug therapy: Secondary | ICD-10-CM | POA: Insufficient documentation

## 2019-09-23 DIAGNOSIS — Z8379 Family history of other diseases of the digestive system: Secondary | ICD-10-CM | POA: Diagnosis not present

## 2019-09-23 DIAGNOSIS — N183 Chronic kidney disease, stage 3 unspecified: Secondary | ICD-10-CM | POA: Diagnosis not present

## 2019-09-23 DIAGNOSIS — D693 Immune thrombocytopenic purpura: Secondary | ICD-10-CM | POA: Diagnosis not present

## 2019-09-23 DIAGNOSIS — Z8249 Family history of ischemic heart disease and other diseases of the circulatory system: Secondary | ICD-10-CM | POA: Insufficient documentation

## 2019-09-23 DIAGNOSIS — D631 Anemia in chronic kidney disease: Secondary | ICD-10-CM | POA: Insufficient documentation

## 2019-09-23 LAB — CBC WITH DIFFERENTIAL/PLATELET
Abs Immature Granulocytes: 0.02 10*3/uL (ref 0.00–0.07)
Basophils Absolute: 0 10*3/uL (ref 0.0–0.1)
Basophils Relative: 0 %
Eosinophils Absolute: 0.2 10*3/uL (ref 0.0–0.5)
Eosinophils Relative: 3 %
HCT: 37.2 % — ABNORMAL LOW (ref 39.0–52.0)
Hemoglobin: 12 g/dL — ABNORMAL LOW (ref 13.0–17.0)
Immature Granulocytes: 0 %
Lymphocytes Relative: 15 %
Lymphs Abs: 1.2 10*3/uL (ref 0.7–4.0)
MCH: 29.7 pg (ref 26.0–34.0)
MCHC: 32.3 g/dL (ref 30.0–36.0)
MCV: 92.1 fL (ref 80.0–100.0)
Monocytes Absolute: 0.8 10*3/uL (ref 0.1–1.0)
Monocytes Relative: 10 %
Neutro Abs: 5.4 10*3/uL (ref 1.7–7.7)
Neutrophils Relative %: 72 %
Platelets: 153 10*3/uL (ref 150–400)
RBC: 4.04 MIL/uL — ABNORMAL LOW (ref 4.22–5.81)
RDW: 14 % (ref 11.5–15.5)
WBC: 7.7 10*3/uL (ref 4.0–10.5)
nRBC: 0 % (ref 0.0–0.2)

## 2019-09-23 LAB — BASIC METABOLIC PANEL
Anion gap: 5 (ref 5–15)
BUN: 19 mg/dL (ref 8–23)
CO2: 28 mmol/L (ref 22–32)
Calcium: 8.7 mg/dL — ABNORMAL LOW (ref 8.9–10.3)
Chloride: 105 mmol/L (ref 98–111)
Creatinine, Ser: 1.34 mg/dL — ABNORMAL HIGH (ref 0.61–1.24)
GFR calc Af Amer: 56 mL/min — ABNORMAL LOW (ref >60)
GFR calc non Af Amer: 48 mL/min — ABNORMAL LOW (ref >60)
Glucose, Bld: 99 mg/dL (ref 70–99)
Potassium: 4.6 mmol/L (ref 3.5–5.1)
Sodium: 138 mmol/L (ref 135–145)

## 2019-09-23 NOTE — Assessment & Plan Note (Addendum)
#   ITP-Steroid responsive.  Currently on surveillance.  Platelets of 153; Continue surveillance.   # IDA-  Mild anemia/likely from CKD- Hb 12.2- recommend PO iron.  Stable.    # CKD-stage III- stable.    #  DISPOSITION:  # follow up in 6 months [cbcbmp/]- Dr.B   Dr.Bliss

## 2019-09-23 NOTE — Progress Notes (Signed)
[place] OFFICE PROGRESS NOTE  Patient Care Team: Lynnell Jude, MD as PCP - General (Family Medicine)   SUMMARY OF ONCOLOGIC HISTORY:  # SEP 2015- ITP [platelets- 16]; s/p prednisone ; Good Response; currently surveillance  # SEP 2015- Mild Anemia/IDA-CKD-III- ~12 s/p EGD/colo [Oct 2015; Dr.Skulskie] ; Ferritin- 17; NO BMBx;   # CKD creatinine ~1.4 [no nephro]   INTERVAL HISTORY:  85 year old male patient with history of ITP-is here for follow-up/currently on surveillance.  Patient denies any easy bruising or bleeding.  No nausea no vomiting no blood in stools or black or stools.   Review of Systems  Constitutional: Negative for chills, diaphoresis, fever and malaise/fatigue.  HENT: Negative for nosebleeds and sore throat.   Eyes: Negative for double vision.  Respiratory: Negative for cough, hemoptysis, sputum production, shortness of breath and wheezing.   Cardiovascular: Negative for chest pain, palpitations, orthopnea and leg swelling.  Gastrointestinal: Negative for abdominal pain, blood in stool, constipation, diarrhea, heartburn, melena, nausea and vomiting.  Genitourinary: Negative for dysuria.  Musculoskeletal: Negative for back pain and joint pain.  Skin: Negative.  Negative for itching and rash.  Neurological: Negative for dizziness, tingling, focal weakness, weakness and headaches.  Endo/Heme/Allergies: Does not bruise/bleed easily.  Psychiatric/Behavioral: Negative for depression. The patient is not nervous/anxious and does not have insomnia.      PAST MEDICAL HISTORY :  Past Medical History:  Diagnosis Date  . Allergic rhinitis   . Arthropathy   . B12 deficiency anemia   . Chronic neck pain   . CVA (cerebral infarction)   . GERD (gastroesophageal reflux disease)   . History of seizures   . HTN (hypertension)   . IDA (iron deficiency anemia)   . ITP (idiopathic thrombocytopenic purpura) 07/16/14    PAST SURGICAL HISTORY :   Past  Surgical History:  Procedure Laterality Date  . CHOLECYSTECTOMY  10/13/1988  . COLONOSCOPY  10/2014, 08/2014   Dr. Gwenlyn Perking  . UPPER GI ENDOSCOPY      FAMILY HISTORY :   Family History  Problem Relation Age of Onset  . Heart disease Unknown   . Cirrhosis Mother     SOCIAL HISTORY:   Social History   Tobacco Use  . Smoking status: Never Smoker  . Smokeless tobacco: Never Used  Substance Use Topics  . Alcohol use: No    Alcohol/week: 0.0 standard drinks  . Drug use: No    ALLERGIES:  is allergic to meperidine.  MEDICATIONS:  Current Outpatient Medications  Medication Sig Dispense Refill  . aspirin 325 MG tablet Take 1 tablet by mouth daily.    . RABEprazole (ACIPHEX) 20 MG tablet Take 1 tablet by mouth daily. 1 hour before meals    . tamsulosin (FLOMAX) 0.4 MG CAPS capsule Take 0.4 mg by mouth daily after supper.     No current facility-administered medications for this visit.     PHYSICAL EXAMINATION:   BP 133/80 (BP Location: Left Arm, Patient Position: Sitting, Cuff Size: Normal)   Pulse (!) 57   Temp (!) 96.7 F (35.9 C) (Tympanic)   Wt 169 lb 8 oz (76.9 kg)   BMI 24.32 kg/m   Filed Weights   09/23/19 1316  Weight: 169 lb 8 oz (76.9 kg)    Physical Exam  Constitutional: He is oriented to person, place, and time and well-developed, well-nourished, and in no distress.  He is walking himself.  HENT:  Head: Normocephalic and atraumatic.  Mouth/Throat: Oropharynx is clear and  moist. No oropharyngeal exudate.  Eyes: Pupils are equal, round, and reactive to light.  Neck: Normal range of motion. Neck supple.  Cardiovascular: Normal rate and regular rhythm.  Pulmonary/Chest: No respiratory distress. He has no wheezes.  Abdominal: Soft. Bowel sounds are normal. He exhibits no distension and no mass. There is no abdominal tenderness. There is no rebound and no guarding.  Musculoskeletal: Normal range of motion.        General: No tenderness or edema.   Neurological: He is alert and oriented to person, place, and time.  Skin: Skin is warm.  Psychiatric: Affect normal.    LABORATORY DATA:  I have reviewed the data as listed    Component Value Date/Time   NA 138 09/23/2019 1303   NA 137 02/24/2015 1131   K 4.6 09/23/2019 1303   K 4.1 02/24/2015 1131   CL 105 09/23/2019 1303   CL 103 02/24/2015 1131   CO2 28 09/23/2019 1303   CO2 29 02/24/2015 1131   GLUCOSE 99 09/23/2019 1303   GLUCOSE 135 (H) 02/24/2015 1131   BUN 19 09/23/2019 1303   BUN 14 02/24/2015 1131   CREATININE 1.34 (H) 09/23/2019 1303   CREATININE 1.32 (H) 02/24/2015 1131   CALCIUM 8.7 (L) 09/23/2019 1303   CALCIUM 8.8 (L) 02/24/2015 1131   PROT 6.4 (L) 11/03/2016 1100   PROT 6.5 02/24/2015 1131   ALBUMIN 3.7 11/03/2016 1100   ALBUMIN 3.9 02/24/2015 1131   AST 21 11/03/2016 1100   AST 19 02/24/2015 1131   ALT 11 (L) 11/03/2016 1100   ALT 11 (L) 02/24/2015 1131   ALKPHOS 50 11/03/2016 1100   ALKPHOS 43 02/24/2015 1131   BILITOT 0.6 11/03/2016 1100   BILITOT 0.9 02/24/2015 1131   GFRNONAA 48 (L) 09/23/2019 1303   GFRNONAA 50 (L) 02/24/2015 1131   GFRAA 56 (L) 09/23/2019 1303   GFRAA 58 (L) 02/24/2015 1131    No results found for: SPEP, UPEP  Lab Results  Component Value Date   WBC 7.7 09/23/2019   NEUTROABS 5.4 09/23/2019   HGB 12.0 (L) 09/23/2019   HCT 37.2 (L) 09/23/2019   MCV 92.1 09/23/2019   PLT 153 09/23/2019      Chemistry      Component Value Date/Time   NA 138 09/23/2019 1303   NA 137 02/24/2015 1131   K 4.6 09/23/2019 1303   K 4.1 02/24/2015 1131   CL 105 09/23/2019 1303   CL 103 02/24/2015 1131   CO2 28 09/23/2019 1303   CO2 29 02/24/2015 1131   BUN 19 09/23/2019 1303   BUN 14 02/24/2015 1131   CREATININE 1.34 (H) 09/23/2019 1303   CREATININE 1.32 (H) 02/24/2015 1131      Component Value Date/Time   CALCIUM 8.7 (L) 09/23/2019 1303   CALCIUM 8.8 (L) 02/24/2015 1131   ALKPHOS 50 11/03/2016 1100   ALKPHOS 43 02/24/2015 1131    AST 21 11/03/2016 1100   AST 19 02/24/2015 1131   ALT 11 (L) 11/03/2016 1100   ALT 11 (L) 02/24/2015 1131   BILITOT 0.6 11/03/2016 1100   BILITOT 0.9 02/24/2015 1131        ASSESSMENT & PLAN:   Idiopathic thrombocytopenic purpura ([place]) # ITP-Steroid responsive.  Currently on surveillance.  Platelets of 153; Continue surveillance.   # IDA-  Mild anemia/likely from CKD- Hb 12.2- recommend PO iron.  Stable.    # CKD-stage III- stable.    #  DISPOSITION:  # follow up in 6  months [cbcbmp/]- Dr.B   Dr.Bliss    Cammie Sickle, MD 09/25/2019 7:52 AM

## 2019-11-30 ENCOUNTER — Inpatient Hospital Stay: Payer: PPO

## 2019-11-30 ENCOUNTER — Emergency Department: Payer: PPO

## 2019-11-30 ENCOUNTER — Inpatient Hospital Stay
Admission: EM | Admit: 2019-11-30 | Discharge: 2019-12-01 | DRG: 100 | Disposition: A | Discharge status: Home / Self Care | Payer: PPO | Attending: Internal Medicine | Admitting: Internal Medicine

## 2019-11-30 ENCOUNTER — Encounter: Payer: Self-pay | Admitting: Internal Medicine

## 2019-11-30 ENCOUNTER — Other Ambulatory Visit: Payer: Self-pay

## 2019-11-30 DIAGNOSIS — I129 Hypertensive chronic kidney disease with stage 1 through stage 4 chronic kidney disease, or unspecified chronic kidney disease: Secondary | ICD-10-CM | POA: Diagnosis present

## 2019-11-30 DIAGNOSIS — R32 Unspecified urinary incontinence: Secondary | ICD-10-CM | POA: Diagnosis not present

## 2019-11-30 DIAGNOSIS — N183 Chronic kidney disease, stage 3 unspecified: Secondary | ICD-10-CM | POA: Diagnosis present

## 2019-11-30 DIAGNOSIS — E785 Hyperlipidemia, unspecified: Secondary | ICD-10-CM | POA: Diagnosis not present

## 2019-11-30 DIAGNOSIS — N1831 Chronic kidney disease, stage 3a: Secondary | ICD-10-CM | POA: Diagnosis present

## 2019-11-30 DIAGNOSIS — G9341 Metabolic encephalopathy: Secondary | ICD-10-CM | POA: Diagnosis present

## 2019-11-30 DIAGNOSIS — D72829 Elevated white blood cell count, unspecified: Secondary | ICD-10-CM | POA: Diagnosis present

## 2019-11-30 DIAGNOSIS — D509 Iron deficiency anemia, unspecified: Secondary | ICD-10-CM | POA: Diagnosis not present

## 2019-11-30 DIAGNOSIS — G40909 Epilepsy, unspecified, not intractable, without status epilepticus: Secondary | ICD-10-CM | POA: Diagnosis not present

## 2019-11-30 DIAGNOSIS — G8929 Other chronic pain: Secondary | ICD-10-CM | POA: Diagnosis present

## 2019-11-30 DIAGNOSIS — K219 Gastro-esophageal reflux disease without esophagitis: Secondary | ICD-10-CM | POA: Diagnosis present

## 2019-11-30 DIAGNOSIS — M542 Cervicalgia: Secondary | ICD-10-CM | POA: Diagnosis present

## 2019-11-30 DIAGNOSIS — Z9049 Acquired absence of other specified parts of digestive tract: Secondary | ICD-10-CM | POA: Diagnosis not present

## 2019-11-30 DIAGNOSIS — R404 Transient alteration of awareness: Secondary | ICD-10-CM | POA: Diagnosis not present

## 2019-11-30 DIAGNOSIS — Z79899 Other long term (current) drug therapy: Secondary | ICD-10-CM

## 2019-11-30 DIAGNOSIS — E538 Deficiency of other specified B group vitamins: Secondary | ICD-10-CM | POA: Diagnosis present

## 2019-11-30 DIAGNOSIS — R001 Bradycardia, unspecified: Secondary | ICD-10-CM | POA: Diagnosis not present

## 2019-11-30 DIAGNOSIS — R0681 Apnea, not elsewhere classified: Secondary | ICD-10-CM | POA: Diagnosis not present

## 2019-11-30 DIAGNOSIS — Z8673 Personal history of transient ischemic attack (TIA), and cerebral infarction without residual deficits: Secondary | ICD-10-CM

## 2019-11-30 DIAGNOSIS — Z7982 Long term (current) use of aspirin: Secondary | ICD-10-CM

## 2019-11-30 DIAGNOSIS — I6523 Occlusion and stenosis of bilateral carotid arteries: Secondary | ICD-10-CM | POA: Diagnosis present

## 2019-11-30 DIAGNOSIS — R4182 Altered mental status, unspecified: Secondary | ICD-10-CM | POA: Diagnosis not present

## 2019-11-30 DIAGNOSIS — R29700 NIHSS score 0: Secondary | ICD-10-CM | POA: Diagnosis not present

## 2019-11-30 DIAGNOSIS — Z20822 Contact with and (suspected) exposure to covid-19: Secondary | ICD-10-CM | POA: Diagnosis not present

## 2019-11-30 DIAGNOSIS — R52 Pain, unspecified: Secondary | ICD-10-CM | POA: Diagnosis not present

## 2019-11-30 DIAGNOSIS — Z888 Allergy status to other drugs, medicaments and biological substances status: Secondary | ICD-10-CM | POA: Diagnosis not present

## 2019-11-30 DIAGNOSIS — R29818 Other symptoms and signs involving the nervous system: Secondary | ICD-10-CM | POA: Diagnosis not present

## 2019-11-30 DIAGNOSIS — I6389 Other cerebral infarction: Secondary | ICD-10-CM | POA: Diagnosis not present

## 2019-11-30 DIAGNOSIS — I10 Essential (primary) hypertension: Secondary | ICD-10-CM | POA: Diagnosis not present

## 2019-11-30 DIAGNOSIS — N400 Enlarged prostate without lower urinary tract symptoms: Secondary | ICD-10-CM | POA: Diagnosis not present

## 2019-11-30 DIAGNOSIS — I071 Rheumatic tricuspid insufficiency: Secondary | ICD-10-CM | POA: Diagnosis not present

## 2019-11-30 DIAGNOSIS — R569 Unspecified convulsions: Principal | ICD-10-CM

## 2019-11-30 DIAGNOSIS — Z03818 Encounter for observation for suspected exposure to other biological agents ruled out: Secondary | ICD-10-CM | POA: Diagnosis not present

## 2019-11-30 DIAGNOSIS — I639 Cerebral infarction, unspecified: Secondary | ICD-10-CM | POA: Diagnosis present

## 2019-11-30 DIAGNOSIS — R4020 Unspecified coma: Secondary | ICD-10-CM | POA: Diagnosis not present

## 2019-11-30 LAB — DIFFERENTIAL
Abs Immature Granulocytes: 0.24 10*3/uL — ABNORMAL HIGH (ref 0.00–0.07)
Basophils Absolute: 0 10*3/uL (ref 0.0–0.1)
Basophils Relative: 0 %
Eosinophils Absolute: 0.1 10*3/uL (ref 0.0–0.5)
Eosinophils Relative: 1 %
Immature Granulocytes: 2 %
Lymphocytes Relative: 15 %
Lymphs Abs: 1.8 10*3/uL (ref 0.7–4.0)
Monocytes Absolute: 0.8 10*3/uL (ref 0.1–1.0)
Monocytes Relative: 7 %
Neutro Abs: 9.3 10*3/uL — ABNORMAL HIGH (ref 1.7–7.7)
Neutrophils Relative %: 75 %

## 2019-11-30 LAB — URINE DRUG SCREEN, QUALITATIVE (ARMC ONLY)
Amphetamines, Ur Screen: NOT DETECTED
Barbiturates, Ur Screen: NOT DETECTED
Benzodiazepine, Ur Scrn: NOT DETECTED
Cannabinoid 50 Ng, Ur ~~LOC~~: NOT DETECTED
Cocaine Metabolite,Ur ~~LOC~~: NOT DETECTED
MDMA (Ecstasy)Ur Screen: NOT DETECTED
Methadone Scn, Ur: NOT DETECTED
Opiate, Ur Screen: NOT DETECTED
Phencyclidine (PCP) Ur S: NOT DETECTED
Tricyclic, Ur Screen: NOT DETECTED

## 2019-11-30 LAB — URINALYSIS, COMPLETE (UACMP) WITH MICROSCOPIC
Bacteria, UA: NONE SEEN
Bilirubin Urine: NEGATIVE
Glucose, UA: NEGATIVE mg/dL
Hgb urine dipstick: NEGATIVE
Ketones, ur: NEGATIVE mg/dL
Leukocytes,Ua: NEGATIVE
Nitrite: NEGATIVE
Protein, ur: NEGATIVE mg/dL
Specific Gravity, Urine: 1.008 (ref 1.005–1.030)
Squamous Epithelial / HPF: NONE SEEN (ref 0–5)
WBC, UA: NONE SEEN WBC/hpf (ref 0–5)
pH: 7 (ref 5.0–8.0)

## 2019-11-30 LAB — COMPREHENSIVE METABOLIC PANEL
ALT: 13 U/L (ref 0–44)
AST: 23 U/L (ref 15–41)
Albumin: 3.8 g/dL (ref 3.5–5.0)
Alkaline Phosphatase: 45 U/L (ref 38–126)
Anion gap: 11 (ref 5–15)
BUN: 23 mg/dL (ref 8–23)
CO2: 25 mmol/L (ref 22–32)
Calcium: 8.4 mg/dL — ABNORMAL LOW (ref 8.9–10.3)
Chloride: 104 mmol/L (ref 98–111)
Creatinine, Ser: 1.29 mg/dL — ABNORMAL HIGH (ref 0.61–1.24)
GFR calc Af Amer: 58 mL/min — ABNORMAL LOW (ref >60)
GFR calc non Af Amer: 50 mL/min — ABNORMAL LOW (ref >60)
Glucose, Bld: 126 mg/dL — ABNORMAL HIGH (ref 70–99)
Potassium: 3.6 mmol/L (ref 3.5–5.1)
Sodium: 140 mmol/L (ref 135–145)
Total Bilirubin: 0.7 mg/dL (ref 0.3–1.2)
Total Protein: 6.7 g/dL (ref 6.5–8.1)

## 2019-11-30 LAB — SARS CORONAVIRUS 2 (TAT 6-24 HRS): SARS Coronavirus 2: NEGATIVE

## 2019-11-30 LAB — CBC
HCT: 37.4 % — ABNORMAL LOW (ref 39.0–52.0)
Hemoglobin: 12.3 g/dL — ABNORMAL LOW (ref 13.0–17.0)
MCH: 29.6 pg (ref 26.0–34.0)
MCHC: 32.9 g/dL (ref 30.0–36.0)
MCV: 89.9 fL (ref 80.0–100.0)
Platelets: 143 10*3/uL — ABNORMAL LOW (ref 150–400)
RBC: 4.16 MIL/uL — ABNORMAL LOW (ref 4.22–5.81)
RDW: 14.5 % (ref 11.5–15.5)
WBC: 12.3 10*3/uL — ABNORMAL HIGH (ref 4.0–10.5)
nRBC: 0 % (ref 0.0–0.2)

## 2019-11-30 LAB — PROTIME-INR
INR: 0.9 (ref 0.8–1.2)
Prothrombin Time: 12.4 seconds (ref 11.4–15.2)

## 2019-11-30 LAB — APTT: aPTT: 26 seconds (ref 24–36)

## 2019-11-30 MED ORDER — IOHEXOL 350 MG/ML SOLN
75.0000 mL | Freq: Once | INTRAVENOUS | Status: AC | PRN
  Administered 2019-11-30: 13:00:00 75 mL via INTRAVENOUS

## 2019-11-30 MED ORDER — HYDRALAZINE HCL 50 MG PO TABS
25.0000 mg | ORAL_TABLET | Freq: Three times a day (TID) | ORAL | Status: DC | PRN
  Administered 2019-11-30: 09:00:00 25 mg via ORAL
  Filled 2019-11-30: qty 1

## 2019-11-30 MED ORDER — ONDANSETRON HCL 4 MG/2ML IJ SOLN: 4.0000 mg | Freq: Three times a day (TID) | INTRAMUSCULAR | Status: DC | PRN

## 2019-11-30 MED ORDER — PANTOPRAZOLE SODIUM 40 MG PO TBEC
40.0000 mg | DELAYED_RELEASE_TABLET | Freq: Every day | ORAL | Status: DC
  Administered 2019-11-30 – 2019-12-01 (×2): 40 mg via ORAL
  Filled 2019-11-30 (×3): qty 1

## 2019-11-30 MED ORDER — TAMSULOSIN HCL 0.4 MG PO CAPS
0.4000 mg | ORAL_CAPSULE | Freq: Every day | ORAL | Status: DC
  Administered 2019-11-30: 17:00:00 0.4 mg via ORAL
  Filled 2019-11-30: qty 1

## 2019-11-30 MED ORDER — ACETAMINOPHEN 325 MG PO TABS: 650.0000 mg | ORAL_TABLET | Freq: Four times a day (QID) | ORAL | Status: DC | PRN

## 2019-11-30 MED ORDER — LORAZEPAM 2 MG/ML IJ SOLN: 1.0000 mg | INTRAMUSCULAR | Status: DC | PRN

## 2019-11-30 MED ORDER — ENOXAPARIN SODIUM 40 MG/0.4ML ~~LOC~~ SOLN
40.0000 mg | SUBCUTANEOUS | Status: DC
  Administered 2019-12-01: 40 mg via SUBCUTANEOUS
  Filled 2019-11-30: qty 0.4

## 2019-11-30 MED ORDER — ASPIRIN 325 MG PO TABS
325.0000 mg | ORAL_TABLET | Freq: Every day | ORAL | Status: DC
  Administered 2019-11-30 – 2019-12-01 (×2): 325 mg via ORAL
  Filled 2019-11-30 (×2): qty 1

## 2019-11-30 MED ORDER — LEVETIRACETAM IN NACL 500 MG/100ML IV SOLN
500.0000 mg | Freq: Two times a day (BID) | INTRAVENOUS | Status: DC
  Administered 2019-11-30 – 2019-12-01 (×2): 500 mg via INTRAVENOUS
  Filled 2019-11-30 (×4): qty 100

## 2019-11-30 MED ORDER — ACETAMINOPHEN 650 MG RE SUPP: 650.0000 mg | Freq: Four times a day (QID) | RECTAL | Status: DC | PRN

## 2019-11-30 NOTE — ED Notes (Signed)
Pt returned to room att

## 2019-11-30 NOTE — ED Notes (Signed)
Pharm on phone given to pt's wife to verify meds

## 2019-11-30 NOTE — ED Notes (Signed)
Returned from [place], family at bedside

## 2019-11-30 NOTE — ED Notes (Signed)
Patient transported to MRI 

## 2019-11-30 NOTE — ED Notes (Signed)
Family at bedside. Wife reports she and pt went to bed at midnight and at 3am wife woke to pt "making strange and awful noises", pt is now talking to family on phone

## 2019-11-30 NOTE — ED Notes (Signed)
Pt to CT with Lea RN

## 2019-11-30 NOTE — ED Triage Notes (Signed)
Patient presents to Emergency Department via [facility] from home with complaints of wife found unresponsive .  EMS reports agonal breathing w/ apnea, 30-40 HR x 4, 98% on 4lpm [place]  CBG 120   Pt withdrawals from pain and pinpoint pupils

## 2019-11-30 NOTE — ED Notes (Signed)
Pt had small BM, brief changed and pad changed. Pt clean and dry at this time.

## 2019-11-30 NOTE — H&P (Signed)
History and Physical    [name] YNW:[id number] DOB: [birth year] DOA: 11/30/2019  Referring MD/NP/PA:   PCP: Lynnell Jude, MD   Patient coming from:  The patient is coming from home.  At baseline, pt is independent for most of ADL.        Chief Complaint: AMS and unresponsiveness  HPI: [name] is a 86 y.o. male with medical history significant of hypertension, stroke, GERD, idiopathic thrombocytopenia, iron deficiency anemia, BPH, CKD stage IIIa, vitamin B12 deficiency, who presents with altered mental status and unresponsiveness.  Per his wife, patient was at his baseline when they went to bed last night. Pt was found of making strange and awful noises at about 3:30 AM. His wife tred to wake him up but he was unresponsive. Pt has incontinence of urine. EMS reports agonal breathing w/ apnea. His mental status also gradually improved. When I saw pt in ED, patient is alert, oriented x3.  No unilateral weakness, numbness or tingling sacrum history no facial droop or slurred speech.  Patient does not have any chest pain, shortness of breath, cough, fever or chills.  His wife states that the patient had some nausea and diarrhea last week, which has resolved.  Currently no GI symptoms.  No symptoms of UTI.  Of note, patient had seizures in 2011 after having a stroke but has not had any since then. He is not taking seizure medications currently.  ED Course: pt was found to have WBC 12.3, INR 0.9, PTT 26, pending COVID-19 PCR, stable renal function, temperature normal, blood pressure 155/89, heart rate 60, oxygen saturation 99% on room air (patient had a 1 episode of 89% saturation on room air), CT head is negative for acute intracranial abnormalities.  MRI for brain did not show new stroke.  Patient is admitted to [facility] bed as inpatient.  Neurologist, Dr. Lovell Sheehan was consulted.  Review of Systems:   General: no fevers, chills, no body weight gain, has fatigue HEENT: no blurry vision, hearing  changes or sore throat Respiratory: no dyspnea, coughing, wheezing CV: no chest pain, no palpitations GI: no nausea, vomiting, abdominal pain, diarrhea, constipation GU: no dysuria, burning on urination, increased urinary frequency, hematuria  Ext: no leg edema Neuro: no unilateral weakness, numbness, or tingling, no vision change or hearing loss. Had AMS and unresponsiveness. Skin: no rash, no skin tear. MSK: No muscle spasm, no deformity, no limitation of range of movement in spin Heme: No easy bruising.  Travel history: No recent long distant travel.  Allergy:  Allergies  Allergen Reactions  . Meperidine Hives    Past Medical History:  Diagnosis Date  . Allergic rhinitis   . Arthropathy   . B12 deficiency anemia   . Chronic neck pain   . CVA (cerebral infarction)   . GERD (gastroesophageal reflux disease)   . History of seizures   . HTN (hypertension)   . IDA (iron deficiency anemia)   . ITP (idiopathic thrombocytopenic purpura) 07/16/14    Past Surgical History:  Procedure Laterality Date  . CHOLECYSTECTOMY  10/13/1988  . COLONOSCOPY  10/2014, 08/2014   Dr. Gwenlyn Perking  . UPPER GI ENDOSCOPY      Social History:  reports that he has never smoked. He has never used smokeless tobacco. He reports that he does not drink alcohol or use drugs.  Family History:  Family History  Problem Relation Age of Onset  . Heart disease Unknown   . Cirrhosis Mother  Prior to Admission medications   Medication Sig Start Date End Date Taking? Authorizing Provider  aspirin 325 MG tablet Take 1 tablet by mouth daily. 09/19/10  Yes [provider]  RABEprazole (ACIPHEX) 20 MG tablet Take 1 tablet by mouth daily. 1 hour before meals 06/02/15  Yes [provider]  tamsulosin (FLOMAX) 0.4 MG CAPS capsule Take 0.4 mg by mouth daily after supper.    [provider]    Physical Exam: Vitals:   11/30/19 0730 11/30/19 0800 11/30/19 0830 11/30/19 0912  BP: (!)  184/80 (!) 181/73 (!) 191/81 (!) 184/115  Pulse: (!) 52 (!) 49 (!) 52   Resp: 11 13 12    Temp:      TempSrc:      SpO2: 100% 100% 100%   Weight:      Height:       General: Not in acute distress HEENT:       Eyes: PERRL, EOMI, no scleral icterus.       ENT: No discharge from the ears and nose, no pharynx injection, no tonsillar enlargement.        Neck: No JVD, no bruit, no mass felt. Heme: No neck lymph node enlargement. Cardiac: S1/S2, RRR, No murmurs, No gallops or rubs. Respiratory: No rales, wheezing, rhonchi or rubs. GI: Soft, nondistended, nontender, no rebound pain, no organomegaly, BS present. GU: No hematuria Ext: No pitting leg edema bilaterally. 2+DP/PT pulse bilaterally. Musculoskeletal: No joint deformities, No joint redness or warmth, no limitation of ROM in spin. Skin: No rashes.  Neuro: currently pt is alert, oriented X3, cranial nerves II-XII grossly intact, moves all extremities normally.  Psych: Patient is not psychotic, no suicidal or hemocidal ideation.  Labs on Admission: I have personally reviewed following labs and imaging studies  CBC: Recent Labs  Lab 11/30/19 0449  WBC 12.3*  NEUTROABS 9.3*  HGB 12.3*  HCT 37.4*  MCV 89.9  PLT 924*   Basic Metabolic Panel: Recent Labs  Lab 11/30/19 0449  NA 140  K 3.6  CL 104  CO2 25  GLUCOSE 126*  BUN 23  CREATININE 1.29*  CALCIUM 8.4*   GFR: Estimated Creatinine Clearance: 43.2 mL/min (A) (by C-G formula based on SCr of 1.29 mg/dL (H)). Liver Function Tests: Recent Labs  Lab 11/30/19 0449  AST 23  ALT 13  ALKPHOS 45  BILITOT 0.7  PROT 6.7  ALBUMIN 3.8   No results for input(s): LIPASE, AMYLASE in the last 168 hours. No results for input(s): AMMONIA in the last 168 hours. Coagulation Profile: Recent Labs  Lab 11/30/19 0651  INR 0.9   Cardiac Enzymes: No results for input(s): CKTOTAL, CKMB, CKMBINDEX, TROPONINI in the last 168 hours. BNP (last 3 results) No results for input(s):  PROBNP in the last 8760 hours. HbA1C: No results for input(s): HGBA1C in the last 72 hours. CBG: No results for input(s): GLUCAP in the last 168 hours. Lipid Profile: No results for input(s): CHOL, HDL, LDLCALC, TRIG, CHOLHDL, LDLDIRECT in the last 72 hours. Thyroid Function Tests: No results for input(s): TSH, T4TOTAL, FREET4, T3FREE, THYROIDAB in the last 72 hours. Anemia Panel: No results for input(s): VITAMINB12, FOLATE, FERRITIN, TIBC, IRON, RETICCTPCT in the last 72 hours. Urine analysis: No results found for: COLORURINE, APPEARANCEUR, LABSPEC, PHURINE, GLUCOSEU, HGBUR, BILIRUBINUR, KETONESUR, PROTEINUR, UROBILINOGEN, NITRITE, LEUKOCYTESUR Sepsis Labs: @LABRCNTIP (procalcitonin:4,lacticidven:4) )No results found for this or any previous visit (from the past 240 hour(s)).   Radiological Exams on Admission: MR BRAIN WO CONTRAST  Result Date:  11/30/2019 CLINICAL DATA:  Seizure, nontraumatic. Age over 81. Altered mental status. EXAM: MRI HEAD WITHOUT CONTRAST TECHNIQUE: Multiplanar, multiecho pulse sequences of the brain and surrounding structures were obtained without intravenous contrast. COMPARISON:  CT head without contrast 11/30/2019 FINDINGS: Brain: No acute infarct, hemorrhage, or mass lesion is present. Moderate atrophy and diffuse white matter disease is present bilaterally. Remote cortical and subcortical infarcts are present in the posterior right frontal lobe and anterior right parietal lobe near the region of the hand portion of the homunculus. Confluent periventricular and scattered subcortical T2 hyperintensities are present elsewhere without cortical involvement. A remote lacunar infarct is present in the left cerebellum. Remote lacunar infarcts are present in the thalami bilaterally. Dilated perivascular spaces are present through the basal ganglia. The ventricles are proportionate to the degree of atrophy. No significant extraaxial fluid collection is present. Temporal lobes  are unremarkable. No focal mass lesion present. No significant signal changes are present. Vascular: Flow is present in the major intracranial arteries. Skull and upper cervical spine: The craniocervical junction is normal. Upper cervical spine is within normal limits. Marrow signal is unremarkable. Sinuses/Orbits: A right middle ear and mastoid effusion is present. No obstructing nasopharyngeal lesion is present. Left mastoid air cells are clear. Minimal mucosal thickening is present at floor of the right maxillary sinus. The right frontal sinus is not pneumatized. The paranasal sinuses and mastoid air cells are otherwise clear. The globes and orbits are within normal limits. IMPRESSION: 1. No acute or focal abnormality to explain the patient's symptoms. 2. Moderate atrophy and diffuse white matter disease likely reflects the sequela of chronic microvascular ischemia. 3. Remote cortical and subcortical infarcts involving the posterior right frontal lobe and anterior right parietal lobe. 4. Remote lacunar infarcts of the thalami bilaterally and the left cerebellum. 5. Right middle ear and mastoid effusion. No obstructing nasopharyngeal lesion is present. Infection is not excluded. Electronically Signed   By: San Morelle M.D.   On: 11/30/2019 06:57   CT HEAD CODE STROKE WO CONTRAST  Result Date: 11/30/2019 CLINICAL DATA:  Code stroke. Initial evaluation for acute unresponsiveness. EXAM: CT HEAD WITHOUT CONTRAST TECHNIQUE: Contiguous axial images were obtained from the base of the skull through the vertex without intravenous contrast. COMPARISON:  None. FINDINGS: Brain: Generalized age-related cerebral atrophy with chronic small vessel ischemic disease. No acute intracranial hemorrhage. No acute large vessel territory infarct. No mass lesion, midline shift or mass effect. No hydrocephalus. No extra-axial fluid collection. Vascular: No hyperdense vessel. Calcified atherosclerosis present at skull base.  Skull: Scalp soft tissues and calvarium within normal limits. Sinuses/Orbits: Globes and orbital soft tissues normal. Paranasal sinuses are clear. Chronic opacification of the right mastoid air cells and middle ear cavity. Visualized nasopharynx grossly unremarkable. Other: None. ASPECTS [facility] Stroke Program Early CT Score) - Ganglionic level infarction (caudate, lentiform nuclei, internal capsule, insula, M1-M3 cortex): 7 - Supraganglionic infarction (M4-M6 cortex): 3 Total score (0-10 with 10 being normal): 10 IMPRESSION: 1. No acute intracranial infarct or other abnormality. 2. ASPECTS is 10. 3. Age-related cerebral atrophy with chronic small vessel ischemic disease. 4. Chronic right mastoid effusion. Critical Value/emergent results were called by telephone at the time of interpretation on 11/30/2019 at 5:15 am to [place] , who verbally acknowledged these results. Electronically Signed   By: Jeannine Boga M.D.   On: 11/30/2019 05:17     EKG: Independently reviewed.  First EKG strip in is poor quality, difficult to interpret it .The repeated EKG showed QTC 482, sinus  rhythm, LAE, bradycardia.   Assessment/Plan Principal Problem:   Acute metabolic encephalopathy Active Problems:   IDA (iron deficiency anemia)   Seizure (HCC)   Stroke (HCC)   CKD (chronic kidney disease), stage IIIa   BPH (benign prostatic hyperplasia)   Leukocytosis   Acute metabolic encephalopathy: Etiology is not clear.  Possibly due to seizure. CT head is negative.  MRI of the brain is negative for acute stroke.  Neurology, Dr. Lorraine Lax was consulted.  -Admitted to [facility] bed as inpatient -Seizure precaution -When necessary Ativan for seizure -Highly appreciate neurologist recommendation as follows:  Recommendations  MRI brain without contrast -->done already   CT of the head and neck  Echocardiogram  Lipid profile and A1c  Telemetry monitoring to assess for paroxysmal A. Fib, if  negative recommend 30 day loop monitor on d/c  BP goal permissive upto <185/110 mmHg  Frequent neurochecks every 4 hours  Swallow evaluation before starting diet  We will empirically start patient on Keppra 500 mg twice daily and routine EEG (if not completed by Monday, this can be done as outpatient)   IDA (iron deficiency anemia): -Continue iron supplement  Hx of  Stroke [facility]): -ASA  CKD (chronic kidney disease), stage IIIa: stable -f/u by CBC  BPH (benign prostatic hyperplasia): -flomax  Leukocytosis: WBC 12.3, no source of infection identified.  Possibly due to reactive reaction -Follow-up with CBC     Inpatient status:  # Patient requires inpatient status due to high intensity of service, high risk for further deterioration and high frequency of surveillance required.  I certify that at the point of admission it is my clinical judgment that the patient will require inpatient hospital care spanning beyond 2 midnights from the point of admission.  . This patient has multiple chronic comorbidities including hypertension, stroke, GERD, idiopathic thrombocytopenia, iron deficiency anemia, BPH, CKD stage IIIa, vitamin B12 deficiency . Now patient has presenting with unresponsiveness . The initial radiographic and laboratory data are worrisome because of leukocytosis . Current medical needs: please see my assessment and plan . Predictability of an adverse outcome (risk): Patient has multiple comorbidities as listed above. Now presents with unresponsiveness and possible seizure. Will need more workup and treatment. Patient's presentation is highly complicated.  Patient is at high risk of deteriorating.  Will need to be treated in hospital for at least 2 days.     DVT ppx: SQ Lovenox Code Status: Full code Family Communication:   Yes, patient's wife at bed side Disposition Plan:  Anticipate discharge back to previous home environment Consults called:  Dr. Lorraine Lax of  neuro Admission status: Med-surg bed as inpt      Date of Service 11/30/2019    [facility]   If 7PM-7AM, please contact night-coverage [email or web address] Password [facility] 11/30/2019, 9:25 AM

## 2019-11-30 NOTE — ED Notes (Addendum)
[name] (daughter in law) [phone]

## 2019-11-30 NOTE — ED Notes (Signed)
Pt bedsheets and brief changed by this RN. New brief, gown, and bed sheets applied at this time.

## 2019-11-30 NOTE — ED Provider Notes (Signed)
[facility] Emergency Department Provider Note  ____________________________________________  Time seen: Approximately 5:29 AM  I have reviewed the triage vital signs and the nursing notes.   HISTORY  Chief Complaint Altered Mental Status  Level 5 caveat:  Portions of the history and physical were unable to be obtained due to AMS   HPI [name] is a 86 y.o. male with history of CVA, B12 deficiency anemia, iron deficiency anemia, hypertension, ITP who presents for altered mental status.  According to his wife, patient was at his baseline when they went to bed last night.  She woke up this morning with patient is snoring very loud.  She try to wake him up but he was unresponsive.  When EMS arrived patient was unresponsive and breathing 3-4 times a minute.  He had pinpoint pupils and was incontinent of urine.  Upon arrival to the emergency room patient with a GCS of 7 but nonverbal.  After a few minutes patient started talking and moving.  Patient very confused and unable to provide any history.   Past Medical History:  Diagnosis Date   Allergic rhinitis    Arthropathy    B12 deficiency anemia    Chronic neck pain    CVA (cerebral infarction)    GERD (gastroesophageal reflux disease)    History of seizures    HTN (hypertension)    IDA (iron deficiency anemia)    ITP (idiopathic thrombocytopenic purpura) 07/16/14    Patient Active Problem List   Diagnosis Date Noted   Seizure ([place]) 19/41/7408   Acute metabolic encephalopathy 14/48/1856   Stroke ([place]) 11/30/2019   CKD (chronic kidney disease), stage IIIa 11/30/2019   BPH (benign prostatic hyperplasia) 11/30/2019   Leukocytosis 11/30/2019   Idiopathic thrombocytopenic purpura ([place]) 10/06/2015   Erosive esophagitis 09/14/2014   IDA (iron deficiency anemia) 08/10/2014    Past Surgical History:  Procedure Laterality Date   CHOLECYSTECTOMY  10/13/1988   COLONOSCOPY  10/2014,  08/2014   Dr. Gwenlyn Perking   UPPER GI ENDOSCOPY      Prior to Admission medications   Medication Sig Start Date End Date Taking? Authorizing Provider  aspirin 325 MG tablet Take 1 tablet by mouth daily. 09/19/10  Yes [provider]  RABEprazole (ACIPHEX) 20 MG tablet Take 1 tablet by mouth daily. 1 hour before meals 06/02/15  Yes [provider]  tamsulosin (FLOMAX) 0.4 MG CAPS capsule Take 0.4 mg by mouth daily after supper.    [provider]    Allergies Meperidine  Family History  Problem Relation Age of Onset   Heart disease Unknown    Cirrhosis Mother     Social History Social History   Tobacco Use   Smoking status: Never Smoker   Smokeless tobacco: Never Used  Substance Use Topics   Alcohol use: No    Alcohol/week: 0.0 standard drinks   Drug use: No    Review of Systems  Constitutional: Negative for fever. + AMS Cardiovascular: Negative for chest pain. Respiratory: Negative for shortness of breath. Gastrointestinal: Negative for abdominal pain, vomiting or diarrhea. Neurological: Negative for headaches, weakness or numbness. Psych: No SI or HI  ____________________________________________   PHYSICAL EXAM:  VITAL SIGNS: ED Triage Vitals  Enc Vitals Group     BP 11/30/19 0454 (!) 167/89     Pulse Rate 11/30/19 0454 68     Resp 11/30/19 0443 12     Temp 11/30/19 0454 98.5 F (36.9 C)     Temp  Source 11/30/19 0454 Oral     SpO2 11/30/19 0454 98 %     Weight 11/30/19 0456 180 lb (81.6 kg)     Height 11/30/19 0456 5\' 10"  (1.778 m)     Head Circumference --      Peak Flow --      Pain Score --      Pain Loc --      Pain Edu? --      Excl. in [place]? --     Constitutional: GCS 7, confused, non verbal HEENT:      Head: Normocephalic and atraumatic.         Eyes: Conjunctivae are normal. Sclera is non-icteric.       Mouth/Throat: Mucous membranes are moist.       Nose: abrasion to the tip of the nose      Neck: Supple with  no signs of meningismus. Cardiovascular: Regular rate and rhythm. No murmurs, gallops, or rubs. 2+ symmetrical distal pulses are present in all extremities. No JVD. Respiratory: Normal respiratory effort. Lungs are clear to auscultation bilaterally. No wheezes, crackles, or rhonchi.  Gastrointestinal: Soft, non tender, and non distended with positive bowel sounds. No rebound or guarding. Musculoskeletal: Nontender with normal range of motion in all extremities. No edema, cyanosis, or erythema of extremities. Neurologic: Face is symmetric. Eyes open spontaneously, non verbal, moving all extremities Skin: Skin is warm, dry and intact. No rash noted.  ____________________________________________   LABS (all labs ordered are listed, but only abnormal results are displayed)  Labs Reviewed  CBC - Abnormal; Notable for the following components:      Result Value   WBC 12.3 (*)    RBC 4.16 (*)    Hemoglobin 12.3 (*)    HCT 37.4 (*)    Platelets 143 (*)    All other components within normal limits  DIFFERENTIAL - Abnormal; Notable for the following components:   Neutro Abs 9.3 (*)    Abs Immature Granulocytes 0.24 (*)    All other components within normal limits  COMPREHENSIVE METABOLIC PANEL - Abnormal; Notable for the following components:   Glucose, Bld 126 (*)    Creatinine, Ser 1.29 (*)    Calcium 8.4 (*)    GFR calc non Af Amer 50 (*)    GFR calc Af Amer 58 (*)    All other components within normal limits  URINALYSIS, COMPLETE (UACMP) WITH MICROSCOPIC - Abnormal; Notable for the following components:   Color, Urine COLORLESS (*)    APPearance CLEAR (*)    All other components within normal limits  SARS CORONAVIRUS 2 (TAT 6-24 HRS)  URINE DRUG SCREEN, QUALITATIVE ([facility] ONLY)  PROTIME-INR  APTT  BASIC METABOLIC PANEL  CBC  HEMOGLOBIN A1C  LIPID PANEL   ____________________________________________  EKG  ED ECG REPORT I, Rudene Re, the attending physician,  personally viewed and interpreted this ECG.  NSR with frequent PACs, rate 87, normal QTc, normal axis, no STE or depression. ____________________________________________  RADIOLOGY  I have personally reviewed the images performed during this visit and I agree with the Radiologist's read.   Interpretation by Radiologist:  CT ANGIO HEAD W OR WO CONTRAST  Result Date: 11/30/2019 CLINICAL DATA:  Transient episode of altered mental status during the middle the night. Patient is returned to baseline but is amnestic of the event. EXAM: CT ANGIOGRAPHY HEAD AND NECK TECHNIQUE: Multidetector CT imaging of the head and neck was performed using the standard protocol during bolus administration of intravenous  contrast. Multiplanar CT image reconstructions and MIPs were obtained to evaluate the vascular anatomy. Carotid stenosis measurements (when applicable) are obtained utilizing NASCET criteria, using the distal internal carotid diameter as the denominator. CONTRAST:  44mL OMNIPAQUE IOHEXOL 350 MG/ML SOLN COMPARISON:  CT head without contrast 11/30/2019. MR head without contrast 11/30/2019 FINDINGS: CT HEAD FINDINGS Brain: Remote cortical infarcts of the posterior right frontal lobe and anterior right parietal lobe are again noted. Atrophy and white matter disease are stable. No acute intracranial abnormality or significant interval changes present. Vascular: Vascular calcifications are again seen. Skull: Calvarium is intact. No focal lytic or blastic lesions are present. No significant extracranial soft tissue lesion is present. Sinuses: Right middle ear and mastoid effusion is present. The paranasal sinuses and mastoid air cells are otherwise clear. Orbits: The globes and orbits are within normal limits. Review of the MIP images confirms the above findings CTA NECK FINDINGS Aortic arch: A 3 vessel arch configuration is present. Atherosclerotic changes are present in the aorta. There is no aneurysm. Extensive  mural plaque is present in the proximal left subclavian artery without a significant stenosis. Right carotid system: The right common carotid artery is within normal limits. Dense atherosclerotic calcifications are present at the right carotid bifurcation. Lumen is narrowed 1.2 mm. The more distal vessel measures 4 mm. There is slight irregularity within the cervical right ICA without a significant stenosis. Left carotid system: The left common carotid artery is within normal limits. Atherosclerotic calcifications are present at the carotid bifurcation. Mild irregularity is present within the more distal cervical left ICA without a significant stenosis. Vertebral arteries: The left vertebral artery is slightly dominant to the right. Both vertebral arteries originate from the subclavian arteries without significant stenosis. There is no significant stenosis of either vertebral artery in the neck. Skeleton: Multilevel degenerative changes are noted. Degenerative anterolisthesis present C3-4 and C4-5. Chronic endplate changes and uncovertebral spurring is worse at C5-6 than C6-7. No focal lytic or blastic lesions are present. Vertebral body heights are maintained. Other neck: No focal mucosal or submucosal lesions are present. Salivary glands are within normal limits. No significant adenopathy is present. The thyroid is within normal limits. Upper chest: Mild dependent atelectasis is present. Upper lung fields are clear. Thoracic inlet is within normal limits. Review of the MIP images confirms the above findings CTA HEAD FINDINGS Anterior circulation: Atherosclerotic changes are present within the cavernous internal carotid arteries bilaterally. There is no significant stenosis through the ICA termini. The A1 and M1 segments are normal. The anterior communicating artery is patent. MCA bifurcations are within normal limits. The ACA and MCA branch vessels are unremarkable. Posterior circulation: Atherosclerotic changes  are present at the dural margin. Moderate left and mild right stenosis is present. The more distal V4 segments are within normal limits. Vertebrobasilar junction is normal. The PICA origins are visualized and normal. Both posterior cerebral arteries originate from the basilar tip. PCA branch vessels are within normal limits. Venous sinuses: Dural sinuses are patent. The right transverse sinus is dominant. The straight sinus and deep cerebral veins are intact. Cortical veins are unremarkable. Anatomic variants: None Review of the MIP images confirms the above findings IMPRESSION: 1. No emergent large vessel occlusion 2. Atherosclerotic changes at the carotid bifurcations bilaterally with approximately 70% stenosis of the right internal carotid artery at the bifurcation. 3. Beaded irregularity of the cervical internal carotid arteries bilaterally raises concern for FMD. 4. Atherosclerotic changes within the cavernous internal carotid arteries bilaterally without significant stenosis.  5. Moderate left and mild right V4 segment stenosis. 6. Multilevel spondylosis of the cervical spine. 7. Remote cortical infarcts of the right frontal and parietal lobes. 8. Right middle ear and mastoid effusion. Electronically Signed   By: San Morelle M.D.   On: 11/30/2019 13:17   CT ANGIO NECK W OR WO CONTRAST  Result Date: 11/30/2019 CLINICAL DATA:  Transient episode of altered mental status during the middle the night. Patient is returned to baseline but is amnestic of the event. EXAM: CT ANGIOGRAPHY HEAD AND NECK TECHNIQUE: Multidetector CT imaging of the head and neck was performed using the standard protocol during bolus administration of intravenous contrast. Multiplanar CT image reconstructions and MIPs were obtained to evaluate the vascular anatomy. Carotid stenosis measurements (when applicable) are obtained utilizing NASCET criteria, using the distal internal carotid diameter as the denominator. CONTRAST:  54mL  OMNIPAQUE IOHEXOL 350 MG/ML SOLN COMPARISON:  CT head without contrast 11/30/2019. MR head without contrast 11/30/2019 FINDINGS: CT HEAD FINDINGS Brain: Remote cortical infarcts of the posterior right frontal lobe and anterior right parietal lobe are again noted. Atrophy and white matter disease are stable. No acute intracranial abnormality or significant interval changes present. Vascular: Vascular calcifications are again seen. Skull: Calvarium is intact. No focal lytic or blastic lesions are present. No significant extracranial soft tissue lesion is present. Sinuses: Right middle ear and mastoid effusion is present. The paranasal sinuses and mastoid air cells are otherwise clear. Orbits: The globes and orbits are within normal limits. Review of the MIP images confirms the above findings CTA NECK FINDINGS Aortic arch: A 3 vessel arch configuration is present. Atherosclerotic changes are present in the aorta. There is no aneurysm. Extensive mural plaque is present in the proximal left subclavian artery without a significant stenosis. Right carotid system: The right common carotid artery is within normal limits. Dense atherosclerotic calcifications are present at the right carotid bifurcation. Lumen is narrowed 1.2 mm. The more distal vessel measures 4 mm. There is slight irregularity within the cervical right ICA without a significant stenosis. Left carotid system: The left common carotid artery is within normal limits. Atherosclerotic calcifications are present at the carotid bifurcation. Mild irregularity is present within the more distal cervical left ICA without a significant stenosis. Vertebral arteries: The left vertebral artery is slightly dominant to the right. Both vertebral arteries originate from the subclavian arteries without significant stenosis. There is no significant stenosis of either vertebral artery in the neck. Skeleton: Multilevel degenerative changes are noted. Degenerative anterolisthesis  present C3-4 and C4-5. Chronic endplate changes and uncovertebral spurring is worse at C5-6 than C6-7. No focal lytic or blastic lesions are present. Vertebral body heights are maintained. Other neck: No focal mucosal or submucosal lesions are present. Salivary glands are within normal limits. No significant adenopathy is present. The thyroid is within normal limits. Upper chest: Mild dependent atelectasis is present. Upper lung fields are clear. Thoracic inlet is within normal limits. Review of the MIP images confirms the above findings CTA HEAD FINDINGS Anterior circulation: Atherosclerotic changes are present within the cavernous internal carotid arteries bilaterally. There is no significant stenosis through the ICA termini. The A1 and M1 segments are normal. The anterior communicating artery is patent. MCA bifurcations are within normal limits. The ACA and MCA branch vessels are unremarkable. Posterior circulation: Atherosclerotic changes are present at the dural margin. Moderate left and mild right stenosis is present. The more distal V4 segments are within normal limits. Vertebrobasilar junction is normal. The PICA origins  are visualized and normal. Both posterior cerebral arteries originate from the basilar tip. PCA branch vessels are within normal limits. Venous sinuses: Dural sinuses are patent. The right transverse sinus is dominant. The straight sinus and deep cerebral veins are intact. Cortical veins are unremarkable. Anatomic variants: None Review of the MIP images confirms the above findings IMPRESSION: 1. No emergent large vessel occlusion 2. Atherosclerotic changes at the carotid bifurcations bilaterally with approximately 70% stenosis of the right internal carotid artery at the bifurcation. 3. Beaded irregularity of the cervical internal carotid arteries bilaterally raises concern for FMD. 4. Atherosclerotic changes within the cavernous internal carotid arteries bilaterally without significant  stenosis. 5. Moderate left and mild right V4 segment stenosis. 6. Multilevel spondylosis of the cervical spine. 7. Remote cortical infarcts of the right frontal and parietal lobes. 8. Right middle ear and mastoid effusion. Electronically Signed   By: San Morelle M.D.   On: 11/30/2019 13:17   MR BRAIN WO CONTRAST  Result Date: 11/30/2019 CLINICAL DATA:  Seizure, nontraumatic. Age over 42. Altered mental status. EXAM: MRI HEAD WITHOUT CONTRAST TECHNIQUE: Multiplanar, multiecho pulse sequences of the brain and surrounding structures were obtained without intravenous contrast. COMPARISON:  CT head without contrast 11/30/2019 FINDINGS: Brain: No acute infarct, hemorrhage, or mass lesion is present. Moderate atrophy and diffuse white matter disease is present bilaterally. Remote cortical and subcortical infarcts are present in the posterior right frontal lobe and anterior right parietal lobe near the region of the hand portion of the homunculus. Confluent periventricular and scattered subcortical T2 hyperintensities are present elsewhere without cortical involvement. A remote lacunar infarct is present in the left cerebellum. Remote lacunar infarcts are present in the thalami bilaterally. Dilated perivascular spaces are present through the basal ganglia. The ventricles are proportionate to the degree of atrophy. No significant extraaxial fluid collection is present. Temporal lobes are unremarkable. No focal mass lesion present. No significant signal changes are present. Vascular: Flow is present in the major intracranial arteries. Skull and upper cervical spine: The craniocervical junction is normal. Upper cervical spine is within normal limits. Marrow signal is unremarkable. Sinuses/Orbits: A right middle ear and mastoid effusion is present. No obstructing nasopharyngeal lesion is present. Left mastoid air cells are clear. Minimal mucosal thickening is present at floor of the right maxillary sinus. The right  frontal sinus is not pneumatized. The paranasal sinuses and mastoid air cells are otherwise clear. The globes and orbits are within normal limits. IMPRESSION: 1. No acute or focal abnormality to explain the patient's symptoms. 2. Moderate atrophy and diffuse white matter disease likely reflects the sequela of chronic microvascular ischemia. 3. Remote cortical and subcortical infarcts involving the posterior right frontal lobe and anterior right parietal lobe. 4. Remote lacunar infarcts of the thalami bilaterally and the left cerebellum. 5. Right middle ear and mastoid effusion. No obstructing nasopharyngeal lesion is present. Infection is not excluded. Electronically Signed   By: San Morelle M.D.   On: 11/30/2019 06:57   CT HEAD CODE STROKE WO CONTRAST  Result Date: 11/30/2019 CLINICAL DATA:  Code stroke. Initial evaluation for acute unresponsiveness. EXAM: CT HEAD WITHOUT CONTRAST TECHNIQUE: Contiguous axial images were obtained from the base of the skull through the vertex without intravenous contrast. COMPARISON:  None. FINDINGS: Brain: Generalized age-related cerebral atrophy with chronic small vessel ischemic disease. No acute intracranial hemorrhage. No acute large vessel territory infarct. No mass lesion, midline shift or mass effect. No hydrocephalus. No extra-axial fluid collection. Vascular: No hyperdense vessel. Calcified atherosclerosis present  at skull base. Skull: Scalp soft tissues and calvarium within normal limits. Sinuses/Orbits: Globes and orbital soft tissues normal. Paranasal sinuses are clear. Chronic opacification of the right mastoid air cells and middle ear cavity. Visualized nasopharynx grossly unremarkable. Other: None. ASPECTS [facility] Stroke Program Early CT Score) - Ganglionic level infarction (caudate, lentiform nuclei, internal capsule, insula, M1-M3 cortex): 7 - Supraganglionic infarction (M4-M6 cortex): 3 Total score (0-10 with 10 being normal): 10 IMPRESSION: 1. No  acute intracranial infarct or other abnormality. 2. ASPECTS is 10. 3. Age-related cerebral atrophy with chronic small vessel ischemic disease. 4. Chronic right mastoid effusion. Critical Value/emergent results were called by telephone at the time of interpretation on 11/30/2019 at 5:15 am to [place] , who verbally acknowledged these results. Electronically Signed   By: Jeannine Boga M.D.   On: 11/30/2019 05:17     ____________________________________________   PROCEDURES  Procedure(s) performed: None Procedures Critical Care performed:  None ____________________________________________   INITIAL IMPRESSION / ASSESSMENT AND PLAN / ED COURSE  86 y.o. male with history of CVA, B12 deficiency anemia, iron deficiency anemia, hypertension, ITP who presents for altered mental status.  Patient found by his wife this morning breathing 3-4 times a minute, unresponsive, incontinent of urine.  Mental status started to clear up in route to the hospital and after 10 minutes in the emergency room patient was alert and oriented answering questions.  Has no recollection of what happened.  Seems like patient had a seizure and was postictal on arrival.  His head CT shows no acute abnormalities.  Patient is otherwise now neurologically intact with a GCS of 15.  According to his wife patient had seizures in 2011 after having a stroke but has not had any before that or since.  He is not on any medications for it.  We will get an MRI to rule out ischemic stroke.  Anticipate admission to the hospital.  Labs showing no hyponatremia, no hypoglycemia or any other electrolyte abnormalities to justify his seizure.       As part of my medical decision making, I reviewed the following data within the [place] History obtained from family, Nursing notes reviewed and incorporated, Labs reviewed , EKG interpreted , Old chart reviewed, Radiograph reviewed , Discussed with admitting  physician , Notes from prior ED visits and [place] Controlled Substance Database   Please note:  Patient was evaluated in Emergency Department today for the symptoms described in the history of present illness. Patient was evaluated in the context of the global COVID-19 pandemic, which necessitated consideration that the patient might be at risk for infection with the SARS-CoV-2 virus that causes COVID-19. Institutional protocols and algorithms that pertain to the evaluation of patients at risk for COVID-19 are in a state of rapid change based on information released by regulatory bodies including the CDC and federal and state organizations. These policies and algorithms were followed during the patient's care in the ED.  Some ED evaluations and interventions may be delayed as a result of limited staffing during the pandemic.   ____________________________________________   FINAL CLINICAL IMPRESSION(S) / ED DIAGNOSES   Final diagnoses:  Seizure ([place])      NEW MEDICATIONS STARTED DURING THIS VISIT:  ED Discharge Orders    None       Note:  This document was prepared using Dragon voice recognition software and may include unintentional dictation errors.    Alfred Levins, [place], MD 11/30/19 [phone]

## 2019-11-30 NOTE — Progress Notes (Signed)
Patient alseep in bed. No respirtory distress or anything noted at this time.

## 2019-11-30 NOTE — Consult Note (Addendum)
Requesting Physician: Dr. Blaine Hamper    Chief Complaint: [name]  History obtained from: Patient and Chart    HPI:                                                                                                                                       [name] is a 86 y.o. male with past medical history of hypertension, prior CVA with no residual deficits, single seizure, idiopathic thrombocytopenia, BPH, CKD, vitamin b12 deficiency who presents to the emergency department after being found unresponsive by his wife . Wife states that 3:30 AM, she woke up to hear her husband making strange and audible noises.  She tried to wake him up however she was unable to.  She called EMS and when EMS arrived patient noted to have agonal breathing, also noted to have urinated on himself.  Patient brought to the ED, his altered mental status started to slightly improve GCS of 7 but nonverbal.  However after few minutes patient started to talk and move with some confusion. Wife states that she did not notice any posturing, his eyes were closed.  No jerking movements noted.  No tongue bite.  Patient now on assessment is back to his baseline, NIH stroke scale is 0.  Patient does not recall the event.  Pressure significantly elevated in the 1 14-7 90 systolic range.  Patient takes aspirin daily at home.  He recently prescribed steroids for pruritis (? Eczema).   Date last known well: 11/29/19 tPA Given: no, symptoms resolved NIHSS: 0 Baseline MRS 0   Past Medical History:  Diagnosis Date  . Allergic rhinitis   . Arthropathy   . B12 deficiency anemia   . Chronic neck pain   . CVA (cerebral infarction)   . GERD (gastroesophageal reflux disease)   . History of seizures   . HTN (hypertension)   . IDA (iron deficiency anemia)   . ITP (idiopathic thrombocytopenic purpura) 07/16/14    Past Surgical History:  Procedure Laterality Date  . CHOLECYSTECTOMY  10/13/1988  . COLONOSCOPY  10/2014, 08/2014   Dr.  Gwenlyn Perking  . UPPER GI ENDOSCOPY      Family History  Problem Relation Age of Onset  . Heart disease Unknown   . Cirrhosis Mother    Social History:  reports that he has never smoked. He has never used smokeless tobacco. He reports that he does not drink alcohol or use drugs.  Allergies:  Allergies  Allergen Reactions  . Meperidine Hives    Medications:  I reviewed home medications   ROS:                                                                                                                                     14 systems reviewed and negative except above    Examination:                                                                                                      General: Appears well-developed  Psych: Affect appropriate to situation Eyes: No scleral injection HENT: No OP obstrucion Head: Normocephalic.  Cardiovascular: Normal rate and regular rhythm. Respiratory: Effort normal and breath sounds normal to anterior ascultation GI: Soft.  No distension. There is no tenderness.  Skin: WDI    Neurological Examination Mental Status: Alert, oriented, thought content appropriate.  Speech fluent without evidence of aphasia. Able to follow 3 step commands without difficulty. Cranial Nerves: II: Visual fields grossly normal,  III,IV, VI: ptosis not present, extra-ocular motions intact bilaterally, pupils equal, round, reactive to light and accommodation V,VII: smile symmetric, facial light touch sensation normal bilaterally VIII: hearing normal bilaterally IX,X: uvula rises symmetrically XI: bilateral shoulder shrug XII: midline tongue extension Motor: Right : Upper extremity   5/5    Left:     Upper extremity   5/5  Lower extremity   5/5     Lower extremity   5/5 Tone and bulk:normal tone throughout; no atrophy noted Sensory: Pinprick and light  touch intact throughout, bilaterally Deep Tendon Reflexes: 2+ and symmetric throughout Plantars: Right: downgoing   Left: downgoing Cerebellar: normal finger-to-nose, normal rapid alternating movements and normal heel-to-shin test Gait: normal gait and station     Lab Results: Basic Metabolic Panel: Recent Labs  Lab 11/30/19 0449  NA 140  K 3.6  CL 104  CO2 25  GLUCOSE 126*  BUN 23  CREATININE 1.29*  CALCIUM 8.4*    CBC: Recent Labs  Lab 11/30/19 0449  WBC 12.3*  NEUTROABS 9.3*  HGB 12.3*  HCT 37.4*  MCV 89.9  PLT 143*    Coagulation Studies: Recent Labs    11/30/19 0651  LABPROT 12.4  INR 0.9    Imaging: MR BRAIN WO CONTRAST  Result Date: 11/30/2019 CLINICAL DATA:  Seizure, nontraumatic. Age over 34. Altered mental status. EXAM: MRI HEAD WITHOUT CONTRAST TECHNIQUE: Multiplanar, multiecho pulse sequences of the brain and surrounding structures were obtained without intravenous contrast. COMPARISON:  CT head without contrast 11/30/2019 FINDINGS: Brain: No acute infarct,  hemorrhage, or mass lesion is present. Moderate atrophy and diffuse white matter disease is present bilaterally. Remote cortical and subcortical infarcts are present in the posterior right frontal lobe and anterior right parietal lobe near the region of the hand portion of the homunculus. Confluent periventricular and scattered subcortical T2 hyperintensities are present elsewhere without cortical involvement. A remote lacunar infarct is present in the left cerebellum. Remote lacunar infarcts are present in the thalami bilaterally. Dilated perivascular spaces are present through the basal ganglia. The ventricles are proportionate to the degree of atrophy. No significant extraaxial fluid collection is present. Temporal lobes are unremarkable. No focal mass lesion present. No significant signal changes are present. Vascular: Flow is present in the major intracranial arteries. Skull and upper cervical  spine: The craniocervical junction is normal. Upper cervical spine is within normal limits. Marrow signal is unremarkable. Sinuses/Orbits: A right middle ear and mastoid effusion is present. No obstructing nasopharyngeal lesion is present. Left mastoid air cells are clear. Minimal mucosal thickening is present at floor of the right maxillary sinus. The right frontal sinus is not pneumatized. The paranasal sinuses and mastoid air cells are otherwise clear. The globes and orbits are within normal limits. IMPRESSION: 1. No acute or focal abnormality to explain the patient's symptoms. 2. Moderate atrophy and diffuse white matter disease likely reflects the sequela of chronic microvascular ischemia. 3. Remote cortical and subcortical infarcts involving the posterior right frontal lobe and anterior right parietal lobe. 4. Remote lacunar infarcts of the thalami bilaterally and the left cerebellum. 5. Right middle ear and mastoid effusion. No obstructing nasopharyngeal lesion is present. Infection is not excluded. Electronically Signed   By: San Morelle M.D.   On: 11/30/2019 06:57   CT HEAD CODE STROKE WO CONTRAST  Result Date: 11/30/2019 CLINICAL DATA:  Code stroke. Initial evaluation for acute unresponsiveness. EXAM: CT HEAD WITHOUT CONTRAST TECHNIQUE: Contiguous axial images were obtained from the base of the skull through the vertex without intravenous contrast. COMPARISON:  None. FINDINGS: Brain: Generalized age-related cerebral atrophy with chronic small vessel ischemic disease. No acute intracranial hemorrhage. No acute large vessel territory infarct. No mass lesion, midline shift or mass effect. No hydrocephalus. No extra-axial fluid collection. Vascular: No hyperdense vessel. Calcified atherosclerosis present at skull base. Skull: Scalp soft tissues and calvarium within normal limits. Sinuses/Orbits: Globes and orbital soft tissues normal. Paranasal sinuses are clear. Chronic opacification of the right  mastoid air cells and middle ear cavity. Visualized nasopharynx grossly unremarkable. Other: None. ASPECTS [facility] Stroke Program Early CT Score) - Ganglionic level infarction (caudate, lentiform nuclei, internal capsule, insula, M1-M3 cortex): 7 - Supraganglionic infarction (M4-M6 cortex): 3 Total score (0-10 with 10 being normal): 10 IMPRESSION: 1. No acute intracranial infarct or other abnormality. 2. ASPECTS is 10. 3. Age-related cerebral atrophy with chronic small vessel ischemic disease. 4. Chronic right mastoid effusion. Critical Value/emergent results were called by telephone at the time of interpretation on 11/30/2019 at 5:15 am to [place] , who verbally acknowledged these results. Electronically Signed   By: Jeannine Boga M.D.   On: 11/30/2019 05:17     ASSESSMENT AND PLAN  86 y.o. male with past medical history of hypertension, prior CVA with no residual deficits, single seizure, idiopathic thrombocytopenia, BPH, CKD, vitamin b12 deficiency who presents to the emergency department after being found unresponsive by his wife.  Based on description of events, it is possible patient could have had a seizure which woke his wife up and then was in a postictal  state when EMS arrived, and then gradually returned to normal.  If this were to be a seizure, it would be his second lifetime seizure with no provoking etiology and will start him on antiepileptic agent.  Alternative explanations patient could have had a posterior circulation TIA, such as a basilar artery thrombus which then recanalized.  Patient does have significantly elevated blood pressure which could favor this explanation  Seizure with postictal state versus posterior circulation transient ischemic attack  Recommendations MRI brain without contrast CT of the head and neck Echocardiogram Lipid profile and A1c Telemetry monitoring to assess for paroxysmal A. Fib, if negative recommend 30 day loop monitor on  d/c BP goal permissive upto <185/110 mmHg Frequent neurochecks every 4 hours Swallow evaluation before starting diet We will empirically start patient on Keppra 500 mg twice daily and routine EEG (if not completed by Monday, this can be done as outpatient)   Karena Addison Tristram Milian [facility] Pager Number [phone]

## 2019-12-01 ENCOUNTER — Inpatient Hospital Stay
Admit: 2019-12-01 | Discharge: 2019-12-01 | Disposition: A | Discharge status: Home / Self Care | Payer: PPO | Attending: Internal Medicine | Admitting: Internal Medicine

## 2019-12-01 DIAGNOSIS — R569 Unspecified convulsions: Principal | ICD-10-CM

## 2019-12-01 DIAGNOSIS — E785 Hyperlipidemia, unspecified: Secondary | ICD-10-CM | POA: Diagnosis present

## 2019-12-01 DIAGNOSIS — K219 Gastro-esophageal reflux disease without esophagitis: Secondary | ICD-10-CM | POA: Diagnosis present

## 2019-12-01 DIAGNOSIS — N1831 Chronic kidney disease, stage 3a: Secondary | ICD-10-CM

## 2019-12-01 DIAGNOSIS — R001 Bradycardia, unspecified: Secondary | ICD-10-CM | POA: Diagnosis present

## 2019-12-01 DIAGNOSIS — I639 Cerebral infarction, unspecified: Secondary | ICD-10-CM

## 2019-12-01 DIAGNOSIS — D72829 Elevated white blood cell count, unspecified: Secondary | ICD-10-CM

## 2019-12-01 LAB — BASIC METABOLIC PANEL
Anion gap: 9 (ref 5–15)
BUN: 19 mg/dL (ref 8–23)
CO2: 26 mmol/L (ref 22–32)
Calcium: 8.5 mg/dL — ABNORMAL LOW (ref 8.9–10.3)
Chloride: 104 mmol/L (ref 98–111)
Creatinine, Ser: 1 mg/dL (ref 0.61–1.24)
GFR calc Af Amer: >60 mL/min (ref >60)
GFR calc non Af Amer: >60 mL/min (ref >60)
Glucose, Bld: 79 mg/dL (ref 70–99)
Potassium: 3.6 mmol/L (ref 3.5–5.1)
Sodium: 139 mmol/L (ref 135–145)

## 2019-12-01 LAB — CBC
HCT: 37.8 % — ABNORMAL LOW (ref 39.0–52.0)
Hemoglobin: 12.5 g/dL — ABNORMAL LOW (ref 13.0–17.0)
MCH: 29.6 pg (ref 26.0–34.0)
MCHC: 33.1 g/dL (ref 30.0–36.0)
MCV: 89.6 fL (ref 80.0–100.0)
Platelets: 151 10*3/uL (ref 150–400)
RBC: 4.22 MIL/uL (ref 4.22–5.81)
RDW: 14.6 % (ref 11.5–15.5)
WBC: 10.4 10*3/uL (ref 4.0–10.5)
nRBC: 0 % (ref 0.0–0.2)

## 2019-12-01 LAB — LIPID PANEL
Cholesterol: 145 mg/dL (ref 0–200)
HDL: 58 mg/dL (ref >40)
LDL Cholesterol: 70 mg/dL (ref 0–99)
Total CHOL/HDL Ratio: 2.5 RATIO
Triglycerides: 87 mg/dL (ref <150)
VLDL: 17 mg/dL (ref 0–40)

## 2019-12-01 LAB — GLUCOSE, CAPILLARY
Glucose-Capillary: 57 mg/dL — ABNORMAL LOW (ref 70–99)
Glucose-Capillary: 85 mg/dL (ref 70–99)
Glucose-Capillary: 88 mg/dL (ref 70–99)

## 2019-12-01 LAB — ECHOCARDIOGRAM COMPLETE
Height: 70 in
Weight: 2880 oz

## 2019-12-01 LAB — HEMOGLOBIN A1C
Hgb A1c MFr Bld: 5.9 % — ABNORMAL HIGH (ref 4.8–5.6)
Mean Plasma Glucose: 122.63 mg/dL

## 2019-12-01 MED ORDER — PERFLUTREN LIPID MICROSPHERE
1.0000 mL | INTRAVENOUS | Status: AC | PRN
  Administered 2019-12-01: 11:00:00 2 mL via INTRAVENOUS
  Filled 2019-12-01: qty 10

## 2019-12-01 MED ORDER — ATORVASTATIN CALCIUM 10 MG PO TABS: 10.0000 mg | ORAL_TABLET | Freq: Every day | ORAL | 11 refills | Status: DC

## 2019-12-01 MED ORDER — LEVETIRACETAM 500 MG PO TABS: 500.0000 mg | ORAL_TABLET | Freq: Two times a day (BID) | ORAL | 1 refills | Status: DC

## 2019-12-01 MED ORDER — LEVETIRACETAM 500 MG PO TABS: 500.0000 mg | ORAL_TABLET | Freq: Two times a day (BID) | ORAL | Status: DC

## 2019-12-01 NOTE — Procedures (Signed)
ELECTROENCEPHALOGRAM REPORT   Patient: [name]       Room #: VO59Y EEG No. ID: [id number] Age: 86 y.o.        Sex: male Referring Physician: [place] Pink Report Date:  12/01/2019        Interpreting Physician: Alexis Goodell  History: [name] is an 86 y.o. male presenting with altered mental status  Medications:  Keppra, ASA, Protonix, Flomax  Conditions of Recording:  This is a 21 channel routine scalp EEG performed with bipolar and monopolar montages arranged in accordance to the international 10/20 system of electrode placement. One channel was dedicated to EKG recording.  The patient is in the awake and drowsy states.  Description:  The waking background activity consists of a low voltage, symmetrical, fairly well organized, 8-9 Hz alpha activity, seen from the parieto-occipital and posterior temporal regions.  Low voltage fast activity, poorly organized, is seen anteriorly and is at times superimposed on more posterior regions.  A mixture of theta and alpha rhythms are seen from the central and temporal regions. The patient drowses with slowing to irregular, low voltage theta and beta activity.   Stage II sleep is not obtained. No epileptiform activity is noted.   Hyperventilation was not performed.  Intermittent photic stimulation was performed but failed to illicit any change in the tracing.    IMPRESSION: Normal electroencephalogram, awake, drowsy and with activation procedures.   Alexis Goodell, MD Neurology [phone] 12/01/2019, 1:10 PM

## 2019-12-01 NOTE — ED Notes (Signed)
Report to collyn, rn 

## 2019-12-01 NOTE — Discharge Instructions (Signed)
Seizure, Adult A seizure is a sudden burst of abnormal electrical activity in the brain. Seizures usually last from 30 seconds to 2 minutes. They can cause many different symptoms. Usually, seizures are not harmful unless they last a long time. What are the causes? Common causes of this condition include:  Fever or infection.  Conditions that affect the brain, such as: ? A brain abnormality that you were born with. ? A brain or head injury. ? Bleeding in the brain. ? A tumor. ? Stroke. ? Brain disorders such as autism or cerebral palsy.  Low blood sugar.  Conditions that are passed from parent to child (are inherited).  Problems with substances, such as: ? Having a reaction to a drug or a medicine. ? Suddenly stopping the use of a substance (withdrawal). In some cases, the cause may not be known. A person who has repeated seizures over time without a clear cause has a condition called epilepsy. What increases the risk? You are more likely to get this condition if you have:  A family history of epilepsy.  Had a seizure in the past.  A brain disorder.  A history of head injury, lack of oxygen at birth, or strokes. What are the signs or symptoms? There are many types of seizures. The symptoms vary depending on the type of seizure you have. Examples of symptoms during a seizure include:  Shaking (convulsions).  Stiffness in the body.  Passing out (losing consciousness).  Head nodding.  Staring.  Not responding to sound or touch.  Loss of bladder control and bowel control. Some people have symptoms right before and right after a seizure happens. Symptoms before a seizure may include:  Fear.  Worry (anxiety).  Feeling like you may vomit (nauseous).  Feeling like the room is spinning (vertigo).  Feeling like you saw or heard something before (dj vu).  Odd tastes or smells.  Changes in how you see. You may see flashing lights or spots. Symptoms after a  seizure happens can include:  Confusion.  Sleepiness.  Headache.  Weakness on one side of the body. How is this treated? Most seizures will stop on their own in under 5 minutes. In these cases, no treatment is needed. Seizures that last longer than 5 minutes will usually need treatment. Treatment can include:  Medicines given through an IV tube.  Avoiding things that are known to cause your seizures. These can include medicines that you take for another condition.  Medicines to treat epilepsy.  Surgery to stop the seizures. This may be needed if medicines do not help. Follow these instructions at home: Medicines  Take over-the-counter and prescription medicines only as told by your doctor.  Do not eat or drink anything that may keep your medicine from working, such as alcohol. Activity  Do not do any activities that would be dangerous if you had another seizure, like driving or swimming. Wait until your doctor says it is safe for you to do them.  If you live in the [place] ask your local DMV (department of motor vehicles) when you can drive.  Get plenty of rest. Teaching others Teach friends and family what to do when you have a seizure. They should:  Lay you on the ground.  Protect your head and body.  Loosen any tight clothing around your neck.  Turn you on your side.  Not hold you down.  Not put anything into your mouth.  Know whether or not you need emergency care.  Stay   with you until you are better.  General instructions  Contact your doctor each time you have a seizure.  Avoid anything that gives you seizures.  Keep a seizure diary. Write down: ? What you think caused each seizure. ? What you remember about each seizure.  Keep all follow-up visits as told by your doctor. This is important. Contact a doctor if:  You have another seizure.  You have seizures more often.  There is any change in what happens during your seizures.  You keep having  seizures with treatment.  You have symptoms of being sick or having an infection. Get help right away if:  You have a seizure that: ? Lasts longer than 5 minutes. ? Is different than seizures you had before. ? Makes it harder to breathe. ? Happens after you hurt your head.  You have any of these symptoms after a seizure: ? Not being able to speak. ? Not being able to use a part of your body. ? Confusion. ? A bad headache.  You have two or more seizures in a row.  You do not wake up right after a seizure.  You get hurt during a seizure. These symptoms may be an emergency. Do not wait to see if the symptoms will go away. Get medical help right away. Call your local emergency services (911 in the [place] Do not drive yourself to the hospital. Summary  Seizures usually last from 30 seconds to 2 minutes. Usually, they are not harmful unless they last a long time.  Do not eat or drink anything that may keep your medicine from working, such as alcohol.  Teach friends and family what to do when you have a seizure.  Contact your doctor each time you have a seizure. This information is not intended to replace advice given to you by your health care provider. Make sure you discuss any questions you have with your health care provider. Document Revised: 01/17/2019 Document Reviewed: 01/17/2019 Elsevier Patient Education  [place].  Bradycardia, Adult Bradycardia is a slower-than-normal heartbeat. A normal resting heart rate for an adult ranges from 60 to 100 beats per minute. With bradycardia, the resting heart rate is less than 60 beats per minute. Bradycardia can prevent enough oxygen from reaching certain areas of your body when you are active. It can be serious if it keeps enough oxygen from reaching your brain and other parts of your body. Bradycardia is not a problem for everyone. For some healthy adults, a slow resting heart rate is normal. What are the causes? This  condition may be caused by:  A problem with the heart, including: ? A problem with the heart's electrical system, such as a heart block. With a heart block, electrical signals between the chambers of the heart are partially or completely blocked, so they are not able to work as they should. ? A problem with the heart's natural pacemaker (sinus node). ? Heart disease. ? A heart attack. ? Heart damage. ? Lyme disease. ? A heart infection. ? A heart condition that is present at birth (congenital heart defect).  Certain medicines that treat heart conditions.  Certain conditions, such as hypothyroidism and obstructive sleep apnea.  Problems with the balance of chemicals and other substances, like potassium, in the blood.  Trauma.  Radiation therapy. What increases the risk? You are more likely to develop this condition if you:  Are age 87 or older.  Have high blood pressure (hypertension), high cholesterol (hyperlipidemia), or diabetes.  Drink heavily, use tobacco or nicotine products, or use drugs. What are the signs or symptoms? Symptoms of this condition include:  Light-headedness.  Feeling faint or fainting.  Fatigue and weakness.  Trouble with activity or exercise.  Shortness of breath.  Chest pain (angina).  Drowsiness.  Confusion.  Dizziness. How is this diagnosed? This condition may be diagnosed based on:  Your symptoms.  Your medical history.  A physical exam. During the exam, your health care provider will listen to your heartbeat and check your pulse. To confirm the diagnosis, your health care provider may order tests, such as:  Blood tests.  An electrocardiogram (ECG). This test records the heart's electrical activity. The test can show how fast your heart is beating and whether the heartbeat is steady.  A test in which you wear a portable device (event recorder or Holter monitor) to record your heart's electrical activity while you go about your  day.  Anexercise test. How is this treated? Treatment for this condition depends on the cause of the condition and how severe your symptoms are. Treatment may involve:  Treatment of the underlying condition.  Changing your medicines or how much medicine you take.  Having a small, battery-operated device called a pacemaker implanted under the skin. When bradycardia occurs, this device can be used to increase your heart rate and help your heart beat in a regular rhythm. Follow these instructions at home: Lifestyle   Manage any health conditions that contribute to bradycardia as told by your health care provider.  Follow a heart-healthy diet. A nutrition specialist (dietitian) can help educate you about healthy food options and changes.  Follow an exercise program that is approved by your health care provider.  Maintain a healthy weight.  Try to reduce or manage your stress, such as with yoga or meditation. If you need help reducing stress, ask your health care provider.  Do not use any products that contain nicotine or tobacco, such as cigarettes, e-cigarettes, and chewing tobacco. If you need help quitting, ask your health care provider.  Do not use illegal drugs.  Limit alcohol intake to no more than 1 drink a day for nonpregnant women and 2 drinks a day for men. Be aware of how much alcohol is in your drink. In the [place] one drink equals one 12 oz bottle of beer (355 mL), one 5 oz glass of wine (148 mL), or one 1 oz glass of hard liquor (44 mL). General instructions  Take over-the-counter and prescription medicines only as told by your health care provider.  Keep all follow-up visits as told by your health care provider. This is important. How is this prevented? In some cases, bradycardia may be prevented by:  Treating underlying medical problems.  Stopping behaviors or medicines that can trigger the condition. Contact a health care provider if you:  Feel light-headed or  dizzy.  Almost faint.  Feel weak or are easily fatigued during physical activity.  Experience confusion or have memory problems. Get help right away if:  You faint.  You have: ? An irregular heartbeat (palpitations). ? Chest pain. ? Trouble breathing. Summary  Bradycardia is a slower-than-normal heartbeat. With bradycardia, the resting heart rate is less than 60 beats per minute.  Treatment for this condition depends on the cause.  Manage any health conditions that contribute to bradycardia as told by your health care provider.  Do not use any products that contain nicotine or tobacco, such as cigarettes, e-cigarettes, and chewing tobacco, and limit  alcohol intake.  Keep all follow-up visits as told by your health care provider. This is important. This information is not intended to replace advice given to you by your health care provider. Make sure you discuss any questions you have with your health care provider. Document Revised: 05/13/2018 Document Reviewed: 04/10/2018 Elsevier Patient Education  [place].

## 2019-12-01 NOTE — Discharge Summary (Signed)
Discharge Summary  [name] AUQ:[id number] DOB: January 16, 1932  PCP: Lynnell Jude, MD  Admit date: 11/30/2019 Discharge date: 12/01/2019  Time spent: 45 minutes  Recommendations for Outpatient Follow-up:  1. New medication: Keppra 500 twice daily 2. New medication: Lipitor 10 mg p.o. daily 3. Patient given referral to follow-up with cardiology for 30-day event monitor  Discharge Diagnoses:  Active Hospital Problems   Diagnosis Date Noted  . Acute metabolic encephalopathy 63/89/3734  . Bradycardia 12/01/2019  . GERD (gastroesophageal reflux disease) 12/01/2019  . Hyperlipidemia 12/01/2019  . Seizure ([place]) 11/30/2019  . Stroke ([place]) 11/30/2019  . CKD (chronic kidney disease), stage IIIa 11/30/2019  . BPH (benign prostatic hyperplasia) 11/30/2019  . Leukocytosis 11/30/2019  . IDA (iron deficiency anemia) 08/10/2014    Resolved Hospital Problems  No resolved problems to display.    Discharge Condition: Improved, being discharged home  Diet recommendation: Heart healthy  Vitals:   12/01/19 1115 12/01/19 1452  BP:  (!) 123/91  Pulse:  89  Resp: 16 18  Temp:    SpO2:  100%    History of present illness:  86 year old male with past medical history of hypertension, stroke, GERD and BPH plus stage III chronic kidney disease who presented to the emergency room on the early morning hours of 1/17 after he was found by his wife making strange noises at approximately 3:00 in the morning.  His wife tried to wake him, but he was unresponsive.  He was also noted to be incontinent of urine.  EMS reported agonal breathing with apnea.  He was not hypoxic.  When patient was brought into the emergency room at that point, he was alert and oriented x3.  CT scan of the head was unremarkable.  Lab work noted no evidence of acute infection or electrolyte abnormalities.  He had a previous stroke in 2011 without any deficits and reportedly had seizures after that but has not on any seizure  medications.  His renal function appeared to be normal.  MRI of the brain noted no evidence of acute CVA.  Neurology was consulted.  Hospital Course:  Principal Problem:   Acute metabolic encephalopathy, suspected seizure: Unclear etiology.  Patient's EEG was unremarkable.  He also had a stroke work-up which CT angiogram of neck noted evidence of some plaque on his right carotid artery.  Echocardiogram unremarkable.  As per neurology recommendations, added statin to his regimen as well as Keppra twice daily.  Following arrival to emergency room, symptoms completely resolved and he has had no problems since. Active Problems:   IDA (iron deficiency anemia): Stable     Stroke [facility] No evidence of new CVA.  As above.  Continue aspirin.    CKD (chronic kidney disease), stage IIIa: Stable, at baseline.    BPH (benign prostatic hyperplasia): Patient supposed to be on Flomax.  According to his wife, he is no longer on this medication because he likely hit the medication from her so that he would not have to take it and now neither he nor she can find it.  She was amenable to me giving her a new fresh prescription for this medication.    Leukocytosis: Mildly elevated white blood cell count on admission.  Reactive?  Normalized by following day.    Bradycardia: It was noted during patient's hospitalization that his heart rate was in the low 50s and at times down into the high 40s.  Its possible that he may have had some episodes of transient hypoperfusion of  his brain due to bradycardia.  This would be more of a diagnosis of exclusion.  No evidence of ischemic stroke seen on MRI.  Gave referral to cardiology as outpatient for 30-day monitor.  TSH ordered and pending at time of discharge.  Patient is not on any chronotropic agents.    GERD (gastroesophageal reflux disease): He will continue home PPI.    Hyperlipidemia: Given previous CVA and stroke work-up revealed mildly elevated LDL.  Started on low-dose  Lipitor.   Procedures:  Echocardiogram: Hyperdynamic ejection fraction of 75%.  No comment on valvular or diastolic dysfunction  CT angiogram of head and neck noted atherosclerotic changes of the carotid bifurcations bilaterally with approximately 70% stenosis of the right ICA at bifurcation and beaded irregularity of cervical internal carotid arteries bilaterally raises concerns for FMD.  Moderate left and mild right V4 segment stenosis.  Remote cortical infarcts of the right frontal and parietal lobes.  Consultations:  Neurology  Discharge Exam: BP (!) 123/91 (BP Location: Right Arm)   Pulse 89   Temp 98 F (36.7 C) (Oral)   Resp 18   Ht 5\' 10"  (1.778 m)   Wt 81.6 kg   SpO2 100%   BMI 25.83 kg/m   General: Alert and oriented x3 Cardiovascular: Regular rate and rhythm, S1-S2 Respiratory: Clear to auscultation bilaterally  Discharge Instructions You were cared for by a hospitalist during your hospital stay. If you have any questions about your discharge medications or the care you received while you were in the hospital after you are discharged, you can call the unit and asked to speak with the hospitalist on call if the hospitalist that took care of you is not available. Once you are discharged, your primary care physician will handle any further medical issues. Please note that NO REFILLS for any discharge medications will be authorized once you are discharged, as it is imperative that you return to your primary care physician (or establish a relationship with a primary care physician if you do not have one) for your aftercare needs so that they can reassess your need for medications and monitor your lab values.  Discharge Instructions    Diet - low sodium heart healthy   Complete by: As directed    Increase activity slowly   Complete by: As directed      Allergies as of 12/01/2019      Reactions   Meperidine Hives      Medication List    TAKE these medications     aspirin 325 MG tablet Take 1 tablet by mouth daily.   atorvastatin 10 MG tablet Commonly known as: Lipitor Take 1 tablet (10 mg total) by mouth daily.   levETIRAcetam 500 MG tablet Commonly known as: KEPPRA Take 1 tablet (500 mg total) by mouth 2 (two) times daily.   RABEprazole 20 MG tablet Commonly known as: ACIPHEX Take 1 tablet by mouth daily. 1 hour before meals   tamsulosin 0.4 MG Caps capsule Commonly known as: FLOMAX Take 0.4 mg by mouth daily after supper.      Allergies  Allergen Reactions  . Meperidine Hives   Follow-up Information    Kate Sable, MD. Schedule an appointment as soon as possible for a visit in 1 month(s).   Specialties: Cardiology, Radiology Why: make appointment for 30 heart monitor Contact information: [place] [address] [phone]            The results of significant diagnostics from this hospitalization (including  imaging, microbiology, ancillary and laboratory) are listed below for reference.    Significant Diagnostic Studies: CT ANGIO HEAD W OR WO CONTRAST  Result Date: 11/30/2019 CLINICAL DATA:  Transient episode of altered mental status during the middle the night. Patient is returned to baseline but is amnestic of the event. EXAM: CT ANGIOGRAPHY HEAD AND NECK TECHNIQUE: Multidetector CT imaging of the head and neck was performed using the standard protocol during bolus administration of intravenous contrast. Multiplanar CT image reconstructions and MIPs were obtained to evaluate the vascular anatomy. Carotid stenosis measurements (when applicable) are obtained utilizing NASCET criteria, using the distal internal carotid diameter as the denominator. CONTRAST:  32mL OMNIPAQUE IOHEXOL 350 MG/ML SOLN COMPARISON:  CT head without contrast 11/30/2019. MR head without contrast 11/30/2019 FINDINGS: CT HEAD FINDINGS Brain: Remote cortical infarcts of the posterior right frontal lobe and anterior right parietal  lobe are again noted. Atrophy and white matter disease are stable. No acute intracranial abnormality or significant interval changes present. Vascular: Vascular calcifications are again seen. Skull: Calvarium is intact. No focal lytic or blastic lesions are present. No significant extracranial soft tissue lesion is present. Sinuses: Right middle ear and mastoid effusion is present. The paranasal sinuses and mastoid air cells are otherwise clear. Orbits: The globes and orbits are within normal limits. Review of the MIP images confirms the above findings CTA NECK FINDINGS Aortic arch: A 3 vessel arch configuration is present. Atherosclerotic changes are present in the aorta. There is no aneurysm. Extensive mural plaque is present in the proximal left subclavian artery without a significant stenosis. Right carotid system: The right common carotid artery is within normal limits. Dense atherosclerotic calcifications are present at the right carotid bifurcation. Lumen is narrowed 1.2 mm. The more distal vessel measures 4 mm. There is slight irregularity within the cervical right ICA without a significant stenosis. Left carotid system: The left common carotid artery is within normal limits. Atherosclerotic calcifications are present at the carotid bifurcation. Mild irregularity is present within the more distal cervical left ICA without a significant stenosis. Vertebral arteries: The left vertebral artery is slightly dominant to the right. Both vertebral arteries originate from the subclavian arteries without significant stenosis. There is no significant stenosis of either vertebral artery in the neck. Skeleton: Multilevel degenerative changes are noted. Degenerative anterolisthesis present C3-4 and C4-5. Chronic endplate changes and uncovertebral spurring is worse at C5-6 than C6-7. No focal lytic or blastic lesions are present. Vertebral body heights are maintained. Other neck: No focal mucosal or submucosal lesions are  present. Salivary glands are within normal limits. No significant adenopathy is present. The thyroid is within normal limits. Upper chest: Mild dependent atelectasis is present. Upper lung fields are clear. Thoracic inlet is within normal limits. Review of the MIP images confirms the above findings CTA HEAD FINDINGS Anterior circulation: Atherosclerotic changes are present within the cavernous internal carotid arteries bilaterally. There is no significant stenosis through the ICA termini. The A1 and M1 segments are normal. The anterior communicating artery is patent. MCA bifurcations are within normal limits. The ACA and MCA branch vessels are unremarkable. Posterior circulation: Atherosclerotic changes are present at the dural margin. Moderate left and mild right stenosis is present. The more distal V4 segments are within normal limits. Vertebrobasilar junction is normal. The PICA origins are visualized and normal. Both posterior cerebral arteries originate from the basilar tip. PCA branch vessels are within normal limits. Venous sinuses: Dural sinuses are patent. The right transverse sinus is dominant. The straight  sinus and deep cerebral veins are intact. Cortical veins are unremarkable. Anatomic variants: None Review of the MIP images confirms the above findings IMPRESSION: 1. No emergent large vessel occlusion 2. Atherosclerotic changes at the carotid bifurcations bilaterally with approximately 70% stenosis of the right internal carotid artery at the bifurcation. 3. Beaded irregularity of the cervical internal carotid arteries bilaterally raises concern for FMD. 4. Atherosclerotic changes within the cavernous internal carotid arteries bilaterally without significant stenosis. 5. Moderate left and mild right V4 segment stenosis. 6. Multilevel spondylosis of the cervical spine. 7. Remote cortical infarcts of the right frontal and parietal lobes. 8. Right middle ear and mastoid effusion. Electronically Signed    By: San Morelle M.D.   On: 11/30/2019 13:17   CT ANGIO NECK W OR WO CONTRAST  Result Date: 11/30/2019 CLINICAL DATA:  Transient episode of altered mental status during the middle the night. Patient is returned to baseline but is amnestic of the event. EXAM: CT ANGIOGRAPHY HEAD AND NECK TECHNIQUE: Multidetector CT imaging of the head and neck was performed using the standard protocol during bolus administration of intravenous contrast. Multiplanar CT image reconstructions and MIPs were obtained to evaluate the vascular anatomy. Carotid stenosis measurements (when applicable) are obtained utilizing NASCET criteria, using the distal internal carotid diameter as the denominator. CONTRAST:  23mL OMNIPAQUE IOHEXOL 350 MG/ML SOLN COMPARISON:  CT head without contrast 11/30/2019. MR head without contrast 11/30/2019 FINDINGS: CT HEAD FINDINGS Brain: Remote cortical infarcts of the posterior right frontal lobe and anterior right parietal lobe are again noted. Atrophy and white matter disease are stable. No acute intracranial abnormality or significant interval changes present. Vascular: Vascular calcifications are again seen. Skull: Calvarium is intact. No focal lytic or blastic lesions are present. No significant extracranial soft tissue lesion is present. Sinuses: Right middle ear and mastoid effusion is present. The paranasal sinuses and mastoid air cells are otherwise clear. Orbits: The globes and orbits are within normal limits. Review of the MIP images confirms the above findings CTA NECK FINDINGS Aortic arch: A 3 vessel arch configuration is present. Atherosclerotic changes are present in the aorta. There is no aneurysm. Extensive mural plaque is present in the proximal left subclavian artery without a significant stenosis. Right carotid system: The right common carotid artery is within normal limits. Dense atherosclerotic calcifications are present at the right carotid bifurcation. Lumen is narrowed 1.2  mm. The more distal vessel measures 4 mm. There is slight irregularity within the cervical right ICA without a significant stenosis. Left carotid system: The left common carotid artery is within normal limits. Atherosclerotic calcifications are present at the carotid bifurcation. Mild irregularity is present within the more distal cervical left ICA without a significant stenosis. Vertebral arteries: The left vertebral artery is slightly dominant to the right. Both vertebral arteries originate from the subclavian arteries without significant stenosis. There is no significant stenosis of either vertebral artery in the neck. Skeleton: Multilevel degenerative changes are noted. Degenerative anterolisthesis present C3-4 and C4-5. Chronic endplate changes and uncovertebral spurring is worse at C5-6 than C6-7. No focal lytic or blastic lesions are present. Vertebral body heights are maintained. Other neck: No focal mucosal or submucosal lesions are present. Salivary glands are within normal limits. No significant adenopathy is present. The thyroid is within normal limits. Upper chest: Mild dependent atelectasis is present. Upper lung fields are clear. Thoracic inlet is within normal limits. Review of the MIP images confirms the above findings CTA HEAD FINDINGS Anterior circulation: Atherosclerotic changes are  present within the cavernous internal carotid arteries bilaterally. There is no significant stenosis through the ICA termini. The A1 and M1 segments are normal. The anterior communicating artery is patent. MCA bifurcations are within normal limits. The ACA and MCA branch vessels are unremarkable. Posterior circulation: Atherosclerotic changes are present at the dural margin. Moderate left and mild right stenosis is present. The more distal V4 segments are within normal limits. Vertebrobasilar junction is normal. The PICA origins are visualized and normal. Both posterior cerebral arteries originate from the basilar  tip. PCA branch vessels are within normal limits. Venous sinuses: Dural sinuses are patent. The right transverse sinus is dominant. The straight sinus and deep cerebral veins are intact. Cortical veins are unremarkable. Anatomic variants: None Review of the MIP images confirms the above findings IMPRESSION: 1. No emergent large vessel occlusion 2. Atherosclerotic changes at the carotid bifurcations bilaterally with approximately 70% stenosis of the right internal carotid artery at the bifurcation. 3. Beaded irregularity of the cervical internal carotid arteries bilaterally raises concern for FMD. 4. Atherosclerotic changes within the cavernous internal carotid arteries bilaterally without significant stenosis. 5. Moderate left and mild right V4 segment stenosis. 6. Multilevel spondylosis of the cervical spine. 7. Remote cortical infarcts of the right frontal and parietal lobes. 8. Right middle ear and mastoid effusion. Electronically Signed   By: San Morelle M.D.   On: 11/30/2019 13:17   MR BRAIN WO CONTRAST  Result Date: 11/30/2019 CLINICAL DATA:  Seizure, nontraumatic. Age over 18. Altered mental status. EXAM: MRI HEAD WITHOUT CONTRAST TECHNIQUE: Multiplanar, multiecho pulse sequences of the brain and surrounding structures were obtained without intravenous contrast. COMPARISON:  CT head without contrast 11/30/2019 FINDINGS: Brain: No acute infarct, hemorrhage, or mass lesion is present. Moderate atrophy and diffuse white matter disease is present bilaterally. Remote cortical and subcortical infarcts are present in the posterior right frontal lobe and anterior right parietal lobe near the region of the hand portion of the homunculus. Confluent periventricular and scattered subcortical T2 hyperintensities are present elsewhere without cortical involvement. A remote lacunar infarct is present in the left cerebellum. Remote lacunar infarcts are present in the thalami bilaterally. Dilated perivascular  spaces are present through the basal ganglia. The ventricles are proportionate to the degree of atrophy. No significant extraaxial fluid collection is present. Temporal lobes are unremarkable. No focal mass lesion present. No significant signal changes are present. Vascular: Flow is present in the major intracranial arteries. Skull and upper cervical spine: The craniocervical junction is normal. Upper cervical spine is within normal limits. Marrow signal is unremarkable. Sinuses/Orbits: A right middle ear and mastoid effusion is present. No obstructing nasopharyngeal lesion is present. Left mastoid air cells are clear. Minimal mucosal thickening is present at floor of the right maxillary sinus. The right frontal sinus is not pneumatized. The paranasal sinuses and mastoid air cells are otherwise clear. The globes and orbits are within normal limits. IMPRESSION: 1. No acute or focal abnormality to explain the patient's symptoms. 2. Moderate atrophy and diffuse white matter disease likely reflects the sequela of chronic microvascular ischemia. 3. Remote cortical and subcortical infarcts involving the posterior right frontal lobe and anterior right parietal lobe. 4. Remote lacunar infarcts of the thalami bilaterally and the left cerebellum. 5. Right middle ear and mastoid effusion. No obstructing nasopharyngeal lesion is present. Infection is not excluded. Electronically Signed   By: San Morelle M.D.   On: 11/30/2019 06:57   EEG adult  Result Date: 12/01/2019 Alexis Goodell, MD  12/01/2019  1:12 PM ELECTROENCEPHALOGRAM REPORT Patient: MICKEL SCHREUR       Room #: UD14H EEG No. ID: [id number] Age: 86 y.o.        Sex: male Referring Physician: [place] Pink Report Date:  12/01/2019       Interpreting Physician: Alexis Goodell History: KAMAAL CAST is an 86 y.o. male presenting with altered mental status Medications: Keppra, ASA, Protonix, Flomax Conditions of Recording:  This is a 21 channel routine scalp EEG  performed with bipolar and monopolar montages arranged in accordance to the international 10/20 system of electrode placement. One channel was dedicated to EKG recording. The patient is in the awake and drowsy states. Description:  The waking background activity consists of a low voltage, symmetrical, fairly well organized, 8-9 Hz alpha activity, seen from the parieto-occipital and posterior temporal regions.  Low voltage fast activity, poorly organized, is seen anteriorly and is at times superimposed on more posterior regions.  A mixture of theta and alpha rhythms are seen from the central and temporal regions. The patient drowses with slowing to irregular, low voltage theta and beta activity.  Stage II sleep is not obtained. No epileptiform activity is noted.  Hyperventilation was not performed.  Intermittent photic stimulation was performed but failed to illicit any change in the tracing. IMPRESSION: Normal electroencephalogram, awake, drowsy and with activation procedures. Alexis Goodell, MD Neurology [phone] 12/01/2019, 1:10 PM   ECHOCARDIOGRAM COMPLETE  Result Date: 12/01/2019   ECHOCARDIOGRAM REPORT   Patient Name:   ELDEAN NANNA Date of Exam: 12/01/2019 Medical Rec #:  [id number]     Height:       70.0 in Accession #:    [id number]    Weight:       180.0 lb Date of Birth:  Dec 23, 1931      BSA:          2.00 m Patient Age:    58 years      BP:           164/84 mmHg Patient Gender: M             HR:           66 bpm. Exam Location:  [facility] Procedure: 2D Echo, Color Doppler, Cardiac Doppler and Intracardiac            Opacification Agent Indications:     I163.9 Stroke  History:         Patient has no prior history of Echocardiogram examinations.                  CVA; Risk Factors:Hypertension.  Sonographer:     Charmayne Sheer RDCS (AE) Referring Phys:  Baker Janus Soledad Gerlach NIU Diagnosing Phys: Bartholome Bill MD  Sonographer Comments: Suboptimal apical window and suboptimal parasternal window. IMPRESSIONS  1. Left  ventricular ejection fraction, by visual estimation, is >75%. The left ventricle has hyperdynamic function. Left ventricular septal wall thickness was moderately increased. Moderately increased left ventricular posterior wall thickness. There is  moderately increased left ventricular hypertrophy.  2. Definity contrast agent was given IV to delineate the left ventricular endocardial borders.  3. Left ventricular diastolic parameters are consistent with Grade I diastolic dysfunction (impaired relaxation).  4. The left ventricle has no regional wall motion abnormalities.  5. Global right ventricle has normal systolic function.The right ventricular size is normal. No increase in right ventricular wall thickness.  6. Left atrial size was normal.  7. Right atrial size was normal.  8. The  mitral valve was not well visualized. Trivial mitral valve regurgitation.  9. The tricuspid valve is not well visualized. 10. The aortic valve was not well visualized. Aortic valve regurgitation is not visualized. 11. The pulmonic valve was not well visualized. Pulmonic valve regurgitation is trivial. 12. The aortic root was not well visualized. 13. Mildly elevated pulmonary artery systolic pressure. 14. The interatrial septum was not assessed. FINDINGS  Left Ventricle: Left ventricular ejection fraction, by visual estimation, is >75%. The left ventricle has hyperdynamic function. Definity contrast agent was given IV to delineate the left ventricular endocardial borders. The left ventricle has no regional wall motion abnormalities. Moderately increased left ventricular posterior wall thickness. There is moderately increased left ventricular hypertrophy. Left ventricular diastolic parameters are consistent with Grade I diastolic dysfunction (impaired relaxation). Right Ventricle: The right ventricular size is normal. No increase in right ventricular wall thickness. Global RV systolic function is has normal systolic function. The tricuspid  regurgitant velocity is 2.43 m/s, and with an assumed right atrial pressure  of 10 mmHg, the estimated right ventricular systolic pressure is mildly elevated at 33.6 mmHg. Left Atrium: Left atrial size was normal in size. Right Atrium: Right atrial size was normal in size Pericardium: There is no evidence of pericardial effusion. Mitral Valve: The mitral valve was not well visualized. Trivial mitral valve regurgitation. MV peak gradient, 5.1 mmHg. Tricuspid Valve: The tricuspid valve is not well visualized. Tricuspid valve regurgitation is mild. Aortic Valve: The aortic valve was not well visualized. Aortic valve regurgitation is not visualized. Aortic valve mean gradient measures 5.0 mmHg. Aortic valve peak gradient measures 9.5 mmHg. Aortic valve area, by VTI measures 1.74 cm. Pulmonic Valve: The pulmonic valve was not well visualized. Pulmonic valve regurgitation is trivial. Pulmonic regurgitation is trivial. Aorta: The aortic root was not well visualized. IAS/Shunts: The interatrial septum was not assessed.  LEFT VENTRICLE PLAX 2D LVIDd:         3.32 cm  Diastology LVIDs:         1.82 cm  LV e' lateral:   6.74 cm/s LV PW:         0.85 cm  LV E/e' lateral: 12.4 LV IVS:        0.69 cm  LV e' medial:    5.55 cm/s LVOT diam:     2.00 cm  LV E/e' medial:  15.1 LV SV:         35 ml LV SV Index:   17.19 LVOT Area:     3.14 cm  RIGHT VENTRICLE RV Basal diam:  2.48 cm LEFT ATRIUM             Index       RIGHT ATRIUM          Index LA diam:        2.70 cm 1.35 cm/m  RA Area:     8.51 cm LA Vol (A2C):   27.6 ml 13.83 ml/m RA Volume:   14.00 ml 7.01 ml/m LA Vol (A4C):   22.7 ml 11.37 ml/m LA Biplane Vol: 26.7 ml 13.38 ml/m  AORTIC VALVE                    PULMONIC VALVE AV Area (Vmax):    1.75 cm     PV Vmax:       0.94 m/s AV Area (Vmean):   1.76 cm     PV Vmean:      62.900 cm/s AV Area (VTI):  1.74 cm     PV VTI:        0.172 m AV Vmax:           154.00 cm/s  PV Peak grad:  3.5 mmHg AV Vmean:           107.000 cm/s PV Mean grad:  2.0 mmHg AV VTI:            0.296 m AV Peak Grad:      9.5 mmHg AV Mean Grad:      5.0 mmHg LVOT Vmax:         85.70 cm/s LVOT Vmean:        60.000 cm/s LVOT VTI:          0.164 m LVOT/AV VTI ratio: 0.55  AORTA Ao Root diam: 3.30 cm MITRAL VALVE                         TRICUSPID VALVE MV Area (PHT): 2.34 cm              TR Peak grad:   23.6 mmHg MV Peak grad:  5.1 mmHg              TR Vmax:        243.00 cm/s MV Mean grad:  1.0 mmHg MV Vmax:       1.13 m/s              SHUNTS MV Vmean:      53.8 cm/s             Systemic VTI:  0.16 m MV VTI:        0.31 m                Systemic Diam: 2.00 cm MV PHT:        93.96 msec MV Decel Time: 324 msec MV E velocity: 83.90 cm/s  103 cm/s MV A velocity: 110.00 cm/s 70.3 cm/s MV E/A ratio:  0.76        1.5  Bartholome Bill MD Electronically signed by Bartholome Bill MD Signature Date/Time: 12/01/2019/12:52:03 PM    Final    CT HEAD CODE STROKE WO CONTRAST  Result Date: 11/30/2019 CLINICAL DATA:  Code stroke. Initial evaluation for acute unresponsiveness. EXAM: CT HEAD WITHOUT CONTRAST TECHNIQUE: Contiguous axial images were obtained from the base of the skull through the vertex without intravenous contrast. COMPARISON:  None. FINDINGS: Brain: Generalized age-related cerebral atrophy with chronic small vessel ischemic disease. No acute intracranial hemorrhage. No acute large vessel territory infarct. No mass lesion, midline shift or mass effect. No hydrocephalus. No extra-axial fluid collection. Vascular: No hyperdense vessel. Calcified atherosclerosis present at skull base. Skull: Scalp soft tissues and calvarium within normal limits. Sinuses/Orbits: Globes and orbital soft tissues normal. Paranasal sinuses are clear. Chronic opacification of the right mastoid air cells and middle ear cavity. Visualized nasopharynx grossly unremarkable. Other: None. ASPECTS [facility] Stroke Program Early CT Score) - Ganglionic level infarction (caudate, lentiform nuclei,  internal capsule, insula, M1-M3 cortex): 7 - Supraganglionic infarction (M4-M6 cortex): 3 Total score (0-10 with 10 being normal): 10 IMPRESSION: 1. No acute intracranial infarct or other abnormality. 2. ASPECTS is 10. 3. Age-related cerebral atrophy with chronic small vessel ischemic disease. 4. Chronic right mastoid effusion. Critical Value/emergent results were called by telephone at the time of interpretation on 11/30/2019 at 5:15 am to [place] , who verbally acknowledged these results. Electronically Signed   By: Marland Kitchen  Jeannine Boga M.D.   On: 11/30/2019 05:17    Microbiology: Recent Results (from the past 240 hour(s))  SARS CORONAVIRUS 2 (TAT 6-24 HRS) Nasopharyngeal Nasopharyngeal Swab     Status: None   Collection Time: 11/30/19  6:51 AM   Specimen: Nasopharyngeal Swab  Result Value Ref Range Status   SARS Coronavirus 2 NEGATIVE NEGATIVE Final    Comment: (NOTE) SARS-CoV-2 target nucleic acids are NOT DETECTED. The SARS-CoV-2 RNA is generally detectable in upper and lower respiratory specimens during the acute phase of infection. Negative results do not preclude SARS-CoV-2 infection, do not rule out co-infections with other pathogens, and should not be used as the sole basis for treatment or other patient management decisions. Negative results must be combined with clinical observations, patient history, and epidemiological information. The expected result is Negative. Fact Sheet for Patients: SugarRoll[employer]be Fact Sheet for Healthcare Providers: https://www.woods-mathews.com/ This test is not yet approved or cleared by the [place] FDA and  has been authorized for detection and/or diagnosis of SARS-CoV-2 by FDA under an Emergency Use Authorization (EUA). This EUA will remain  in effect (meaning this test can be used) for the duration of the COVID-19 declaration under Section 56 4(b)(1) of the Act, 21 U.S.C. section  360bbb-3(b)(1), unless the authorization is terminated or revoked sooner. Performed at [facility], [place] [address], [place], [place] [address]      Labs: Basic Metabolic Panel: Recent Labs  Lab 11/30/19 0449 12/01/19 0531  NA 140 139  K 3.6 3.6  CL 104 104  CO2 25 26  GLUCOSE 126* 79  BUN 23 19  CREATININE 1.29* 1.00  CALCIUM 8.4* 8.5*   Liver Function Tests: Recent Labs  Lab 11/30/19 0449  AST 23  ALT 13  ALKPHOS 45  BILITOT 0.7  PROT 6.7  ALBUMIN 3.8   No results for input(s): LIPASE, AMYLASE in the last 168 hours. No results for input(s): AMMONIA in the last 168 hours. CBC: Recent Labs  Lab 11/30/19 0449 12/01/19 0531  WBC 12.3* 10.4  NEUTROABS 9.3*  --   HGB 12.3* 12.5*  HCT 37.4* 37.8*  MCV 89.9 89.6  PLT 143* 151   Cardiac Enzymes: No results for input(s): CKTOTAL, CKMB, CKMBINDEX, TROPONINI in the last 168 hours. BNP: BNP (last 3 results) No results for input(s): BNP in the last 8760 hours.  ProBNP (last 3 results) No results for input(s): PROBNP in the last 8760 hours.  CBG: Recent Labs  Lab 12/01/19 0744 12/01/19 0824 12/01/19 1258  GLUCAP 57* 85 88       Signed:  Annita Brod, MD [facility] 12/01/2019, 4:48 PM

## 2019-12-01 NOTE — Progress Notes (Signed)
Subjective: Patient stable this morning.  No further episodes of altered awareness.    Objective: Current vital signs: BP (!) 164/84   Pulse (!) 50   Temp 98 F (36.7 C) (Oral)   Resp 13   Ht 5\' 10"  (1.778 m)   Wt 81.6 kg   SpO2 100%   BMI 25.83 kg/m  Vital signs in last 24 hours: Temp:  [97.7 F (36.5 C)-98 F (36.7 C)] 98 F (36.7 C) (01/18 0741) Pulse Rate:  [36-61] 50 (01/18 0700) Resp:  [7-18] 13 (01/18 0700) BP: (123-189)/(61-97) 164/84 (01/18 0741) SpO2:  [94 %-100 %] 100 % (01/18 0741)  Intake/Output from previous day: 01/17 0701 - 01/18 0700 In: 200 [IV Piggyback:200] Out: 300 [Urine:300] Intake/Output this shift: No intake/output data recorded. Nutritional status:  Diet Order            Diet NPO time specified Except for: Sips with Meds, Ice Chips  Diet effective now              Neurologic Exam: Mental Status: Alert and awake.  Speech fluent without evidence of aphasia.  Requires reinforcement to follow 3 step commands. Cranial Nerves: II: Visual fields grossly normal, pupils equal, round, reactive to light and accommodation III,IV, VI: ptosis not present, extra-ocular motions intact bilaterally V,VII: smile symmetric, facial light touch sensation normal bilaterally VIII: hearing normal bilaterally IX,X: gag reflex present XI: bilateral shoulder shrug XII: midline tongue extension Motor: 5/5 throuhgout Sensory: Pinprick and light touch intact throughout, bilaterally  Lab Results: Basic Metabolic Panel: Recent Labs  Lab 11/30/19 0449 12/01/19 0531  NA 140 139  K 3.6 3.6  CL 104 104  CO2 25 26  GLUCOSE 126* 79  BUN 23 19  CREATININE 1.29* 1.00  CALCIUM 8.4* 8.5*    Liver Function Tests: Recent Labs  Lab 11/30/19 0449  AST 23  ALT 13  ALKPHOS 45  BILITOT 0.7  PROT 6.7  ALBUMIN 3.8   No results for input(s): LIPASE, AMYLASE in the last 168 hours. No results for input(s): AMMONIA in the last 168 hours.  CBC: Recent Labs   Lab 11/30/19 0449 12/01/19 0531  WBC 12.3* 10.4  NEUTROABS 9.3*  --   HGB 12.3* 12.5*  HCT 37.4* 37.8*  MCV 89.9 89.6  PLT 143* 151    Cardiac Enzymes: No results for input(s): CKTOTAL, CKMB, CKMBINDEX, TROPONINI in the last 168 hours.  Lipid Panel: Recent Labs  Lab 12/01/19 0531  CHOL 145  TRIG 87  HDL 58  CHOLHDL 2.5  VLDL 17  LDLCALC 70    CBG: Recent Labs  Lab 12/01/19 0744 12/01/19 0824  GLUCAP 57* 85    Microbiology: Results for orders placed or performed during the hospital encounter of 11/30/19  SARS CORONAVIRUS 2 (TAT 6-24 HRS) Nasopharyngeal Nasopharyngeal Swab     Status: None   Collection Time: 11/30/19  6:51 AM   Specimen: Nasopharyngeal Swab  Result Value Ref Range Status   SARS Coronavirus 2 NEGATIVE NEGATIVE Final    Comment: (NOTE) SARS-CoV-2 target nucleic acids are NOT DETECTED. The SARS-CoV-2 RNA is generally detectable in upper and lower respiratory specimens during the acute phase of infection. Negative results do not preclude SARS-CoV-2 infection, do not rule out co-infections with other pathogens, and should not be used as the sole basis for treatment or other patient management decisions. Negative results must be combined with clinical observations, patient history, and epidemiological information. The expected result is Negative. Fact Sheet for Patients: SugarRoll[employer]be Fact  Sheet for Healthcare Providers: https://www.woods-mathews.com/ This test is not yet approved or cleared by the [place] FDA and  has been authorized for detection and/or diagnosis of SARS-CoV-2 by FDA under an Emergency Use Authorization (EUA). This EUA will remain  in effect (meaning this test can be used) for the duration of the COVID-19 declaration under Section 56 4(b)(1) of the Act, 21 U.S.C. section 360bbb-3(b)(1), unless the authorization is terminated or revoked sooner. Performed at [facility],  [place] [address], [place], [place] [address]     Coagulation Studies: Recent Labs    11/30/19 0651  LABPROT 12.4  INR 0.9    Imaging: CT ANGIO HEAD W OR WO CONTRAST  Result Date: 11/30/2019 CLINICAL DATA:  Transient episode of altered mental status during the middle the night. Patient is returned to baseline but is amnestic of the event. EXAM: CT ANGIOGRAPHY HEAD AND NECK TECHNIQUE: Multidetector CT imaging of the head and neck was performed using the standard protocol during bolus administration of intravenous contrast. Multiplanar CT image reconstructions and MIPs were obtained to evaluate the vascular anatomy. Carotid stenosis measurements (when applicable) are obtained utilizing NASCET criteria, using the distal internal carotid diameter as the denominator. CONTRAST:  25mL OMNIPAQUE IOHEXOL 350 MG/ML SOLN COMPARISON:  CT head without contrast 11/30/2019. MR head without contrast 11/30/2019 FINDINGS: CT HEAD FINDINGS Brain: Remote cortical infarcts of the posterior right frontal lobe and anterior right parietal lobe are again noted. Atrophy and white matter disease are stable. No acute intracranial abnormality or significant interval changes present. Vascular: Vascular calcifications are again seen. Skull: Calvarium is intact. No focal lytic or blastic lesions are present. No significant extracranial soft tissue lesion is present. Sinuses: Right middle ear and mastoid effusion is present. The paranasal sinuses and mastoid air cells are otherwise clear. Orbits: The globes and orbits are within normal limits. Review of the MIP images confirms the above findings CTA NECK FINDINGS Aortic arch: A 3 vessel arch configuration is present. Atherosclerotic changes are present in the aorta. There is no aneurysm. Extensive mural plaque is present in the proximal left subclavian artery without a significant stenosis. Right carotid system: The right common carotid artery is within normal limits. Dense atherosclerotic  calcifications are present at the right carotid bifurcation. Lumen is narrowed 1.2 mm. The more distal vessel measures 4 mm. There is slight irregularity within the cervical right ICA without a significant stenosis. Left carotid system: The left common carotid artery is within normal limits. Atherosclerotic calcifications are present at the carotid bifurcation. Mild irregularity is present within the more distal cervical left ICA without a significant stenosis. Vertebral arteries: The left vertebral artery is slightly dominant to the right. Both vertebral arteries originate from the subclavian arteries without significant stenosis. There is no significant stenosis of either vertebral artery in the neck. Skeleton: Multilevel degenerative changes are noted. Degenerative anterolisthesis present C3-4 and C4-5. Chronic endplate changes and uncovertebral spurring is worse at C5-6 than C6-7. No focal lytic or blastic lesions are present. Vertebral body heights are maintained. Other neck: No focal mucosal or submucosal lesions are present. Salivary glands are within normal limits. No significant adenopathy is present. The thyroid is within normal limits. Upper chest: Mild dependent atelectasis is present. Upper lung fields are clear. Thoracic inlet is within normal limits. Review of the MIP images confirms the above findings CTA HEAD FINDINGS Anterior circulation: Atherosclerotic changes are present within the cavernous internal carotid arteries bilaterally. There is no significant stenosis through the ICA  termini. The A1 and M1 segments are normal. The anterior communicating artery is patent. MCA bifurcations are within normal limits. The ACA and MCA branch vessels are unremarkable. Posterior circulation: Atherosclerotic changes are present at the dural margin. Moderate left and mild right stenosis is present. The more distal V4 segments are within normal limits. Vertebrobasilar junction is normal. The PICA origins are  visualized and normal. Both posterior cerebral arteries originate from the basilar tip. PCA branch vessels are within normal limits. Venous sinuses: Dural sinuses are patent. The right transverse sinus is dominant. The straight sinus and deep cerebral veins are intact. Cortical veins are unremarkable. Anatomic variants: None Review of the MIP images confirms the above findings IMPRESSION: 1. No emergent large vessel occlusion 2. Atherosclerotic changes at the carotid bifurcations bilaterally with approximately 70% stenosis of the right internal carotid artery at the bifurcation. 3. Beaded irregularity of the cervical internal carotid arteries bilaterally raises concern for FMD. 4. Atherosclerotic changes within the cavernous internal carotid arteries bilaterally without significant stenosis. 5. Moderate left and mild right V4 segment stenosis. 6. Multilevel spondylosis of the cervical spine. 7. Remote cortical infarcts of the right frontal and parietal lobes. 8. Right middle ear and mastoid effusion. Electronically Signed   By: San Morelle M.D.   On: 11/30/2019 13:17   CT ANGIO NECK W OR WO CONTRAST  Result Date: 11/30/2019 CLINICAL DATA:  Transient episode of altered mental status during the middle the night. Patient is returned to baseline but is amnestic of the event. EXAM: CT ANGIOGRAPHY HEAD AND NECK TECHNIQUE: Multidetector CT imaging of the head and neck was performed using the standard protocol during bolus administration of intravenous contrast. Multiplanar CT image reconstructions and MIPs were obtained to evaluate the vascular anatomy. Carotid stenosis measurements (when applicable) are obtained utilizing NASCET criteria, using the distal internal carotid diameter as the denominator. CONTRAST:  32mL OMNIPAQUE IOHEXOL 350 MG/ML SOLN COMPARISON:  CT head without contrast 11/30/2019. MR head without contrast 11/30/2019 FINDINGS: CT HEAD FINDINGS Brain: Remote cortical infarcts of the posterior  right frontal lobe and anterior right parietal lobe are again noted. Atrophy and white matter disease are stable. No acute intracranial abnormality or significant interval changes present. Vascular: Vascular calcifications are again seen. Skull: Calvarium is intact. No focal lytic or blastic lesions are present. No significant extracranial soft tissue lesion is present. Sinuses: Right middle ear and mastoid effusion is present. The paranasal sinuses and mastoid air cells are otherwise clear. Orbits: The globes and orbits are within normal limits. Review of the MIP images confirms the above findings CTA NECK FINDINGS Aortic arch: A 3 vessel arch configuration is present. Atherosclerotic changes are present in the aorta. There is no aneurysm. Extensive mural plaque is present in the proximal left subclavian artery without a significant stenosis. Right carotid system: The right common carotid artery is within normal limits. Dense atherosclerotic calcifications are present at the right carotid bifurcation. Lumen is narrowed 1.2 mm. The more distal vessel measures 4 mm. There is slight irregularity within the cervical right ICA without a significant stenosis. Left carotid system: The left common carotid artery is within normal limits. Atherosclerotic calcifications are present at the carotid bifurcation. Mild irregularity is present within the more distal cervical left ICA without a significant stenosis. Vertebral arteries: The left vertebral artery is slightly dominant to the right. Both vertebral arteries originate from the subclavian arteries without significant stenosis. There is no significant stenosis of either vertebral artery in the neck. Skeleton: Multilevel degenerative  changes are noted. Degenerative anterolisthesis present C3-4 and C4-5. Chronic endplate changes and uncovertebral spurring is worse at C5-6 than C6-7. No focal lytic or blastic lesions are present. Vertebral body heights are maintained. Other  neck: No focal mucosal or submucosal lesions are present. Salivary glands are within normal limits. No significant adenopathy is present. The thyroid is within normal limits. Upper chest: Mild dependent atelectasis is present. Upper lung fields are clear. Thoracic inlet is within normal limits. Review of the MIP images confirms the above findings CTA HEAD FINDINGS Anterior circulation: Atherosclerotic changes are present within the cavernous internal carotid arteries bilaterally. There is no significant stenosis through the ICA termini. The A1 and M1 segments are normal. The anterior communicating artery is patent. MCA bifurcations are within normal limits. The ACA and MCA branch vessels are unremarkable. Posterior circulation: Atherosclerotic changes are present at the dural margin. Moderate left and mild right stenosis is present. The more distal V4 segments are within normal limits. Vertebrobasilar junction is normal. The PICA origins are visualized and normal. Both posterior cerebral arteries originate from the basilar tip. PCA branch vessels are within normal limits. Venous sinuses: Dural sinuses are patent. The right transverse sinus is dominant. The straight sinus and deep cerebral veins are intact. Cortical veins are unremarkable. Anatomic variants: None Review of the MIP images confirms the above findings IMPRESSION: 1. No emergent large vessel occlusion 2. Atherosclerotic changes at the carotid bifurcations bilaterally with approximately 70% stenosis of the right internal carotid artery at the bifurcation. 3. Beaded irregularity of the cervical internal carotid arteries bilaterally raises concern for FMD. 4. Atherosclerotic changes within the cavernous internal carotid arteries bilaterally without significant stenosis. 5. Moderate left and mild right V4 segment stenosis. 6. Multilevel spondylosis of the cervical spine. 7. Remote cortical infarcts of the right frontal and parietal lobes. 8. Right middle ear  and mastoid effusion. Electronically Signed   By: San Morelle M.D.   On: 11/30/2019 13:17   MR BRAIN WO CONTRAST  Result Date: 11/30/2019 CLINICAL DATA:  Seizure, nontraumatic. Age over 45. Altered mental status. EXAM: MRI HEAD WITHOUT CONTRAST TECHNIQUE: Multiplanar, multiecho pulse sequences of the brain and surrounding structures were obtained without intravenous contrast. COMPARISON:  CT head without contrast 11/30/2019 FINDINGS: Brain: No acute infarct, hemorrhage, or mass lesion is present. Moderate atrophy and diffuse white matter disease is present bilaterally. Remote cortical and subcortical infarcts are present in the posterior right frontal lobe and anterior right parietal lobe near the region of the hand portion of the homunculus. Confluent periventricular and scattered subcortical T2 hyperintensities are present elsewhere without cortical involvement. A remote lacunar infarct is present in the left cerebellum. Remote lacunar infarcts are present in the thalami bilaterally. Dilated perivascular spaces are present through the basal ganglia. The ventricles are proportionate to the degree of atrophy. No significant extraaxial fluid collection is present. Temporal lobes are unremarkable. No focal mass lesion present. No significant signal changes are present. Vascular: Flow is present in the major intracranial arteries. Skull and upper cervical spine: The craniocervical junction is normal. Upper cervical spine is within normal limits. Marrow signal is unremarkable. Sinuses/Orbits: A right middle ear and mastoid effusion is present. No obstructing nasopharyngeal lesion is present. Left mastoid air cells are clear. Minimal mucosal thickening is present at floor of the right maxillary sinus. The right frontal sinus is not pneumatized. The paranasal sinuses and mastoid air cells are otherwise clear. The globes and orbits are within normal limits. IMPRESSION: 1. No acute or  focal abnormality to  explain the patient's symptoms. 2. Moderate atrophy and diffuse white matter disease likely reflects the sequela of chronic microvascular ischemia. 3. Remote cortical and subcortical infarcts involving the posterior right frontal lobe and anterior right parietal lobe. 4. Remote lacunar infarcts of the thalami bilaterally and the left cerebellum. 5. Right middle ear and mastoid effusion. No obstructing nasopharyngeal lesion is present. Infection is not excluded. Electronically Signed   By: San Morelle M.D.   On: 11/30/2019 06:57   CT HEAD CODE STROKE WO CONTRAST  Result Date: 11/30/2019 CLINICAL DATA:  Code stroke. Initial evaluation for acute unresponsiveness. EXAM: CT HEAD WITHOUT CONTRAST TECHNIQUE: Contiguous axial images were obtained from the base of the skull through the vertex without intravenous contrast. COMPARISON:  None. FINDINGS: Brain: Generalized age-related cerebral atrophy with chronic small vessel ischemic disease. No acute intracranial hemorrhage. No acute large vessel territory infarct. No mass lesion, midline shift or mass effect. No hydrocephalus. No extra-axial fluid collection. Vascular: No hyperdense vessel. Calcified atherosclerosis present at skull base. Skull: Scalp soft tissues and calvarium within normal limits. Sinuses/Orbits: Globes and orbital soft tissues normal. Paranasal sinuses are clear. Chronic opacification of the right mastoid air cells and middle ear cavity. Visualized nasopharynx grossly unremarkable. Other: None. ASPECTS [facility] Stroke Program Early CT Score) - Ganglionic level infarction (caudate, lentiform nuclei, internal capsule, insula, M1-M3 cortex): 7 - Supraganglionic infarction (M4-M6 cortex): 3 Total score (0-10 with 10 being normal): 10 IMPRESSION: 1. No acute intracranial infarct or other abnormality. 2. ASPECTS is 10. 3. Age-related cerebral atrophy with chronic small vessel ischemic disease. 4. Chronic right mastoid effusion. Critical  Value/emergent results were called by telephone at the time of interpretation on 11/30/2019 at 5:15 am to Talmo , who verbally acknowledged these results. Electronically Signed   By: Jeannine Boga M.D.   On: 11/30/2019 05:17    Medications:  I have reviewed the patient's current medications. Scheduled: . aspirin  325 mg Oral Daily  . enoxaparin (LOVENOX) injection  40 mg Subcutaneous Q24H  . pantoprazole  40 mg Oral Daily  . tamsulosin  0.4 mg Oral QPC supper    Assessment/Plan: 86 y.o. male with past medical history of hypertension, prior CVA with no residual deficits, single seizure, idiopathic thrombocytopenia, BPH, CKD, vitamin b12 deficiency who presents to the emergency department after being found unresponsive by his wife.  Now appears back to baseline.  MRI of the brain reviewed and shows no acute changes.  Evidence of multiple remote infarcts noted.  CTA shows right ICA stenosis at 70% and bilateral irregularities that suggest the possibility of FMD.  With patient's age and no evidence noted of dissection or aneurysm continued treatment with ASA is appropriate.  A1c 5.9.  LDL 70.  Echocardiogram is pending.   Presentation remains concerning for recurrent seizure.  EEG pending.  Keppra started and patient appears to be tolerating well.    Recommendations: 1. EEG today.  Patient and wife pressing for discharge.  If unable to wait may be performed as an outpatient. 2. Echocardiogram is pending 3. Patient on telemetry that has been uneventful.  To have prolonged cardiac monitoring on an outpatient basis 4. Patient to continue ASA daily 5. Patient to be evaluated by vascular for 70% RICA stenosis.  Based on MRI appears symptomatic with patient having multiple remote right MCA territory infarcts. 6.  Statin initiation 7. Continue Keppra at 500mg  BID.  Would change to po.   8. BP control   LOS: 1  day   Alexis Goodell, MD Neurology [phone] 12/01/2019   9:33 AM

## 2019-12-01 NOTE — ED Notes (Signed)
Pt sleeping. 

## 2019-12-01 NOTE — ED Notes (Signed)
2 cups of orange juice given to increase blood sugar. Pt tolerated well, denies any complaints.

## 2019-12-01 NOTE — Progress Notes (Signed)
eeg completed ° °

## 2019-12-01 NOTE — Progress Notes (Signed)
*  PRELIMINARY RESULTS* Echocardiogram 2D Echocardiogram has been performed.  [name] 12/01/2019, 11:19 AM

## 2019-12-03 DIAGNOSIS — M1711 Unilateral primary osteoarthritis, right knee: Secondary | ICD-10-CM | POA: Diagnosis not present

## 2019-12-03 DIAGNOSIS — E782 Mixed hyperlipidemia: Secondary | ICD-10-CM | POA: Diagnosis not present

## 2019-12-03 DIAGNOSIS — G40802 Other epilepsy, not intractable, without status epilepticus: Secondary | ICD-10-CM | POA: Diagnosis not present

## 2019-12-03 DIAGNOSIS — R001 Bradycardia, unspecified: Secondary | ICD-10-CM | POA: Diagnosis not present

## 2019-12-15 ENCOUNTER — Ambulatory Visit (INDEPENDENT_AMBULATORY_CARE_PROVIDER_SITE_OTHER): Payer: PPO

## 2019-12-15 ENCOUNTER — Other Ambulatory Visit: Payer: Self-pay

## 2019-12-15 ENCOUNTER — Ambulatory Visit (INDEPENDENT_AMBULATORY_CARE_PROVIDER_SITE_OTHER): Payer: PPO | Admitting: Cardiology

## 2019-12-15 ENCOUNTER — Encounter: Payer: Self-pay | Admitting: Cardiology

## 2019-12-15 VITALS — BP 166/89 | HR 71 | Ht 70.0 in | Wt 173.0 lb

## 2019-12-15 DIAGNOSIS — I639 Cerebral infarction, unspecified: Secondary | ICD-10-CM

## 2019-12-15 DIAGNOSIS — I10 Essential (primary) hypertension: Secondary | ICD-10-CM | POA: Diagnosis not present

## 2019-12-15 NOTE — Patient Instructions (Signed)
Medication Instructions:  - Your physician recommends that you continue on your current medications as directed. Please refer to the Current Medication list given to you today.   *If you need a refill on your cardiac medications before your next appointment, please call your pharmacy*  Lab Work: - none ordered  If you have labs (blood work) drawn today and your tests are completely normal, you will receive your results only by: Marland Kitchen MyChart Message (if you have MyChart) OR . A paper copy in the mail If you have any lab test that is abnormal or we need to change your treatment, we will call you to review the results.  Testing/Procedures: - Your physician has recommended that you wear a 14 day Zio monitor (placed in office today). This monitor is a medical device that records the heart's electrical activity. Doctors most often use these monitors to diagnose arrhythmias. Arrhythmias are problems with the speed or rhythm of the heartbeat. The monitor is a small device applied to your chest. You can wear one while you do your normal daily activities. While wearing this monitor if you have any symptoms to push the button and record what you felt. Once you have worn this monitor for the period of time provider prescribed (Usually 14 days), you will return the monitor device in the postage paid box. Once it is returned they will download the data collected and provide [place] with a report which the provider will then review and we will call you with those results. Important tips:  1. Avoid showering during the first 24 hours of wearing the monitor. 2. Avoid excessive sweating to help maximize wear time. 3. Do not submerge the device, no hot tubs, and no swimming pools. 4. Keep any lotions or oils away from the patch. 5. After 24 hours you may shower with the patch on. Take brief showers with your back facing the shower head.  6. Do not remove patch once it has been placed because that will interrupt data and  decrease adhesive wear time. 7. Push the button when you have any symptoms and write down what you were feeling. 8. Once you have completed wearing your monitor, remove and place into box which has postage paid and place in your outgoing mailbox.  9. If for some reason you have misplaced your box then call our office and we can provide another box and/or mail it off for you.        Follow-Up: At [facility], you and your health needs are our priority.  As part of our continuing mission to provide you with exceptional heart care, we have created designated Provider Care Teams.  These Care Teams include your primary Cardiologist (physician) and Advanced Practice Providers (APPs -  Physician Assistants and Nurse Practitioners) who all work together to provide you with the care you need, when you need it.  Your next appointment:   1 month(s)  The format for your next appointment:   In Person  Provider:   Kate Sable, MD  Other Instructions N/a

## 2019-12-15 NOTE — Progress Notes (Signed)
Cardiology Office Note:    Date:  12/15/2019   ID:  [name], DOB 1932/04/06, MRN [id number]  PCP:  Lynnell Jude, MD  Cardiologist:  Kate Sable, MD  Electrophysiologist:  None   Referring MD: Lynnell Jude, MD   Chief Complaint  Patient presents with  . New Patient (Initial Visit)    Follow up from [facility]; seizure. Meds reviewed by the pt.'s spouse [name]) verbally. "doing well."     History of Present Illness:    [name] is a 86 y.o. male with a hx of CVA, hypertension who presents as a hospital follow-up.  Patient was admitted about 2 weeks ago to [facility] with altered mental status.  Patient was found by his wife making strange noises at about 3 in the morning.  His wife tried to wake him up but he was unresponsive.  He was also noted to be incontinent of urine.  EMS was called and upon EMS arrival patient was noted to have agonal breathing.    He has a history of CVA in 2011 without any deficits but chart review mentions patient had seizures after that.  EchoCardiogram was obtained and noted to be unremarkable with EF of 75%, moderate LVH, impaired relaxation. Brain MRI no evidence of acute CVA.  CT angiogram of the head and neck noted some atherosclerotic changes of the carotid bifurcations.  Patient was diagnosed with metabolic encephalopathy and possible seizures.  Keppra was added to his regimen.  Statin was also added.  Upon discharge, patient was recommended to see cardiology for an event monitor to rule out any arrhythmias in the etiology of his altered mental status, or prior CVA.  Patient and wife, blood pressure is usually normal at home with systolic around 355H to 741U.  Whenever he presents to his family 110 office or our office today, blood pressures are noted to be in the 160s.  Past Medical History:  Diagnosis Date  . Allergic rhinitis   . Arthropathy   . B12 deficiency anemia   . Chronic neck pain   . CVA (cerebral infarction)   . GERD  (gastroesophageal reflux disease)   . History of seizures   . HTN (hypertension)   . IDA (iron deficiency anemia)   . ITP (idiopathic thrombocytopenic purpura) 07/16/14    Past Surgical History:  Procedure Laterality Date  . CHOLECYSTECTOMY  10/13/1988  . COLONOSCOPY  10/2014, 08/2014   Dr. Gwenlyn Perking  . UPPER GI ENDOSCOPY      Current Medications: Current Meds  Medication Sig  . acetaminophen (TYLENOL) 500 MG tablet Take 500 mg by mouth every 4 (four) hours as needed.   Marland Kitchen aspirin 325 MG tablet Take 1 tablet by mouth daily.  Marland Kitchen atorvastatin (LIPITOR) 10 MG tablet Take 1 tablet (10 mg total) by mouth daily.  Marland Kitchen levETIRAcetam (KEPPRA) 500 MG tablet Take 1 tablet (500 mg total) by mouth 2 (two) times daily.  Marland Kitchen nystatin ointment (MYCOSTATIN) APPLY OINTMENT TOPICALLY TWICE DAILY  . RABEprazole (ACIPHEX) 20 MG tablet Take 1 tablet by mouth daily. 1 hour before meals     Allergies:   Meperidine   Social History   Socioeconomic History  . Marital status: Married    Spouse name: Not on file  . Number of children: Not on file  . Years of education: Not on file  . Highest education level: Not on file  Occupational History  . Not on file  Tobacco Use  . Smoking status: Never Smoker  .  Smokeless tobacco: Never Used  Substance and Sexual Activity  . Alcohol use: No    Alcohol/week: 0.0 standard drinks  . Drug use: No  . Sexual activity: Never  Other Topics Concern  . Not on file  Social History Narrative  . Not on file   Social Determinants of Health   Financial Resource Strain:   . Difficulty of Paying Living Expenses: Not on file  Food Insecurity:   . Worried About Charity fundraiser in the Last Year: Not on file  . Ran Out of Food in the Last Year: Not on file  Transportation Needs:   . Lack of Transportation (Medical): Not on file  . Lack of Transportation (Non-Medical): Not on file  Physical Activity:   . Days of Exercise per Week: Not on file  . Minutes of Exercise  per Session: Not on file  Stress:   . Feeling of Stress : Not on file  Social Connections:   . Frequency of Communication with Friends and Family: Not on file  . Frequency of Social Gatherings with Friends and Family: Not on file  . Attends Religious Services: Not on file  . Active Member of Clubs or Organizations: Not on file  . Attends Archivist Meetings: Not on file  . Marital Status: Not on file     Family History: The patient's family history includes Cirrhosis in his mother; Heart disease in his unknown relative.  ROS:   Please see the history of present illness.     All other systems reviewed and are negative.  EKGs/Labs/Other Studies Reviewed:    The following studies were reviewed today:   EKG:  EKG is  ordered today.  The ekg ordered today demonstrates normal sinus rhythm, normal ECG  Recent Labs: 11/30/2019: ALT 13 12/01/2019: BUN 19; Creatinine, Ser 1.00; Hemoglobin 12.5; Platelets 151; Potassium 3.6; Sodium 139  Recent Lipid Panel    Component Value Date/Time   CHOL 145 12/01/2019 0531   TRIG 87 12/01/2019 0531   HDL 58 12/01/2019 0531   CHOLHDL 2.5 12/01/2019 0531   VLDL 17 12/01/2019 0531   LDLCALC 70 12/01/2019 0531    Physical Exam:    VS:  BP (!) 166/89 (BP Location: Left Arm, Patient Position: Sitting, Cuff Size: Normal)   Pulse 71   Ht 5\' 10"  (1.778 m)   Wt 173 lb (78.5 kg)   BMI 24.82 kg/m     Wt Readings from Last 3 Encounters:  12/15/19 173 lb (78.5 kg)  11/30/19 180 lb (81.6 kg)  09/23/19 169 lb 8 oz (76.9 kg)     GEN:  Well nourished, well developed in no acute distress HEENT: Normal NECK: No JVD; No carotid bruits LYMPHATICS: No lymphadenopathy CARDIAC: RRR, 2/6 systolic murmur RUSB, rubs, gallops RESPIRATORY:  Clear to auscultation without rales, wheezing or rhonchi  ABDOMEN: Soft, non-tender, non-distended MUSCULOSKELETAL:  No edema; No deformity  SKIN: Warm and dry NEUROLOGIC:  Alert and oriented x 3 PSYCHIATRIC:   Normal affect   ASSESSMENT:    1. Cerebrovascular accident (CVA), unspecified mechanism ([place])   2. White coat syndrome with diagnosis of hypertension    PLAN:    In order of problems listed above:  1. Patient with recent admission of altered mental status deemed secondary to seizures.  Started on Keppra.  History of prior CVA with no residual deficits.  Will get a cardiac monitor x2 weeks to evaluate any underlying arrhythmias that can be contributing.  Echocardiogram obtained in  the hospital showed normal ejection fraction with grade 1 diastolic dysfunction. 2. Patient's blood pressure elevated today.  They usually run normal at home.  I advised wife and patient to check blood pressures daily and keep a log at follow-up visit.  Follow-up in 1 month after cardiac monitor  This note was generated in part or whole with voice recognition software. Voice recognition is usually quite accurate but there are transcription errors that can and very often do occur. I apologize for any typographical errors that were not detected and corrected.  Medication Adjustments/Labs and Tests Ordered: Current medicines are reviewed at length with the patient today.  Concerns regarding medicines are outlined above.  Orders Placed This Encounter  Procedures  . LONG TERM MONITOR (3-14 DAYS)  . EKG 12-Lead   No orders of the defined types were placed in this encounter.   Patient Instructions  Medication Instructions:  - Your physician recommends that you continue on your current medications as directed. Please refer to the Current Medication list given to you today.   *If you need a refill on your cardiac medications before your next appointment, please call your pharmacy*  Lab Work: - none ordered  If you have labs (blood work) drawn today and your tests are completely normal, you will receive your results only by: Marland Kitchen MyChart Message (if you have MyChart) OR . A paper copy in the mail If you have any  lab test that is abnormal or we need to change your treatment, we will call you to review the results.  Testing/Procedures: - Your physician has recommended that you wear a 14 day Zio monitor (placed in office today). This monitor is a medical device that records the heart's electrical activity. Doctors most often use these monitors to diagnose arrhythmias. Arrhythmias are problems with the speed or rhythm of the heartbeat. The monitor is a small device applied to your chest. You can wear one while you do your normal daily activities. While wearing this monitor if you have any symptoms to push the button and record what you felt. Once you have worn this monitor for the period of time provider prescribed (Usually 14 days), you will return the monitor device in the postage paid box. Once it is returned they will download the data collected and provide [place] with a report which the provider will then review and we will call you with those results. Important tips:  1. Avoid showering during the first 24 hours of wearing the monitor. 2. Avoid excessive sweating to help maximize wear time. 3. Do not submerge the device, no hot tubs, and no swimming pools. 4. Keep any lotions or oils away from the patch. 5. After 24 hours you may shower with the patch on. Take brief showers with your back facing the shower head.  6. Do not remove patch once it has been placed because that will interrupt data and decrease adhesive wear time. 7. Push the button when you have any symptoms and write down what you were feeling. 8. Once you have completed wearing your monitor, remove and place into box which has postage paid and place in your outgoing mailbox.  9. If for some reason you have misplaced your box then call our office and we can provide another box and/or mail it off for you.        Follow-Up: At [facility], you and your health needs are our priority.  As part of our continuing mission to provide you with  exceptional  heart care, we have created designated Provider Care Teams.  These Care Teams include your primary Cardiologist (physician) and Advanced Practice Providers (APPs -  Physician Assistants and Nurse Practitioners) who all work together to provide you with the care you need, when you need it.  Your next appointment:   1 month(s)  The format for your next appointment:   In Person  Provider:   Kate Sable, MD  Other Instructions N/a       Signed, Kate Sable, MD  12/15/2019 5:15 PM    [place]

## 2019-12-24 DIAGNOSIS — I10 Essential (primary) hypertension: Secondary | ICD-10-CM | POA: Diagnosis not present

## 2019-12-24 DIAGNOSIS — R450 Nervousness: Secondary | ICD-10-CM | POA: Diagnosis not present

## 2019-12-24 DIAGNOSIS — I129 Hypertensive chronic kidney disease with stage 1 through stage 4 chronic kidney disease, or unspecified chronic kidney disease: Secondary | ICD-10-CM | POA: Diagnosis not present

## 2019-12-24 DIAGNOSIS — E7800 Pure hypercholesterolemia, unspecified: Secondary | ICD-10-CM | POA: Diagnosis not present

## 2019-12-24 DIAGNOSIS — N183 Chronic kidney disease, stage 3 unspecified: Secondary | ICD-10-CM | POA: Diagnosis not present

## 2019-12-24 DIAGNOSIS — E785 Hyperlipidemia, unspecified: Secondary | ICD-10-CM | POA: Diagnosis not present

## 2019-12-24 DIAGNOSIS — Z8673 Personal history of transient ischemic attack (TIA), and cerebral infarction without residual deficits: Secondary | ICD-10-CM | POA: Diagnosis not present

## 2019-12-24 DIAGNOSIS — R600 Localized edema: Secondary | ICD-10-CM | POA: Diagnosis not present

## 2019-12-25 DIAGNOSIS — Z8679 Personal history of other diseases of the circulatory system: Secondary | ICD-10-CM | POA: Diagnosis not present

## 2019-12-25 DIAGNOSIS — R569 Unspecified convulsions: Secondary | ICD-10-CM | POA: Diagnosis not present

## 2019-12-25 DIAGNOSIS — Z8673 Personal history of transient ischemic attack (TIA), and cerebral infarction without residual deficits: Secondary | ICD-10-CM | POA: Diagnosis not present

## 2019-12-26 DIAGNOSIS — R030 Elevated blood-pressure reading, without diagnosis of hypertension: Secondary | ICD-10-CM | POA: Diagnosis not present

## 2020-01-06 DIAGNOSIS — G464 Cerebellar stroke syndrome: Secondary | ICD-10-CM | POA: Diagnosis not present

## 2020-01-07 ENCOUNTER — Telehealth: Payer: Self-pay

## 2020-01-07 NOTE — Telephone Encounter (Signed)
Called to give the patient Cardiac monitor results. Unable to lmom, No voicemail avalable. Telephone encounter started.

## 2020-01-07 NOTE — Telephone Encounter (Signed)
-----   Message from Kate Sable, MD sent at 01/07/2020 11:19 AM EST ----- 6 beat run of VT, occasional SVT, no evidence of atrial fibrillation noted to account for patient's history of stroke.

## 2020-01-08 NOTE — Telephone Encounter (Signed)
Results called to patient's wife, ok per DPR. SHe is aware of the upcoming appointment.

## 2020-01-12 ENCOUNTER — Ambulatory Visit (INDEPENDENT_AMBULATORY_CARE_PROVIDER_SITE_OTHER): Payer: PPO | Admitting: Cardiology

## 2020-01-12 ENCOUNTER — Encounter: Payer: Self-pay | Admitting: Cardiology

## 2020-01-12 ENCOUNTER — Other Ambulatory Visit: Payer: Self-pay

## 2020-01-12 VITALS — BP 120/70 | HR 68 | Ht 70.0 in | Wt 166.5 lb

## 2020-01-12 DIAGNOSIS — R001 Bradycardia, unspecified: Secondary | ICD-10-CM

## 2020-01-12 DIAGNOSIS — I639 Cerebral infarction, unspecified: Secondary | ICD-10-CM | POA: Diagnosis not present

## 2020-01-12 DIAGNOSIS — I10 Essential (primary) hypertension: Secondary | ICD-10-CM | POA: Diagnosis not present

## 2020-01-12 NOTE — Patient Instructions (Signed)
Medication Instructions:  Your physician recommends that you continue on your current medications as directed. Please refer to the Current Medication list given to you today.  *If you need a refill on your cardiac medications before your next appointment, please call your pharmacy*   Lab Work: None If you have labs (blood work) drawn today and your tests are completely normal, you will receive your results only by: Marland Kitchen MyChart Message (if you have MyChart) OR . A paper copy in the mail If you have any lab test that is abnormal or we need to change your treatment, we will call you to review the results.   Testing/Procedures: None    Follow-Up: At [facility], you and your health needs are our priority.  As part of our continuing mission to provide you with exceptional heart care, we have created designated Provider Care Teams.  These Care Teams include your primary Cardiologist (physician) and Advanced Practice Providers (APPs -  Physician Assistants and Nurse Practitioners) who all work together to provide you with the care you need, when you need it.  We recommend signing up for the patient portal called "MyChart".  Sign up information is provided on this After Visit Summary.  MyChart is used to connect with patients for Virtual Visits (Telemedicine).  Patients are able to view lab/test results, encounter notes, upcoming appointments, etc.  Non-urgent messages can be sent to your provider as well.   To learn more about what you can do with MyChart, go to [email or web address].    Your next appointment:   As needed   The format for your next appointment:   In Person  Provider:   Kate Sable, MD   Other Instructions

## 2020-01-12 NOTE — Progress Notes (Signed)
Cardiology Office Note:    Date:  01/12/2020   ID:  [name], DOB 11-29-1931, MRN [id number]  PCP:  Lynnell Jude, MD  Cardiologist:  Kate Sable, MD  Electrophysiologist:  None   Referring MD: Lynnell Jude, MD   Chief Complaint  Patient presents with  . office visit    1 month F/U-Patient reports laceration on LLE that he is concerned may be infected; Meds verbally reviewed with patient.    History of Present Illness:    [name] is a 86 y.o. male with a hx of CVA, hypertension who presents for follow-up.  Patient was admitted  to [facility] 1/17/2021with altered mental status.  Patient was found by his wife making strange noises at about 3 in the morning.  His wife tried to wake him up but he was unresponsive.  He was also noted to be incontinent of urine.  EMS was called and upon EMS arrival patient was noted to have agonal breathing.    He has a history of CVA in 2011 without any deficits but chart review mentions patient had seizures after that.  EchoCardiogram was obtained and noted to be unremarkable with EF of 75%, moderate LVH, impaired relaxation. Brain MRI no evidence of acute CVA.  CT angiogram of the head and neck noted some atherosclerotic changes of the carotid bifurcations.  Patient was diagnosed with metabolic encephalopathy and possible seizures.  Keppra was added to his regimen.  Statin was also added.  Upon discharge, patient was recommended to see cardiology for an event monitor to rule out any arrhythmias in the etiology of his altered mental status, or prior CVA.   Past Medical History:  Diagnosis Date  . Allergic rhinitis   . Arthropathy   . B12 deficiency anemia   . Chronic neck pain   . CVA (cerebral infarction)   . GERD (gastroesophageal reflux disease)   . History of seizures   . HTN (hypertension)   . IDA (iron deficiency anemia)   . ITP (idiopathic thrombocytopenic purpura) 07/16/14    Past Surgical History:  Procedure Laterality Date  .  CHOLECYSTECTOMY  10/13/1988  . COLONOSCOPY  10/2014, 08/2014   Dr. Gwenlyn Perking  . UPPER GI ENDOSCOPY      Current Medications: Current Meds  Medication Sig  . acetaminophen (TYLENOL) 500 MG tablet Take 500 mg by mouth every 4 (four) hours as needed.   Marland Kitchen aspirin 325 MG tablet Take 1 tablet by mouth daily.  Marland Kitchen atorvastatin (LIPITOR) 10 MG tablet Take 1 tablet (10 mg total) by mouth daily.  Marland Kitchen levETIRAcetam (KEPPRA) 500 MG tablet Take 1 tablet (500 mg total) by mouth 2 (two) times daily.  Marland Kitchen nystatin ointment (MYCOSTATIN) APPLY OINTMENT TOPICALLY TWICE DAILY  . Probiotic Product ([place]) Take by mouth daily.  . RABEprazole (ACIPHEX) 20 MG tablet Take 1 tablet by mouth daily. 1 hour before meals  . triamterene-hydrochlorothiazide (DYAZIDE) 37.5-25 MG capsule Take 1 capsule by mouth as directed. Take if systolic BP >741.     Allergies:   Meperidine   Social History   Socioeconomic History  . Marital status: Married    Spouse name: Not on file  . Number of children: Not on file  . Years of education: Not on file  . Highest education level: Not on file  Occupational History  . Not on file  Tobacco Use  . Smoking status: Never Smoker  . Smokeless tobacco: Never Used  Substance and Sexual Activity  .  Alcohol use: No    Alcohol/week: 0.0 standard drinks  . Drug use: No  . Sexual activity: Never  Other Topics Concern  . Not on file  Social History Narrative  . Not on file   Social Determinants of Health   Financial Resource Strain:   . Difficulty of Paying Living Expenses: Not on file  Food Insecurity:   . Worried About Charity fundraiser in the Last Year: Not on file  . Ran Out of Food in the Last Year: Not on file  Transportation Needs:   . Lack of Transportation (Medical): Not on file  . Lack of Transportation (Non-Medical): Not on file  Physical Activity:   . Days of Exercise per Week: Not on file  . Minutes of Exercise per Session: Not on file  Stress:     . Feeling of Stress : Not on file  Social Connections:   . Frequency of Communication with Friends and Family: Not on file  . Frequency of Social Gatherings with Friends and Family: Not on file  . Attends Religious Services: Not on file  . Active Member of Clubs or Organizations: Not on file  . Attends Archivist Meetings: Not on file  . Marital Status: Not on file     Family History: The patient's family history includes Cirrhosis in his mother; Heart disease in an other family member.  ROS:   Please see the history of present illness.     All other systems reviewed and are negative.  EKGs/Labs/Other Studies Reviewed:    The following studies were reviewed today:   EKG:  EKG is  ordered today.  The ekg ordered today demonstrates normal sinus rhythm, normal ECG  Recent Labs: 11/30/2019: ALT 13 12/01/2019: BUN 19; Creatinine, Ser 1.00; Hemoglobin 12.5; Platelets 151; Potassium 3.6; Sodium 139  Recent Lipid Panel    Component Value Date/Time   CHOL 145 12/01/2019 0531   TRIG 87 12/01/2019 0531   HDL 58 12/01/2019 0531   CHOLHDL 2.5 12/01/2019 0531   VLDL 17 12/01/2019 0531   LDLCALC 70 12/01/2019 0531    Physical Exam:    VS:  BP 120/70 (BP Location: Left Arm, Patient Position: Sitting, Cuff Size: Normal)   Pulse 68   Ht 5\' 10"  (1.778 m)   Wt 166 lb 8 oz (75.5 kg)   SpO2 97%   BMI 23.89 kg/m     Wt Readings from Last 3 Encounters:  01/12/20 166 lb 8 oz (75.5 kg)  12/15/19 173 lb (78.5 kg)  11/30/19 180 lb (81.6 kg)     GEN:  Well nourished, well developed in no acute distress HEENT: Normal NECK: No JVD; No carotid bruits LYMPHATICS: No lymphadenopathy CARDIAC: RRR, 2/6 systolic murmur RUSB, rubs, gallops RESPIRATORY:  Clear to auscultation without rales, wheezing or rhonchi  ABDOMEN: Soft, non-tender, non-distended MUSCULOSKELETAL:  No edema; No deformity  SKIN: Warm and dry NEUROLOGIC:  Alert and oriented x 3 PSYCHIATRIC:  Normal affect    ASSESSMENT:    1. Cerebrovascular accident (CVA), unspecified mechanism ([place])   2. White coat syndrome with diagnosis of hypertension   3. Bradycardia    PLAN:    In order of problems listed above:  1. Patient with recent admission of altered mental status deemed secondary to seizures.  Started on Keppra.  History of prior CVA with no residual deficits.  Echocardiogram obtained in the hospital showed normal ejection fraction with grade 1 diastolic dysfunction.  Cardiac monitor showed occasional SVT,  no evidence for atrial fibrillation or atrial flutter. 2. History of hypertension, patient's blood appears well controlled.  Continue current BP meds.  Follow-up as needed  This note was generated in part or whole with voice recognition software. Voice recognition is usually quite accurate but there are transcription errors that can and very often do occur. I apologize for any typographical errors that were not detected and corrected.  Medication Adjustments/Labs and Tests Ordered: Current medicines are reviewed at length with the patient today.  Concerns regarding medicines are outlined above.  Orders Placed This Encounter  Procedures  . EKG 12-Lead   No orders of the defined types were placed in this encounter.   Patient Instructions  Medication Instructions:  Your physician recommends that you continue on your current medications as directed. Please refer to the Current Medication list given to you today.  *If you need a refill on your cardiac medications before your next appointment, please call your pharmacy*   Lab Work: None If you have labs (blood work) drawn today and your tests are completely normal, you will receive your results only by: Marland Kitchen MyChart Message (if you have MyChart) OR . A paper copy in the mail If you have any lab test that is abnormal or we need to change your treatment, we will call you to review the  results.   Testing/Procedures: None    Follow-Up: At [facility], you and your health needs are our priority.  As part of our continuing mission to provide you with exceptional heart care, we have created designated Provider Care Teams.  These Care Teams include your primary Cardiologist (physician) and Advanced Practice Providers (APPs -  Physician Assistants and Nurse Practitioners) who all work together to provide you with the care you need, when you need it.  We recommend signing up for the patient portal called "MyChart".  Sign up information is provided on this After Visit Summary.  MyChart is used to connect with patients for Virtual Visits (Telemedicine).  Patients are able to view lab/test results, encounter notes, upcoming appointments, etc.  Non-urgent messages can be sent to your provider as well.   To learn more about what you can do with MyChart, go to [email or web address].    Your next appointment:   As needed   The format for your next appointment:   In Person  Provider:   Kate Sable, MD   Other Instructions      Signed, Kate Sable, MD  01/12/2020 5:26 PM    [place]

## 2020-01-19 DIAGNOSIS — L03116 Cellulitis of left lower limb: Secondary | ICD-10-CM | POA: Diagnosis not present

## 2020-01-19 DIAGNOSIS — L299 Pruritus, unspecified: Secondary | ICD-10-CM | POA: Diagnosis not present

## 2020-01-19 DIAGNOSIS — Z6828 Body mass index (BMI) 28.0-28.9, adult: Secondary | ICD-10-CM | POA: Diagnosis not present

## 2020-01-20 DIAGNOSIS — R569 Unspecified convulsions: Secondary | ICD-10-CM | POA: Diagnosis not present

## 2020-02-04 DIAGNOSIS — Z8673 Personal history of transient ischemic attack (TIA), and cerebral infarction without residual deficits: Secondary | ICD-10-CM | POA: Diagnosis not present

## 2020-02-04 DIAGNOSIS — R569 Unspecified convulsions: Secondary | ICD-10-CM | POA: Diagnosis not present

## 2020-02-24 DIAGNOSIS — R600 Localized edema: Secondary | ICD-10-CM | POA: Diagnosis not present

## 2020-02-24 DIAGNOSIS — I872 Venous insufficiency (chronic) (peripheral): Secondary | ICD-10-CM | POA: Diagnosis not present

## 2020-02-24 DIAGNOSIS — S81802D Unspecified open wound, left lower leg, subsequent encounter: Secondary | ICD-10-CM | POA: Diagnosis not present

## 2020-02-24 DIAGNOSIS — Z6827 Body mass index (BMI) 27.0-27.9, adult: Secondary | ICD-10-CM | POA: Diagnosis not present

## 2020-03-04 ENCOUNTER — Other Ambulatory Visit: Payer: Self-pay

## 2020-03-04 ENCOUNTER — Encounter: Payer: PPO | Attending: Internal Medicine | Admitting: Internal Medicine

## 2020-03-04 DIAGNOSIS — L97811 Non-pressure chronic ulcer of other part of right lower leg limited to breakdown of skin: Secondary | ICD-10-CM | POA: Insufficient documentation

## 2020-03-04 DIAGNOSIS — Z992 Dependence on renal dialysis: Secondary | ICD-10-CM | POA: Insufficient documentation

## 2020-03-04 DIAGNOSIS — N183 Chronic kidney disease, stage 3 unspecified: Secondary | ICD-10-CM | POA: Diagnosis not present

## 2020-03-04 DIAGNOSIS — I70213 Atherosclerosis of native arteries of extremities with intermittent claudication, bilateral legs: Secondary | ICD-10-CM | POA: Insufficient documentation

## 2020-03-04 DIAGNOSIS — I87333 Chronic venous hypertension (idiopathic) with ulcer and inflammation of bilateral lower extremity: Secondary | ICD-10-CM | POA: Insufficient documentation

## 2020-03-04 DIAGNOSIS — I129 Hypertensive chronic kidney disease with stage 1 through stage 4 chronic kidney disease, or unspecified chronic kidney disease: Secondary | ICD-10-CM | POA: Insufficient documentation

## 2020-03-04 DIAGNOSIS — L97821 Non-pressure chronic ulcer of other part of left lower leg limited to breakdown of skin: Secondary | ICD-10-CM | POA: Diagnosis not present

## 2020-03-04 NOTE — Progress Notes (Signed)
[name] ([id number]) Visit Report for 03/04/2020 Chief Complaint Document Details Patient Name: [name], [name]. Date of Service: 03/04/2020 1:00 PM Medical Record Number: [id number] Patient Account Number: [id number] Date of Birth/Sex: 14-May-1932 (86 y.o. M) Treating RN: [name] Primary Care Provider: Lavera Guise Other Clinician: Referring Provider: Lavera Guise Treating Provider/Extender: Tito Dine in Treatment: 0 Information Obtained from: Patient Chief Complaint 03/04/2020; patient is here for review of bilateral small lower extremity wounds Electronic Signature(s) Signed: 03/04/2020 4:21:14 PM By: Linton Ham MD Entered By: Linton Ham on 03/04/2020 14:32:18 Shinsky, [name] ([id number]) -------------------------------------------------------------------------------- HPI Details Patient Name: [name], [name] Date of Service: 03/04/2020 1:00 PM Medical Record Number: [id number] Patient Account Number: [id number] Date of Birth/Sex: 07-13-1932 (86 y.o. M) Treating RN: [name] Primary Care Provider: Lavera Guise Other Clinician: Referring Provider: Lavera Guise Treating Provider/Extender: Tito Dine in Treatment: 0 History of Present Illness HPI Description: ADMISSION 03/04/2020 Patient is an 86 year old man who arrives accompanied by his wife. He states that he has had wounds on his bilateral lower extremities that started with increased swelling in his lower extremities. He has quite significant bilateral lower extremity pitting edema but he states that this is only been there for 2 or 3 weeks. Says he saw his primary doctor who put him on what sounds like a diuretic. He has developed small wounds on the left lateral which is actually the most prominent wound. Very small superficial wounds on the left anterior and a cluster of very small wounds on the right lateral. These are not particularly painful although he states that his legs are  itchy. on arrival in the clinic our intake nurse could not obtain pulses bilaterally. She could not maintain of enough of a Doppler in the right dorsalis pedis to get an ABI and on the left she could not even obtain it by Doppler. The patient does have pain in his legs when he walks. He says 10 to 15 minutes he has to sit down and rest although he thought that this was because of his knees. He also mentions that his wounds are painful at night when he is lying in bed sometimes waking him up at 5:00 in the morning. Past medical history includes hypertension, CVA in 2011 with no residual effects, osteoarthritis of both knees, recent seizure disorder but apparently complete work-up was negative, ITP, vitamin B12 deficiency and stage III chronic renal failure We could not obtain ABIs in our clinic as noted above Electronic Signature(s) Signed: 03/04/2020 4:21:14 PM By: Linton Ham MD Entered By: Linton Ham on 03/04/2020 14:36:45 Wroe, [name] ([id number]) -------------------------------------------------------------------------------- Physical Exam Details Patient Name: [name] Mole B. Date of Service: 03/04/2020 1:00 PM Medical Record Number: [id number] Patient Account Number: [id number] Date of Birth/Sex: Sep 13, 1932 (86 y.o. M) Treating RN: [name] Primary Care Provider: Lavera Guise Other Clinician: Referring Provider: Lavera Guise Treating Provider/Extender: Tito Dine in Treatment: 0 Constitutional Patient is hypertensive.. Pulse regular and within target range for patient.Marland Kitchen Respirations regular, non-labored and within target range.. Temperature is normal and within the target range for the patient.Marland Kitchen appears in no distress. Eyes Conjunctivae clear. No discharge. Respiratory Respiratory effort is easy and symmetric bilaterally. Rate is normal at rest and on room air.. Bilateral breath sounds are clear and equal in all lobes with no wheezes, rales or  rhonchi.. Cardiovascular 3 out of 6 short minutes midsystolic murmur maximum of the lower left sternal border JVP is not elevated. Pedal pulses absent  bilaterally.. 2-3+ pitting edema to two thirds way up his calves. Skin changes of chronic venous insufficiency. Integumentary (Hair, Skin) Changes of chronic venous insufficiency. Psychiatric No evidence of depression, anxiety, or agitation. Calm, cooperative, and communicative. Appropriate interactions and affect.. Notes Wound exam; the most prominent wound is on the left lateral calf small oval-shaped wound. There is some debris in the surface of this under illumination. He has a very small superficial area on the left anterior tibia area oOn the right lateral distal lower extremity there is a cluster of 3 or 4 very small superficial wounds. No evidence of surrounding infection at either site. Electronic Signature(s) Signed: 03/04/2020 4:21:14 PM By: Linton Ham MD Entered By: Linton Ham on 03/04/2020 14:39:35 Boster, [name] ([id number]) -------------------------------------------------------------------------------- Physician Orders Details Patient Name: [name], [name] Date of Service: 03/04/2020 1:00 PM Medical Record Number: [id number] Patient Account Number: [id number] Date of Birth/Sex: [birth year] (86 y.o. M) Treating RN: [name] Primary Care Provider: Lavera Guise Other Clinician: Referring Provider: Lavera Guise Treating Provider/Extender: Tito Dine in Treatment: 0 Verbal / Phone Orders: No Diagnosis Coding Wound Cleansing Wound #1 Right,Anterior Lower Leg o Clean wound with Normal Saline. Wound #2 Left,Anterior Lower Leg o Clean wound with Normal Saline. Wound #3 Left,Lateral Lower Leg o Clean wound with Normal Saline. Skin Barriers/Peri-Wound Care Wound #1 Right,Anterior Lower Leg o Triamcinolone Acetonide Ointment (TCA) Wound #2 Left,Anterior Lower Leg o Triamcinolone Acetonide  Ointment (TCA) Wound #3 Left,Lateral Lower Leg o Triamcinolone Acetonide Ointment (TCA) Primary Wound Dressing Wound #1 Right,Anterior Lower Leg o Silver Alginate Wound #2 Left,Anterior Lower Leg o Silver Alginate Wound #3 Left,Lateral Lower Leg o Silver Alginate Secondary Dressing Wound #1 Right,Anterior Lower Leg o ABD pad o Kerlix and Coban Wound #2 Left,Anterior Lower Leg o ABD pad o Kerlix and Coban Wound #3 Left,Lateral Lower Leg o ABD pad o Kerlix and Coban Dressing Change Frequency Wound #1 Right,Anterior Lower Leg o Change dressing every week Wound #2 Left,Anterior Lower Leg o Change dressing every week Wound #3 Left,Lateral Lower Leg o Change dressing every week HELAMAN, MECCA B. ([id number]) Follow-up Appointments Wound #1 Right,Anterior Lower Leg o Return Appointment in 1 week. Wound #2 Left,Anterior Lower Leg o Return Appointment in 1 week. Wound #3 Left,Lateral Lower Leg o Return Appointment in 1 week. Edema Control Wound #1 Right,Anterior Lower Leg o Kerlix and Coban - Bilateral o Elevate legs to the level of the heart and pump ankles as often as possible Wound #2 Left,Anterior Lower Leg o Kerlix and Coban - Bilateral o Elevate legs to the level of the heart and pump ankles as often as possible Wound #3 Left,Lateral Lower Leg o Kerlix and Coban - Bilateral o Elevate legs to the level of the heart and pump ankles as often as possible Consults o Vascular - Bilateral ABIs TBIs Electronic Signature(s) Signed: 03/04/2020 3:53:36 PM By: [name] Signed: 03/04/2020 4:21:14 PM By: Linton Ham MD Entered By: [name] on 03/04/2020 13:50:22 Casas, [name] ([id number]) -------------------------------------------------------------------------------- Problem List Details Patient Name: KENDRICKS, REAP B. Date of Service: 03/04/2020 1:00 PM Medical Record Number: [id number] Patient Account Number: [id number] Date  of Birth/Sex: [birth year] (86 y.o. M) Treating RN: [name] Primary Care Provider: Lavera Guise Other Clinician: Referring Provider: Lavera Guise Treating Provider/Extender: Tito Dine in Treatment: 0 Active Problems ICD-10 Evaluated Encounter Code Description Active Date Today Diagnosis I87.333 Chronic venous hypertension (idiopathic) with ulcer and inflammation of 03/04/2020 No Yes bilateral lower extremity L97.821 Non-pressure chronic ulcer  of other part of left lower leg limited to 03/04/2020 No Yes breakdown of skin L97.811 Non-pressure chronic ulcer of other part of right lower leg limited to 03/04/2020 No Yes breakdown of skin I70.213 Atherosclerosis of native arteries of extremities with intermittent 03/04/2020 No Yes claudication, bilateral legs Inactive Problems Resolved Problems Electronic Signature(s) Signed: 03/04/2020 4:21:14 PM By: Linton Ham MD Entered By: Linton Ham on 03/04/2020 14:06:11 Carrick, [name] ([id number]) -------------------------------------------------------------------------------- Progress Note Details Patient Name: [name] Mole B. Date of Service: 03/04/2020 1:00 PM Medical Record Number: [id number] Patient Account Number: [id number] Date of Birth/Sex: 01/06/1932 (86 y.o. M) Treating RN: [name] Primary Care Provider: Lavera Guise Other Clinician: Referring Provider: Lavera Guise Treating Provider/Extender: Tito Dine in Treatment: 0 Subjective Chief Complaint Information obtained from Patient 03/04/2020; patient is here for review of bilateral small lower extremity wounds History of Present Illness (HPI) ADMISSION 03/04/2020 Patient is an 86 year old man who arrives accompanied by his wife. He states that he has had wounds on his bilateral lower extremities that started with increased swelling in his lower extremities. He has quite significant bilateral lower extremity pitting edema but he states that this is  only been there for 2 or 3 weeks. Says he saw his primary doctor who put him on what sounds like a diuretic. He has developed small wounds on the left lateral which is actually the most prominent wound. Very small superficial wounds on the left anterior and a cluster of very small wounds on the right lateral. These are not particularly painful although he states that his legs are itchy. on arrival in the clinic our intake nurse could not obtain pulses bilaterally. She could not maintain of enough of a Doppler in the right dorsalis pedis to get an ABI and on the left she could not even obtain it by Doppler. The patient does have pain in his legs when he walks. He says 10 to 15 minutes he has to sit down and rest although he thought that this was because of his knees. He also mentions that his wounds are painful at night when he is lying in bed sometimes waking him up at 5:00 in the morning. Past medical history includes hypertension, CVA in 2011 with no residual effects, osteoarthritis of both knees, recent seizure disorder but apparently complete work-up was negative, ITP, vitamin B12 deficiency and stage III chronic renal failure We could not obtain ABIs in our clinic as noted above Patient History Allergies morphine (Reaction: hives) Family History Diabetes - Siblings, Heart Disease - Siblings, Hypertension - Siblings, No family history of Cancer, Hereditary Spherocytosis, Kidney Disease, Lung Disease, Seizures, Stroke, Thyroid Problems, Tuberculosis. Social History Never smoker, Marital Status - Married, Alcohol Use - Never, Drug Use - No History, Caffeine Use - Daily. Medical History Cardiovascular Patient has history of Hypertension Genitourinary Patient has history of End Stage Renal Disease - CKD stage 3 Integumentary (Skin) Denies history of History of Burn, History of pressure wounds Musculoskeletal Patient has history of Osteoarthritis Denies history of Gout, Rheumatoid  Arthritis, Osteomyelitis Neurologic Patient has history of Seizure Disorder - no medication Denies history of Dementia, Neuropathy, Quadriplegia, Paraplegia Medical And Surgical History Notes Cardiovascular HLD Neurologic hx CVA in 2011 Review of Systems (ROS) Kolenovic, Masiyah B. ([id number]) Constitutional Symptoms (General Health) Denies complaints or symptoms of Fatigue, Fever, Chills, Marked Weight Change. Eyes Denies complaints or symptoms of Dry Eyes, Vision Changes, Glasses / Contacts. Ear/Nose/Mouth/Throat Denies complaints or symptoms of Difficult clearing ears, Sinusitis. Hematologic/Lymphatic Denies  complaints or symptoms of Bleeding / Clotting Disorders, Human Immunodeficiency Virus. Respiratory Denies complaints or symptoms of Chronic or frequent coughs, Shortness of Breath. Cardiovascular Complains or has symptoms of LE edema. Denies complaints or symptoms of Chest pain. Gastrointestinal Denies complaints or symptoms of Frequent diarrhea, Nausea, Vomiting. Endocrine Denies complaints or symptoms of Hepatitis, Thyroid disease, Polydypsia (Excessive Thirst). Genitourinary Complains or has symptoms of Kidney failure/ Dialysis - CKD stage 3. Denies complaints or symptoms of Incontinence/dribbling. Immunological Denies complaints or symptoms of Hives, Itching. Integumentary (Skin) Complains or has symptoms of Wounds. Denies complaints or symptoms of Bleeding or bruising tendency, Breakdown, Swelling. Musculoskeletal Denies complaints or symptoms of Muscle Pain, Muscle Weakness. Neurologic Denies complaints or symptoms of Numbness/parasthesias, Focal/Weakness. Psychiatric Denies complaints or symptoms of Anxiety, Claustrophobia. Objective Constitutional Patient is hypertensive.. Pulse regular and within target range for patient.Marland Kitchen Respirations regular, non-labored and within target range.. Temperature is normal and within the target range for the patient.Marland Kitchen appears in no  distress. Vitals Time Taken: 1:00 PM, Height: 69 in, Source: Stated, Weight: 173 lbs, Source: Measured, BMI: 25.5, Temperature: 97.7 F, Pulse: 60 bpm, Respiratory Rate: 18 breaths/min, Blood Pressure: 163/69 mmHg. Eyes Conjunctivae clear. No discharge. Respiratory Respiratory effort is easy and symmetric bilaterally. Rate is normal at rest and on room air.. Bilateral breath sounds are clear and equal in all lobes with no wheezes, rales or rhonchi.. Cardiovascular 3 out of 6 short minutes midsystolic murmur maximum of the lower left sternal border JVP is not elevated. Pedal pulses absent bilaterally.. 2-3+ pitting edema to two thirds way up his calves. Skin changes of chronic venous insufficiency. Psychiatric No evidence of depression, anxiety, or agitation. Calm, cooperative, and communicative. Appropriate interactions and affect.. General Notes: Wound exam; the most prominent wound is on the left lateral calf small oval-shaped wound. There is some debris in the surface of this under illumination. He has a very small superficial area on the left anterior tibia area On the right lateral distal lower extremity there is a cluster of 3 or 4 very small superficial wounds. No evidence of surrounding infection at either site. Integumentary (Hair, Skin) Changes of chronic venous insufficiency. NIKI, COSMAN ([id number]) Wound #1 status is Open. Original cause of wound was Gradually Appeared. The wound is located on the Right,Anterior Lower Leg. The wound measures 1.5cm length x 1.5cm width x 0.1cm depth; 1.767cm^2 area and 0.177cm^3 volume. There is Fat Layer (Subcutaneous Tissue) Exposed exposed. There is no tunneling or undermining noted. There is a medium amount of serous drainage noted. The wound margin is flat and intact. There is large (67-100%) pink granulation within the wound bed. There is a small (1-33%) amount of necrotic tissue within the wound bed including Adherent Slough. Wound #2  status is Open. Original cause of wound was Gradually Appeared. The wound is located on the Left,Anterior Lower Leg. The wound measures 0.4cm length x 0.2cm width x 0.1cm depth; 0.063cm^2 area and 0.006cm^3 volume. There is Fat Layer (Subcutaneous Tissue) Exposed exposed. There is no tunneling or undermining noted. There is a medium amount of serous drainage noted. The wound margin is flat and intact. There is medium (34-66%) pink granulation within the wound bed. There is a medium (34-66%) amount of necrotic tissue within the wound bed including Adherent Slough. Wound #3 status is Open. Original cause of wound was Gradually Appeared. The wound is located on the Left,Lateral Lower Leg. The wound measures 1.5cm length x 0.5cm width x 0.1cm depth; 0.589cm^2 area and 0.059cm^3 volume.  There is Fat Layer (Subcutaneous Tissue) Exposed exposed. There is no tunneling or undermining noted. There is a medium amount of serous drainage noted. The wound margin is flat and intact. There is small (1-33%) pink granulation within the wound bed. There is a large (67-100%) amount of necrotic tissue within the wound bed including Adherent Slough. Assessment Active Problems ICD-10 Chronic venous hypertension (idiopathic) with ulcer and inflammation of bilateral lower extremity Non-pressure chronic ulcer of other part of left lower leg limited to breakdown of skin Non-pressure chronic ulcer of other part of right lower leg limited to breakdown of skin Atherosclerosis of native arteries of extremities with intermittent claudication, bilateral legs Plan Wound Cleansing: Wound #1 Right,Anterior Lower Leg: Clean wound with Normal Saline. Wound #2 Left,Anterior Lower Leg: Clean wound with Normal Saline. Wound #3 Left,Lateral Lower Leg: Clean wound with Normal Saline. Skin Barriers/Peri-Wound Care: Wound #1 Right,Anterior Lower Leg: Triamcinolone Acetonide Ointment (TCA) Wound #2 Left,Anterior Lower  Leg: Triamcinolone Acetonide Ointment (TCA) Wound #3 Left,Lateral Lower Leg: Triamcinolone Acetonide Ointment (TCA) Primary Wound Dressing: Wound #1 Right,Anterior Lower Leg: Silver Alginate Wound #2 Left,Anterior Lower Leg: Silver Alginate Wound #3 Left,Lateral Lower Leg: Silver Alginate Secondary Dressing: Wound #1 Right,Anterior Lower Leg: ABD pad Kerlix and Coban Wound #2 Left,Anterior Lower Leg: ABD pad Kerlix and Coban Wound #3 Left,Lateral Lower Leg: ABD pad Kerlix and Coban Dressing Change FrequencyZAYVION, STAILEY. ([id number]) Wound #1 Right,Anterior Lower Leg: Change dressing every week Wound #2 Left,Anterior Lower Leg: Change dressing every week Wound #3 Left,Lateral Lower Leg: Change dressing every week Follow-up Appointments: Wound #1 Right,Anterior Lower Leg: Return Appointment in 1 week. Wound #2 Left,Anterior Lower Leg: Return Appointment in 1 week. Wound #3 Left,Lateral Lower Leg: Return Appointment in 1 week. Edema Control: Wound #1 Right,Anterior Lower Leg: Kerlix and Coban - Bilateral Elevate legs to the level of the heart and pump ankles as often as possible Wound #2 Left,Anterior Lower Leg: Kerlix and Coban - Bilateral Elevate legs to the level of the heart and pump ankles as often as possible Wound #3 Left,Lateral Lower Leg: Kerlix and Coban - Bilateral Elevate legs to the level of the heart and pump ankles as often as possible Consults ordered were: Vascular - Bilateral ABIs TBIs 1. Very small wounds bilaterally as described. Looking at his legs it looks like he has chronic venous insufficiency however he probably also has significant PAD. The situation in his legs is likely a combination of both factors 2. We applied silver alginate to his wounds and put him in a kerlix Coban wrap. I asked him to keep his legs elevated. 3. He will need formal arterial studies and we will arrange this 4. The only wound with any surface areas on his left  lateral calf. This has some debris in the center of it although I elected not to debride this as he would likely just weep edema fluid. I spent 30 minutes in review of this patient's past medical records, face-to-face evaluation and preparation of this record Electronic Signature(s) Signed: 03/04/2020 4:21:14 PM By: Linton Ham MD Entered By: Linton Ham on 03/04/2020 14:43:30 Dosch, [name] ([id number]) -------------------------------------------------------------------------------- ROS/PFSH Details Patient Name: [name] Mole B. Date of Service: 03/04/2020 1:00 PM Medical Record Number: [id number] Patient Account Number: [id number] Date of Birth/Sex: October 19, 1932 (86 y.o. M) Treating RN: Montey Hora Primary Care Provider: Lavera Guise Other Clinician: Referring Provider: Lavera Guise Treating Provider/Extender: Tito Dine in Treatment: 0 Constitutional Symptoms (General Health) Complaints and Symptoms: Negative for: Fatigue;  Fever; Chills; Marked Weight Change Eyes Complaints and Symptoms: Negative for: Dry Eyes; Vision Changes; Glasses / Contacts Ear/Nose/Mouth/Throat Complaints and Symptoms: Negative for: Difficult clearing ears; Sinusitis Hematologic/Lymphatic Complaints and Symptoms: Negative for: Bleeding / Clotting Disorders; Human Immunodeficiency Virus Respiratory Complaints and Symptoms: Negative for: Chronic or frequent coughs; Shortness of Breath Cardiovascular Complaints and Symptoms: Positive for: LE edema Negative for: Chest pain Medical History: Positive for: Hypertension Past Medical History Notes: HLD Gastrointestinal Complaints and Symptoms: Negative for: Frequent diarrhea; Nausea; Vomiting Endocrine Complaints and Symptoms: Negative for: Hepatitis; Thyroid disease; Polydypsia (Excessive Thirst) Genitourinary Complaints and Symptoms: Positive for: Kidney failure/ Dialysis - CKD stage 3 Negative for: Incontinence/dribbling Medical  History: Positive for: End Stage Renal Disease - CKD stage 3 Immunological Complaints and Symptoms: Negative for: Hives; Itching SAVOY, SOMERVILLE ([id number]) Integumentary (Skin) Complaints and Symptoms: Positive for: Wounds Negative for: Bleeding or bruising tendency; Breakdown; Swelling Medical History: Negative for: History of Burn; History of pressure wounds Musculoskeletal Complaints and Symptoms: Negative for: Muscle Pain; Muscle Weakness Medical History: Positive for: Osteoarthritis Negative for: Gout; Rheumatoid Arthritis; Osteomyelitis Neurologic Complaints and Symptoms: Negative for: Numbness/parasthesias; Focal/Weakness Medical History: Positive for: Seizure Disorder - no medication Negative for: Dementia; Neuropathy; Quadriplegia; Paraplegia Past Medical History Notes: hx CVA in 2011 Psychiatric Complaints and Symptoms: Negative for: Anxiety; Claustrophobia Oncologic Immunizations Pneumococcal Vaccine: Received Pneumococcal Vaccination: Yes Implantable Devices None Family and Social History Cancer: No; Diabetes: Yes - Siblings; Heart Disease: Yes - Siblings; Hereditary Spherocytosis: No; Hypertension: Yes - Siblings; Kidney Disease: No; Lung Disease: No; Seizures: No; Stroke: No; Thyroid Problems: No; Tuberculosis: No; Never smoker; Marital Status - Married; Alcohol Use: Never; Drug Use: No History; Caffeine Use: Daily; Financial Concerns: No; Food, Clothing or Shelter Needs: No; Support System Lacking: No; Transportation Concerns: No Electronic Signature(s) Signed: 03/04/2020 4:06:41 PM By: Montey Hora Signed: 03/04/2020 4:21:14 PM By: Linton Ham MD Entered By: Montey Hora on 03/04/2020 13:13:21 Wildermuth, [name] ([id number]) -------------------------------------------------------------------------------- [place] Details Patient Name: MATTEUS, MCNELLY B. Date of Service: 03/04/2020 Medical Record Number: [id number] Patient Account Number:  [id number] Date of Birth/Sex: 1932/03/15 (86 y.o. M) Treating RN: [name] Primary Care Provider: Lavera Guise Other Clinician: Referring Provider: Lavera Guise Treating Provider/Extender: Tito Dine in Treatment: 0 Diagnosis Coding ICD-10 Codes Code Description [phone] Chronic venous hypertension (idiopathic) with ulcer and inflammation of bilateral lower extremity L97.821 Non-pressure chronic ulcer of other part of left lower leg limited to breakdown of skin L97.811 Non-pressure chronic ulcer of other part of right lower leg limited to breakdown of skin I70.213 Atherosclerosis of native arteries of extremities with intermittent claudication, bilateral legs Physician Procedures CPT4 Code Description: [id number] WC PHYS LEVEL 3 o NEW PT Modifier: Quantity: 1 CPT4 Code Description: ICD-10 Diagnosis Description I87.333 Chronic venous hypertension (idiopathic) with ulcer and inflammation of bilateral L97.821 Non-pressure chronic ulcer of other part of left lower leg limited to breakdown of L97.811 Non-pressure  chronic ulcer of other part of right lower leg limited to breakdown o I70.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

## 2020-03-04 NOTE — Progress Notes (Signed)
[name], [name] ([id number]) Visit Report for 03/04/2020 Abuse/Suicide Risk Screen Details Patient Name: [name], [name]. Date of Service: 03/04/2020 1:00 PM Medical Record Number: [id number] Patient Account Number: [id number] Date of Birth/Sex: [birth year] (86 y.o. M) Treating RN: Montey Hora Primary Care Yaroslav Gombos: Lavera Guise Other Clinician: Referring Marita Burnsed: Lavera Guise Treating Trayton Szabo/Extender: Tito Dine in Treatment: 0 Abuse/Suicide Risk Screen Items Answer ABUSE RISK SCREEN: Has anyone close to you tried to hurt or harm you recentlyo No Do you feel uncomfortable with anyone in your familyo No Has anyone forced you do things that you didnot want to doo No Electronic Signature(s) Signed: 03/04/2020 4:06:41 PM By: Montey Hora Entered By: Montey Hora on 03/04/2020 13:07:29 [name], [name] ([id number]) -------------------------------------------------------------------------------- Activities of Daily Living Details Patient Name: [name], [name] Date of Service: 03/04/2020 1:00 PM Medical Record Number: [id number] Patient Account Number: [id number] Date of Birth/Sex: 13-Jun-1932 (86 y.o. M) Treating RN: Montey Hora Primary Care Dia Donate: Lavera Guise Other Clinician: Referring Elijio Staples: Lavera Guise Treating Barbee Mamula/Extender: Tito Dine in Treatment: 0 Activities of Daily Living Items Answer Activities of Daily Living (Please select one for each item) Drive Automobile Completely [name] Take Medications Completely [name] Use Telephone Completely [name] Care for Appearance Completely [name] Use Toilet Completely [name] Bath / Shower Completely [name] Dress Self Completely [name] Feed Self Completely [name] Walk Completely [name] Get In / Out Bed Completely [name] Housework Completely [name] Prepare Meals Completely [name] Electronic Signature(s) Signed: 03/04/2020 4:06:41 PM By: Montey Hora Entered  By: Montey Hora on 03/04/2020 13:08:22 [name], [name] ([id number]) -------------------------------------------------------------------------------- Education Screening Details Patient Name: [name] Mole B. Date of Service: 03/04/2020 1:00 PM Medical Record Number: [id number] Patient Account Number: [id number] Date of Birth/Sex: 1932/03/12 (86 y.o. M) Treating RN: Montey Hora Primary Care Tiawana Forgy: Lavera Guise Other Clinician: Referring Wren Gallaga: Lavera Guise Treating Marvine Encalade/Extender: Tito Dine in Treatment: 0 Primary Learner Assessed: Patient Learning Preferences/Education Level/Primary Language Learning Preference: Explanation, Demonstration Highest Education Level: High School Preferred Language: English Cognitive Barrier Language Barrier: No Translator Needed: No Memory Deficit: No Emotional Barrier: No Cultural/Religious Beliefs Affecting Medical Care: No Physical Barrier Impaired Vision: No Impaired Hearing: No Decreased Hand dexterity: No Knowledge/Comprehension Knowledge Level: Medium Comprehension Level: Medium Ability to understand written instructions: Medium Ability to understand verbal instructions: Medium Motivation Anxiety Level: Calm Cooperation: Cooperative Education Importance: Acknowledges Need Interest in Health Problems: Asks Questions Perception: Coherent Willingness to Engage in Self-Management Medium Activities: Readiness to Engage in Self-Management Medium Activities: Electronic Signature(s) Signed: 03/04/2020 4:06:41 PM By: Montey Hora Entered By: Montey Hora on 03/04/2020 13:08:45 [name], [name] ([id number]) -------------------------------------------------------------------------------- Fall Risk Assessment Details Patient Name: [name] Mole B. Date of Service: 03/04/2020 1:00 PM Medical Record Number: [id number] Patient Account Number: [id number] Date of Birth/Sex: 27-Feb-1932 (86 y.o. M) Treating RN: Montey Hora Primary Care Viana Sleep: Lavera Guise Other Clinician: Referring Vanilla Heatherington: Lavera Guise Treating Halia Franey/Extender: Tito Dine in Treatment: 0 Fall Risk Assessment Items Have you had 2 or more falls in the last 12 monthso 0 No Have you had any fall that resulted in injury in the last 12 monthso 0 No FALLS RISK SCREEN History of falling - immediate or within 3 months 0 No Secondary diagnosis (Do you have 2 or more medical diagnoseso) 0 No Ambulatory aid None/bed rest/wheelchair/nurse 0 Yes Crutches/cane/walker 0 No Furniture 0 No Intravenous therapy Access/Saline/Heparin Lock 0 No Gait/Transferring Normal/ bed rest/ wheelchair 0 Yes Weak (short steps  with or without shuffle, stooped but [name] to lift head while walking, may seek 0 No support from furniture) Impaired (short steps with shuffle, may have difficulty arising from chair, head down, impaired 0 No balance) Mental Status Oriented to own ability 0 Yes Electronic Signature(s) Signed: 03/04/2020 4:06:41 PM By: Montey Hora Entered By: Montey Hora on 03/04/2020 13:08:56 [name], [name] ([id number]) -------------------------------------------------------------------------------- Foot Assessment Details Patient Name: [name] Mole B. Date of Service: 03/04/2020 1:00 PM Medical Record Number: [id number] Patient Account Number: [id number] Date of Birth/Sex: [birth year] (86 y.o. M) Treating RN: Montey Hora Primary Care Jaryn Rosko: Lavera Guise Other Clinician: Referring Kendrick Haapala: Lavera Guise Treating Mikiah Demond/Extender: Tito Dine in Treatment: 0 Foot Assessment Items Site Locations + = Sensation present, - = Sensation absent, C = Callus, U = Ulcer R = Redness, W = Warmth, M = Maceration, PU = Pre-ulcerative lesion F = Fissure, S = Swelling, D = Dryness Assessment Right: Left: Other Deformity: No No Prior Foot Ulcer: No No Prior Amputation: No No Charcot Joint: No No Ambulatory Status:  Ambulatory Without Help Gait: Steady Electronic Signature(s) Signed: 03/04/2020 4:06:41 PM By: Montey Hora Entered By: Montey Hora on 03/04/2020 13:09:14 [name], [name] ([id number]) -------------------------------------------------------------------------------- Nutrition Risk Screening Details Patient Name: [name], [name] Date of Service: 03/04/2020 1:00 PM Medical Record Number: [id number] Patient Account Number: [id number] Date of Birth/Sex: May 02, 1932 (86 y.o. M) Treating RN: Montey Hora Primary Care Efstathios Sawin: Lavera Guise Other Clinician: Referring Markan Cazarez: Lavera Guise Treating Cleatis Fandrich/Extender: Tito Dine in Treatment: 0 Height (in): 69 Weight (lbs): 173 Body Mass Index (BMI): 25.5 Nutrition Risk Screening Items Score Screening NUTRITION RISK SCREEN: I have an illness or condition that made me change the kind and/or amount of food I eat 0 No I eat fewer than two meals per day 0 No I eat few fruits and vegetables, or milk products 0 No I have three or more drinks of beer, liquor or wine almost every day 0 No I have tooth or mouth problems that make it hard for me to eat 0 No I don't always have enough money to buy the food I need 0 No I eat alone most of the time 0 No I take three or more different prescribed or over-the-counter drugs a day 1 Yes Without wanting to, I have lost or gained 10 pounds in the last six months 0 No I am not always physically [name] to shop, cook and/or feed myself 0 No Nutrition Protocols Good Risk Protocol 0 No interventions needed Moderate Risk Protocol High Risk Proctocol Risk Level: Good Risk Score: 1 Electronic Signature(s) Signed: 03/04/2020 4:06:41 PM By: Montey Hora Entered By: Montey Hora on 03/04/2020 13:09:05

## 2020-03-04 NOTE — Progress Notes (Signed)
[name] ([id number]) Visit Report for 03/04/2020 Allergy List Details Patient Name: [name], [name]. Date of Service: 03/04/2020 1:00 PM Medical Record Number: [id number] Patient Account Number: [id number] Date of Birth/Sex: 1932/06/10 (86 y.o. M) Treating RN: Montey Hora Primary Care Annalie Wenner: Lavera Guise Other Clinician: Referring Jerusalen Mateja: Lavera Guise Treating Bryah Ocheltree/Extender: Ricard Dillon Weeks in Treatment: 0 Allergies Active Allergies morphine Reaction: hives Allergy Notes Electronic Signature(s) Signed: 03/04/2020 4:06:41 PM By: Montey Hora Entered By: Montey Hora on 03/04/2020 13:06:41 Okubo, [name] ([id number]) -------------------------------------------------------------------------------- Arrival Information Details Patient Name: [name], [name] Date of Service: 03/04/2020 1:00 PM Medical Record Number: [id number] Patient Account Number: [id number] Date of Birth/Sex: 29-Oct-1932 (86 y.o. M) Treating RN: [name] Primary Care Zema Lizardo: Lavera Guise Other Clinician: Referring Billye Nydam: Lavera Guise Treating Shannelle Alguire/Extender: Tito Dine in Treatment: 0 Visit Information Patient Arrived: Ambulatory Arrival Time: 12:59 Accompanied By: wife Transfer Assistance: None Patient Identification Verified: Yes Secondary Verification Process Completed: Yes Patient Has Alerts: Yes Patient Alerts: Patient on Blood Thinner Aspirin 325 Electronic Signature(s) Signed: 03/04/2020 1:42:08 PM By: Montey Hora Entered By: Montey Hora on 03/04/2020 13:42:08 Burrous, [name] ([id number]) -------------------------------------------------------------------------------- Clinic Level of Care Assessment Details Patient Name: [name] Mole B. Date of Service: 03/04/2020 1:00 PM Medical Record Number: [id number] Patient Account Number: [id number] Date of Birth/Sex: [birth year] (86 y.o. M) Treating RN: [name] Primary Care Vikram Tillett: Lavera Guise Other  Clinician: Referring Neelah Mannings: Lavera Guise Treating Maxen Rowland/Extender: Tito Dine in Treatment: 0 Clinic Level of Care Assessment Items TOOL 2 Quantity Score []  - Use when only an EandM is performed on the INITIAL visit 0 ASSESSMENTS - Nursing Assessment / Reassessment X - General Physical Exam (combine w/ comprehensive assessment (listed just below) when performed on new pt. 1 20 evals) X- 1 25 Comprehensive Assessment (HX, ROS, Risk Assessments, Wounds Hx, etc.) ASSESSMENTS - Wound and Skin Assessment / Reassessment []  - Simple Wound Assessment / Reassessment - one wound 0 X- 3 5 Complex Wound Assessment / Reassessment - multiple wounds []  - 0 Dermatologic / Skin Assessment (not related to wound area) ASSESSMENTS - Ostomy and/or Continence Assessment and Care []  - Incontinence Assessment and Management 0 []  - 0 Ostomy Care Assessment and Management (repouching, etc.) PROCESS - Coordination of Care X - Simple Patient / Family Education for ongoing care 1 15 []  - 0 Complex (extensive) Patient / Family Education for ongoing care []  - 0 Staff obtains Programmer, systems, Records, Test Results / Process Orders []  - 0 Staff telephones HHA, Nursing Homes / Clarify orders / etc []  - 0 Routine Transfer to another Facility (non-emergent condition) []  - 0 Routine Hospital Admission (non-emergent condition) X- 1 15 New Admissions / Biomedical engineer / Ordering NPWT, Apligraf, etc. []  - 0 Emergency Hospital Admission (emergent condition) X- 1 10 Simple Discharge Coordination []  - 0 Complex (extensive) Discharge Coordination PROCESS - Special Needs []  - Pediatric / Minor Patient Management 0 []  - 0 Isolation Patient Management []  - 0 Hearing / Language / Visual special needs []  - 0 Assessment of Community assistance (transportation, D/C planning, etc.) []  - 0 Additional assistance / Altered mentation []  - 0 Support Surface(s) Assessment (bed, cushion, seat,  etc.) INTERVENTIONS - Wound Cleansing / Measurement X - Wound Imaging (photographs - any number of wounds) 1 5 Ciolek, Augusta B. ([id number]) []  - 0 Wound Tracing (instead of photographs) []  - 0 Simple Wound Measurement - one wound X- 3 5 Complex Wound Measurement - multiple wounds []  - 0  Simple Wound Cleansing - one wound X- 3 5 Complex Wound Cleansing - multiple wounds INTERVENTIONS - Wound Dressings []  - Small Wound Dressing one or multiple wounds 0 X- 3 15 Medium Wound Dressing one or multiple wounds []  - 0 Large Wound Dressing one or multiple wounds []  - 0 Application of Medications - injection INTERVENTIONS - Miscellaneous []  - External ear exam 0 []  - 0 Specimen Collection (cultures, biopsies, blood, body fluids, etc.) []  - 0 Specimen(s) / Culture(s) sent or taken to Lab for analysis []  - 0 Patient Transfer (multiple staff / Civil Service fast streamer / Similar devices) []  - 0 Simple Staple / Suture removal (25 or less) []  - 0 Complex Staple / Suture removal (26 or more) []  - 0 Hypo / Hyperglycemic Management (close monitor of Blood Glucose) []  - 0 Ankle / Brachial Index (ABI) - do not check if billed separately Has the patient been seen at the hospital within the last three years: Yes Total Score: 180 Level Of Care: New/Established - Level 5 Electronic Signature(s) Signed: 03/04/2020 3:53:36 PM By: [name] Entered By: [name] on 03/04/2020 13:50:59 Hindle, [name] ([id number]) -------------------------------------------------------------------------------- Encounter Discharge Information Details Patient Name: [name] Mole B. Date of Service: 03/04/2020 1:00 PM Medical Record Number: [id number] Patient Account Number: [id number] Date of Birth/Sex: February 16, 1932 (86 y.o. M) Treating RN: [name] Primary Care [name]: Lavera Guise Other Clinician: Referring Azaryah Heathcock: Lavera Guise Treating Payne Garske/Extender: Tito Dine in Treatment: 0 Encounter Discharge  Information Items Discharge Condition: Stable Ambulatory Status: Ambulatory Discharge Destination: Home Transportation: Private Auto Accompanied By: wife Schedule Follow-up Appointment: Yes Clinical Summary of Care: Electronic Signature(s) Signed: 03/04/2020 3:53:36 PM By: [name] Entered By: [name] on 03/04/2020 13:51:46 Cordial, [name] ([id number]) -------------------------------------------------------------------------------- Lower Extremity Assessment Details Patient Name: [name] Mole B. Date of Service: 03/04/2020 1:00 PM Medical Record Number: [id number] Patient Account Number: [id number] Date of Birth/Sex: [birth year] (86 y.o. M) Treating RN: Montey Hora Primary Care Lela Murfin: Lavera Guise Other Clinician: Referring Tiyon Sanor: Lavera Guise Treating Louvinia Cumbo/Extender: Tito Dine in Treatment: 0 Edema Assessment Assessed: [Left: No] [Right: No] Edema: [Left: Yes] [Right: Yes] Calf Left: Right: Point of Measurement: 32 cm From Medial Instep 39 cm 38 cm Ankle Left: Right: Point of Measurement: 10 cm From Medial Instep 28.5 cm 28.5 cm Vascular Assessment Pulses: Dorsalis Pedis Palpable: [Left:No] [Right:No] Doppler Audible: [Left:Inaudible] [Right:Yes] Posterior Tibial Palpable: [Left:No Inaudible] [Right:No Yes] Notes No ABIs r/t inaudible pulses and the pulses on the right disappear when cuff is infated Electronic Signature(s) Signed: 03/04/2020 4:06:41 PM By: Montey Hora Entered By: Montey Hora on 03/04/2020 13:36:38 Chien, [name] ([id number]) -------------------------------------------------------------------------------- Multi Wound Chart Details Patient Name: [name] Mole B. Date of Service: 03/04/2020 1:00 PM Medical Record Number: [id number] Patient Account Number: [id number] Date of Birth/Sex: Feb 22, 1932 (86 y.o. M) Treating RN: [name] Primary Care Micaiah Remillard: Lavera Guise Other Clinician: Referring Kadiatou Oplinger: Lavera Guise Treating Vanesa Renier/Extender: Tito Dine in Treatment: 0 Vital Signs Height(in): 69 Pulse(bpm): 60 Weight(lbs): 173 Blood Pressure(mmHg): 163/69 Body Mass Index(BMI): 26 Temperature(F): 97.7 Respiratory Rate(breaths/min): 18 Photos: Wound Location: Right, Anterior Lower Leg Left, Anterior Lower Leg Left, Lateral Lower Leg Wounding Event: Gradually Appeared Gradually Appeared Gradually Appeared Primary Etiology: Venous Leg Ulcer Venous Leg Ulcer Venous Leg Ulcer Comorbid History: Hypertension, End Stage Renal Hypertension, End Stage Renal Hypertension, End Stage Renal Disease, Osteoarthritis, Seizure Disease, Osteoarthritis, Seizure Disease, Osteoarthritis, Seizure Disorder Disorder Disorder Date Acquired: 02/19/2020 02/19/2020 02/19/2020 Weeks of Treatment: 0 0 0 Wound  Status: Open Open Open Clustered Wound: Yes No No Clustered Quantity: 3 N/A N/A Measurements L x W x D (cm) 1.5x1.5x0.1 0.4x0.2x0.1 1.5x0.5x0.1 Area (cm) : 1.767 0.063 0.589 Volume (cm) : 0.177 0.006 0.059 Classification: Full Thickness Without Exposed Full Thickness Without Exposed Full Thickness Without Exposed Support Structures Support Structures Support Structures Exudate Amount: Medium Medium Medium Exudate Type: Serous Serous Serous Exudate Color: amber amber amber Wound Margin: Flat and Intact Flat and Intact Flat and Intact Granulation Amount: Large (67-100%) Medium (34-66%) Small (1-33%) Granulation Quality: Pink Pink Pink Necrotic Amount: Small (1-33%) Medium (34-66%) Large (67-100%) Exposed Structures: Fat Layer (Subcutaneous Tissue) Fat Layer (Subcutaneous Tissue) Fat Layer (Subcutaneous Tissue) Exposed: Yes Exposed: Yes Exposed: Yes Fascia: No Fascia: No Fascia: No Tendon: No Tendon: No Tendon: No Muscle: No Muscle: No Muscle: No Joint: No Joint: No Joint: No Bone: No Bone: No Bone: No Epithelialization: None None None Treatment Notes Wound #1 (Right, Anterior Lower  Leg) Notes scell, ABD, k/c Heninger, [place] B. ([id number]) Wound #2 (Left, Anterior Lower Leg) Notes scell, ABD, k/c Wound #3 (Left, Lateral Lower Leg) Notes scell, ABD, k/c Electronic Signature(s) Signed: 03/04/2020 4:21:14 PM By: Linton Ham MD Entered By: Linton Ham on 03/04/2020 14:29:39 Willaims, [name] ([id number]) -------------------------------------------------------------------------------- Multi-Disciplinary Care Plan Details Patient Name: EBRAHIM, DEREMER B. Date of Service: 03/04/2020 1:00 PM Medical Record Number: [id number] Patient Account Number: [id number] Date of Birth/Sex: 04-Apr-1932 (86 y.o. M) Treating RN: [name] Primary Care Londynn Sonoda: Lavera Guise Other Clinician: Referring Berenize Gatlin: Lavera Guise Treating Bobbijo Holst/Extender: Tito Dine in Treatment: 0 Active Inactive Orientation to the Wound Care Program Nursing Diagnoses: Knowledge deficit related to the wound healing center program Goals: Patient/caregiver will verbalize understanding of the [place] Program Date Initiated: 03/04/2020 Target Resolution Date: 04/11/2020 Goal Status: Active Interventions: Provide education on orientation to the wound center Notes: Venous Leg Ulcer Nursing Diagnoses: Knowledge deficit related to disease process and management Goals: Patient/caregiver will verbalize understanding of disease process and disease management Date Initiated: 03/04/2020 Target Resolution Date: 04/11/2020 Goal Status: Active Interventions: Assess peripheral edema status every visit. Notes: Wound/Skin Impairment Nursing Diagnoses: Impaired tissue integrity Goals: Ulcer/skin breakdown will have a volume reduction of 30% by week 4 Date Initiated: 03/04/2020 Target Resolution Date: 03/31/2020 Goal Status: Active Interventions: Assess ulceration(s) every visit Notes: Electronic Signature(s) Signed: 03/04/2020 3:53:36 PM By: [name] Entered By: [name] on  03/04/2020 13:43:04 Bonet, [name] ([id number]) Wieting, [name] ([id number]) -------------------------------------------------------------------------------- Pain Assessment Details Patient Name: [name] Mole B. Date of Service: 03/04/2020 1:00 PM Medical Record Number: [id number] Patient Account Number: [id number] Date of Birth/Sex: [birth year] (86 y.o. M) Treating RN: [name] Primary Care Everline Mahaffy: Lavera Guise Other Clinician: Referring Talina Pleitez: Lavera Guise Treating Zyon Grout/Extender: Tito Dine in Treatment: 0 Active Problems Location of Pain Severity and Description of Pain Patient Has Paino No Site Locations Rate the pain. Current Pain Level: 0 Pain Management and Medication Current Pain Management: Electronic Signature(s) Signed: 03/04/2020 3:53:36 PM By: [name] Signed: 03/04/2020 4:06:52 PM By: Lorine Bears RCP, RRT, CHT Entered By: Lorine Bears on 03/04/2020 13:00:45 Pollinger, [name] ([id number]) -------------------------------------------------------------------------------- Patient/Caregiver Education Details Patient Name: [name]. Date of Service: 03/04/2020 1:00 PM Medical Record Number: [id number] Patient Account Number: [id number] Date of Birth/Gender: 1932-06-24 (86 y.o. M) Treating RN: [name] Primary Care Physician: Lavera Guise Other Clinician: Referring Physician: Lavera Guise Treating Physician/Extender: Tito Dine in Treatment: 0 Education Assessment Education Provided To: Patient Education Topics  Provided Wound/Skin Impairment: Handouts: Caring for Your Ulcer Methods: Demonstration, Explain/Verbal Responses: State content correctly Electronic Signature(s) Signed: 03/04/2020 3:53:36 PM By: [name] Entered By: [name] on 03/04/2020 13:51:11 Mulvane, [name] ([id number]) -------------------------------------------------------------------------------- Wound Assessment  Details Patient Name: Fonseca, Dhruv B. Date of Service: 03/04/2020 1:00 PM Medical Record Number: [id number] Patient Account Number: [id number] Date of Birth/Sex: December 28, 1931 (86 y.o. M) Treating RN: Montey Hora Primary Care Orlando Thalmann: Lavera Guise Other Clinician: Referring Coleman Kalas: Lavera Guise Treating Verdia Bolt/Extender: Tito Dine in Treatment: 0 Wound Status Wound Number: 1 Primary Venous Leg Ulcer Etiology: Wound Location: Right, Anterior Lower Leg Wound Status: Open Wounding Event: Gradually Appeared Comorbid Hypertension, End Stage Renal Disease, Osteoarthritis, Date Acquired: 02/19/2020 History: Seizure Disorder Weeks Of Treatment: 0 Clustered Wound: Yes Photos Wound Measurements Length: (cm) 1.5 Width: (cm) 1.5 Depth: (cm) 0.1 Clustered Quantity: 3 Area: (cm) 1.767 Volume: (cm) 0.177 % Reduction in Area: % Reduction in Volume: Epithelialization: None Tunneling: No Undermining: No Wound Description Classification: Full Thickness Without Exposed Support Structures Wound Margin: Flat and Intact Exudate Amount: Medium Exudate Type: Serous Exudate Color: amber Foul Odor After Cleansing: No Slough/Fibrino Yes Wound Bed Granulation Amount: Large (67-100%) Exposed Structure Granulation Quality: Pink Fascia Exposed: No Necrotic Amount: Small (1-33%) Fat Layer (Subcutaneous Tissue) Exposed: Yes Necrotic Quality: Adherent Slough Tendon Exposed: No Muscle Exposed: No Joint Exposed: No Bone Exposed: No Treatment Notes Wound #1 (Right, Anterior Lower Leg) Notes scell, ABD, k/c Electronic Signature(s) AXZEL, ROCKHILL ([id number]) Signed: 03/04/2020 4:06:41 PM By: Montey Hora Entered By: Montey Hora on 03/04/2020 13:21:42 Mcree, [name] ([id number]) -------------------------------------------------------------------------------- Wound Assessment Details Patient Name: [name] Mole B. Date of Service: 03/04/2020 1:00 PM Medical Record Number:  [id number] Patient Account Number: [id number] Date of Birth/Sex: [birth year] (86 y.o. M) Treating RN: Montey Hora Primary Care Keric Zehren: Lavera Guise Other Clinician: Referring Fransheska Willingham: Lavera Guise Treating Tylia Ewell/Extender: Tito Dine in Treatment: 0 Wound Status Wound Number: 2 Primary Venous Leg Ulcer Etiology: Wound Location: Left, Anterior Lower Leg Wound Status: Open Wounding Event: Gradually Appeared Comorbid Hypertension, End Stage Renal Disease, Osteoarthritis, Date Acquired: 02/19/2020 History: Seizure Disorder Weeks Of Treatment: 0 Clustered Wound: No Photos Wound Measurements Length: (cm) 0.4 Width: (cm) 0.2 Depth: (cm) 0.1 Area: (cm) 0.063 Volume: (cm) 0.006 % Reduction in Area: % Reduction in Volume: Epithelialization: None Tunneling: No Undermining: No Wound Description Classification: Full Thickness Without Exposed Support Structures Wound Margin: Flat and Intact Exudate Amount: Medium Exudate Type: Serous Exudate Color: amber Foul Odor After Cleansing: No Slough/Fibrino Yes Wound Bed Granulation Amount: Medium (34-66%) Exposed Structure Granulation Quality: Pink Fascia Exposed: No Necrotic Amount: Medium (34-66%) Fat Layer (Subcutaneous Tissue) Exposed: Yes Necrotic Quality: Adherent Slough Tendon Exposed: No Muscle Exposed: No Joint Exposed: No Bone Exposed: No Treatment Notes Wound #2 (Left, Anterior Lower Leg) Notes scell, ABD, k/c Electronic Signature(s) Signed: 03/04/2020 4:06:41 PM By: Durene Fruits ([id number]) Entered By: Montey Hora on 03/04/2020 13:22:52 Lusty, [name] ([id number]) -------------------------------------------------------------------------------- Wound Assessment Details Patient Name: [name] Mole B. Date of Service: 03/04/2020 1:00 PM Medical Record Number: [id number] Patient Account Number: [id number] Date of Birth/Sex: 04-16-1932 (86 y.o. M) Treating RN: Montey Hora Primary  Care Antolin Belsito: Lavera Guise Other Clinician: Referring Edge Mauger: Lavera Guise Treating Kadijah Shamoon/Extender: Tito Dine in Treatment: 0 Wound Status Wound Number: 3 Primary Venous Leg Ulcer Etiology: Wound Location: Left, Lateral Lower Leg Wound Status: Open Wounding Event: Gradually Appeared Comorbid Hypertension, End Stage Renal Disease, Osteoarthritis, Date Acquired: 02/19/2020 History: Seizure  Disorder Weeks Of Treatment: 0 Clustered Wound: No Photos Wound Measurements Length: (cm) 1.5 Width: (cm) 0.5 Depth: (cm) 0.1 Area: (cm) 0.589 Volume: (cm) 0.059 % Reduction in Area: % Reduction in Volume: Epithelialization: None Tunneling: No Undermining: No Wound Description Classification: Full Thickness Without Exposed Support Structures Wound Margin: Flat and Intact Exudate Amount: Medium Exudate Type: Serous Exudate Color: amber Foul Odor After Cleansing: No Slough/Fibrino Yes Wound Bed Granulation Amount: Small (1-33%) Exposed Structure Granulation Quality: Pink Fascia Exposed: No Necrotic Amount: Large (67-100%) Fat Layer (Subcutaneous Tissue) Exposed: Yes Necrotic Quality: Adherent Slough Tendon Exposed: No Muscle Exposed: No Joint Exposed: No Bone Exposed: No Treatment Notes Wound #3 (Left, Lateral Lower Leg) Notes scell, ABD, k/c Electronic Signature(s) Signed: 03/04/2020 4:06:41 PM By: Durene Fruits ([id number]) Entered By: Montey Hora on 03/04/2020 13:23:57 Hyun, [name] ([id number]) -------------------------------------------------------------------------------- Vitals Details Patient Name: [name] Mole B. Date of Service: 03/04/2020 1:00 PM Medical Record Number: [id number] Patient Account Number: [id number] Date of Birth/Sex: 1932/10/01 (86 y.o. M) Treating RN: [name] Primary Care Tanette Chauca: Lavera Guise Other Clinician: Referring Shaquel Josephson: Lavera Guise Treating Fallou Hulbert/Extender: Tito Dine in  Treatment: 0 Vital Signs Time Taken: 13:00 Temperature (F): 97.7 Height (in): 69 Pulse (bpm): 60 Source: Stated Respiratory Rate (breaths/min): 18 Weight (lbs): 173 Blood Pressure (mmHg): 163/69 Source: Measured Reference Range: 80 - 120 mg / dl Body Mass Index (BMI): 25.5 Electronic Signature(s) Signed: 03/04/2020 4:06:52 PM By: Lorine Bears RCP, RRT, CHT Entered By: Lorine Bears on 03/04/2020 13:04:28

## 2020-03-08 ENCOUNTER — Other Ambulatory Visit (INDEPENDENT_AMBULATORY_CARE_PROVIDER_SITE_OTHER): Payer: Self-pay | Admitting: Internal Medicine

## 2020-03-08 DIAGNOSIS — L97821 Non-pressure chronic ulcer of other part of left lower leg limited to breakdown of skin: Secondary | ICD-10-CM

## 2020-03-08 DIAGNOSIS — I87333 Chronic venous hypertension (idiopathic) with ulcer and inflammation of bilateral lower extremity: Secondary | ICD-10-CM

## 2020-03-09 ENCOUNTER — Other Ambulatory Visit: Payer: Self-pay

## 2020-03-09 ENCOUNTER — Ambulatory Visit (INDEPENDENT_AMBULATORY_CARE_PROVIDER_SITE_OTHER): Payer: PPO

## 2020-03-09 DIAGNOSIS — L97919 Non-pressure chronic ulcer of unspecified part of right lower leg with unspecified severity: Secondary | ICD-10-CM | POA: Diagnosis not present

## 2020-03-09 DIAGNOSIS — L97929 Non-pressure chronic ulcer of unspecified part of left lower leg with unspecified severity: Secondary | ICD-10-CM

## 2020-03-09 DIAGNOSIS — L97821 Non-pressure chronic ulcer of other part of left lower leg limited to breakdown of skin: Secondary | ICD-10-CM

## 2020-03-09 DIAGNOSIS — I87333 Chronic venous hypertension (idiopathic) with ulcer and inflammation of bilateral lower extremity: Secondary | ICD-10-CM

## 2020-03-11 ENCOUNTER — Encounter: Payer: PPO | Admitting: Physician Assistant

## 2020-03-11 ENCOUNTER — Other Ambulatory Visit: Payer: Self-pay

## 2020-03-11 DIAGNOSIS — L97821 Non-pressure chronic ulcer of other part of left lower leg limited to breakdown of skin: Secondary | ICD-10-CM | POA: Diagnosis not present

## 2020-03-11 DIAGNOSIS — I70212 Atherosclerosis of native arteries of extremities with intermittent claudication, left leg: Secondary | ICD-10-CM | POA: Diagnosis not present

## 2020-03-11 DIAGNOSIS — I87312 Chronic venous hypertension (idiopathic) with ulcer of left lower extremity: Secondary | ICD-10-CM | POA: Diagnosis not present

## 2020-03-12 NOTE — Progress Notes (Signed)
[name] ([id number]) Visit Report for 03/11/2020 Arrival Information Details Patient Name: [name], [name]. Date of Service: 03/11/2020 1:30 PM Medical Record Number: [id number] Patient Account Number: [id number] Date of Birth/Sex: 1932-05-29 (86 y.o. M) Treating RN: [name] Primary Care Porsche Noguchi: Lavera Guise Other Clinician: Referring Rajveer Handler: Lavera Guise Treating Jessicca Stitzer/Extender: Melburn Hake, HOYT Weeks in Treatment: 1 Visit Information History Since Last Visit Added or deleted any medications: No Patient Arrived: Ambulatory Any new allergies or adverse reactions: No Arrival Time: 13:29 Had a fall or experienced change in No Accompanied By: wife activities of daily living that may affect Transfer Assistance: None risk of falls: Patient Identification Verified: Yes Signs or symptoms of abuse/neglect since last visito No Secondary Verification Process Completed: Yes Hospitalized since last visit: No Patient Has Alerts: Yes Implantable device outside of the clinic excluding No Patient Alerts: Patient on Blood Thinner cellular tissue based products placed in the center Aspirin 325 since last visit: Has Dressing in Place as Prescribed: Yes Has Compression in Place as Prescribed: Yes Pain Present Now: No Electronic Signature(s) Signed: 03/11/2020 4:37:37 PM By: Lorine Bears RCP, RRT, CHT Entered By: Lorine Bears on 03/11/2020 13:30:26 Alessio, [name] ([id number]) -------------------------------------------------------------------------------- Clinic Level of Care Assessment Details Patient Name: [name], [name] Date of Service: 03/11/2020 1:30 PM Medical Record Number: [id number] Patient Account Number: [id number] Date of Birth/Sex: 1932-03-25 (86 y.o. M) Treating RN: [name] Primary Care Dessire Grimes: Lavera Guise Other Clinician: Referring Shakeitha Umbaugh: Lavera Guise Treating Larina Lieurance/Extender: Melburn Hake, HOYT Weeks in Treatment: 1 Clinic  Level of Care Assessment Items TOOL 4 Quantity Score []  - Use when only an EandM is performed on FOLLOW-UP visit 0 ASSESSMENTS - Nursing Assessment / Reassessment X - Reassessment of Co-morbidities (includes updates in patient status) 1 10 X- 1 5 Reassessment of Adherence to Treatment Plan ASSESSMENTS - Wound and Skin Assessment / Reassessment []  - Simple Wound Assessment / Reassessment - one wound 0 X- 3 5 Complex Wound Assessment / Reassessment - multiple wounds []  - 0 Dermatologic / Skin Assessment (not related to wound area) ASSESSMENTS - Focused Assessment []  - Circumferential Edema Measurements - multi extremities 0 []  - 0 Nutritional Assessment / Counseling / Intervention []  - 0 Lower Extremity Assessment (monofilament, tuning fork, pulses) []  - 0 Peripheral Arterial Disease Assessment (using hand held doppler) ASSESSMENTS - Ostomy and/or Continence Assessment and Care []  - Incontinence Assessment and Management 0 []  - 0 Ostomy Care Assessment and Management (repouching, etc.) PROCESS - Coordination of Care X - Simple Patient / Family Education for ongoing care 1 15 []  - 0 Complex (extensive) Patient / Family Education for ongoing care []  - 0 Staff obtains Programmer, systems, Records, Test Results / Process Orders []  - 0 Staff telephones HHA, Nursing Homes / Clarify orders / etc []  - 0 Routine Transfer to another Facility (non-emergent condition) []  - 0 Routine Hospital Admission (non-emergent condition) []  - 0 New Admissions / Biomedical engineer / Ordering NPWT, Apligraf, etc. []  - 0 Emergency Hospital Admission (emergent condition) X- 1 10 Simple Discharge Coordination []  - 0 Complex (extensive) Discharge Coordination PROCESS - Special Needs []  - Pediatric / Minor Patient Management 0 []  - 0 Isolation Patient Management []  - 0 Hearing / Language / Visual special needs []  - 0 Assessment of Community assistance (transportation, D/C planning, etc.) []  -  0 Additional assistance / Altered mentation []  - 0 Support Surface(s) Assessment (bed, cushion, seat, etc.) INTERVENTIONS - Wound Cleansing / Measurement Lesinski, Elo B. ([id number]) X-  1 5 Simple Wound Cleansing - one wound []  - 0 Complex Wound Cleansing - multiple wounds X- 1 5 Wound Imaging (photographs - any number of wounds) []  - 0 Wound Tracing (instead of photographs) X- 1 5 Simple Wound Measurement - one wound []  - 0 Complex Wound Measurement - multiple wounds INTERVENTIONS - Wound Dressings []  - Small Wound Dressing one or multiple wounds 0 X- 1 15 Medium Wound Dressing one or multiple wounds []  - 0 Large Wound Dressing one or multiple wounds []  - 0 Application of Medications - topical []  - 0 Application of Medications - injection INTERVENTIONS - Miscellaneous []  - External ear exam 0 []  - 0 Specimen Collection (cultures, biopsies, blood, body fluids, etc.) []  - 0 Specimen(s) / Culture(s) sent or taken to Lab for analysis []  - 0 Patient Transfer (multiple staff / Civil Service fast streamer / Similar devices) []  - 0 Simple Staple / Suture removal (25 or less) []  - 0 Complex Staple / Suture removal (26 or more) []  - 0 Hypo / Hyperglycemic Management (close monitor of Blood Glucose) []  - 0 Ankle / Brachial Index (ABI) - do not check if billed separately X- 1 5 Vital Signs Has the patient been seen at the hospital within the last three years: Yes Total Score: 90 Level Of Care: New/Established - Level 3 Electronic Signature(s) Signed: 03/11/2020 4:32:24 PM By: [name] Entered By: [name] on 03/11/2020 13:59:31 Fraleigh, [name] ([id number]) -------------------------------------------------------------------------------- Encounter Discharge Information Details Patient Name: [name] Mole B. Date of Service: 03/11/2020 1:30 PM Medical Record Number: [id number] Patient Account Number: [id number] Date of Birth/Sex: 19-Jun-1932 (86 y.o. M) Treating RN: [name] Primary  Care Zaydyn Havey: Lavera Guise Other Clinician: Referring Margrett Kalb: Lavera Guise Treating Zoiey Christy/Extender: Melburn Hake, HOYT Weeks in Treatment: 1 Encounter Discharge Information Items Discharge Condition: Stable Ambulatory Status: Ambulatory Discharge Destination: Home Transportation: Other Accompanied By: wife Schedule Follow-up Appointment: Yes Clinical Summary of Care: Electronic Signature(s) Signed: 03/11/2020 4:32:24 PM By: [name] Entered By: [name] on 03/11/2020 14:00:20 Moriarty, [name] ([id number]) -------------------------------------------------------------------------------- Lower Extremity Assessment Details Patient Name: [name] Mole B. Date of Service: 03/11/2020 1:30 PM Medical Record Number: [id number] Patient Account Number: [id number] Date of Birth/Sex: 06-26-1932 (86 y.o. M) Treating RN: [name] Primary Care Reginold Beale: Lavera Guise Other Clinician: Referring Latravia Southgate: Lavera Guise Treating Wyatt Galvan/Extender: Melburn Hake, HOYT Weeks in Treatment: 1 Edema Assessment Assessed: [Left: No] [Right: No] Edema: [Left: No] [Right: No] Calf Left: Right: Point of Measurement: 32 cm From Medial Instep 36 cm 37 cm Ankle Left: Right: Point of Measurement: 10 cm From Medial Instep 28 cm 27.5 cm Vascular Assessment Pulses: Dorsalis Pedis Palpable: [Left:Yes] [Right:Yes] Blood Pressure: Brachial: [Left:130] [Right:132] Dorsalis Pedis: 156 [Left:Dorsalis Pedis: 009] Ankle: Posterior Tibial: 178 [Left:Posterior Tibial: 152 1.35] [Right:1.15] Notes ABIs from [facility] with TBI R .39 and L .49 Electronic Signature(s) Signed: 03/11/2020 1:52:42 PM By: Montey Hora Signed: 03/11/2020 4:32:24 PM By: [name] Entered By: Montey Hora on 03/11/2020 13:52:42 Goga, [name] ([id number]) -------------------------------------------------------------------------------- Multi Wound Chart Details Patient Name: [name] Mole B. Date of Service: 03/11/2020 1:30  PM Medical Record Number: [id number] Patient Account Number: [id number] Date of Birth/Sex: 12-01-1931 (86 y.o. M) Treating RN: [name] Primary Care Emrik Erhard: Lavera Guise Other Clinician: Referring Brenner Visconti: Lavera Guise Treating Zamariya Neal/Extender: Melburn Hake, HOYT Weeks in Treatment: 1 Vital Signs Height(in): 69 Pulse(bpm): 62 Weight(lbs): 173 Blood Pressure(mmHg): 151/73 Body Mass Index(BMI): 26 Temperature(F): 98.3 Respiratory Rate(breaths/min): 18 Photos: Wound Location: Right, Anterior Lower Leg Left, Anterior Lower Leg  Left, Lateral Lower Leg Wounding Event: Gradually Appeared Gradually Appeared Gradually Appeared Primary Etiology: Venous Leg Ulcer Venous Leg Ulcer Venous Leg Ulcer Comorbid History: Hypertension, End Stage Renal Hypertension, End Stage Renal Hypertension, End Stage Renal Disease, Osteoarthritis, Seizure Disease, Osteoarthritis, Seizure Disease, Osteoarthritis, Seizure Disorder Disorder Disorder Date Acquired: 02/19/2020 02/19/2020 02/19/2020 Weeks of Treatment: 1 1 1  Wound Status: Open Open Open Clustered Wound: Yes No No Clustered Quantity: 3 N/A N/A Measurements L x W x D (cm) 0.1x0.1x0.1 0x0x0 1.5x0.5x0.1 Area (cm) : 0.008 0 0.589 Volume (cm) : 0.001 0 0.059 % Reduction in Area: 99.50% 100.00% 0.00% % Reduction in Volume: 99.40% 100.00% 0.00% Classification: Full Thickness Without Exposed Full Thickness Without Exposed Full Thickness Without Exposed Support Structures Support Structures Support Structures Exudate Amount: Medium Medium Medium Exudate Type: Serous Serous Serous Exudate Color: amber amber amber Wound Margin: Flat and Intact Flat and Intact Flat and Intact Granulation Amount: Large (67-100%) Medium (34-66%) Small (1-33%) Granulation Quality: Pink Pink Pink Necrotic Amount: Small (1-33%) Medium (34-66%) Large (67-100%) Exposed Structures: Fat Layer (Subcutaneous Tissue) Fat Layer (Subcutaneous Tissue) Fat Layer (Subcutaneous  Tissue) Exposed: Yes Exposed: Yes Exposed: Yes Fascia: No Fascia: No Fascia: No Tendon: No Tendon: No Tendon: No Muscle: No Muscle: No Muscle: No Joint: No Joint: No Joint: No Bone: No Bone: No Bone: No Epithelialization: None None None Treatment Notes Electronic Signature(s) Signed: 03/11/2020 4:32:24 PM By: [name] Entered By: [name] on 03/11/2020 13:51:32 Mcclain, [name] ([id number]) [place], [name] ([id number]) -------------------------------------------------------------------------------- Multi-Disciplinary Care Plan Details Patient Name: NIKOLOS, BILLIG B. Date of Service: 03/11/2020 1:30 PM Medical Record Number: [id number] Patient Account Number: [id number] Date of Birth/Sex: Apr 02, 1932 (86 y.o. M) Treating RN: [name] Primary Care Alanson Hausmann: Lavera Guise Other Clinician: Referring Zaven Klemens: Lavera Guise Treating Baylee Campus/Extender: Melburn Hake, HOYT Weeks in Treatment: 1 Active Inactive Orientation to the Wound Care Program Nursing Diagnoses: Knowledge deficit related to the wound healing center program Goals: Patient/caregiver will verbalize understanding of the [place] Program Date Initiated: 03/04/2020 Target Resolution Date: 04/11/2020 Goal Status: Active Interventions: Provide education on orientation to the wound center Notes: Venous Leg Ulcer Nursing Diagnoses: Knowledge deficit related to disease process and management Goals: Patient/caregiver will verbalize understanding of disease process and disease management Date Initiated: 03/04/2020 Target Resolution Date: 04/11/2020 Goal Status: Active Interventions: Assess peripheral edema status every visit. Notes: Wound/Skin Impairment Nursing Diagnoses: Impaired tissue integrity Goals: Ulcer/skin breakdown will have a volume reduction of 30% by week 4 Date Initiated: 03/04/2020 Target Resolution Date: 03/31/2020 Goal Status: Active Interventions: Assess ulceration(s) every  visit Notes: Electronic Signature(s) Signed: 03/11/2020 4:32:24 PM By: [name] Entered By: [name] on 03/11/2020 13:51:25 Credit, [name] ([id number]) -------------------------------------------------------------------------------- Pain Assessment Details Patient Name: [name] Mole B. Date of Service: 03/11/2020 1:30 PM Medical Record Number: [id number] Patient Account Number: [id number] Date of Birth/Sex: Feb 08, 1932 (86 y.o. M) Treating RN: [name] Primary Care Altair Stanko: Lavera Guise Other Clinician: Referring Sasuke Yaffe: Lavera Guise Treating Floyed Masoud/Extender: Melburn Hake, HOYT Weeks in Treatment: 1 Active Problems Location of Pain Severity and Description of Pain Patient Has Paino No Site Locations Pain Management and Medication Current Pain Management: Electronic Signature(s) Signed: 03/11/2020 4:32:24 PM By: [name] Entered By: [name] on 03/11/2020 13:37:29 Gerstenberger, [name] ([id number]) -------------------------------------------------------------------------------- Patient/Caregiver Education Details Patient Name: [name] Mole B. Date of Service: 03/11/2020 1:30 PM Medical Record Number: [id number] Patient Account Number: [id number] Date of Birth/Gender: [birth year] (86 y.o. M) Treating RN: [name] Primary Care Physician: Lavera Guise Other Clinician:  Referring Physician: Lavera Guise Treating Physician/Extender: Sharalyn Ink in Treatment: 1 Education Assessment Education Provided To: Patient Education Topics Provided Wound/Skin Impairment: Handouts: Caring for Your Ulcer Methods: Demonstration, Explain/Verbal Responses: State content correctly Electronic Signature(s) Signed: 03/11/2020 4:32:24 PM By: [name] Entered By: [name] on 03/11/2020 13:59:43 Kintz, [name] ([id number]) -------------------------------------------------------------------------------- Wound Assessment Details Patient Name: [name] Mole B. Date of  Service: 03/11/2020 1:30 PM Medical Record Number: [id number] Patient Account Number: [id number] Date of Birth/Sex: July 14, 1932 (86 y.o. M) Treating RN: [name] Primary Care Jaymen Fetch: Lavera Guise Other Clinician: Referring Dalma Panchal: Lavera Guise Treating Mohmmad Saleeby/Extender: Melburn Hake, HOYT Weeks in Treatment: 1 Wound Status Wound Number: 1 Primary Venous Leg Ulcer Etiology: Wound Location: Right, Anterior Lower Leg Wound Status: Healed - Epithelialized Wounding Event: Gradually Appeared Comorbid Hypertension, End Stage Renal Disease, Osteoarthritis, Date Acquired: 02/19/2020 History: Seizure Disorder Weeks Of Treatment: 1 Clustered Wound: Yes Photos Wound Measurements Length: (cm) Width: (cm) Depth: (cm) Clustered Quantity: Area: (cm) Volume: (cm) 0 % Reduction in Area: 100% 0 % Reduction in Volume: 100% 0 Epithelialization: None 3 0 0 Wound Description Classification: Full Thickness Without Exposed Support Structu Wound Margin: Flat and Intact Exudate Amount: Medium Exudate Type: Serous Exudate Color: amber res Foul Odor After Cleansing: No Slough/Fibrino Yes Wound Bed Granulation Amount: Large (67-100%) Exposed Structure Granulation Quality: Pink Fascia Exposed: No Necrotic Amount: Small (1-33%) Fat Layer (Subcutaneous Tissue) Exposed: Yes Necrotic Quality: Adherent Slough Tendon Exposed: No Muscle Exposed: No Joint Exposed: No Bone Exposed: No Electronic Signature(s) Signed: 03/11/2020 4:32:24 PM By: [name] Entered By: [name] on 03/11/2020 13:55:23 Lal, [name] ([id number]) -------------------------------------------------------------------------------- Wound Assessment Details Patient Name: [name] Mole B. Date of Service: 03/11/2020 1:30 PM Medical Record Number: [id number] Patient Account Number: [id number] Date of Birth/Sex: 1932/09/05 (86 y.o. M) Treating RN: [name] Primary Care Shamyra Farias: Lavera Guise Other Clinician: Referring  Adrain Butrick: Lavera Guise Treating Ivelise Castillo/Extender: Melburn Hake, HOYT Weeks in Treatment: 1 Wound Status Wound Number: 2 Primary Venous Leg Ulcer Etiology: Wound Location: Left, Anterior Lower Leg Wound Status: Open Wounding Event: Gradually Appeared Comorbid Hypertension, End Stage Renal Disease, Osteoarthritis, Date Acquired: 02/19/2020 History: Seizure Disorder Weeks Of Treatment: 1 Clustered Wound: No Photos Wound Measurements Length: (cm) 0 Width: (cm) 0 Depth: (cm) 0 Area: (cm) Volume: (cm) % Reduction in Area: 100% % Reduction in Volume: 100% Epithelialization: None 0 0 Wound Description Classification: Full Thickness Without Exposed Support Structu Wound Margin: Flat and Intact Exudate Amount: Medium Exudate Type: Serous Exudate Color: amber res Foul Odor After Cleansing: No Slough/Fibrino Yes Wound Bed Granulation Amount: Medium (34-66%) Exposed Structure Granulation Quality: Pink Fascia Exposed: No Necrotic Amount: Medium (34-66%) Fat Layer (Subcutaneous Tissue) Exposed: Yes Necrotic Quality: Adherent Slough Tendon Exposed: No Muscle Exposed: No Joint Exposed: No Bone Exposed: No Electronic Signature(s) Signed: 03/11/2020 4:32:24 PM By: [name] Entered By: [name] on 03/11/2020 13:38:19 Carcamo, [name] ([id number]) -------------------------------------------------------------------------------- Wound Assessment Details Patient Name: [name] Mole B. Date of Service: 03/11/2020 1:30 PM Medical Record Number: [id number] Patient Account Number: [id number] Date of Birth/Sex: September 24, 1932 (86 y.o. M) Treating RN: [name] Primary Care Sasuke Yaffe: Lavera Guise Other Clinician: Referring Savanna Dooley: Lavera Guise Treating Ever Gustafson/Extender: Melburn Hake, HOYT Weeks in Treatment: 1 Wound Status Wound Number: 3 Primary Venous Leg Ulcer Etiology: Wound Location: Left, Lateral Lower Leg Wound Status: Open Wounding Event: Gradually Appeared Comorbid  Hypertension, End Stage Renal Disease, Osteoarthritis, Date Acquired: 02/19/2020 History: Seizure Disorder Weeks Of Treatment: 1 Clustered Wound: No Photos Wound Measurements  Length: (cm) 1.5 Width: (cm) 0.2 Depth: (cm) 0.1 Area: (cm) 0.236 Volume: (cm) 0.024 % Reduction in Area: 59.9% % Reduction in Volume: 59.3% Epithelialization: None Wound Description Classification: Full Thickness Without Exposed Support Structu Wound Margin: Flat and Intact Exudate Amount: Medium Exudate Type: Serous Exudate Color: amber res Foul Odor After Cleansing: No Slough/Fibrino Yes Wound Bed Granulation Amount: Small (1-33%) Exposed Structure Granulation Quality: Pink Fascia Exposed: No Necrotic Amount: Large (67-100%) Fat Layer (Subcutaneous Tissue) Exposed: Yes Necrotic Quality: Adherent Slough Tendon Exposed: No Muscle Exposed: No Joint Exposed: No Bone Exposed: No Treatment Notes Wound #3 (Left, Lateral Lower Leg) Notes scell, ABD, k/c Electronic Signature(s) Signed: 03/11/2020 4:32:24 PM By: Manuela Schwartz ([id number]) Entered By: [name] on 03/11/2020 13:55:24 Marcano, [name] ([id number]) -------------------------------------------------------------------------------- Vitals Details Patient Name: [name] Mole B. Date of Service: 03/11/2020 1:30 PM Medical Record Number: [id number] Patient Account Number: [id number] Date of Birth/Sex: 1932/09/17 (86 y.o. M) Treating RN: [name] Primary Care Constanza Mincy: Lavera Guise Other Clinician: Referring Tiyah Zelenak: Lavera Guise Treating Leyah Bocchino/Extender: Melburn Hake, HOYT Weeks in Treatment: 1 Vital Signs Time Taken: 13:30 Temperature (F): 98.3 Height (in): 69 Pulse (bpm): 62 Weight (lbs): 173 Respiratory Rate (breaths/min): 18 Body Mass Index (BMI): 25.5 Blood Pressure (mmHg): 151/73 Reference Range: 80 - 120 mg / dl Electronic Signature(s) Signed: 03/11/2020 4:37:37 PM By: Lorine Bears RCP, RRT,  CHT Entered By: Lorine Bears on 03/11/2020 13:31:02

## 2020-03-12 NOTE — Progress Notes (Signed)
TRUONG, DELCASTILLO ([id number]) Visit Report for 03/11/2020 Chief Complaint Document Details Patient Name: [name], [name]. Date of Service: 03/11/2020 1:30 PM Medical Record Number: [id number] Patient Account Number: [id number] Date of Birth/Sex: 23-Oct-1932 (86 y.o. M) Treating RN: [name] Primary Care Provider: Lavera Guise Other Clinician: Referring Provider: Lavera Guise Treating Provider/Extender: Melburn Hake, Nicoletta Hush Weeks in Treatment: 1 Information Obtained from: Patient Chief Complaint 03/04/2020; patient is here for review of bilateral small lower extremity wounds Electronic Signature(s) Signed: 03/11/2020 1:41:49 PM By: Worthy Keeler PA-C Entered By: Worthy Keeler on 03/11/2020 13:41:48 Asfaw, [name] -------------------------------------------------------------------------------- HPI Details Patient Name: [name] Mole B. Date of Service: 03/11/2020 1:30 PM Medical Record Number: [id number] Patient Account Number: [id number] Date of Birth/Sex: 1932/02/19 (86 y.o. M) Treating RN: [name] Primary Care Provider: Lavera Guise Other Clinician: Referring Provider: Lavera Guise Treating Provider/Extender: Melburn Hake, Miriam Kestler Weeks in Treatment: 1 History of Present Illness HPI Description: ADMISSION 03/04/2020 Patient is an 86 year old man who arrives accompanied by his wife. He states that he has had wounds on his bilateral lower extremities that started with increased swelling in his lower extremities. He has quite significant bilateral lower extremity pitting edema but he states that this is only been there for 2 or 3 weeks. Says he saw his primary doctor who put him on what sounds like a diuretic. He has developed small wounds on the left lateral which is actually the most prominent wound. Very small superficial wounds on the left anterior and a cluster of very small wounds on the right lateral. These are not particularly painful although he states that his legs are  itchy. on arrival in the clinic our intake nurse could not obtain pulses bilaterally. She could not maintain of enough of a Doppler in the right dorsalis pedis to get an ABI and on the left she could not even obtain it by Doppler. The patient does have pain in his legs when he walks. He says 10 to 15 minutes he has to sit down and rest although he thought that this was because of his knees. He also mentions that his wounds are painful at night when he is lying in bed sometimes waking him up at 5:00 in the morning. Past medical history includes hypertension, CVA in 2011 with no residual effects, osteoarthritis of both knees, recent seizure disorder but apparently complete work-up was negative, ITP, vitamin B12 deficiency and stage III chronic renal failure We could not obtain ABIs in our clinic as noted above 03/11/2020 upon evaluation today patient actually appears to be doing much better in regard to his bilateral lower extremities. Fortunately there is no signs of infection. He actually saw Dr. Dellia Nims during his initial visit last week and he was treated very appropriately his wounds appear to be doing much better. Also don't have the complete evaluation as far as his arterial study is concerned that the vascular doctor has not read it yet. With that being said it did appear that the patient's findings were consistent with being okay. His TBI's were somewhat low at 0.39 on the right and 0.49 on the left with an ABI of 1.15 on the right and 1.35 on the left. With that being said I think that the fact that he is healing and has tolerated the wrap very well at this point is a good indication that he is good to do just fine. Electronic Signature(s) Signed: 03/11/2020 5:48:39 PM By: Worthy Keeler PA-C Entered By: Worthy Keeler  on 03/11/2020 17:48:39 [name], [name] -------------------------------------------------------------------------------- Physical Exam Details Patient Name: [name],  [name] Date of Service: 03/11/2020 1:30 PM Medical Record Number: [id number] Patient Account Number: [id number] Date of Birth/Sex: 1932-05-06 (86 y.o. M) Treating RN: [name] Primary Care Provider: Lavera Guise Other Clinician: Referring Provider: Lavera Guise Treating Provider/Extender: STONE III, Cherril Hett Weeks in Treatment: 1 Constitutional Well-nourished and well-hydrated in no acute distress. Respiratory normal breathing without difficulty. Psychiatric this patient is able to make decisions and demonstrates good insight into disease process. Alert and Oriented x 3. pleasant and cooperative. Notes Upon inspection patient's wound bed actually showed signs of healing quite nicely. The 1 remaining ulcer in the left lateral lower extremity actually did show some slough noted I mechanically debrided this wound with saline gauze he tolerated that well and post debridement wound bed appears to be doing much better which is great news. There is no signs of active infection at this time which is also excellent news. Electronic Signature(s) Signed: 03/11/2020 5:50:04 PM By: Worthy Keeler PA-C Entered By: Worthy Keeler on 03/11/2020 17:50:04 [name], [name] -------------------------------------------------------------------------------- Physician Orders Details Patient Name: [name], [name] Date of Service: 03/11/2020 1:30 PM Medical Record Number: [id number] Patient Account Number: [id number] Date of Birth/Sex: June 08, 1932 (86 y.o. M) Treating RN: [name] Primary Care Provider: Lavera Guise Other Clinician: Referring Provider: Lavera Guise Treating Provider/Extender: Melburn Hake, Sheliah Fiorillo Weeks in Treatment: 1 Verbal / Phone Orders: No Diagnosis Coding ICD-10 Coding Code Description I87.333 Chronic venous hypertension (idiopathic) with ulcer and inflammation of bilateral lower extremity L97.821 Non-pressure chronic ulcer of other part of left lower leg limited to breakdown  of skin L97.811 Non-pressure chronic ulcer of other part of right lower leg limited to breakdown of skin I70.213 Atherosclerosis of native arteries of extremities with intermittent claudication, bilateral legs Wound Cleansing Wound #3 Left,Lateral Lower Leg o Clean wound with Normal Saline. Primary Wound Dressing Wound #3 Left,Lateral Lower Leg o Silver Alginate Secondary Dressing Wound #3 Left,Lateral Lower Leg o ABD pad o Kerlix and Coban Dressing Change Frequency Wound #3 Left,Lateral Lower Leg o Change dressing every week Follow-up Appointments Wound #3 Left,Lateral Lower Leg o Return Appointment in 1 week. Edema Control Wound #3 Left,Lateral Lower Leg o Kerlix and Coban - Bilateral o Elevate legs to the level of the heart and pump ankles as often as possible Electronic Signature(s) Signed: 03/11/2020 4:32:24 PM By: [name] Signed: 03/11/2020 5:56:59 PM By: Worthy Keeler PA-C Entered By: [name] on 03/11/2020 13:59:01 Buice, [name] ([id number]) -------------------------------------------------------------------------------- Problem List Details Patient Name: [name], [name] Date of Service: 03/11/2020 1:30 PM Medical Record Number: [id number] Patient Account Number: [id number] Date of Birth/Sex: Feb 11, 1932 (86 y.o. M) Treating RN: [name] Primary Care Provider: Lavera Guise Other Clinician: Referring Provider: Lavera Guise Treating Provider/Extender: Melburn Hake, Ylonda Storr Weeks in Treatment: 1 Active Problems ICD-10 Encounter Code Description Active Date MDM Diagnosis I87.333 Chronic venous hypertension (idiopathic) with ulcer and inflammation of 03/04/2020 No Yes bilateral lower extremity L97.821 Non-pressure chronic ulcer of other part of left lower leg limited to 03/04/2020 No Yes breakdown of skin L97.811 Non-pressure chronic ulcer of other part of right lower leg limited to 03/04/2020 No Yes breakdown of skin I70.213 Atherosclerosis of  native arteries of extremities with intermittent 03/04/2020 No Yes claudication, bilateral legs Inactive Problems Resolved Problems Electronic Signature(s) Signed: 03/11/2020 1:41:34 PM By: Worthy Keeler PA-C Entered By: Worthy Keeler on 03/11/2020 13:41:34 [place] ([id number]) -------------------------------------------------------------------------------- Progress  Note Details Patient Name: [name], [name] Date of Service: 03/11/2020 1:30 PM Medical Record Number: [id number] Patient Account Number: [id number] Date of Birth/Sex: [birth year] (86 y.o. M) Treating RN: [name] Primary Care Provider: Lavera Guise Other Clinician: Referring Provider: Lavera Guise Treating Provider/Extender: Melburn Hake, Taydem Cavagnaro Weeks in Treatment: 1 Subjective Chief Complaint Information obtained from Patient 03/04/2020; patient is here for review of bilateral small lower extremity wounds History of Present Illness (HPI) ADMISSION 03/04/2020 Patient is an 86 year old man who arrives accompanied by his wife. He states that he has had wounds on his bilateral lower extremities that started with increased swelling in his lower extremities. He has quite significant bilateral lower extremity pitting edema but he states that this is only been there for 2 or 3 weeks. Says he saw his primary doctor who put him on what sounds like a diuretic. He has developed small wounds on the left lateral which is actually the most prominent wound. Very small superficial wounds on the left anterior and a cluster of very small wounds on the right lateral. These are not particularly painful although he states that his legs are itchy. on arrival in the clinic our intake nurse could not obtain pulses bilaterally. She could not maintain of enough of a Doppler in the right dorsalis pedis to get an ABI and on the left she could not even obtain it by Doppler. The patient does have pain in his legs when he walks. He says 10 to  15 minutes he has to sit down and rest although he thought that this was because of his knees. He also mentions that his wounds are painful at night when he is lying in bed sometimes waking him up at 5:00 in the morning. Past medical history includes hypertension, CVA in 2011 with no residual effects, osteoarthritis of both knees, recent seizure disorder but apparently complete work-up was negative, ITP, vitamin B12 deficiency and stage III chronic renal failure We could not obtain ABIs in our clinic as noted above 03/11/2020 upon evaluation today patient actually appears to be doing much better in regard to his bilateral lower extremities. Fortunately there is no signs of infection. He actually saw Dr. Dellia Nims during his initial visit last week and he was treated very appropriately his wounds appear to be doing much better. Also don't have the complete evaluation as far as his arterial study is concerned that the vascular doctor has not read it yet. With that being said it did appear that the patient's findings were consistent with being okay. His TBI's were somewhat low at 0.39 on the right and 0.49 on the left with an ABI of 1.15 on the right and 1.35 on the left. With that being said I think that the fact that he is healing and has tolerated the wrap very well at this point is a good indication that he is good to do just fine. Objective Constitutional Well-nourished and well-hydrated in no acute distress. Vitals Time Taken: 1:30 PM, Height: 69 in, Weight: 173 lbs, BMI: 25.5, Temperature: 98.3 F, Pulse: 62 bpm, Respiratory Rate: 18 breaths/min, Blood Pressure: 151/73 mmHg. Respiratory normal breathing without difficulty. Psychiatric this patient is able to make decisions and demonstrates good insight into disease process. Alert and Oriented x 3. pleasant and cooperative. General Notes: Upon inspection patient's wound bed actually showed signs of healing quite nicely. The 1 remaining ulcer in  the left lateral lower extremity actually did show some slough noted I mechanically debrided this wound with saline  gauze he tolerated that well and post debridement wound bed appears to be doing much better which is great news. There is no signs of active infection at this time which is also excellent news. Integumentary (Hair, Skin) Wound #1 status is Healed - Epithelialized. Original cause of wound was Gradually Appeared. The wound is located on the Right,Anterior Lower Leg. The wound measures 0cm length x 0cm width x 0cm depth; 0cm^2 area and 0cm^3 volume. There is Fat Layer (Subcutaneous Tissue) Exposed exposed. There is a medium amount of serous drainage noted. The wound margin is flat and intact. There is large (67-100%) pink [name], [name] ([id number]) granulation within the wound bed. There is a small (1-33%) amount of necrotic tissue within the wound bed including Adherent Slough. Wound #2 status is Open. Original cause of wound was Gradually Appeared. The wound is located on the Left,Anterior Lower Leg. The wound measures 0cm length x 0cm width x 0cm depth; 0cm^2 area and 0cm^3 volume. There is Fat Layer (Subcutaneous Tissue) Exposed exposed. There is a medium amount of serous drainage noted. The wound margin is flat and intact. There is medium (34-66%) pink granulation within the wound bed. There is a medium (34-66%) amount of necrotic tissue within the wound bed including Adherent Slough. Wound #3 status is Open. Original cause of wound was Gradually Appeared. The wound is located on the Left,Lateral Lower Leg. The wound measures 1.5cm length x 0.2cm width x 0.1cm depth; 0.236cm^2 area and 0.024cm^3 volume. There is Fat Layer (Subcutaneous Tissue) Exposed exposed. There is a medium amount of serous drainage noted. The wound margin is flat and intact. There is small (1-33%) pink granulation within the wound bed. There is a large (67-100%) amount of necrotic tissue within the wound bed  including Adherent Slough. Assessment Active Problems ICD-10 Chronic venous hypertension (idiopathic) with ulcer and inflammation of bilateral lower extremity Non-pressure chronic ulcer of other part of left lower leg limited to breakdown of skin Non-pressure chronic ulcer of other part of right lower leg limited to breakdown of skin Atherosclerosis of native arteries of extremities with intermittent claudication, bilateral legs Plan Wound Cleansing: Wound #3 Left,Lateral Lower Leg: Clean wound with Normal Saline. Primary Wound Dressing: Wound #3 Left,Lateral Lower Leg: Silver Alginate Secondary Dressing: Wound #3 Left,Lateral Lower Leg: ABD pad Kerlix and Coban Dressing Change Frequency: Wound #3 Left,Lateral Lower Leg: Change dressing every week Follow-up Appointments: Wound #3 Left,Lateral Lower Leg: Return Appointment in 1 week. Edema Control: Wound #3 Left,Lateral Lower Leg: Kerlix and Coban - Bilateral Elevate legs to the level of the heart and pump ankles as often as possible 1. My suggestion at this time is good to be that we go ahead and initiate a continuation of the current wound care orders. This includes the silver alginate dressing this done very well along with the Curlex and Coban wrap which is also been beneficial for the patient. 2. He needs to continue to elevate his legs as much as possible to try to keep edema under good control. 3. With regard to the arterial study hopefully will have the complete meaning next week. Right now we do not have the complete study read back although it appears to be okay in my opinion. We will see patient back for reevaluation in 1 week here in the clinic. If anything worsens or changes patient will contact our office for additional recommendations. Electronic Signature(s) Signed: 03/11/2020 5:50:47 PM By: Worthy Keeler PA-C Entered By: Worthy Keeler on 03/11/2020 17:50:47  [name], [name]  ([id number]) -------------------------------------------------------------------------------- SuperBill Details Patient Name: [name], [name] Date of Service: 03/11/2020 Medical Record Number: [id number] Patient Account Number: [id number] Date of Birth/Sex: 1932-01-23 (86 y.o. M) Treating RN: [name] Primary Care Provider: Lavera Guise Other Clinician: Referring Provider: Lavera Guise Treating Provider/Extender: Melburn Hake, Destanee Bedonie Weeks in Treatment: 1 Diagnosis Coding ICD-10 Codes Code Description I87.333 Chronic venous hypertension (idiopathic) with ulcer and inflammation of bilateral lower extremity L97.821 Non-pressure chronic ulcer of other part of left lower leg limited to breakdown of skin L97.811 Non-pressure chronic ulcer of other part of right lower leg limited to breakdown of skin I70.213 Atherosclerosis of native arteries of extremities with intermittent claudication, bilateral legs Facility Procedures CPT4 Code: [id number] Description: 99213 - WOUND CARE VISIT-LEV 3 EST PT Modifier: Quantity: 1 Physician Procedures CPT4 Code Description: [id number] 99213 - WC PHYS LEVEL 3 - EST PT Modifier: Quantity: 1 CPT4 Code Description: ICD-10 Diagnosis Description I87.333 Chronic venous hypertension (idiopathic) with ulcer and inflammation of bila L97.821 Non-pressure chronic ulcer of other part of left lower leg limited to breakd L97.811 Non-pressure chronic  ulcer of other part of right lower leg limited to break I70.213 Atherosclerosis of native arteries of extremities with intermittent claudica Modifier: teral lower extre own of skin down of skin tion, bilateral l Quantity: mity egs Electronic Signature(s) Signed: 03/11/2020 5:51:04 PM By: Worthy Keeler PA-C Entered By: Worthy Keeler on 03/11/2020 17:51:04

## 2020-03-18 ENCOUNTER — Other Ambulatory Visit: Payer: Self-pay

## 2020-03-18 ENCOUNTER — Encounter: Payer: PPO | Attending: Physician Assistant | Admitting: Physician Assistant

## 2020-03-18 DIAGNOSIS — I129 Hypertensive chronic kidney disease with stage 1 through stage 4 chronic kidney disease, or unspecified chronic kidney disease: Secondary | ICD-10-CM | POA: Diagnosis not present

## 2020-03-18 DIAGNOSIS — N183 Chronic kidney disease, stage 3 unspecified: Secondary | ICD-10-CM | POA: Insufficient documentation

## 2020-03-18 DIAGNOSIS — I87333 Chronic venous hypertension (idiopathic) with ulcer and inflammation of bilateral lower extremity: Secondary | ICD-10-CM | POA: Insufficient documentation

## 2020-03-18 DIAGNOSIS — L97811 Non-pressure chronic ulcer of other part of right lower leg limited to breakdown of skin: Secondary | ICD-10-CM | POA: Diagnosis not present

## 2020-03-18 DIAGNOSIS — L97821 Non-pressure chronic ulcer of other part of left lower leg limited to breakdown of skin: Secondary | ICD-10-CM | POA: Diagnosis not present

## 2020-03-18 DIAGNOSIS — M170 Bilateral primary osteoarthritis of knee: Secondary | ICD-10-CM | POA: Diagnosis not present

## 2020-03-18 DIAGNOSIS — I70213 Atherosclerosis of native arteries of extremities with intermittent claudication, bilateral legs: Secondary | ICD-10-CM | POA: Diagnosis not present

## 2020-03-18 NOTE — Progress Notes (Signed)
HARL, WIECHMANN ([id number]) Visit Report for 03/18/2020 Arrival Information Details Patient Name: [name] [name] [name] [name]. Date of Service: 03/18/2020 2:00 PM Medical Record Number: [id number] Patient Account Number: [id number] Date of Birth/Sex: Mar 26, 1932 (86 y.o. M) Treating RN: [name] Primary Care Basir Niven: Lavera Guise Other Clinician: Referring Kanitra Purifoy: Lavera Guise Treating Lourdez Mcgahan/Extender: Melburn Hake, HOYT Weeks in Treatment: 2 Visit Information History Since Last Visit Added or deleted any medications: No Patient Arrived: Ambulatory Any new allergies or adverse reactions: No Arrival Time: 14:06 Signs or symptoms of abuse/neglect since last visito No Accompanied By: wife Hospitalized since last visit: No Transfer Assistance: None Implantable device outside of the clinic excluding No Patient Identification Verified: Yes cellular tissue based products placed in the center Secondary Verification Process Completed: Yes since last visit: Patient Has Alerts: Yes Has Dressing in Place as Prescribed: Yes Patient Alerts: Patient on Blood Thinner Pain Present Now: No Aspirin 325 Electronic Signature(s) Signed: 03/18/2020 4:30:59 PM By: Lorine Bears RCP, RRT, CHT Entered By: Lorine Bears on 03/18/2020 14:07:56 [name] [name] [name] ([id number]) -------------------------------------------------------------------------------- Clinic Level of Care Assessment Details Patient Name: [name] [name] [place] B. Date of Service: 03/18/2020 2:00 PM Medical Record Number: [id number] Patient Account Number: [id number] Date of Birth/Sex: [birth year] (86 y.o. M) Treating RN: [name] Primary Care Maiyah Goyne: Lavera Guise Other Clinician: Referring Rumaisa Schnetzer: Lavera Guise Treating Chontel Warning/Extender: Melburn Hake, HOYT Weeks in Treatment: 2 Clinic Level of Care Assessment Items TOOL 4 Quantity Score []  - Use when only an EandM is performed on FOLLOW-UP visit 0 ASSESSMENTS - Nursing  Assessment / Reassessment X - Reassessment of Co-morbidities (includes updates in patient status) 1 10 X- 1 5 Reassessment of Adherence to Treatment Plan ASSESSMENTS - Wound and Skin Assessment / Reassessment X - Simple Wound Assessment / Reassessment - one wound 1 5 []  - 0 Complex Wound Assessment / Reassessment - multiple wounds []  - 0 Dermatologic / Skin Assessment (not related to wound area) ASSESSMENTS - Focused Assessment []  - Circumferential Edema Measurements - multi extremities 0 []  - 0 Nutritional Assessment / Counseling / Intervention []  - 0 Lower Extremity Assessment (monofilament, tuning fork, pulses) []  - 0 Peripheral Arterial Disease Assessment (using hand held doppler) ASSESSMENTS - Ostomy and/or Continence Assessment and Care []  - Incontinence Assessment and Management 0 []  - 0 Ostomy Care Assessment and Management (repouching, etc.) PROCESS - Coordination of Care X - Simple Patient / Family Education for ongoing care 1 15 []  - 0 Complex (extensive) Patient / Family Education for ongoing care []  - 0 Staff obtains Programmer, systems, Records, Test Results / Process Orders []  - 0 Staff telephones HHA, Nursing Homes / Clarify orders / etc []  - 0 Routine Transfer to another Facility (non-emergent condition) []  - 0 Routine Hospital Admission (non-emergent condition) []  - 0 New Admissions / Biomedical engineer / Ordering NPWT, Apligraf, etc. []  - 0 Emergency Hospital Admission (emergent condition) X- 1 10 Simple Discharge Coordination []  - 0 Complex (extensive) Discharge Coordination PROCESS - Special Needs []  - Pediatric / Minor Patient Management 0 []  - 0 Isolation Patient Management []  - 0 Hearing / Language / Visual special needs []  - 0 Assessment of Community assistance (transportation, D/C planning, etc.) []  - 0 Additional assistance / Altered mentation []  - 0 Support Surface(s) Assessment (bed, cushion, seat, etc.) INTERVENTIONS - Wound Cleansing /  Measurement [name] [name] B. ([id number]) X- 1 5 Simple Wound Cleansing - one wound []  - 0 Complex Wound Cleansing - multiple wounds X- 1 5 Wound Imaging (photographs -  any number of wounds) []  - 0 Wound Tracing (instead of photographs) X- 1 5 Simple Wound Measurement - one wound []  - 0 Complex Wound Measurement - multiple wounds INTERVENTIONS - Wound Dressings []  - Small Wound Dressing one or multiple wounds 0 X- 1 15 Medium Wound Dressing one or multiple wounds []  - 0 Large Wound Dressing one or multiple wounds []  - 0 Application of Medications - topical []  - 0 Application of Medications - injection INTERVENTIONS - Miscellaneous []  - External ear exam 0 []  - 0 Specimen Collection (cultures, biopsies, blood, body fluids, etc.) []  - 0 Specimen(s) / Culture(s) sent or taken to Lab for analysis []  - 0 Patient Transfer (multiple staff / Civil Service fast streamer / Similar devices) []  - 0 Simple Staple / Suture removal (25 or less) []  - 0 Complex Staple / Suture removal (26 or more) []  - 0 Hypo / Hyperglycemic Management (close monitor of Blood Glucose) []  - 0 Ankle / Brachial Index (ABI) - do not check if billed separately X- 1 5 Vital Signs Has the patient been seen at the hospital within the last three years: Yes Total Score: 80 Level Of Care: New/Established - Level 3 Electronic Signature(s) Signed: 03/18/2020 3:08:17 PM By: [name] Entered By: [name] on 03/18/2020 14:40:27 [name] [name] [name] ([id number]) -------------------------------------------------------------------------------- Encounter Discharge Information Details Patient Name: [name] Mole B. Date of Service: 03/18/2020 2:00 PM Medical Record Number: [id number] Patient Account Number: [id number] Date of Birth/Sex: 1932-06-13 (86 y.o. M) Treating RN: [name] Primary Care Karie Skowron: Lavera Guise Other Clinician: Referring Najir Roop: Lavera Guise Treating Rossie Bretado/Extender: Melburn Hake, HOYT Weeks in Treatment:  2 Encounter Discharge Information Items Discharge Condition: Stable Ambulatory Status: Ambulatory Discharge Destination: Home Transportation: Private Auto Accompanied By: spouse Schedule Follow-up Appointment: Yes Clinical Summary of Care: Electronic Signature(s) Signed: 03/18/2020 3:08:17 PM By: [name] Entered By: [name] on 03/18/2020 14:42:23 [name] [name] [name] ([id number]) -------------------------------------------------------------------------------- Lower Extremity Assessment Details Patient Name: [name] Mole B. Date of Service: 03/18/2020 2:00 PM Medical Record Number: [id number] Patient Account Number: [id number] Date of Birth/Sex: 1932/11/09 (86 y.o. M) Treating RN: Montey Hora Primary Care Kage Willmann: Lavera Guise Other Clinician: Referring Nethra Mehlberg: Lavera Guise Treating Ray Gervasi/Extender: Melburn Hake, HOYT Weeks in Treatment: 2 Edema Assessment Assessed: [Left: No] [Right: No] Edema: [Left: Yes] [Right: Yes] Calf Left: Right: Point of Measurement: 32 cm From Medial Instep 35 cm 35 cm Ankle Left: Right: Point of Measurement: 10 cm From Medial Instep 28.5 cm 27.5 cm Vascular Assessment Pulses: Dorsalis Pedis Palpable: [Left:Yes] [Right:Yes] Electronic Signature(s) Signed: 03/18/2020 4:19:43 PM By: Montey Hora Entered By: Montey Hora on 03/18/2020 14:14:26 [name] [name] [name] ([id number]) -------------------------------------------------------------------------------- Multi Wound Chart Details Patient Name: [name] Mole B. Date of Service: 03/18/2020 2:00 PM Medical Record Number: [id number] Patient Account Number: [id number] Date of Birth/Sex: 1931/12/22 (86 y.o. M) Treating RN: [name] Primary Care Marcellius Montagna: Lavera Guise Other Clinician: Referring Elayah Klooster: Lavera Guise Treating Mariea Mcmartin/Extender: Melburn Hake, HOYT Weeks in Treatment: 2 Vital Signs Height(in): 69 Pulse(bpm): 9 Weight(lbs): 173 Blood Pressure(mmHg): 163/59 Body Mass Index(BMI):  26 Temperature(F): 97.8 Respiratory Rate(breaths/min): 16 Photos: [N/A:N/A] Wound Location: Left, Lateral Lower Leg N/A N/A Wounding Event: Gradually Appeared N/A N/A Primary Etiology: Venous Leg Ulcer N/A N/A Comorbid History: Hypertension, End Stage Renal N/A N/A Disease, Osteoarthritis, Seizure Disorder Date Acquired: 02/19/2020 N/A N/A Weeks of Treatment: 2 N/A N/A Wound Status: Open N/A N/A Wound Recurrence: Yes N/A N/A Measurements L x W x D (cm) 1.4x0.5x0.1 N/A N/A Area (cm) : 0.55 N/A N/A  Volume (cm) : 0.055 N/A N/A % Reduction in Area: 6.60% N/A N/A % Reduction in Volume: 6.80% N/A N/A Classification: Full Thickness Without Exposed N/A N/A Support Structures Exudate Amount: Medium N/A N/A Exudate Type: Serous N/A N/A Exudate Color: amber N/A N/A Wound Margin: Flat and Intact N/A N/A Granulation Amount: Small (1-33%) N/A N/A Granulation Quality: Pink N/A N/A Necrotic Amount: Large (67-100%) N/A N/A Exposed Structures: Fat Layer (Subcutaneous Tissue) N/A N/A Exposed: Yes Fascia: No Tendon: No Muscle: No Joint: No Bone: No Epithelialization: None N/A N/A Treatment Notes Electronic Signature(s) Signed: 03/18/2020 3:08:17 PM By: [name] Entered By: [name] on 03/18/2020 14:38:06 [name] [name] [name] ([id number]) [place], [name] [name] ([id number]) -------------------------------------------------------------------------------- Multi-Disciplinary Care Plan Details Patient Name: [name] [name] [name] B. Date of Service: 03/18/2020 2:00 PM Medical Record Number: [id number] Patient Account Number: [id number] Date of Birth/Sex: 1931/12/16 (86 y.o. M) Treating RN: [name] Primary Care Callee Rohrig: Lavera Guise Other Clinician: Referring Mcgregor Tinnon: Lavera Guise Treating Raquon Milledge/Extender: Melburn Hake, HOYT Weeks in Treatment: 2 Active Inactive Orientation to the Wound Care Program Nursing Diagnoses: Knowledge deficit related to the wound healing center  program Goals: Patient/caregiver will verbalize understanding of the [facility] Program Date Initiated: 03/04/2020 Target Resolution Date: 04/11/2020 Goal Status: Active Interventions: Provide education on orientation to the wound center Notes: Venous Leg Ulcer Nursing Diagnoses: Knowledge deficit related to disease process and management Goals: Patient/caregiver will verbalize understanding of disease process and disease management Date Initiated: 03/04/2020 Target Resolution Date: 04/11/2020 Goal Status: Active Interventions: Assess peripheral edema status every visit. Notes: Wound/Skin Impairment Nursing Diagnoses: Impaired tissue integrity Goals: Ulcer/skin breakdown will have a volume reduction of 30% by week 4 Date Initiated: 03/04/2020 Target Resolution Date: 03/31/2020 Goal Status: Active Interventions: Assess ulceration(s) every visit Notes: Electronic Signature(s) Signed: 03/18/2020 3:08:17 PM By: [name] Entered By: [name] on 03/18/2020 14:37:29 [name] [name] [name] ([id number]) -------------------------------------------------------------------------------- Pain Assessment Details Patient Name: [name] Mole B. Date of Service: 03/18/2020 2:00 PM Medical Record Number: [id number] Patient Account Number: [id number] Date of Birth/Sex: 10/04/1932 (86 y.o. M) Treating RN: Montey Hora Primary Care Haizley Cannella: Lavera Guise Other Clinician: Referring Bashar Milam: Lavera Guise Treating Crystian Frith/Extender: Melburn Hake, HOYT Weeks in Treatment: 2 Active Problems Location of Pain Severity and Description of Pain Patient Has Paino Yes Site Locations Pain Location: Pain in Ulcers Character of Pain Describe the Pain: Burning Pain Management and Medication Current Pain Management: Electronic Signature(s) Signed: 03/18/2020 4:19:43 PM By: Montey Hora Entered By: Montey Hora on 03/18/2020 14:11:56 [name] [name] [name]  ([id number]) -------------------------------------------------------------------------------- Patient/Caregiver Education Details Patient Name: [name] Mole B. Date of Service: 03/18/2020 2:00 PM Medical Record Number: [id number] Patient Account Number: [id number] Date of Birth/Gender: 1932/02/05 (86 y.o. M) Treating RN: [name] Primary Care Physician: Lavera Guise Other Clinician: Referring Physician: Lavera Guise Treating Physician/Extender: Sharalyn Ink in Treatment: 2 Education Assessment Education Provided To: Patient Education Topics Provided Wound/Skin Impairment: Handouts: Caring for Your Ulcer Methods: Demonstration, Explain/Verbal Responses: State content correctly Electronic Signature(s) Signed: 03/18/2020 3:08:17 PM By: [name] Entered By: [name] on 03/18/2020 14:41:55 [name] [name] [name] ([id number]) -------------------------------------------------------------------------------- Wound Assessment Details Patient Name: [name] Mole B. Date of Service: 03/18/2020 2:00 PM Medical Record Number: [id number] Patient Account Number: [id number] Date of Birth/Sex: February 26, 1932 (86 y.o. M) Treating RN: Montey Hora Primary Care Sequoia Mincey: Lavera Guise Other Clinician: Referring Yazir Koerber: Lavera Guise Treating Reily Ilic/Extender: Melburn Hake, HOYT Weeks in Treatment: 2 Wound Status Wound Number: 3 Primary Venous Leg Ulcer Etiology: Wound Location: Left, Lateral Lower Leg Wound  Status: Open Wounding Event: Gradually Appeared Comorbid Hypertension, End Stage Renal Disease, Osteoarthritis, Date Acquired: 02/19/2020 History: Seizure Disorder Weeks Of Treatment: 2 Clustered Wound: No Photos Wound Measurements Length: (cm) 1.4 Width: (cm) 0.5 Depth: (cm) 0.1 Area: (cm) 0.55 Volume: (cm) 0.055 % Reduction in Area: 6.6% % Reduction in Volume: 6.8% Epithelialization: None Tunneling: No Undermining: No Wound Description Classification: Full Thickness Without  Exposed Support Struct Wound Margin: Flat and Intact Exudate Amount: Medium Exudate Type: Serous Exudate Color: amber ures Foul Odor After Cleansing: No Slough/Fibrino Yes Wound Bed Granulation Amount: Small (1-33%) Exposed Structure Granulation Quality: Pink Fascia Exposed: No Necrotic Amount: Large (67-100%) Fat Layer (Subcutaneous Tissue) Exposed: Yes Necrotic Quality: Adherent Slough Tendon Exposed: No Muscle Exposed: No Joint Exposed: No Bone Exposed: No Treatment Notes Wound #3 (Left, Lateral Lower Leg) Notes prisma, ABD, Tubi Electronic Signature(s) Signed: 03/18/2020 4:19:43 PM By: Durene Fruits ([id number]) Entered By: Montey Hora on 03/18/2020 14:15:48 [place], [name] [name] ([id number]) -------------------------------------------------------------------------------- Vitals Details Patient Name: [name] Mole B. Date of Service: 03/18/2020 2:00 PM Medical Record Number: [id number] Patient Account Number: [id number] Date of Birth/Sex: 11/21/1931 (86 y.o. M) Treating RN: [name] Primary Care Arhaan Chesnut: Lavera Guise Other Clinician: Referring Audrey Thull: Lavera Guise Treating Jospeh Mangel/Extender: Melburn Hake, HOYT Weeks in Treatment: 2 Vital Signs Time Taken: 14:05 Temperature (F): 97.8 Height (in): 69 Pulse (bpm): 56 Weight (lbs): 173 Respiratory Rate (breaths/min): 16 Body Mass Index (BMI): 25.5 Blood Pressure (mmHg): 163/59 Reference Range: 80 - 120 mg / dl Electronic Signature(s) Signed: 03/18/2020 4:30:59 PM By: Lorine Bears RCP, RRT, CHT Entered By: Lorine Bears on 03/18/2020 14:08:50

## 2020-03-18 NOTE — Progress Notes (Addendum)
SACRAMENTO, MONDS ([id number]) Visit Report for 03/18/2020 Chief Complaint Document Details Patient Name: [name], [name] Date of Service: 03/18/2020 2:00 PM Medical Record Number: [id number] Patient Account Number: [id number] Date of Birth/Sex: [birth year] (86 y.o. M) Treating RN: [name] Primary Care Provider: Lavera Guise Other Clinician: Referring Provider: Lavera Guise Treating Provider/Extender: Melburn Hake, Diego Ulbricht Weeks in Treatment: 2 Information Obtained from: Patient Chief Complaint 03/04/2020; patient is here for review of bilateral small lower extremity wounds Electronic Signature(s) Signed: 03/18/2020 2:20:00 PM By: Worthy Keeler PA-C Entered By: Worthy Keeler on 03/18/2020 [place] ([id number]) -------------------------------------------------------------------------------- HPI Details Patient Name: [name] Mole B. Date of Service: 03/18/2020 2:00 PM Medical Record Number: [id number] Patient Account Number: [id number] Date of Birth/Sex: Nov 21, 1931 (86 y.o. M) Treating RN: [name] Primary Care Provider: Lavera Guise Other Clinician: Referring Provider: Lavera Guise Treating Provider/Extender: Melburn Hake, Monique Hefty Weeks in Treatment: 2 History of Present Illness HPI Description: ADMISSION 03/04/2020 Patient is an 86 year old man who arrives accompanied by his wife. He states that he has had wounds on his bilateral lower extremities that started with increased swelling in his lower extremities. He has quite significant bilateral lower extremity pitting edema but he states that this is only been there for 2 or 3 weeks. Says he saw his primary doctor who put him on what sounds like a diuretic. He has developed small wounds on the left lateral which is actually the most prominent wound. Very small superficial wounds on the left anterior and a cluster of very small wounds on the right lateral. These are not particularly painful although he states that his legs are  itchy. on arrival in the clinic our intake nurse could not obtain pulses bilaterally. She could not maintain of enough of a Doppler in the right dorsalis pedis to get an ABI and on the left she could not even obtain it by Doppler. The patient does have pain in his legs when he walks. He says 10 to 15 minutes he has to sit down and rest although he thought that this was because of his knees. He also mentions that his wounds are painful at night when he is lying in bed sometimes waking him up at 5:00 in the morning. Past medical history includes hypertension, CVA in 2011 with no residual effects, osteoarthritis of both knees, recent seizure disorder but apparently complete work-up was negative, ITP, vitamin B12 deficiency and stage III chronic renal failure We could not obtain ABIs in our clinic as noted above 03/11/2020 upon evaluation today patient actually appears to be doing much better in regard to his bilateral lower extremities. Fortunately there is no signs of infection. He actually saw Dr. Dellia Nims during his initial visit last week and he was treated very appropriately his wounds appear to be doing much better. Also don't have the complete evaluation as far as his arterial study is concerned that the vascular doctor has not read it yet. With that being said it did appear that the patient's findings were consistent with being okay. His TBI's were somewhat low at 0.39 on the right and 0.49 on the left with an ABI of 1.15 on the right and 1.35 on the left. With that being said I think that the fact that he is healing and has tolerated the wrap very well at this point is a good indication that he is good to do just fine. 03/18/2020 upon evaluation today patient appears to be doing pretty well in regard to his leg  ulcer on the left. Fortunately there is no signs of active infection at this time. No fevers, chills, nausea, vomiting, or diarrhea. I am actually rather pleased with how things seem to be  progressing. Overall the patient's wound I think is ready for collagen I do not believe there is enough drainage to warrant having to continue with the alginate at this point. Electronic Signature(s) Signed: 03/18/2020 2:44:36 PM By: Worthy Keeler PA-C Entered By: Worthy Keeler on 03/18/2020 14:44:35 Bowden, [name] ([id number]) -------------------------------------------------------------------------------- Physical Exam Details Patient Name: MAVRIC, [name] Date of Service: 03/18/2020 2:00 PM Medical Record Number: [id number] Patient Account Number: [id number] Date of Birth/Sex: 10-24-1932 (86 y.o. M) Treating RN: [name] Primary Care Provider: Lavera Guise Other Clinician: Referring Provider: Lavera Guise Treating Provider/Extender: STONE III, Taleyah Hillman Weeks in Treatment: 2 Constitutional Well-nourished and well-hydrated in no acute distress. Respiratory normal breathing without difficulty. Psychiatric this patient is able to make decisions and demonstrates good insight into disease process. Alert and Oriented x 3. pleasant and cooperative. Notes Patient's wound bed actually showed signs of good granulation at this time. Fortunately there is no evidence of active infection which is good news. No fevers, chills, nausea, vomiting, or diarrhea. Electronic Signature(s) Signed: 03/18/2020 2:44:50 PM By: Worthy Keeler PA-C Entered By: Worthy Keeler on 03/18/2020 14:44:49 Ainsley, [name] ([id number]) -------------------------------------------------------------------------------- Physician Orders Details Patient Name: ALEXANDRO, [name] Date of Service: 03/18/2020 2:00 PM Medical Record Number: [id number] Patient Account Number: [id number] Date of Birth/Sex: [birth year] (86 y.o. M) Treating RN: [name] Primary Care Provider: Lavera Guise Other Clinician: Referring Provider: Lavera Guise Treating Provider/Extender: Melburn Hake, Imanie Darrow Weeks in Treatment: 2 Verbal / Phone Orders:  No Diagnosis Coding ICD-10 Coding Code Description I87.333 Chronic venous hypertension (idiopathic) with ulcer and inflammation of bilateral lower extremity L97.821 Non-pressure chronic ulcer of other part of left lower leg limited to breakdown of skin L97.811 Non-pressure chronic ulcer of other part of right lower leg limited to breakdown of skin I70.213 Atherosclerosis of native arteries of extremities with intermittent claudication, bilateral legs Wound Cleansing Wound #3 Left,Lateral Lower Leg o Clean wound with Normal Saline. Primary Wound Dressing Wound #3 Left,Lateral Lower Leg o Silver Collagen - moisten with hydrogel Secondary Dressing Wound #3 Left,Lateral Lower Leg o ABD and Kerlix/Conform Dressing Change Frequency Wound #3 Left,Lateral Lower Leg o Change dressing every week Follow-up Appointments Wound #3 Left,Lateral Lower Leg o Return Appointment in 1 week. Edema Control Wound #3 Left,Lateral Lower Leg o Elevate legs to the level of the heart and pump ankles as often as possible o Other: - tubi grip Electronic Signature(s) Signed: 03/18/2020 3:08:17 PM By: [name] Signed: 03/18/2020 4:59:38 PM By: Worthy Keeler PA-C Entered By: [name] on 03/18/2020 14:41:35 Scheiber, [name] ([id number]) -------------------------------------------------------------------------------- Problem List Details Patient Name: ABHIJAY, MORRISS B. Date of Service: 03/18/2020 2:00 PM Medical Record Number: [id number] Patient Account Number: [id number] Date of Birth/Sex: January 25, 1932 (86 y.o. M) Treating RN: [name] Primary Care Provider: Lavera Guise Other Clinician: Referring Provider: Lavera Guise Treating Provider/Extender: Melburn Hake, Jajaira Ruis Weeks in Treatment: 2 Active Problems ICD-10 Encounter Code Description Active Date MDM Diagnosis I87.333 Chronic venous hypertension (idiopathic) with ulcer and inflammation of 03/04/2020 No Yes bilateral lower extremity L97.821  Non-pressure chronic ulcer of other part of left lower leg limited to 03/04/2020 No Yes breakdown of skin L97.811 Non-pressure chronic ulcer of other part of right lower leg limited to 03/04/2020 No Yes breakdown of skin I70.213 Atherosclerosis of native  arteries of extremities with intermittent 03/04/2020 No Yes claudication, bilateral legs Inactive Problems Resolved Problems Electronic Signature(s) Signed: 03/18/2020 2:19:54 PM By: Worthy Keeler PA-C Entered By: Worthy Keeler on 03/18/2020 14:19:54 Colello, [name] ([id number]) -------------------------------------------------------------------------------- Progress Note Details Patient Name: Ramp, Damarrion B. Date of Service: 03/18/2020 2:00 PM Medical Record Number: [id number] Patient Account Number: [id number] Date of Birth/Sex: 1932/04/11 (86 y.o. M) Treating RN: [name] Primary Care Provider: Lavera Guise Other Clinician: Referring Provider: Lavera Guise Treating Provider/Extender: Melburn Hake, Daisuke Bailey Weeks in Treatment: 2 Subjective Chief Complaint Information obtained from Patient 03/04/2020; patient is here for review of bilateral small lower extremity wounds History of Present Illness (HPI) ADMISSION 03/04/2020 Patient is an 86 year old man who arrives accompanied by his wife. He states that he has had wounds on his bilateral lower extremities that started with increased swelling in his lower extremities. He has quite significant bilateral lower extremity pitting edema but he states that this is only been there for 2 or 3 weeks. Says he saw his primary doctor who put him on what sounds like a diuretic. He has developed small wounds on the left lateral which is actually the most prominent wound. Very small superficial wounds on the left anterior and a cluster of very small wounds on the right lateral. These are not particularly painful although he states that his legs are itchy. on arrival in the clinic our intake nurse could not  obtain pulses bilaterally. She could not maintain of enough of a Doppler in the right dorsalis pedis to get an ABI and on the left she could not even obtain it by Doppler. The patient does have pain in his legs when he walks. He says 10 to 15 minutes he has to sit down and rest although he thought that this was because of his knees. He also mentions that his wounds are painful at night when he is lying in bed sometimes waking him up at 5:00 in the morning. Past medical history includes hypertension, CVA in 2011 with no residual effects, osteoarthritis of both knees, recent seizure disorder but apparently complete work-up was negative, ITP, vitamin B12 deficiency and stage III chronic renal failure We could not obtain ABIs in our clinic as noted above 03/11/2020 upon evaluation today patient actually appears to be doing much better in regard to his bilateral lower extremities. Fortunately there is no signs of infection. He actually saw Dr. Dellia Nims during his initial visit last week and he was treated very appropriately his wounds appear to be doing much better. Also don't have the complete evaluation as far as his arterial study is concerned that the vascular doctor has not read it yet. With that being said it did appear that the patient's findings were consistent with being okay. His TBI's were somewhat low at 0.39 on the right and 0.49 on the left with an ABI of 1.15 on the right and 1.35 on the left. With that being said I think that the fact that he is healing and has tolerated the wrap very well at this point is a good indication that he is good to do just fine. 03/18/2020 upon evaluation today patient appears to be doing pretty well in regard to his leg ulcer on the left. Fortunately there is no signs of active infection at this time. No fevers, chills, nausea, vomiting, or diarrhea. I am actually rather pleased with how things seem to be progressing. Overall the patient's wound I think is ready for  collagen I  do not believe there is enough drainage to warrant having to continue with the alginate at this point. Objective Constitutional Well-nourished and well-hydrated in no acute distress. Vitals Time Taken: 2:05 PM, Height: 69 in, Weight: 173 lbs, BMI: 25.5, Temperature: 97.8 F, Pulse: 56 bpm, Respiratory Rate: 16 breaths/min, Blood Pressure: 163/59 mmHg. Respiratory normal breathing without difficulty. Psychiatric this patient is able to make decisions and demonstrates good insight into disease process. Alert and Oriented x 3. pleasant and cooperative. General Notes: Patient's wound bed actually showed signs of good granulation at this time. Fortunately there is no evidence of active infection which is good news. No fevers, chills, nausea, vomiting, or diarrhea. JAKOTA, MANTHEI ([id number]) Integumentary (Hair, Skin) Wound #3 status is Open. Original cause of wound was Gradually Appeared. The wound is located on the Left,Lateral Lower Leg. The wound measures 1.4cm length x 0.5cm width x 0.1cm depth; 0.55cm^2 area and 0.055cm^3 volume. There is Fat Layer (Subcutaneous Tissue) Exposed exposed. There is no tunneling or undermining noted. There is a medium amount of serous drainage noted. The wound margin is flat and intact. There is small (1-33%) pink granulation within the wound bed. There is a large (67-100%) amount of necrotic tissue within the wound bed including Adherent Slough. Assessment Active Problems ICD-10 Chronic venous hypertension (idiopathic) with ulcer and inflammation of bilateral lower extremity Non-pressure chronic ulcer of other part of left lower leg limited to breakdown of skin Non-pressure chronic ulcer of other part of right lower leg limited to breakdown of skin Atherosclerosis of native arteries of extremities with intermittent claudication, bilateral legs Plan Wound Cleansing: Wound #3 Left,Lateral Lower Leg: Clean wound with Normal Saline. Primary Wound  Dressing: Wound #3 Left,Lateral Lower Leg: Silver Collagen - moisten with hydrogel Secondary Dressing: Wound #3 Left,Lateral Lower Leg: ABD and Kerlix/Conform Dressing Change Frequency: Wound #3 Left,Lateral Lower Leg: Change dressing every week Follow-up Appointments: Wound #3 Left,Lateral Lower Leg: Return Appointment in 1 week. Edema Control: Wound #3 Left,Lateral Lower Leg: Elevate legs to the level of the heart and pump ankles as often as possible Other: - tubi grip 1. I would recommend currently that we go ahead and continue with management of the patient's ulcer we will initiate treatment with a silver collagen dressing. I think that this is absolutely appropriate and hopefully should do extremely well for the patient. 2. I am also can a suggest that we use Tubigrip double layer for the patient on both legs this week and then next week he does have his compression socks which we should be able to transition into. At least on the right. The left may not be completely healed by that time. 3. I am going to suggest as well at this point that we go ahead and have the patient continue with elevation of his legs is much as possible to try to keep the edema under good control. I think that still of utmost importance. We will see patient back for reevaluation in 1 week here in the clinic. If anything worsens or changes patient will contact our office for additional recommendations. Electronic Signature(s) Signed: 03/18/2020 2:45:53 PM By: Worthy Keeler PA-C Entered By: Worthy Keeler on 03/18/2020 14:45:52 Hlavacek, [name] ([id number]) -------------------------------------------------------------------------------- SuperBill Details Patient Name: [name] Mole B. Date of Service: 03/18/2020 Medical Record Number: [id number] Patient Account Number: [id number] Date of Birth/Sex: May 14, 1932 (86 y.o. M) Treating RN: [name] Primary Care Provider: Lavera Guise Other Clinician: Referring  Provider: Lavera Guise Treating Provider/Extender: Melburn Hake, Almendra Loria  Weeks in Treatment: 2 Diagnosis Coding ICD-10 Codes Code Description I87.333 Chronic venous hypertension (idiopathic) with ulcer and inflammation of bilateral lower extremity L97.821 Non-pressure chronic ulcer of other part of left lower leg limited to breakdown of skin L97.811 Non-pressure chronic ulcer of other part of right lower leg limited to breakdown of skin I70.213 Atherosclerosis of native arteries of extremities with intermittent claudication, bilateral legs Facility Procedures CPT4 Code: [id number] Description: 99213 - WOUND CARE VISIT-LEV 3 EST PT Modifier: Quantity: 1 Physician Procedures CPT4 Code Description: [id number] 99213 - WC PHYS LEVEL 3 - EST PT Modifier: Quantity: 1 CPT4 Code Description: ICD-10 Diagnosis Description I87.333 Chronic venous hypertension (idiopathic) with ulcer and inflammation of bila L97.821 Non-pressure chronic ulcer of other part of left lower leg limited to breakd L97.811 Non-pressure chronic  ulcer of other part of right lower leg limited to break I70.213 Atherosclerosis of native arteries of extremities with intermittent claudica Modifier: teral lower extre own of skin down of skin tion, bilateral l Quantity: mity egs Electronic Signature(s) Signed: 03/18/2020 2:46:08 PM By: Worthy Keeler PA-C Entered By: Worthy Keeler on 03/18/2020 14:46:07

## 2020-03-23 ENCOUNTER — Encounter: Payer: Self-pay | Admitting: Internal Medicine

## 2020-03-23 ENCOUNTER — Inpatient Hospital Stay (HOSPITAL_BASED_OUTPATIENT_CLINIC_OR_DEPARTMENT_OTHER): Payer: PPO | Admitting: Internal Medicine

## 2020-03-23 ENCOUNTER — Inpatient Hospital Stay: Payer: PPO | Attending: Internal Medicine

## 2020-03-23 ENCOUNTER — Other Ambulatory Visit: Payer: Self-pay

## 2020-03-23 DIAGNOSIS — Z8249 Family history of ischemic heart disease and other diseases of the circulatory system: Secondary | ICD-10-CM | POA: Diagnosis not present

## 2020-03-23 DIAGNOSIS — K219 Gastro-esophageal reflux disease without esophagitis: Secondary | ICD-10-CM | POA: Insufficient documentation

## 2020-03-23 DIAGNOSIS — Z79899 Other long term (current) drug therapy: Secondary | ICD-10-CM | POA: Diagnosis not present

## 2020-03-23 DIAGNOSIS — D631 Anemia in chronic kidney disease: Secondary | ICD-10-CM | POA: Diagnosis not present

## 2020-03-23 DIAGNOSIS — Z8379 Family history of other diseases of the digestive system: Secondary | ICD-10-CM | POA: Diagnosis not present

## 2020-03-23 DIAGNOSIS — Z8673 Personal history of transient ischemic attack (TIA), and cerebral infarction without residual deficits: Secondary | ICD-10-CM | POA: Diagnosis not present

## 2020-03-23 DIAGNOSIS — Z885 Allergy status to narcotic agent status: Secondary | ICD-10-CM | POA: Diagnosis not present

## 2020-03-23 DIAGNOSIS — D693 Immune thrombocytopenic purpura: Secondary | ICD-10-CM

## 2020-03-23 DIAGNOSIS — R569 Unspecified convulsions: Secondary | ICD-10-CM | POA: Insufficient documentation

## 2020-03-23 DIAGNOSIS — N183 Chronic kidney disease, stage 3 unspecified: Secondary | ICD-10-CM | POA: Insufficient documentation

## 2020-03-23 LAB — CBC WITH DIFFERENTIAL/PLATELET
Abs Immature Granulocytes: 0.04 10*3/uL (ref 0.00–0.07)
Basophils Absolute: 0 10*3/uL (ref 0.0–0.1)
Basophils Relative: 0 %
Eosinophils Absolute: 0.3 10*3/uL (ref 0.0–0.5)
Eosinophils Relative: 4 %
HCT: 34.3 % — ABNORMAL LOW (ref 39.0–52.0)
Hemoglobin: 11.3 g/dL — ABNORMAL LOW (ref 13.0–17.0)
Immature Granulocytes: 1 %
Lymphocytes Relative: 14 %
Lymphs Abs: 1.2 10*3/uL (ref 0.7–4.0)
MCH: 30.1 pg (ref 26.0–34.0)
MCHC: 32.9 g/dL (ref 30.0–36.0)
MCV: 91.2 fL (ref 80.0–100.0)
Monocytes Absolute: 0.7 10*3/uL (ref 0.1–1.0)
Monocytes Relative: 9 %
Neutro Abs: 6.1 10*3/uL (ref 1.7–7.7)
Neutrophils Relative %: 72 %
Platelets: 167 10*3/uL (ref 150–400)
RBC: 3.76 MIL/uL — ABNORMAL LOW (ref 4.22–5.81)
RDW: 14.6 % (ref 11.5–15.5)
WBC: 8.4 10*3/uL (ref 4.0–10.5)
nRBC: 0 % (ref 0.0–0.2)

## 2020-03-23 LAB — BASIC METABOLIC PANEL
Anion gap: 8 (ref 5–15)
BUN: 21 mg/dL (ref 8–23)
CO2: 27 mmol/L (ref 22–32)
Calcium: 8.3 mg/dL — ABNORMAL LOW (ref 8.9–10.3)
Chloride: 105 mmol/L (ref 98–111)
Creatinine, Ser: 1.32 mg/dL — ABNORMAL HIGH (ref 0.61–1.24)
GFR calc Af Amer: 56 mL/min — ABNORMAL LOW (ref >60)
GFR calc non Af Amer: 49 mL/min — ABNORMAL LOW (ref >60)
Glucose, Bld: 122 mg/dL — ABNORMAL HIGH (ref 70–99)
Potassium: 4.5 mmol/L (ref 3.5–5.1)
Sodium: 140 mmol/L (ref 135–145)

## 2020-03-23 NOTE — Assessment & Plan Note (Addendum)
#   ITP-Steroid responsive.  Currently on surveillance.  Platelets of 167Continue surveillance.   # IDA-  Mild anemia/likely from CKD- Hb 12.2- recommend PO iron.  STABLE.    # CKD-stage III- STABLE.  #Seizures -stable off keppra.   # left Le infection under wound care-   #  DISPOSITION:  # follow up in 6 months- MD;  [cbcbmp/]- Dr.B   Dr.Bliss

## 2020-03-23 NOTE — Progress Notes (Signed)
[place] OFFICE PROGRESS NOTE  Patient Care Team: Lynnell Jude, MD as PCP - General (Family Medicine) Kate Sable, MD as PCP - Cardiology (Cardiology)   SUMMARY OF ONCOLOGIC HISTORY:  # SEP 2015- ITP [platelets- 16]; s/p prednisone ; Good Response; currently surveillance  # SEP 2015- Mild Anemia/IDA-CKD-III- ~12 s/p EGD/colo [Oct 2015; Dr.Skulskie] ; Ferritin- 17; NO BMBx;   # CKD creatinine ~1.4 [no nephro] ; 2021-new onset of Seziues   INTERVAL HISTORY:  86 year old male patient with history of ITP-is here for follow-up/currently on surveillance.  States that she was recently diagnosed with seizures; of unclear etiology.  However currently stable of Keppra.  Denies any blood in stools or black or stools.  No easy bruising.  Review of Systems  Constitutional: Negative for chills, diaphoresis, fever and malaise/fatigue.  HENT: Negative for nosebleeds and sore throat.   Eyes: Negative for double vision.  Respiratory: Negative for cough, hemoptysis, sputum production, shortness of breath and wheezing.   Cardiovascular: Negative for chest pain, palpitations, orthopnea and leg swelling.  Gastrointestinal: Negative for abdominal pain, blood in stool, constipation, diarrhea, heartburn, melena, nausea and vomiting.  Genitourinary: Negative for dysuria.  Musculoskeletal: Negative for back pain and joint pain.  Skin: Negative.  Negative for itching and rash.  Neurological: Negative for dizziness, tingling, focal weakness, weakness and headaches.  Endo/Heme/Allergies: Does not bruise/bleed easily.  Psychiatric/Behavioral: Negative for depression. The patient is not nervous/anxious and does not have insomnia.      PAST MEDICAL HISTORY :  Past Medical History:  Diagnosis Date  . Allergic rhinitis   . Arthropathy   . B12 deficiency anemia   . Chronic neck pain   . CVA (cerebral infarction)   . GERD (gastroesophageal reflux disease)   . History of seizures    . HTN (hypertension)   . IDA (iron deficiency anemia)   . ITP (idiopathic thrombocytopenic purpura) 07/16/14    PAST SURGICAL HISTORY :   Past Surgical History:  Procedure Laterality Date  . CHOLECYSTECTOMY  10/13/1988  . COLONOSCOPY  10/2014, 08/2014   Dr. Gwenlyn Perking  . UPPER GI ENDOSCOPY      FAMILY HISTORY :   Family History  Problem Relation Age of Onset  . Heart disease Other   . Cirrhosis Mother     SOCIAL HISTORY:   Social History   Tobacco Use  . Smoking status: Never Smoker  . Smokeless tobacco: Never Used  Substance Use Topics  . Alcohol use: No    Alcohol/week: 0.0 standard drinks  . Drug use: No    ALLERGIES:  is allergic to meperidine.  MEDICATIONS:  Current Outpatient Medications  Medication Sig Dispense Refill  . acetaminophen (TYLENOL) 500 MG tablet Take 500 mg by mouth every 4 (four) hours as needed.     Marland Kitchen aspirin EC 81 MG tablet Take 81 mg by mouth daily.    Marland Kitchen nystatin ointment (MYCOSTATIN) APPLY OINTMENT TOPICALLY TWICE DAILY    . Probiotic Product ([place]) Take by mouth daily.    . RABEprazole (ACIPHEX) 20 MG tablet Take 1 tablet by mouth daily. 1 hour before meals    . triamterene-hydrochlorothiazide (DYAZIDE) 37.5-25 MG capsule Take 1 capsule by mouth as directed. Take if systolic BP >893.     No current facility-administered medications for this visit.    PHYSICAL EXAMINATION:   BP 136/77 (BP Location: Left Arm, Patient Position: Sitting)   Pulse 72   Temp (!) 95.5 F (35.3 C) (Tympanic)  Resp 18   Wt 175 lb 6.4 oz (79.6 kg)   SpO2 100%   BMI 25.17 kg/m   Filed Weights   03/23/20 1336  Weight: 175 lb 6.4 oz (79.6 kg)    Physical Exam  Constitutional: He is oriented to person, place, and time and well-developed, well-nourished, and in no distress.  He is walking himself.  HENT:  Head: Normocephalic and atraumatic.  Mouth/Throat: Oropharynx is clear and moist. No oropharyngeal exudate.  Eyes: Pupils are  equal, round, and reactive to light.  Cardiovascular: Normal rate and regular rhythm.  Pulmonary/Chest: No respiratory distress. He has no wheezes.  Abdominal: Soft. Bowel sounds are normal. He exhibits no distension and no mass. There is no abdominal tenderness. There is no rebound and no guarding.  Musculoskeletal:        General: No tenderness or edema. Normal range of motion.     Cervical back: Normal range of motion and neck supple.  Neurological: He is alert and oriented to person, place, and time.  Skin: Skin is warm.  Psychiatric: Affect normal.    LABORATORY DATA:  I have reviewed the data as listed    Component Value Date/Time   NA 140 03/23/2020 1257   NA 137 02/24/2015 1131   K 4.5 03/23/2020 1257   K 4.1 02/24/2015 1131   CL 105 03/23/2020 1257   CL 103 02/24/2015 1131   CO2 27 03/23/2020 1257   CO2 29 02/24/2015 1131   GLUCOSE 122 (H) 03/23/2020 1257   GLUCOSE 135 (H) 02/24/2015 1131   BUN 21 03/23/2020 1257   BUN 14 02/24/2015 1131   CREATININE 1.32 (H) 03/23/2020 1257   CREATININE 1.32 (H) 02/24/2015 1131   CALCIUM 8.3 (L) 03/23/2020 1257   CALCIUM 8.8 (L) 02/24/2015 1131   PROT 6.7 11/30/2019 0449   PROT 6.5 02/24/2015 1131   ALBUMIN 3.8 11/30/2019 0449   ALBUMIN 3.9 02/24/2015 1131   AST 23 11/30/2019 0449   AST 19 02/24/2015 1131   ALT 13 11/30/2019 0449   ALT 11 (L) 02/24/2015 1131   ALKPHOS 45 11/30/2019 0449   ALKPHOS 43 02/24/2015 1131   BILITOT 0.7 11/30/2019 0449   BILITOT 0.9 02/24/2015 1131   GFRNONAA 49 (L) 03/23/2020 1257   GFRNONAA 50 (L) 02/24/2015 1131   GFRAA 56 (L) 03/23/2020 1257   GFRAA 58 (L) 02/24/2015 1131    No results found for: SPEP, UPEP  Lab Results  Component Value Date   WBC 8.4 03/23/2020   NEUTROABS 6.1 03/23/2020   HGB 11.3 (L) 03/23/2020   HCT 34.3 (L) 03/23/2020   MCV 91.2 03/23/2020   PLT 167 03/23/2020      Chemistry      Component Value Date/Time   NA 140 03/23/2020 1257   NA 137 02/24/2015 1131    K 4.5 03/23/2020 1257   K 4.1 02/24/2015 1131   CL 105 03/23/2020 1257   CL 103 02/24/2015 1131   CO2 27 03/23/2020 1257   CO2 29 02/24/2015 1131   BUN 21 03/23/2020 1257   BUN 14 02/24/2015 1131   CREATININE 1.32 (H) 03/23/2020 1257   CREATININE 1.32 (H) 02/24/2015 1131      Component Value Date/Time   CALCIUM 8.3 (L) 03/23/2020 1257   CALCIUM 8.8 (L) 02/24/2015 1131   ALKPHOS 45 11/30/2019 0449   ALKPHOS 43 02/24/2015 1131   AST 23 11/30/2019 0449   AST 19 02/24/2015 1131   ALT 13 11/30/2019 0449   ALT 11 (  L) 02/24/2015 1131   BILITOT 0.7 11/30/2019 0449   BILITOT 0.9 02/24/2015 1131        ASSESSMENT & PLAN:   Idiopathic thrombocytopenic purpura ([place]) # ITP-Steroid responsive.  Currently on surveillance.  Platelets of 167Continue surveillance.   # IDA-  Mild anemia/likely from CKD- Hb 12.2- recommend PO iron.  STABLE.    # CKD-stage III- STABLE.  #Seizures -stable off keppra.   # left Le infection under wound care-   #  DISPOSITION:  # follow up in 6 months- MD;  [cbcbmp/]- Dr.B   Dr.Bliss    Cammie Sickle, MD 04/01/2020 5:16 PM

## 2020-03-23 NOTE — Progress Notes (Signed)
Pt and wife in for 6 month following up today. States "had a rough January and February had a seizure and just finished up being treated for it".   Pt also has bilateral lower extremity swelling and has a wound on left lower leg which he goes to the wound center once a week to have dressing changes.

## 2020-03-25 ENCOUNTER — Other Ambulatory Visit: Payer: Self-pay

## 2020-03-25 ENCOUNTER — Encounter: Payer: PPO | Admitting: Internal Medicine

## 2020-03-25 DIAGNOSIS — L97822 Non-pressure chronic ulcer of other part of left lower leg with fat layer exposed: Secondary | ICD-10-CM | POA: Diagnosis not present

## 2020-03-25 DIAGNOSIS — I872 Venous insufficiency (chronic) (peripheral): Secondary | ICD-10-CM | POA: Diagnosis not present

## 2020-03-25 DIAGNOSIS — I87333 Chronic venous hypertension (idiopathic) with ulcer and inflammation of bilateral lower extremity: Secondary | ICD-10-CM | POA: Diagnosis not present

## 2020-03-26 NOTE — Progress Notes (Signed)
CORNELIS, KLUVER ([id number]) Visit Report for 03/25/2020 Arrival Information Details Patient Name: ARISTIDIS, TALERICO. Date of Service: 03/25/2020 1:30 PM Medical Record Number: [id number] Patient Account Number: [id number] Date of Birth/Sex: 1932-02-23 (86 y.o. M) Treating RN: [name] Primary Care Kordae Buonocore: Lavera Guise Other Clinician: Referring Rajesh Wyss: Lavera Guise Treating Rossanna Spitzley/Extender: Tito Dine in Treatment: 3 Visit Information History Since Last Visit Added or deleted any medications: No Patient Arrived: Ambulatory Any new allergies or adverse reactions: No Arrival Time: 13:36 Had a fall or experienced change in No Accompanied By: wife activities of daily living that may affect Transfer Assistance: None risk of falls: Patient Identification Verified: Yes Signs or symptoms of abuse/neglect since last visito No Secondary Verification Process Completed: Yes Hospitalized since last visit: No Patient Has Alerts: Yes Implantable device outside of the clinic excluding No Patient Alerts: Patient on Blood Thinner cellular tissue based products placed in the center Aspirin 325 since last visit: Has Dressing in Place as Prescribed: Yes Pain Present Now: No Electronic Signature(s) Signed: 03/25/2020 4:38:04 PM By: Lorine Bears RCP, RRT, CHT Entered By: Becky Sax, Amado Nash on 03/25/2020 13:37:20 Rossman, [name] ([id number]) -------------------------------------------------------------------------------- Compression Therapy Details Patient Name: [name] Date of Service: 03/25/2020 1:30 PM Medical Record Number: [id number] Patient Account Number: [id number] Date of Birth/Sex: 15-Mar-1932 (86 y.o. M) Treating RN: [name] Primary Care Greco Gastelum: Lavera Guise Other Clinician: Referring Airabella Barley: Lavera Guise Treating Wynetta Seith/Extender: Tito Dine in Treatment: 3 Compression Therapy Performed for Wound Assessment: Wound  #3 Left,Lateral Lower Leg Performed By: Clinician [name], RN Compression Type: Three Layer Post Procedure Diagnosis Same as Pre-procedure Electronic Signature(s) Signed: 03/25/2020 4:45:22 PM By: [name] Entered By: [name] on 03/25/2020 14:19:07 Presti, [name] ([id number]) -------------------------------------------------------------------------------- Encounter Discharge Information Details Patient Name: CARSTEN, CARSTARPHEN B. Date of Service: 03/25/2020 1:30 PM Medical Record Number: [id number] Patient Account Number: [id number] Date of Birth/Sex: 1932/02/11 (86 y.o. M) Treating RN: [name] Primary Care Brenda Cowher: Lavera Guise Other Clinician: Referring Jamol Ginyard: Lavera Guise Treating Kenetra Hildenbrand/Extender: Tito Dine in Treatment: 3 Encounter Discharge Information Items Post Procedure Vitals Discharge Condition: Stable Temperature (F): 98.5 Ambulatory Status: Ambulatory Pulse (bpm): 62 Discharge Destination: Home Respiratory Rate (breaths/min): 16 Transportation: Private Auto Blood Pressure (mmHg): 174/62 Accompanied By: wife Schedule Follow-up Appointment: No Clinical Summary of Care: Electronic Signature(s) Signed: 03/25/2020 4:45:22 PM By: [name] Entered By: [name] on 03/25/2020 14:21:19 Vandergrift, [name] ([id number]) -------------------------------------------------------------------------------- Lower Extremity Assessment Details Patient Name: [name] Date of Service: 03/25/2020 1:30 PM Medical Record Number: [id number] Patient Account Number: [id number] Date of Birth/Sex: 02-12-1932 (86 y.o. M) Treating RN: Montey Hora Primary Care Joya Willmott: Lavera Guise Other Clinician: Referring Darivs Lunden: Lavera Guise Treating Melisia Leming/Extender: Tito Dine in Treatment: 3 Edema Assessment Assessed: [Left: No] [Right: No] Edema: [Left: Ye] [Right: s] Calf Left: Right: Point of Measurement: 32 cm From Medial Instep 35 cm  cm Ankle Left: Right: Point of Measurement: 10 cm From Medial Instep 28.5 cm cm Vascular Assessment Pulses: Dorsalis Pedis Palpable: [Left:Yes] Electronic Signature(s) Signed: 03/25/2020 5:03:46 PM By: Montey Hora Entered By: Montey Hora on 03/25/2020 13:47:40 Perrault, [name] ([id number]) -------------------------------------------------------------------------------- Multi Wound Chart Details Patient Name: [name] Date of Service: 03/25/2020 1:30 PM Medical Record Number: [id number] Patient Account Number: [id number] Date of Birth/Sex: 07-22-1932 (86 y.o. M) Treating RN: [name] Primary Care Lenola Lockner: Lavera Guise Other Clinician: Referring Zelena Bushong: Lavera Guise Treating Kawthar Ennen/Extender: Tito Dine in Treatment: 3 Vital  Signs Height(in): 69 Pulse(bpm): 62 Weight(lbs): 173 Blood Pressure(mmHg): 174/62 Body Mass Index(BMI): 26 Temperature(F): 98.5 Respiratory Rate(breaths/min): 16 Photos: [N/A:N/A] Wound Location: Left, Lateral Lower Leg N/A N/A Wounding Event: Gradually Appeared N/A N/A Primary Etiology: Venous Leg Ulcer N/A N/A Comorbid History: Hypertension, End Stage Renal N/A N/A Disease, Osteoarthritis, Seizure Disorder Date Acquired: 02/19/2020 N/A N/A Weeks of Treatment: 3 N/A N/A Wound Status: Open N/A N/A Wound Recurrence: Yes N/A N/A Measurements L x W x D (cm) 1.4x0.5x0.1 N/A N/A Area (cm) : 0.55 N/A N/A Volume (cm) : 0.055 N/A N/A % Reduction in Area: 6.60% N/A N/A % Reduction in Volume: 6.80% N/A N/A Classification: Full Thickness Without Exposed N/A N/A Support Structures Exudate Amount: Medium N/A N/A Exudate Type: Serous N/A N/A Exudate Color: amber N/A N/A Wound Margin: Flat and Intact N/A N/A Granulation Amount: Medium (34-66%) N/A N/A Granulation Quality: Pink N/A N/A Necrotic Amount: Medium (34-66%) N/A N/A Exposed Structures: Fat Layer (Subcutaneous Tissue) N/A N/A Exposed: Yes Fascia: No Tendon:  No Muscle: No Joint: No Bone: No Epithelialization: None N/A N/A Debridement: Debridement - Selective/Open N/A N/A Wound Pre-procedure Verification/Time 14:15 N/A N/A Out Taken: Pain Control: Lidocaine N/A N/A Level: Non-Viable Tissue N/A N/A Debridement Area (sq cm): 0.7 N/A N/A Instrument: Curette N/A N/A Bleeding: Minimum N/A N/A Hemostasis Achieved: Pressure N/A N/A Procedure was tolerated well N/A N/A YOSTIN, MALACARA B. ([id number]) Debridement Treatment Response: Post Debridement 1.4x0.5x0.1 N/A N/A Measurements L x W x D (cm) Post Debridement Volume: 0.055 N/A N/A (cm) Procedures Performed: Compression Therapy N/A N/A Debridement Treatment Notes Wound #3 (Left, Lateral Lower Leg) Notes prisma, ABD, 3LL Electronic Signature(s) Signed: 03/25/2020 5:28:47 PM By: Linton Ham MD Entered By: Linton Ham on 03/25/2020 15:02:21 Kempton, [name] ([id number]) -------------------------------------------------------------------------------- Multi-Disciplinary Care Plan Details Patient Name: AMATO, SEVILLANO B. Date of Service: 03/25/2020 1:30 PM Medical Record Number: [id number] Patient Account Number: [id number] Date of Birth/Sex: 05/02/1932 (86 y.o. M) Treating RN: [name] Primary Care Aryeh Butterfield: Lavera Guise Other Clinician: Referring Deniah Saia: Lavera Guise Treating Xavious Sharrar/Extender: Tito Dine in Treatment: 3 Active Inactive Orientation to the Wound Care Program Nursing Diagnoses: Knowledge deficit related to the wound healing center program Goals: Patient/caregiver will verbalize understanding of the [place] Program Date Initiated: 03/04/2020 Target Resolution Date: 04/11/2020 Goal Status: Active Interventions: Provide education on orientation to the wound center Notes: Venous Leg Ulcer Nursing Diagnoses: Knowledge deficit related to disease process and management Goals: Patient/caregiver will verbalize understanding of disease  process and disease management Date Initiated: 03/04/2020 Target Resolution Date: 04/11/2020 Goal Status: Active Interventions: Assess peripheral edema status every visit. Notes: Wound/Skin Impairment Nursing Diagnoses: Impaired tissue integrity Goals: Ulcer/skin breakdown will have a volume reduction of 30% by week 4 Date Initiated: 03/04/2020 Target Resolution Date: 03/31/2020 Goal Status: Active Interventions: Assess ulceration(s) every visit Notes: Electronic Signature(s) Signed: 03/25/2020 4:45:22 PM By: [name] Entered By: [name] on 03/25/2020 14:16:23 Herringshaw, [name] ([id number]) -------------------------------------------------------------------------------- Pain Assessment Details Patient Name: [name] Date of Service: 03/25/2020 1:30 PM Medical Record Number: [id number] Patient Account Number: [id number] Date of Birth/Sex: 09/11/1932 (86 y.o. M) Treating RN: Montey Hora Primary Care Harjas Biggins: Lavera Guise Other Clinician: Referring Hilbert Briggs: Lavera Guise Treating Delron Comer/Extender: Tito Dine in Treatment: 3 Active Problems Location of Pain Severity and Description of Pain Patient Has Paino No Site Locations Pain Management and Medication Current Pain Management: Electronic Signature(s) Signed: 03/25/2020 5:03:46 PM By: Montey Hora Entered By: Montey Hora on 03/25/2020 13:42:46 Gagen, Tadhg B. (  [id number]) -------------------------------------------------------------------------------- Patient/Caregiver Education Details Patient Name: HARITH, MCCADDEN B. Date of Service: 03/25/2020 1:30 PM Medical Record Number: [id number] Patient Account Number: [id number] Date of Birth/Gender: December 19, 1931 (86 y.o. M) Treating RN: [name] Primary Care Physician: Lavera Guise Other Clinician: Referring Physician: Lavera Guise Treating Physician/Extender: Tito Dine in Treatment: 3 Education Assessment Education Provided  To: Patient Education Topics Provided Wound/Skin Impairment: Handouts: Caring for Your Ulcer Methods: Demonstration, Explain/Verbal Responses: State content correctly Electronic Signature(s) Signed: 03/25/2020 4:45:22 PM By: [name] Entered By: [name] on 03/25/2020 14:20:41 Malveaux, [name] ([id number]) -------------------------------------------------------------------------------- Wound Assessment Details Patient Name: [name] Date of Service: 03/25/2020 1:30 PM Medical Record Number: [id number] Patient Account Number: [id number] Date of Birth/Sex: [birth year] (86 y.o. M) Treating RN: Montey Hora Primary Care Luwanda Starr: Lavera Guise Other Clinician: Referring Domenica Weightman: Lavera Guise Treating Jaterrius Ricketson/Extender: Tito Dine in Treatment: 3 Wound Status Wound Number: 3 Primary Venous Leg Ulcer Etiology: Wound Location: Left, Lateral Lower Leg Wound Status: Open Wounding Event: Gradually Appeared Comorbid Hypertension, End Stage Renal Disease, Osteoarthritis, Date Acquired: 02/19/2020 History: Seizure Disorder Weeks Of Treatment: 3 Clustered Wound: No Photos Wound Measurements Length: (cm) 1.4 Width: (cm) 0.5 Depth: (cm) 0.1 Area: (cm) 0.55 Volume: (cm) 0.055 % Reduction in Area: 6.6% % Reduction in Volume: 6.8% Epithelialization: None Tunneling: No Undermining: No Wound Description Classification: Full Thickness Without Exposed Support Structu Wound Margin: Flat and Intact Exudate Amount: Medium Exudate Type: Serous Exudate Color: amber res Foul Odor After Cleansing: No Slough/Fibrino Yes Wound Bed Granulation Amount: Medium (34-66%) Exposed Structure Granulation Quality: Pink Fascia Exposed: No Necrotic Amount: Medium (34-66%) Fat Layer (Subcutaneous Tissue) Exposed: Yes Necrotic Quality: Adherent Slough Tendon Exposed: No Muscle Exposed: No Joint Exposed: No Bone Exposed: No Treatment Notes Wound #3 (Left, Lateral Lower  Leg) Notes prisma, ABD, 3LL Electronic Signature(s) Signed: 03/25/2020 5:03:46 PM By: Durene Fruits ([id number]) Entered By: Montey Hora on 03/25/2020 13:47:15 [place], [name] ([id number]) -------------------------------------------------------------------------------- [place] Details Patient Name: [name] Date of Service: 03/25/2020 1:30 PM Medical Record Number: [id number] Patient Account Number: [id number] Date of Birth/Sex: Feb 09, 1932 (86 y.o. M) Treating RN: [name] Primary Care Zakarie Sturdivant: Lavera Guise Other Clinician: Referring Thi Klich: Lavera Guise Treating Bernarr Longsworth/Extender: Tito Dine in Treatment: 3 Vital Signs Time Taken: 13:35 Temperature (F): 98.5 Height (in): 69 Pulse (bpm): 62 Weight (lbs): 173 Respiratory Rate (breaths/min): 16 Body Mass Index (BMI): 25.5 Blood Pressure (mmHg): 174/62 Reference Range: 80 - 120 mg / dl Electronic Signature(s) Signed: 03/25/2020 4:38:04 PM By: Lorine Bears RCP, RRT, CHT Entered By: Lorine Bears on 03/25/2020 13:38:03

## 2020-03-26 NOTE — Progress Notes (Signed)
[name], [name] ([id number]) Visit Report for 03/25/2020 Debridement Details Patient Name: [name], [name]. Date of Service: 03/25/2020 1:30 PM Medical Record Number: [id number] Patient Account Number: [id number] Date of Birth/Sex: Jan 26, 1932 (86 y.o. M) Treating RN: [name] Primary Care Provider: Lavera Guise Other Clinician: Referring Provider: Lavera Guise Treating Provider/Extender: Tito Dine in Treatment: 3 Debridement Performed for Wound #3 Left,Lateral Lower Leg Assessment: Performed By: Physician Ricard Dillon, MD Debridement Type: Debridement Severity of Tissue Pre Debridement: Fat layer exposed Level of Consciousness (Pre- Awake and Alert procedure): Pre-procedure Verification/Time Out Yes - 14:15 Taken: Start Time: 14:16 Pain Control: Lidocaine Total Area Debrided (L x W): 1.4 (cm) x 0.5 (cm) = 0.7 (cm) Tissue and other material Viable, Non-Viable, Subcutaneous debrided: Level: Skin/Subcutaneous Tissue Debridement Description: Excisional Instrument: Curette Bleeding: Minimum Hemostasis Achieved: Pressure End Time: 14:18 Response to Treatment: Procedure was tolerated well Level of Consciousness (Post- Awake and Alert procedure): Post Debridement Measurements of Total Wound Length: (cm) 1.4 Width: (cm) 0.5 Depth: (cm) 0.1 Volume: (cm) 0.055 Character of Wound/Ulcer Post Debridement: Stable Severity of Tissue Post Debridement: Fat layer exposed Post Procedure Diagnosis Same as Pre-procedure Electronic Signature(s) Signed: 03/25/2020 4:45:22 PM By: [name] Signed: 03/25/2020 5:28:47 PM By: Linton Ham MD Entered By: Linton Ham on 03/25/2020 [place] ([id number]) -------------------------------------------------------------------------------- HPI Details Patient Name: [name] B. Date of Service: 03/25/2020 1:30 PM Medical Record Number: [id number] Patient Account Number: [id number] Date of Birth/Sex:  1932-09-11 (86 y.o. M) Treating RN: [name] Primary Care Provider: Lavera Guise Other Clinician: Referring Provider: Lavera Guise Treating Provider/Extender: Tito Dine in Treatment: 3 History of Present Illness HPI Description: ADMISSION 03/04/2020 Patient is an 86 year old man who arrives accompanied by his wife. He states that he has had wounds on his bilateral lower extremities that started with increased swelling in his lower extremities. He has quite significant bilateral lower extremity pitting edema but he states that this is only been there for 2 or 3 weeks. Says he saw his primary doctor who put him on what sounds like a diuretic. He has developed small wounds on the left lateral which is actually the most prominent wound. Very small superficial wounds on the left anterior and a cluster of very small wounds on the right lateral. These are not particularly painful although he states that his legs are itchy. on arrival in the clinic our intake nurse could not obtain pulses bilaterally. She could not maintain of enough of a Doppler in the right dorsalis pedis to get an ABI and on the left she could not even obtain it by Doppler. The patient does have pain in his legs when he walks. He says 10 to 15 minutes he has to sit down and rest although he thought that this was because of his knees. He also mentions that his wounds are painful at night when he is lying in bed sometimes waking him up at 5:00 in the morning. Past medical history includes hypertension, CVA in 2011 with no residual effects, osteoarthritis of both knees, recent seizure disorder but apparently complete work-up was negative, ITP, vitamin B12 deficiency and stage III chronic renal failure We could not obtain ABIs in our clinic as noted above 03/11/2020 upon evaluation today patient actually appears to be doing much better in regard to his bilateral lower extremities. Fortunately there is no signs of  infection. He actually saw Dr. Dellia Nims during his initial visit last week and he was treated very appropriately his  wounds appear to be doing much better. Also don't have the complete evaluation as far as his arterial study is concerned that the vascular doctor has not read it yet. With that being said it did appear that the patient's findings were consistent with being okay. His TBI's were somewhat low at 0.39 on the right and 0.49 on the left with an ABI of 1.15 on the right and 1.35 on the left. With that being said I think that the fact that he is healing and has tolerated the wrap very well at this point is a good indication that he is good to do just fine. 03/18/2020 upon evaluation today patient appears to be doing pretty well in regard to his leg ulcer on the left. Fortunately there is no signs of active infection at this time. No fevers, chills, nausea, vomiting, or diarrhea. I am actually rather pleased with how things seem to be progressing. Overall the patient's wound I think is ready for collagen I do not believe there is enough drainage to warrant having to continue with the alginate at this point. 5/13; small wound on the left lateral lower leg. We are using silver collagen. Not much improvement this week in surface area or condition of the wound. Wrapping and kerlix Coban. Previous ABI was noncompressible Electronic Signature(s) Signed: 03/25/2020 5:28:47 PM By: Linton Ham MD Entered By: Linton Ham on 03/25/2020 15:03:38 Hoying, [name] ([id number]) -------------------------------------------------------------------------------- Physical Exam Details Patient Name: [name], [name] B. Date of Service: 03/25/2020 1:30 PM Medical Record Number: [id number] Patient Account Number: [id number] Date of Birth/Sex: June 11, 1932 (86 y.o. M) Treating RN: [name] Primary Care Provider: Lavera Guise Other Clinician: Referring Provider: Lavera Guise Treating Provider/Extender: Tito Dine in Treatment: 3 Constitutional Patient is hypertensive.. Pulse regular and within target range for patient.Marland Kitchen Respirations regular, non-labored and within target range.. Temperature is normal and within the target range for the patient.Marland Kitchen appears in no distress. Cardiovascular Pedal pulses were palpable at the dorsalis pedis. Poorly controlled probable lymphedema. Notes Wound exam; left lateral lower extremity. Wound is not much better in terms of surface area and surface is completely nonviable. Debrided with a #3 curette very gritty wound surface also rolled senescent edges. Both of these debrided hemostasis with direct pressure. There is no evidence of surrounding infection Electronic Signature(s) Signed: 03/25/2020 5:28:47 PM By: Linton Ham MD Entered By: Linton Ham on 03/25/2020 15:04:52 Orndoff, [name] ([id number]) -------------------------------------------------------------------------------- Physician Orders Details Patient Name: BERKLEY, CRONKRIGHT B. Date of Service: 03/25/2020 1:30 PM Medical Record Number: [id number] Patient Account Number: [id number] Date of Birth/Sex: 1932-09-02 (86 y.o. M) Treating RN: [name] Primary Care Provider: Lavera Guise Other Clinician: Referring Provider: Lavera Guise Treating Provider/Extender: Tito Dine in Treatment: 3 Verbal / Phone Orders: No Diagnosis Coding Wound Cleansing Wound #3 Left,Lateral Lower Leg o Clean wound with Normal Saline. Primary Wound Dressing Wound #3 Left,Lateral Lower Leg o Silver Collagen - moisten with hydrogel Secondary Dressing Wound #3 Left,Lateral Lower Leg o ABD pad Dressing Change Frequency Wound #3 Left,Lateral Lower Leg o Change dressing every week Follow-up Appointments Wound #3 Left,Lateral Lower Leg o Return Appointment in 1 week. Edema Control Wound #3 Left,Lateral Lower Leg o 3 Layer Compression System - Left Lower Extremity o Elevate legs to  the level of the heart and pump ankles as often as possible Electronic Signature(s) Signed: 03/25/2020 4:45:22 PM By: [name] Signed: 03/25/2020 5:28:47 PM By: Linton Ham MD Entered By: [name] on 03/25/2020 14:20:05 Deneault,  ABRAHAN FULMORE ([id number]) -------------------------------------------------------------------------------- Problem List Details Patient Name: TRISTRAM, MILIAN B. Date of Service: 03/25/2020 1:30 PM Medical Record Number: [id number] Patient Account Number: [id number] Date of Birth/Sex: 05-Mar-1932 (86 y.o. M) Treating RN: [name] Primary Care Provider: Lavera Guise Other Clinician: Referring Provider: Lavera Guise Treating Provider/Extender: Tito Dine in Treatment: 3 Active Problems ICD-10 Encounter Code Description Active Date MDM Diagnosis I87.333 Chronic venous hypertension (idiopathic) with ulcer and inflammation of 03/04/2020 No Yes bilateral lower extremity L97.821 Non-pressure chronic ulcer of other part of left lower leg limited to 03/04/2020 No Yes breakdown of skin L97.811 Non-pressure chronic ulcer of other part of right lower leg limited to 03/04/2020 No Yes breakdown of skin I70.213 Atherosclerosis of native arteries of extremities with intermittent 03/04/2020 No Yes claudication, bilateral legs Inactive Problems Resolved Problems Electronic Signature(s) Signed: 03/25/2020 5:28:47 PM By: Linton Ham MD Entered By: Linton Ham on 03/25/2020 15:02:15 Pfiester, [name] ([id number]) -------------------------------------------------------------------------------- Progress Note Details Patient Name: [name] B. Date of Service: 03/25/2020 1:30 PM Medical Record Number: [id number] Patient Account Number: [id number] Date of Birth/Sex: December 09, 1931 (86 y.o. M) Treating RN: [name] Primary Care Provider: Lavera Guise Other Clinician: Referring Provider: Lavera Guise Treating Provider/Extender: Tito Dine in  Treatment: 3 Subjective History of Present Illness (HPI) ADMISSION 03/04/2020 Patient is an 86 year old man who arrives accompanied by his wife. He states that he has had wounds on his bilateral lower extremities that started with increased swelling in his lower extremities. He has quite significant bilateral lower extremity pitting edema but he states that this is only been there for 2 or 3 weeks. Says he saw his primary doctor who put him on what sounds like a diuretic. He has developed small wounds on the left lateral which is actually the most prominent wound. Very small superficial wounds on the left anterior and a cluster of very small wounds on the right lateral. These are not particularly painful although he states that his legs are itchy. on arrival in the clinic our intake nurse could not obtain pulses bilaterally. She could not maintain of enough of a Doppler in the right dorsalis pedis to get an ABI and on the left she could not even obtain it by Doppler. The patient does have pain in his legs when he walks. He says 10 to 15 minutes he has to sit down and rest although he thought that this was because of his knees. He also mentions that his wounds are painful at night when he is lying in bed sometimes waking him up at 5:00 in the morning. Past medical history includes hypertension, CVA in 2011 with no residual effects, osteoarthritis of both knees, recent seizure disorder but apparently complete work-up was negative, ITP, vitamin B12 deficiency and stage III chronic renal failure We could not obtain ABIs in our clinic as noted above 03/11/2020 upon evaluation today patient actually appears to be doing much better in regard to his bilateral lower extremities. Fortunately there is no signs of infection. He actually saw Dr. Dellia Nims during his initial visit last week and he was treated very appropriately his wounds appear to be doing much better. Also don't have the complete evaluation as far  as his arterial study is concerned that the vascular doctor has not read it yet. With that being said it did appear that the patient's findings were consistent with being okay. His TBI's were somewhat low at 0.39 on the right and 0.49 on the left with an ABI of  1.15 on the right and 1.35 on the left. With that being said I think that the fact that he is healing and has tolerated the wrap very well at this point is a good indication that he is good to do just fine. 03/18/2020 upon evaluation today patient appears to be doing pretty well in regard to his leg ulcer on the left. Fortunately there is no signs of active infection at this time. No fevers, chills, nausea, vomiting, or diarrhea. I am actually rather pleased with how things seem to be progressing. Overall the patient's wound I think is ready for collagen I do not believe there is enough drainage to warrant having to continue with the alginate at this point. 5/13; small wound on the left lateral lower leg. We are using silver collagen. Not much improvement this week in surface area or condition of the wound. Wrapping and kerlix Coban. Previous ABI was noncompressible Objective Constitutional Patient is hypertensive.. Pulse regular and within target range for patient.Marland Kitchen Respirations regular, non-labored and within target range.. Temperature is normal and within the target range for the patient.Marland Kitchen appears in no distress. Vitals Time Taken: 1:35 PM, Height: 69 in, Weight: 173 lbs, BMI: 25.5, Temperature: 98.5 F, Pulse: 62 bpm, Respiratory Rate: 16 breaths/min, Blood Pressure: 174/62 mmHg. Cardiovascular Pedal pulses were palpable at the dorsalis pedis. Poorly controlled probable lymphedema. General Notes: Wound exam; left lateral lower extremity. Wound is not much better in terms of surface area and surface is completely nonviable. Debrided with a #3 curette very gritty wound surface also rolled senescent edges. Both of these debrided hemostasis  with direct pressure. There is no evidence of surrounding infection Integumentary (Hair, Skin) Wound #3 status is Open. Original cause of wound was Gradually Appeared. The wound is located on the Left,Lateral Lower Leg. The wound measures 1.4cm length x 0.5cm width x 0.1cm depth; 0.55cm^2 area and 0.055cm^3 volume. There is Fat Layer (Subcutaneous Tissue) Exposed exposed. There is no tunneling or undermining noted. There is a medium amount of serous drainage noted. The wound margin is flat and Petterson, Donya B. ([id number]) intact. There is medium (34-66%) pink granulation within the wound bed. There is a medium (34-66%) amount of necrotic tissue within the wound bed including Adherent Slough. Assessment Active Problems ICD-10 Chronic venous hypertension (idiopathic) with ulcer and inflammation of bilateral lower extremity Non-pressure chronic ulcer of other part of left lower leg limited to breakdown of skin Non-pressure chronic ulcer of other part of right lower leg limited to breakdown of skin Atherosclerosis of native arteries of extremities with intermittent claudication, bilateral legs Procedures Wound #3 Pre-procedure diagnosis of Wound #3 is a Venous Leg Ulcer located on the Left,Lateral Lower Leg .Severity of Tissue Pre Debridement is: Fat layer exposed. There was a Selective/Open Wound Non-Viable Tissue Debridement with a total area of 0.7 sq cm performed by Ricard Dillon, MD. With the following instrument(s): Curette to remove Viable and Non-Viable tissue/material. after achieving pain control using Lidocaine. A time out was conducted at 14:15, prior to the start of the procedure. A Minimum amount of bleeding was controlled with Pressure. The procedure was tolerated well. Post Debridement Measurements: 1.4cm length x 0.5cm width x 0.1cm depth; 0.055cm^3 volume. Character of Wound/Ulcer Post Debridement is stable. Severity of Tissue Post Debridement is: Fat layer exposed. Post  procedure Diagnosis Wound #3: Same as Pre-Procedure Pre-procedure diagnosis of Wound #3 is a Venous Leg Ulcer located on the Left,Lateral Lower Leg . There was a Three Layer Compression Therapy  Procedure by [name], RN. Post procedure Diagnosis Wound #3: Same as Pre-Procedure Plan Wound Cleansing: Wound #3 Left,Lateral Lower Leg: Clean wound with Normal Saline. Primary Wound Dressing: Wound #3 Left,Lateral Lower Leg: Silver Collagen - moisten with hydrogel Secondary Dressing: Wound #3 Left,Lateral Lower Leg: ABD pad Dressing Change Frequency: Wound #3 Left,Lateral Lower Leg: Change dressing every week Follow-up Appointments: Wound #3 Left,Lateral Lower Leg: Return Appointment in 1 week. Edema Control: Wound #3 Left,Lateral Lower Leg: 3 Layer Compression System - Left Lower Extremity Elevate legs to the level of the heart and pump ankles as often as possible 1. Left lateral lower leg wound if no improvement. We continued with the silver collagen however I increased him from 2-3 layer compression. We will have to have better edema control to get the small area to close. 2. I urged him that if this hurt or caused uncomfortable pain he is to take it off and call us 3. Although his ABI was noncompressible on the left I do not think there is a significant arterial issue here Electronic Signature(s) SKANDA, WORLDS ([id number]) Signed: 03/25/2020 5:28:47 PM By: Linton Ham MD Entered By: Linton Ham on 03/25/2020 15:05:53 Derosa, [name] ([id number]) -------------------------------------------------------------------------------- SuperBill Details Patient Name: [name] B. Date of Service: 03/25/2020 Medical Record Number: [id number] Patient Account Number: [id number] Date of Birth/Sex: [birth year] (86 y.o. M) Treating RN: [name] Primary Care Provider: Lavera Guise Other Clinician: Referring Provider: Lavera Guise Treating Provider/Extender: Tito Dine  in Treatment: 3 Diagnosis Coding ICD-10 Codes Code Description [phone] Chronic venous hypertension (idiopathic) with ulcer and inflammation of bilateral lower extremity L97.821 Non-pressure chronic ulcer of other part of left lower leg limited to breakdown of skin L97.811 Non-pressure chronic ulcer of other part of right lower leg limited to breakdown of skin I70.213 Atherosclerosis of native arteries of extremities with intermittent claudication, bilateral legs Facility Procedures CPT4 Code Description: [id number] - DEB SUBQ TISSUE 20 SQ CM/< Modifier: Quantity: 1 CPT4 Code Description: ICD-10 Diagnosis Description I87.333 Chronic venous hypertension (idiopathic) with ulcer and inflammation of bilate L97.821 Non-pressure chronic ulcer of other part of left lower leg limited to breakdow Modifier: ral lower extr n of skin Quantity: emity Physician Procedures CPT4 Code Description: [id number] - WC PHYS SUBQ TISS 20 SQ CM Modifier: Quantity: 1 CPT4 Code Description: ICD-10 Diagnosis Description I87.333 Chronic venous hypertension (idiopathic) with ulcer and inflammation of bilate L97.821 Non-pressure chronic ulcer of other part of left lower leg limited to breakdow Modifier: ral lower extre n of skin Quantity: Financial trader) Signed: 03/25/2020 5:28:47 PM By: Linton Ham MD Entered By: Linton Ham on 03/25/2020 15:06:41

## 2020-03-30 DIAGNOSIS — E039 Hypothyroidism, unspecified: Secondary | ICD-10-CM | POA: Diagnosis not present

## 2020-03-30 DIAGNOSIS — R2990 Unspecified symptoms and signs involving the nervous system: Secondary | ICD-10-CM | POA: Diagnosis not present

## 2020-03-30 DIAGNOSIS — Z20822 Contact with and (suspected) exposure to covid-19: Secondary | ICD-10-CM | POA: Diagnosis not present

## 2020-03-30 DIAGNOSIS — G459 Transient cerebral ischemic attack, unspecified: Secondary | ICD-10-CM | POA: Diagnosis not present

## 2020-03-30 DIAGNOSIS — R4702 Dysphasia: Secondary | ICD-10-CM | POA: Diagnosis not present

## 2020-03-30 DIAGNOSIS — R569 Unspecified convulsions: Secondary | ICD-10-CM | POA: Diagnosis not present

## 2020-03-30 DIAGNOSIS — Z79899 Other long term (current) drug therapy: Secondary | ICD-10-CM | POA: Diagnosis not present

## 2020-03-30 DIAGNOSIS — I493 Ventricular premature depolarization: Secondary | ICD-10-CM | POA: Diagnosis not present

## 2020-03-30 DIAGNOSIS — I6389 Other cerebral infarction: Secondary | ICD-10-CM | POA: Diagnosis not present

## 2020-03-30 DIAGNOSIS — Z8673 Personal history of transient ischemic attack (TIA), and cerebral infarction without residual deficits: Secondary | ICD-10-CM | POA: Diagnosis not present

## 2020-03-30 DIAGNOSIS — I6521 Occlusion and stenosis of right carotid artery: Secondary | ICD-10-CM | POA: Diagnosis not present

## 2020-03-30 DIAGNOSIS — I119 Hypertensive heart disease without heart failure: Secondary | ICD-10-CM | POA: Diagnosis not present

## 2020-03-30 DIAGNOSIS — D693 Immune thrombocytopenic purpura: Secondary | ICD-10-CM | POA: Diagnosis not present

## 2020-03-30 DIAGNOSIS — Z7982 Long term (current) use of aspirin: Secondary | ICD-10-CM | POA: Diagnosis not present

## 2020-03-30 DIAGNOSIS — I10 Essential (primary) hypertension: Secondary | ICD-10-CM | POA: Diagnosis not present

## 2020-03-30 DIAGNOSIS — R531 Weakness: Secondary | ICD-10-CM | POA: Diagnosis not present

## 2020-03-30 DIAGNOSIS — I517 Cardiomegaly: Secondary | ICD-10-CM | POA: Diagnosis not present

## 2020-03-30 DIAGNOSIS — E785 Hyperlipidemia, unspecified: Secondary | ICD-10-CM | POA: Diagnosis not present

## 2020-03-30 DIAGNOSIS — R29810 Facial weakness: Secondary | ICD-10-CM | POA: Diagnosis not present

## 2020-03-31 DIAGNOSIS — I517 Cardiomegaly: Secondary | ICD-10-CM | POA: Diagnosis not present

## 2020-03-31 DIAGNOSIS — I6521 Occlusion and stenosis of right carotid artery: Secondary | ICD-10-CM | POA: Diagnosis not present

## 2020-03-31 DIAGNOSIS — I10 Essential (primary) hypertension: Secondary | ICD-10-CM | POA: Diagnosis not present

## 2020-03-31 DIAGNOSIS — R2990 Unspecified symptoms and signs involving the nervous system: Secondary | ICD-10-CM | POA: Diagnosis not present

## 2020-03-31 DIAGNOSIS — E039 Hypothyroidism, unspecified: Secondary | ICD-10-CM | POA: Diagnosis not present

## 2020-03-31 DIAGNOSIS — G459 Transient cerebral ischemic attack, unspecified: Secondary | ICD-10-CM | POA: Diagnosis not present

## 2020-04-01 ENCOUNTER — Encounter: Payer: PPO | Admitting: Internal Medicine

## 2020-04-01 ENCOUNTER — Other Ambulatory Visit: Payer: Self-pay

## 2020-04-01 DIAGNOSIS — L97822 Non-pressure chronic ulcer of other part of left lower leg with fat layer exposed: Secondary | ICD-10-CM | POA: Diagnosis not present

## 2020-04-01 DIAGNOSIS — I87333 Chronic venous hypertension (idiopathic) with ulcer and inflammation of bilateral lower extremity: Secondary | ICD-10-CM | POA: Diagnosis not present

## 2020-04-01 DIAGNOSIS — I872 Venous insufficiency (chronic) (peripheral): Secondary | ICD-10-CM | POA: Diagnosis not present

## 2020-04-01 MED ORDER — AMLODIPINE BESYLATE 5 MG PO TABS
5.00 | ORAL_TABLET | ORAL | Status: DC
Start: 2020-04-01 — End: 2020-04-01

## 2020-04-01 MED ORDER — ASPIRIN 81 MG PO CHEW
81.00 | CHEWABLE_TABLET | ORAL | Status: DC
Start: 2020-04-01 — End: 2020-04-01

## 2020-04-01 MED ORDER — LEVOTHYROXINE SODIUM 25 MCG PO TABS
25.00 | ORAL_TABLET | ORAL | Status: DC
Start: ? — End: 2020-04-01

## 2020-04-02 NOTE — Progress Notes (Signed)
[name] ([id number]) Visit Report for 04/01/2020 Debridement Details Patient Name: [name], [name]. Date of Service: 04/01/2020 3:00 PM Medical Record Number: [id number] Patient Account Number: [id number] Date of Birth/Sex: September 24, 1932 (86 y.o. M) Treating RN: [name] Primary Care Provider: Lavera Guise Other Clinician: Referring Provider: Lavera Guise Treating Provider/Extender: Tito Dine in Treatment: 4 Debridement Performed for Wound #3 Left,Lateral Lower Leg Assessment: Performed By: Physician Ricard Dillon, MD Debridement Type: Debridement Severity of Tissue Pre Debridement: Fat layer exposed Level of Consciousness (Pre- Awake and Alert procedure): Pre-procedure Verification/Time Out Yes - 15:30 Taken: Start Time: 15:31 Pain Control: Lidocaine Total Area Debrided (L x W): 1.2 (cm) x 0.5 (cm) = 0.6 (cm) Tissue and other material Viable, Non-Viable, Slough, Subcutaneous, Slough debrided: Level: Skin/Subcutaneous Tissue Debridement Description: Excisional Instrument: Curette Bleeding: Minimum Hemostasis Achieved: Pressure End Time: 15:33 Response to Treatment: Procedure was tolerated well Level of Consciousness (Post- Awake and Alert procedure): Post Debridement Measurements of Total Wound Length: (cm) 1.2 Width: (cm) 0.5 Depth: (cm) 0.2 Volume: (cm) 0.094 Character of Wound/Ulcer Post Debridement: Stable Severity of Tissue Post Debridement: Fat layer exposed Post Procedure Diagnosis Same as Pre-procedure Electronic Signature(s) Signed: 04/01/2020 5:18:09 PM By: [name] Signed: 04/01/2020 5:46:06 PM By: Linton Ham MD Entered By: [name] on 04/01/2020 15:33:31 [place], [name] ([id number]) -------------------------------------------------------------------------------- HPI Details Patient Name: [name] Mole B. Date of Service: 04/01/2020 3:00 PM Medical Record Number: [id number] Patient Account Number: [id number] Date of  Birth/Sex: 27-Jan-1932 (86 y.o. M) Treating RN: [name] Primary Care Provider: Lavera Guise Other Clinician: Referring Provider: Lavera Guise Treating Provider/Extender: Tito Dine in Treatment: 4 History of Present Illness HPI Description: ADMISSION 03/04/2020 Patient is an 86 year old man who arrives accompanied by his wife. He states that he has had wounds on his bilateral lower extremities that started with increased swelling in his lower extremities. He has quite significant bilateral lower extremity pitting edema but he states that this is only been there for 2 or 3 weeks. Says he saw his primary doctor who put him on what sounds like a diuretic. He has developed small wounds on the left lateral which is actually the most prominent wound. Very small superficial wounds on the left anterior and a cluster of very small wounds on the right lateral. These are not particularly painful although he states that his legs are itchy. on arrival in the clinic our intake nurse could not obtain pulses bilaterally. She could not maintain of enough of a Doppler in the right dorsalis pedis to get an ABI and on the left she could not even obtain it by Doppler. The patient does have pain in his legs when he walks. He says 10 to 15 minutes he has to sit down and rest although he thought that this was because of his knees. He also mentions that his wounds are painful at night when he is lying in bed sometimes waking him up at 5:00 in the morning. Past medical history includes hypertension, CVA in 2011 with no residual effects, osteoarthritis of both knees, recent seizure disorder but apparently complete work-up was negative, ITP, vitamin B12 deficiency and stage III chronic renal failure We could not obtain ABIs in our clinic as noted above 03/11/2020 upon evaluation today patient actually appears to be doing much better in regard to his bilateral lower extremities. Fortunately there is no signs  of infection. He actually saw Dr. Dellia Nims during his initial visit last week and he was treated very  appropriately his wounds appear to be doing much better. Also don't have the complete evaluation as far as his arterial study is concerned that the vascular doctor has not read it yet. With that being said it did appear that the patient's findings were consistent with being okay. His TBI's were somewhat low at 0.39 on the right and 0.49 on the left with an ABI of 1.15 on the right and 1.35 on the left. With that being said I think that the fact that he is healing and has tolerated the wrap very well at this point is a good indication that he is good to do just fine. 03/18/2020 upon evaluation today patient appears to be doing pretty well in regard to his leg ulcer on the left. Fortunately there is no signs of active infection at this time. No fevers, chills, nausea, vomiting, or diarrhea. I am actually rather pleased with how things seem to be progressing. Overall the patient's wound I think is ready for collagen I do not believe there is enough drainage to warrant having to continue with the alginate at this point. 5/13; small wound on the left lateral lower leg. We are using silver collagen. Not much improvement this week in surface area or condition of the wound. Wrapping and kerlix Coban. Previous ABI was noncompressible 5/20; the patient's wound on the left lateral lower leg is smaller. We have been using silver collagen. I increased him from 2-3 layer compressions last week. Edema control is better Since he was last here he tells me he was in the [facility] for [facility] hours with what sounds like a TIA. I have not checked these records Electronic Signature(s) Signed: 04/01/2020 5:46:06 PM By: Linton Ham MD Entered By: Linton Ham on 04/01/2020 15:59:30 [place], [name] ([id number]) -------------------------------------------------------------------------------- Physical Exam Details Patient  Name: DESHONE, LYSSY B. Date of Service: 04/01/2020 3:00 PM Medical Record Number: [id number] Patient Account Number: [id number] Date of Birth/Sex: Jul 20, 1932 (86 y.o. M) Treating RN: [name] Primary Care Provider: Lavera Guise Other Clinician: Referring Provider: Lavera Guise Treating Provider/Extender: Tito Dine in Treatment: 4 Constitutional Sitting or standing Blood Pressure is within target range for patient.. Pulse regular and within target range for patient.Marland Kitchen Respirations regular, non- labored and within target range.. Temperature is normal and within the target range for the patient.Marland Kitchen appears in no distress. Cardiovascular We have better edema control with 3 layer compression. Notes Wound exam; left lateral lower extremity. Wound is smaller. Still requires debridement with a #3 curette to remove tightly adherent fibrous debris but it cleans up quite nicely and the wound measurements are better. There is no evidence of surrounding infection Electronic Signature(s) Signed: 04/01/2020 5:46:06 PM By: Linton Ham MD Entered By: Linton Ham on 04/01/2020 16:01:04 [place], [name] ([id number]) -------------------------------------------------------------------------------- Physician Orders Details Patient Name: ALFONSA, VAILE B. Date of Service: 04/01/2020 3:00 PM Medical Record Number: [id number] Patient Account Number: [id number] Date of Birth/Sex: 1932-05-06 (86 y.o. M) Treating RN: [name] Primary Care Provider: Lavera Guise Other Clinician: Referring Provider: Lavera Guise Treating Provider/Extender: Tito Dine in Treatment: 4 Verbal / Phone Orders: No Diagnosis Coding Wound Cleansing Wound #3 Left,Lateral Lower Leg o Clean wound with Normal Saline. Primary Wound Dressing Wound #3 Left,Lateral Lower Leg o Silver Collagen - moisten with hydrogel Secondary Dressing Wound #3 Left,Lateral Lower Leg o ABD pad Dressing Change  Frequency Wound #3 Left,Lateral Lower Leg o Change dressing every week Follow-up Appointments Wound #3 Left,Lateral Lower Leg o Return Appointment  in 1 week. Edema Control Wound #3 Left,Lateral Lower Leg o 3 Layer Compression System - Left Lower Extremity o Elevate legs to the level of the heart and pump ankles as often as possible Electronic Signature(s) Signed: 04/01/2020 5:18:09 PM By: [name] Signed: 04/01/2020 5:46:06 PM By: Linton Ham MD Entered By: [name] on 04/01/2020 15:34:18 [place], [name] ([id number]) -------------------------------------------------------------------------------- Progress Note Details Patient Name: Riedl, Ceejay B. Date of Service: 04/01/2020 3:00 PM Medical Record Number: [id number] Patient Account Number: [id number] Date of Birth/Sex: May 31, 1932 (86 y.o. M) Treating RN: [name] Primary Care Provider: Lavera Guise Other Clinician: Referring Provider: Lavera Guise Treating Provider/Extender: Tito Dine in Treatment: 4 Subjective History of Present Illness (HPI) ADMISSION 03/04/2020 Patient is an 86 year old man who arrives accompanied by his wife. He states that he has had wounds on his bilateral lower extremities that started with increased swelling in his lower extremities. He has quite significant bilateral lower extremity pitting edema but he states that this is only been there for 2 or 3 weeks. Says he saw his primary doctor who put him on what sounds like a diuretic. He has developed small wounds on the left lateral which is actually the most prominent wound. Very small superficial wounds on the left anterior and a cluster of very small wounds on the right lateral. These are not particularly painful although he states that his legs are itchy. on arrival in the clinic our intake nurse could not obtain pulses bilaterally. She could not maintain of enough of a Doppler in the right dorsalis pedis to get an ABI and on  the left she could not even obtain it by Doppler. The patient does have pain in his legs when he walks. He says 10 to 15 minutes he has to sit down and rest although he thought that this was because of his knees. He also mentions that his wounds are painful at night when he is lying in bed sometimes waking him up at 5:00 in the morning. Past medical history includes hypertension, CVA in 2011 with no residual effects, osteoarthritis of both knees, recent seizure disorder but apparently complete work-up was negative, ITP, vitamin B12 deficiency and stage III chronic renal failure We could not obtain ABIs in our clinic as noted above 03/11/2020 upon evaluation today patient actually appears to be doing much better in regard to his bilateral lower extremities. Fortunately there is no signs of infection. He actually saw Dr. Dellia Nims during his initial visit last week and he was treated very appropriately his wounds appear to be doing much better. Also don't have the complete evaluation as far as his arterial study is concerned that the vascular doctor has not read it yet. With that being said it did appear that the patient's findings were consistent with being okay. His TBI's were somewhat low at 0.39 on the right and 0.49 on the left with an ABI of 1.15 on the right and 1.35 on the left. With that being said I think that the fact that he is healing and has tolerated the wrap very well at this point is a good indication that he is good to do just fine. 03/18/2020 upon evaluation today patient appears to be doing pretty well in regard to his leg ulcer on the left. Fortunately there is no signs of active infection at this time. No fevers, chills, nausea, vomiting, or diarrhea. I am actually rather pleased with how things seem to be progressing. Overall the patient's wound I  think is ready for collagen I do not believe there is enough drainage to warrant having to continue with the alginate at this point. 5/13;  small wound on the left lateral lower leg. We are using silver collagen. Not much improvement this week in surface area or condition of the wound. Wrapping and kerlix Coban. Previous ABI was noncompressible 5/20; the patient's wound on the left lateral lower leg is smaller. We have been using silver collagen. I increased him from 2-3 layer compressions last week. Edema control is better Since he was last here he tells me he was in the [facility] for 48 hours with what sounds like a TIA. I have not checked these records Objective Constitutional Sitting or standing Blood Pressure is within target range for patient.. Pulse regular and within target range for patient.Marland Kitchen Respirations regular, non- labored and within target range.. Temperature is normal and within the target range for the patient.Marland Kitchen appears in no distress. Vitals Time Taken: 3:05 PM, Height: 69 in, Weight: 173 lbs, BMI: 25.5, Temperature: 98.2 F, Pulse: 66 bpm, Respiratory Rate: 18 breaths/min, Blood Pressure: 118/51 mmHg. Cardiovascular We have better edema control with 3 layer compression. General Notes: Wound exam; left lateral lower extremity. Wound is smaller. Still requires debridement with a #3 curette to remove tightly adherent fibrous debris but it cleans up quite nicely and the wound measurements are better. There is no evidence of surrounding infection Integumentary (Hair, Skin) Galindez, Jacob B. ([id number]) Wound #3 status is Open. Original cause of wound was Gradually Appeared. The wound is located on the Left,Lateral Lower Leg. The wound measures 1.2cm length x 0.5cm width x 0.2cm depth; 0.471cm^2 area and 0.094cm^3 volume. There is Fat Layer (Subcutaneous Tissue) Exposed exposed. There is no tunneling or undermining noted. There is a medium amount of serous drainage noted. The wound margin is flat and intact. There is large (67-100%) pink granulation within the wound bed. There is a small (1-33%) amount of necrotic  tissue within the wound bed including Adherent Slough. Procedures Wound #3 Pre-procedure diagnosis of Wound #3 is a Venous Leg Ulcer located on the Left,Lateral Lower Leg .Severity of Tissue Pre Debridement is: Fat layer exposed. There was a Excisional Skin/Subcutaneous Tissue Debridement with a total area of 0.6 sq cm performed by Ricard Dillon, MD. With the following instrument(s): Curette to remove Viable and Non-Viable tissue/material. Material removed includes Subcutaneous Tissue and Slough and after achieving pain control using Lidocaine. A time out was conducted at 15:30, prior to the start of the procedure. A Minimum amount of bleeding was controlled with Pressure. The procedure was tolerated well. Post Debridement Measurements: 1.2cm length x 0.5cm width x 0.2cm depth; 0.094cm^3 volume. Character of Wound/Ulcer Post Debridement is stable. Severity of Tissue Post Debridement is: Fat layer exposed. Post procedure Diagnosis Wound #3: Same as Pre-Procedure Pre-procedure diagnosis of Wound #3 is a Venous Leg Ulcer located on the Left,Lateral Lower Leg . There was a Three Layer Compression Therapy Procedure by [name], RN. Post procedure Diagnosis Wound #3: Same as Pre-Procedure Plan Wound Cleansing: Wound #3 Left,Lateral Lower Leg: Clean wound with Normal Saline. Primary Wound Dressing: Wound #3 Left,Lateral Lower Leg: Silver Collagen - moisten with hydrogel Secondary Dressing: Wound #3 Left,Lateral Lower Leg: ABD pad Dressing Change Frequency: Wound #3 Left,Lateral Lower Leg: Change dressing every week Follow-up Appointments: Wound #3 Left,Lateral Lower Leg: Return Appointment in 1 week. Edema Control: Wound #3 Left,Lateral Lower Leg: 3 Layer Compression System - Left Lower Extremity Elevate legs to  the level of the heart and pump ankles as often as possible 1. Continue with silver collagen ABDs under 3 layer compression. 2. Wound measurements were  better. Electronic Signature(s) Signed: 04/01/2020 5:46:06 PM By: Linton Ham MD Entered By: Linton Ham on 04/01/2020 [place], [name] ([id number]) -------------------------------------------------------------------------------- [place] Details Patient Name: WILBON, OBENCHAIN B. Date of Service: 04/01/2020 Medical Record Number: [id number] Patient Account Number: [id number] Date of Birth/Sex: [birth year] (86 y.o. M) Treating RN: [name] Primary Care Provider: Lavera Guise Other Clinician: Referring Provider: Lavera Guise Treating Provider/Extender: Tito Dine in Treatment: 4 Diagnosis Coding ICD-10 Codes Code Description [phone] Chronic venous hypertension (idiopathic) with ulcer and inflammation of bilateral lower extremity L97.821 Non-pressure chronic ulcer of other part of left lower leg limited to breakdown of skin L97.811 Non-pressure chronic ulcer of other part of right lower leg limited to breakdown of skin I70.213 Atherosclerosis of native arteries of extremities with intermittent claudication, bilateral legs Facility Procedures CPT4 Code Description: [id number] 11042 - DEB SUBQ TISSUE 20 SQ CM/< Modifier: Quantity: 1 CPT4 Code Description: ICD-10 Diagnosis Description I87.333 Chronic venous hypertension (idiopathic) with ulcer and inflammation of bilate L97.821 Non-pressure chronic ulcer of other part of left lower leg limited to breakdow Modifier: ral lower extr n of skin Quantity: emity Physician Procedures CPT4 Code Description: [id number] - WC PHYS SUBQ TISS 20 SQ CM Modifier: Quantity: 1 CPT4 Code Description: ICD-10 Diagnosis Description I87.333 Chronic venous hypertension (idiopathic) with ulcer and inflammation of bilate L97.821 Non-pressure chronic ulcer of other part of left lower leg limited to breakdow Modifier: ral lower extre n of skin Quantity: Financial trader) Signed: 04/01/2020 5:46:06 PM By: Linton Ham  MD Entered By: Linton Ham on 04/01/2020 16:01:58

## 2020-04-02 NOTE — Progress Notes (Signed)
BENJAMAN, [name] ([id number]) Visit Report for 04/01/2020 Arrival Information Details Patient Name: [name], [name]. Date of Service: 04/01/2020 3:00 PM Medical Record Number: [id number] Patient Account Number: [id number] Date of Birth/Sex: December 13, 1931 (86 y.o. M) Treating RN: [name] Primary Care Baltasar Twilley: Lavera Guise Other Clinician: Referring Ebonye Reade: Lavera Guise Treating Daley Mooradian/Extender: Tito Dine in Treatment: 4 Visit Information History Since Last Visit Added or deleted any medications: No Patient Arrived: Ambulatory Any new allergies or adverse reactions: No Arrival Time: 15:04 Had a fall or experienced change in No Accompanied By: wife activities of daily living that may affect Transfer Assistance: None risk of falls: Patient Identification Verified: Yes Signs or symptoms of abuse/neglect since last visito No Secondary Verification Process Completed: Yes Hospitalized since last visit: No Patient Has Alerts: Yes Implantable device outside of the clinic excluding No Patient Alerts: Patient on Blood Thinner cellular tissue based products placed in the center Aspirin 325 since last visit: Has Dressing in Place as Prescribed: Yes Pain Present Now: No Electronic Signature(s) Signed: 04/01/2020 5:33:40 PM By: Lorine Bears RCP, RRT, CHT Entered By: Lorine Bears on 04/01/2020 15:05:21 [name] ([id number]) -------------------------------------------------------------------------------- Compression Therapy Details Patient Name: [name] Mole B. Date of Service: 04/01/2020 3:00 PM Medical Record Number: [id number] Patient Account Number: [id number] Date of Birth/Sex: Dec 16, 1931 (86 y.o. M) Treating RN: [name] Primary Care Deshia Vanderhoof: Lavera Guise Other Clinician: Referring Sol Odor: Lavera Guise Treating Keyvin Rison/Extender: Tito Dine in Treatment: 4 Compression Therapy Performed for Wound Assessment: Wound  #3 Left,Lateral Lower Leg Performed By: Clinician [name], RN Compression Type: Three Layer Post Procedure Diagnosis Same as Pre-procedure Electronic Signature(s) Signed: 04/01/2020 5:18:09 PM By: [name] Entered By: [name] on 04/01/2020 15:33:55 [name] ([id number]) -------------------------------------------------------------------------------- Encounter Discharge Information Details Patient Name: [name], [name] Date of Service: 04/01/2020 3:00 PM Medical Record Number: [id number] Patient Account Number: [id number] Date of Birth/Sex: 07-Nov-1932 (86 y.o. M) Treating RN: [name] Primary Care Vidal Lampkins: Lavera Guise Other Clinician: Referring Breylen Agyeman: Lavera Guise Treating Eeshan Verbrugge/Extender: Tito Dine in Treatment: 4 Encounter Discharge Information Items Post Procedure Vitals Discharge Condition: Stable Temperature (F): 98.2 Ambulatory Status: Ambulatory Pulse (bpm): 64 Discharge Destination: Home Respiratory Rate (breaths/min): 16 Transportation: Private Auto Blood Pressure (mmHg): 118/51 Accompanied By: wife Schedule Follow-up Appointment: Yes Clinical Summary of Care: Electronic Signature(s) Signed: 04/01/2020 5:18:09 PM By: [name] Entered By: [name] on 04/01/2020 15:35:04 [name] ([id number]) -------------------------------------------------------------------------------- Lower Extremity Assessment Details Patient Name: [name] Mole B. Date of Service: 04/01/2020 3:00 PM Medical Record Number: [id number] Patient Account Number: [id number] Date of Birth/Sex: 1932/06/23 (86 y.o. M) Treating RN: Montey Hora Primary Care Miley Lindon: Lavera Guise Other Clinician: Referring Raynor Calcaterra: Lavera Guise Treating Carlyn Mullenbach/Extender: Tito Dine in Treatment: 4 Edema Assessment Assessed: [Left: No] [Right: No] Edema: [Left: Ye] [Right: s] Calf Left: Right: Point of Measurement: 32 cm From Medial Instep 33 cm  cm Ankle Left: Right: Point of Measurement: 10 cm From Medial Instep 27 cm cm Vascular Assessment Pulses: Dorsalis Pedis Palpable: [Left:Yes] Electronic Signature(s) Signed: 04/01/2020 5:37:23 PM By: Montey Hora Entered By: Montey Hora on 04/01/2020 15:14:08 [name] ([id number]) -------------------------------------------------------------------------------- Multi Wound Chart Details Patient Name: [name] Mole B. Date of Service: 04/01/2020 3:00 PM Medical Record Number: [id number] Patient Account Number: [id number] Date of Birth/Sex: [birth year] (86 y.o. M) Treating RN: [name] Primary Care Usama Harkless: Lavera Guise Other Clinician: Referring Audray Rumore: Lavera Guise Treating Xaniyah Buchholz/Extender: Tito Dine in Treatment: 4 Vital  Signs Height(in): 69 Pulse(bpm): 66 Weight(lbs): 173 Blood Pressure(mmHg): 118/51 Body Mass Index(BMI): 26 Temperature(F): 98.2 Respiratory Rate(breaths/min): 18 Photos: [N/A:N/A] Wound Location: Left, Lateral Lower Leg N/A N/A Wounding Event: Gradually Appeared N/A N/A Primary Etiology: Venous Leg Ulcer N/A N/A Comorbid History: Hypertension, End Stage Renal N/A N/A Disease, Osteoarthritis, Seizure Disorder Date Acquired: 02/19/2020 N/A N/A Weeks of Treatment: 4 N/A N/A Wound Status: Open N/A N/A Wound Recurrence: Yes N/A N/A Measurements L x W x D (cm) 1.2x0.5x0.2 N/A N/A Area (cm) : 0.471 N/A N/A Volume (cm) : 0.094 N/A N/A % Reduction in Area: 20.00% N/A N/A % Reduction in Volume: -59.30% N/A N/A Classification: Full Thickness Without Exposed N/A N/A Support Structures Exudate Amount: Medium N/A N/A Exudate Type: Serous N/A N/A Exudate Color: amber N/A N/A Wound Margin: Flat and Intact N/A N/A Granulation Amount: Large (67-100%) N/A N/A Granulation Quality: Pink N/A N/A Necrotic Amount: Small (1-33%) N/A N/A Exposed Structures: Fat Layer (Subcutaneous Tissue) N/A N/A Exposed: Yes Fascia: No Tendon:  No Muscle: No Joint: No Bone: No Epithelialization: None N/A N/A Treatment Notes Electronic Signature(s) Signed: 04/01/2020 5:18:09 PM By: [name] Entered By: [name] on 04/01/2020 15:32:48 [name] ([id number]) Commack, Gwenyth [name] ([id number]) -------------------------------------------------------------------------------- Multi-Disciplinary Care Plan Details Patient Name: [name], [name] Date of Service: 04/01/2020 3:00 PM Medical Record Number: [id number] Patient Account Number: [id number] Date of Birth/Sex: 1932/08/31 (86 y.o. M) Treating RN: [name] Primary Care Tenise Stetler: Lavera Guise Other Clinician: Referring Avarey Yaeger: Lavera Guise Treating Tonja Jezewski/Extender: Tito Dine in Treatment: 4 Active Inactive Orientation to the Wound Care Program Nursing Diagnoses: Knowledge deficit related to the wound healing center program Goals: Patient/caregiver will verbalize understanding of the [place] Program Date Initiated: 03/04/2020 Target Resolution Date: 04/11/2020 Goal Status: Active Interventions: Provide education on orientation to the wound center Notes: Venous Leg Ulcer Nursing Diagnoses: Knowledge deficit related to disease process and management Goals: Patient/caregiver will verbalize understanding of disease process and disease management Date Initiated: 03/04/2020 Target Resolution Date: 04/11/2020 Goal Status: Active Interventions: Assess peripheral edema status every visit. Notes: Wound/Skin Impairment Nursing Diagnoses: Impaired tissue integrity Goals: Ulcer/skin breakdown will have a volume reduction of 30% by week 4 Date Initiated: 03/04/2020 Target Resolution Date: 03/31/2020 Goal Status: Active Interventions: Assess ulceration(s) every visit Notes: Electronic Signature(s) Signed: 04/01/2020 5:18:09 PM By: [name] Entered By: [name] on 04/01/2020 15:32:27 Hickox, Gwenyth [name]  ([id number]) -------------------------------------------------------------------------------- Pain Assessment Details Patient Name: [name] Mole B. Date of Service: 04/01/2020 3:00 PM Medical Record Number: [id number] Patient Account Number: [id number] Date of Birth/Sex: 10-15-1932 (86 y.o. M) Treating RN: Montey Hora Primary Care Adyn Hoes: Lavera Guise Other Clinician: Referring Emani Morad: Lavera Guise Treating Dollene Mallery/Extender: Tito Dine in Treatment: 4 Active Problems Location of Pain Severity and Description of Pain Patient Has Paino No Site Locations Pain Management and Medication Current Pain Management: Electronic Signature(s) Signed: 04/01/2020 5:37:23 PM By: Montey Hora Entered By: Montey Hora on 04/01/2020 15:09:58 Kamara, Gwenyth [name] ([id number]) -------------------------------------------------------------------------------- Patient/Caregiver Education Details Patient Name: [name] Mole B. Date of Service: 04/01/2020 3:00 PM Medical Record Number: [id number] Patient Account Number: [id number] Date of Birth/Gender: [birth year] (86 y.o. M) Treating RN: [name] Primary Care Physician: Lavera Guise Other Clinician: Referring Physician: Lavera Guise Treating Physician/Extender: Tito Dine in Treatment: 4 Education Assessment Education Provided To: Patient Education Topics Provided Wound/Skin Impairment: Handouts: Caring for Your Ulcer Methods: Demonstration, Explain/Verbal Responses: State content correctly Electronic Signature(s) Signed: 04/01/2020 5:18:09 PM By: [name] Entered By: Nicki Reaper,  Dajea on 04/01/2020 15:34:31 [place] ([id number]) -------------------------------------------------------------------------------- Wound Assessment Details Patient Name: [name], [name] Date of Service: 04/01/2020 3:00 PM Medical Record Number: [id number] Patient Account Number: [id number] Date of Birth/Sex: [birth year] (86 y.o. M) Treating  RN: Montey Hora Primary Care Donald Jacque: Lavera Guise Other Clinician: Referring Pegeen Stiger: Lavera Guise Treating Monay Houlton/Extender: Tito Dine in Treatment: 4 Wound Status Wound Number: 3 Primary Venous Leg Ulcer Etiology: Wound Location: Left, Lateral Lower Leg Wound Status: Open Wounding Event: Gradually Appeared Comorbid Hypertension, End Stage Renal Disease, Osteoarthritis, Date Acquired: 02/19/2020 History: Seizure Disorder Weeks Of Treatment: 4 Clustered Wound: No Photos Wound Measurements Length: (cm) 1.2 Width: (cm) 0.5 Depth: (cm) 0.2 Area: (cm) 0.471 Volume: (cm) 0.094 % Reduction in Area: 20% % Reduction in Volume: -59.3% Epithelialization: None Tunneling: No Undermining: No Wound Description Classification: Full Thickness Without Exposed Support Structures Wound Margin: Flat and Intact Exudate Amount: Medium Exudate Type: Serous Exudate Color: amber Foul Odor After Cleansing: No Slough/Fibrino Yes Wound Bed Granulation Amount: Large (67-100%) Exposed Structure Granulation Quality: Pink Fascia Exposed: No Necrotic Amount: Small (1-33%) Fat Layer (Subcutaneous Tissue) Exposed: Yes Necrotic Quality: Adherent Slough Tendon Exposed: No Muscle Exposed: No Joint Exposed: No Bone Exposed: No Treatment Notes Wound #3 (Left, Lateral Lower Leg) Notes prisma, ABD, 3LL Electronic Signature(s) Signed: 04/01/2020 5:37:23 PM By: Durene Fruits ([id number]) Entered By: Montey Hora on 04/01/2020 15:18:22 Whitwell, Gwenyth [name] ([id number]) -------------------------------------------------------------------------------- [place] Details Patient Name: [name] Mole B. Date of Service: 04/01/2020 3:00 PM Medical Record Number: [id number] Patient Account Number: [id number] Date of Birth/Sex: 09-Apr-1932 (86 y.o. M) Treating RN: [name] Primary Care Annaliz Aven: Lavera Guise Other Clinician: Referring Zamya Culhane: Lavera Guise Treating  Marsha Gundlach/Extender: Tito Dine in Treatment: 4 Vital Signs Time Taken: 15:05 Temperature (F): 98.2 Height (in): 69 Pulse (bpm): 66 Weight (lbs): 173 Respiratory Rate (breaths/min): 18 Body Mass Index (BMI): 25.5 Blood Pressure (mmHg): 118/51 Reference Range: 80 - 120 mg / dl Electronic Signature(s) Signed: 04/01/2020 5:33:40 PM By: Lorine Bears RCP, RRT, CHT Entered By: Lorine Bears on 04/01/2020 15:07:57

## 2020-04-05 DIAGNOSIS — K219 Gastro-esophageal reflux disease without esophagitis: Secondary | ICD-10-CM | POA: Diagnosis not present

## 2020-04-05 DIAGNOSIS — Z6827 Body mass index (BMI) 27.0-27.9, adult: Secondary | ICD-10-CM | POA: Diagnosis not present

## 2020-04-05 DIAGNOSIS — D649 Anemia, unspecified: Secondary | ICD-10-CM | POA: Diagnosis not present

## 2020-04-05 DIAGNOSIS — G459 Transient cerebral ischemic attack, unspecified: Secondary | ICD-10-CM | POA: Diagnosis not present

## 2020-04-05 DIAGNOSIS — I10 Essential (primary) hypertension: Secondary | ICD-10-CM | POA: Diagnosis not present

## 2020-04-05 DIAGNOSIS — E039 Hypothyroidism, unspecified: Secondary | ICD-10-CM | POA: Diagnosis not present

## 2020-04-09 ENCOUNTER — Other Ambulatory Visit: Payer: Self-pay

## 2020-04-09 ENCOUNTER — Encounter: Payer: PPO | Admitting: Physician Assistant

## 2020-04-09 DIAGNOSIS — L97811 Non-pressure chronic ulcer of other part of right lower leg limited to breakdown of skin: Secondary | ICD-10-CM | POA: Diagnosis not present

## 2020-04-09 DIAGNOSIS — I87333 Chronic venous hypertension (idiopathic) with ulcer and inflammation of bilateral lower extremity: Secondary | ICD-10-CM | POA: Diagnosis not present

## 2020-04-09 DIAGNOSIS — I70213 Atherosclerosis of native arteries of extremities with intermittent claudication, bilateral legs: Secondary | ICD-10-CM | POA: Diagnosis not present

## 2020-04-09 DIAGNOSIS — L97821 Non-pressure chronic ulcer of other part of left lower leg limited to breakdown of skin: Secondary | ICD-10-CM | POA: Diagnosis not present

## 2020-04-09 NOTE — Progress Notes (Signed)
[name] ([id number]) Visit Report for 04/09/2020 Arrival Information Details Patient Name: [name], [name]. Date of Service: 04/09/2020 11:15 AM Medical Record Number: [id number] Patient Account Number: [id number] Date of Birth/Sex: 1932-08-30 (86 y.o. M) Treating RN: Primary Care Philena Obey: Lavera Guise Other Clinician: Referring Pheobe Sandiford: Lavera Guise Treating Terrionna Bridwell/Extender: Melburn Hake, HOYT Weeks in Treatment: 5 Visit Information History Since Last Visit Added or deleted any medications: No Patient Arrived: Ambulatory Any new allergies or adverse reactions: No Arrival Time: 11:30 Had a fall or experienced change in No Accompanied By: wife activities of daily living that may affect Transfer Assistance: None risk of falls: Patient Identification Verified: Yes Signs or symptoms of abuse/neglect since last visito No Secondary Verification Process Completed: Yes Hospitalized since last visit: No Patient Has Alerts: Yes Implantable device outside of the clinic excluding No Patient Alerts: Patient on Blood Thinner cellular tissue based products placed in the center Aspirin 325 since last visit: Has Dressing in Place as Prescribed: Yes Pain Present Now: No Electronic Signature(s) Signed: 04/09/2020 1:20:34 PM By: Lorine Bears RCP, RRT, CHT Entered By: Lorine Bears on 04/09/2020 11:34:27 Been, [name] ([id number]) -------------------------------------------------------------------------------- Compression Therapy Details Patient Name: [name]. Date of Service: 04/09/2020 11:15 AM Medical Record Number: [id number] Patient Account Number: [id number] Date of Birth/Sex: October 12, 1932 (86 y.o. M) Treating RN: [name] Primary Care Sereen Schaff: Lavera Guise Other Clinician: Referring Omar Gayden: Lavera Guise Treating Geoffery Aultman/Extender: Melburn Hake, HOYT Weeks in Treatment: 5 Compression Therapy Performed for Wound Assessment: Wound #3 Left,Lateral  Lower Leg Performed By: Clinician [name], RN Compression Type: Three Layer Post Procedure Diagnosis Same as Pre-procedure Electronic Signature(s) Signed: 04/09/2020 1:04:11 PM By: [name] Entered By: [name] on 04/09/2020 12:11:09 Bulthuis, [name] ([id number]) -------------------------------------------------------------------------------- Encounter Discharge Information Details Patient Name: [name]. Date of Service: 04/09/2020 11:15 AM Medical Record Number: [id number] Patient Account Number: [id number] Date of Birth/Sex: May 15, 1932 (86 y.o. M) Treating RN: [name] Primary Care Lorance Pickeral: Lavera Guise Other Clinician: Referring Roselyne Stalnaker: Lavera Guise Treating Briana Newman/Extender: Melburn Hake, HOYT Weeks in Treatment: 5 Encounter Discharge Information Items Discharge Condition: Stable Ambulatory Status: Ambulatory Discharge Destination: Home Transportation: Private Auto Accompanied By: wife Schedule Follow-up Appointment: Yes Clinical Summary of Care: Electronic Signature(s) Signed: 04/09/2020 1:04:11 PM By: [name] Entered By: [name] on 04/09/2020 12:12:31 Yung, [name] ([id number]) -------------------------------------------------------------------------------- Lower Extremity Assessment Details Patient Name: [name]. Date of Service: 04/09/2020 11:15 AM Medical Record Number: [id number] Patient Account Number: [id number] Date of Birth/Sex: 18-Sep-1932 (86 y.o. M) Treating RN: [name] Primary Care Jadeyn Hargett: Lavera Guise Other Clinician: Referring Jasim Harari: Lavera Guise Treating Iyauna Sing/Extender: Melburn Hake, HOYT Weeks in Treatment: 5 Edema Assessment Assessed: [Left: No] [Right: No] Edema: [Left: N] [Right: o] Calf Left: Right: Point of Measurement: 32 cm From Medial Instep 33 cm cm Ankle Left: Right: Point of Measurement: 10 cm From Medial Instep 27 cm cm Vascular Assessment Pulses: Dorsalis Pedis Palpable:  [Left:Yes] Electronic Signature(s) Signed: 04/09/2020 1:04:11 PM By: [name] Entered By: [name] on 04/09/2020 11:57:39 Rhames, [name] ([id number]) -------------------------------------------------------------------------------- Multi Wound Chart Details Patient Name: [name]. Date of Service: 04/09/2020 11:15 AM Medical Record Number: [id number] Patient Account Number: [id number] Date of Birth/Sex: 05-06-1932 (86 y.o. M) Treating RN: [name] Primary Care Shade Kaley: Lavera Guise Other Clinician: Referring Kyro Joswick: Lavera Guise Treating Inioluwa Baris/Extender: Melburn Hake, HOYT Weeks in Treatment: 5 Vital Signs Height(in): 69 Pulse(bpm): 68 Weight(lbs): 173 Blood Pressure(mmHg): 144/67 Body Mass Index(BMI): 26 Temperature(F): 98.1 Respiratory Rate(breaths/min): 18  Photos: [N/A:N/A] Wound Location: Left, Lateral Lower Leg N/A N/A Wounding Event: Gradually Appeared N/A N/A Primary Etiology: Venous Leg Ulcer N/A N/A Comorbid History: Hypertension, End Stage Renal N/A N/A Disease, Osteoarthritis, Seizure Disorder Date Acquired: 02/19/2020 N/A N/A Weeks of Treatment: 5 N/A N/A Wound Status: Open N/A N/A Wound Recurrence: Yes N/A N/A Measurements L x W x D (cm) 1x0.3x0.2 N/A N/A Area (cm) : 0.236 N/A N/A Volume (cm) : 0.047 N/A N/A % Reduction in Area: 59.90% N/A N/A % Reduction in Volume: 20.30% N/A N/A Classification: Full Thickness Without Exposed N/A N/A Support Structures Exudate Amount: Medium N/A N/A Exudate Type: Serous N/A N/A Exudate Color: amber N/A N/A Wound Margin: Flat and Intact N/A N/A Granulation Amount: Large (67-100%) N/A N/A Granulation Quality: Pink N/A N/A Necrotic Amount: Small (1-33%) N/A N/A Exposed Structures: Fat Layer (Subcutaneous Tissue) N/A N/A Exposed: Yes Fascia: No Tendon: No Muscle: No Joint: No Bone: No Epithelialization: None N/A N/A Treatment Notes Electronic Signature(s) Signed: 04/09/2020 1:04:11 PM By: [name] Entered By: [name] on 04/09/2020 12:10:18 [place], [name] ([id number]) [place], [name] ([id number]) -------------------------------------------------------------------------------- Multi-Disciplinary Care Plan Details Patient Name: KALIEL, BOLDS [name]. Date of Service: 04/09/2020 11:15 AM Medical Record Number: [id number] Patient Account Number: [id number] Date of Birth/Sex: Jan 12, 1932 (86 y.o. M) Treating RN: [name] Primary Care Maeva Dant: Lavera Guise Other Clinician: Referring Akansha Wyche: Lavera Guise Treating Ritesh Opara/Extender: Melburn Hake, HOYT Weeks in Treatment: 5 Active Inactive Orientation to the Wound Care Program Nursing Diagnoses: Knowledge deficit related to the wound healing center program Goals: Patient/caregiver will verbalize understanding of the [place] Program Date Initiated: 03/04/2020 Target Resolution Date: 04/11/2020 Goal Status: Active Interventions: Provide education on orientation to the wound center Notes: Venous Leg Ulcer Nursing Diagnoses: Knowledge deficit related to disease process and management Goals: Patient/caregiver will verbalize understanding of disease process and disease management Date Initiated: 03/04/2020 Target Resolution Date: 04/11/2020 Goal Status: Active Interventions: Assess peripheral edema status every visit. Notes: Wound/Skin Impairment Nursing Diagnoses: Impaired tissue integrity Goals: Ulcer/skin breakdown will have a volume reduction of 30% by week 4 Date Initiated: 03/04/2020 Target Resolution Date: 03/31/2020 Goal Status: Active Interventions: Assess ulceration(s) every visit Notes: Electronic Signature(s) Signed: 04/09/2020 1:04:11 PM By: [name] Entered By: [name] on 04/09/2020 12:09:54 Kinzler, [name] ([id number]) -------------------------------------------------------------------------------- Pain Assessment Details Patient Name: [name]. Date of Service: 04/09/2020 11:15  AM Medical Record Number: [id number] Patient Account Number: [id number] Date of Birth/Sex: 01/07/1932 (86 y.o. M) Treating RN: [name] Primary Care Ismael Karge: Lavera Guise Other Clinician: Referring Darcy Barbara: Lavera Guise Treating Taras Rask/Extender: Melburn Hake, HOYT Weeks in Treatment: 5 Active Problems Location of Pain Severity and Description of Pain Patient Has Paino No Site Locations Pain Management and Medication Current Pain Management: Electronic Signature(s) Signed: 04/09/2020 1:04:11 PM By: [name] Entered By: [name] on 04/09/2020 11:57:00 Ortego, [name] ([id number]) -------------------------------------------------------------------------------- Patient/Caregiver Education Details Patient Name: [name]. Date of Service: 04/09/2020 11:15 AM Medical Record Number: [id number] Patient Account Number: [id number] Date of Birth/Gender: 07-13-1932 (86 y.o. M) Treating RN: [name] Primary Care Physician: Lavera Guise Other Clinician: Referring Physician: Lavera Guise Treating Physician/Extender: Sharalyn Ink in Treatment: 5 Education Assessment Education Provided To: Patient Education Topics Provided Wound/Skin Impairment: Handouts: Caring for Your Ulcer Methods: Demonstration, Explain/Verbal Responses: State content correctly Electronic Signature(s) Signed: 04/09/2020 1:04:11 PM By: [name] Entered By: [name] on 04/09/2020 12:12:06 Kostelnik, [name] ([id number]) -------------------------------------------------------------------------------- Wound Assessment Details Patient Name: [name]. Date of  Service: 04/09/2020 11:15 AM Medical Record Number: [id number] Patient Account Number: [id number] Date of Birth/Sex: [birth year] (86 y.o. M) Treating RN: [name] Primary Care Chrislynn Mosely: Lavera Guise Other Clinician: Referring Avannah Decker: Lavera Guise Treating Jane Birkel/Extender: Melburn Hake, HOYT Weeks in Treatment: 5 Wound  Status Wound Number: 3 Primary Venous Leg Ulcer Etiology: Wound Location: Left, Lateral Lower Leg Wound Status: Open Wounding Event: Gradually Appeared Comorbid Hypertension, End Stage Renal Disease, Osteoarthritis, Date Acquired: 02/19/2020 History: Seizure Disorder Weeks Of Treatment: 5 Clustered Wound: No Photos Wound Measurements Length: (cm) 1 Width: (cm) 0.3 Depth: (cm) 0.2 Area: (cm) 0.236 Volume: (cm) 0.047 % Reduction in Area: 59.9% % Reduction in Volume: 20.3% Epithelialization: None Wound Description Classification: Full Thickness Without Exposed Support Structures Wound Margin: Flat and Intact Exudate Amount: Medium Exudate Type: Serous Exudate Color: amber Foul Odor After Cleansing: No Slough/Fibrino Yes Wound Bed Granulation Amount: Large (67-100%) Exposed Structure Granulation Quality: Pink Fascia Exposed: No Necrotic Amount: Small (1-33%) Fat Layer (Subcutaneous Tissue) Exposed: Yes Necrotic Quality: Adherent Slough Tendon Exposed: No Muscle Exposed: No Joint Exposed: No Bone Exposed: No Treatment Notes Wound #3 (Left, Lateral Lower Leg) Notes prisma, ABD, 3LL Electronic Signature(s) Signed: 04/09/2020 1:04:11 PM By: Manuela Schwartz ([id number]) Entered By: [name] on 04/09/2020 11:57:20 Deasis, [name] ([id number]) -------------------------------------------------------------------------------- Vitals Details Patient Name: [name]. Date of Service: 04/09/2020 11:15 AM Medical Record Number: [id number] Patient Account Number: [id number] Date of Birth/Sex: June 01, 1932 (86 y.o. M) Treating RN: Primary Care Nellie Chevalier: Lavera Guise Other Clinician: Referring Chyann Ambrocio: Lavera Guise Treating Americus Scheurich/Extender: STONE III, HOYT Weeks in Treatment: 5 Vital Signs Time Taken: 11:35 Temperature (F): 98.1 Height (in): 69 Pulse (bpm): 68 Weight (lbs): 173 Respiratory Rate (breaths/min): 18 Body Mass Index (BMI): 25.5 Blood  Pressure (mmHg): 144/67 Reference Range: 80 - 120 mg / dl Electronic Signature(s) Signed: 04/09/2020 1:20:34 PM By: Lorine Bears RCP, RRT, CHT Entered By: Lorine Bears on 04/09/2020 11:37:14

## 2020-04-09 NOTE — Progress Notes (Addendum)
TALBERT, TREMBATH ([id number]) Visit Report for 04/09/2020 Chief Complaint Document Details Patient Name: [name], [name]. Date of Service: 04/09/2020 11:15 AM Medical Record Number: [id number] Patient Account Number: [id number] Date of Birth/Sex: [birth year] (86 y.o. M) Treating RN: Primary Care Provider: Lavera Guise Other Clinician: Referring Provider: Lavera Guise Treating Provider/Extender: Melburn Hake, HOYT Weeks in Treatment: 5 Information Obtained from: Patient Chief Complaint 03/04/2020; patient is here for review of bilateral small lower extremity wounds Electronic Signature(s) Signed: 04/09/2020 12:08:25 PM By: Worthy Keeler PA-C Entered By: Worthy Keeler on 04/09/2020 12:08:25 Dilday, [name] ([id number]) -------------------------------------------------------------------------------- HPI Details Patient Name: [name] Mole B. Date of Service: 04/09/2020 11:15 AM Medical Record Number: [id number] Patient Account Number: [id number] Date of Birth/Sex: 1932-11-05 (86 y.o. M) Treating RN: Primary Care Provider: Lavera Guise Other Clinician: Referring Provider: Lavera Guise Treating Provider/Extender: Melburn Hake, HOYT Weeks in Treatment: 5 History of Present Illness HPI Description: ADMISSION 03/04/2020 Patient is an 86 year old man who arrives accompanied by his wife. He states that he has had wounds on his bilateral lower extremities that started with increased swelling in his lower extremities. He has quite significant bilateral lower extremity pitting edema but he states that this is only been there for 2 or 3 weeks. Says he saw his primary doctor who put him on what sounds like a diuretic. He has developed small wounds on the left lateral which is actually the most prominent wound. Very small superficial wounds on the left anterior and a cluster of very small wounds on the right lateral. These are not particularly painful although he states that his legs are itchy. on arrival in the  clinic our intake nurse could not obtain pulses bilaterally. She could not maintain of enough of a Doppler in the right dorsalis pedis to get an ABI and on the left she could not even obtain it by Doppler. The patient does have pain in his legs when he walks. He says 10 to 15 minutes he has to sit down and rest although he thought that this was because of his knees. He also mentions that his wounds are painful at night when he is lying in bed sometimes waking him up at 5:00 in the morning. Past medical history includes hypertension, CVA in 2011 with no residual effects, osteoarthritis of both knees, recent seizure disorder but apparently complete work-up was negative, ITP, vitamin B12 deficiency and stage III chronic renal failure We could not obtain ABIs in our clinic as noted above 03/11/2020 upon evaluation today patient actually appears to be doing much better in regard to his bilateral lower extremities. Fortunately there is no signs of infection. He actually saw Dr. Dellia Nims during his initial visit last week and he was treated very appropriately his wounds appear to be doing much better. Also don't have the complete evaluation as far as his arterial study is concerned that the vascular doctor has not read it yet. With that being said it did appear that the patient's findings were consistent with being okay. His TBI's were somewhat low at 0.39 on the right and 0.49 on the left with an ABI of 1.15 on the right and 1.35 on the left. With that being said I think that the fact that he is healing and has tolerated the wrap very well at this point is a good indication that he is good to do just fine. 03/18/2020 upon evaluation today patient appears to be doing pretty well in regard to his leg ulcer on the left.  Fortunately there is no signs of active infection at this time. No fevers, chills, nausea, vomiting, or diarrhea. I am actually rather pleased with how things seem to be progressing. Overall the  patient's wound I think is ready for collagen I do not believe there is enough drainage to warrant having to continue with the alginate at this point. 5/13; small wound on the left lateral lower leg. We are using silver collagen. Not much improvement this week in surface area or condition of the wound. Wrapping and kerlix Coban. Previous ABI was noncompressible 5/20; the patient's wound on the left lateral lower leg is smaller. We have been using silver collagen. I increased him from 2-3 layer compressions last week. Edema control is better Since he was last here he tells me he was in the [facility] for [facility] hours with what sounds like a TIA. I have not checked these records 04/09/2020 on evaluation today patient appears to be doing well with regard to his wound. This is measuring significantly better and seems to be doing excellent. The collagen I think is still the best option for him at this point. Fortunately there is no evidence of active infection at this time. Electronic Signature(s) Signed: 04/09/2020 12:14:08 PM By: Worthy Keeler PA-C Entered By: Worthy Keeler on 04/09/2020 12:14:08 Reinders, [name] ([id number]) -------------------------------------------------------------------------------- Physical Exam Details Patient Name: [name], [name] Date of Service: 04/09/2020 11:15 AM Medical Record Number: [id number] Patient Account Number: [id number] Date of Birth/Sex: 1931/12/18 (86 y.o. M) Treating RN: Primary Care Provider: Lavera Guise Other Clinician: Referring Provider: Lavera Guise Treating Provider/Extender: STONE III, HOYT Weeks in Treatment: 5 Constitutional Well-nourished and well-hydrated in no acute distress. Respiratory normal breathing without difficulty. Psychiatric this patient is able to make decisions and demonstrates good insight into disease process. Alert and Oriented x 3. pleasant and cooperative. Notes Patient's wound VAC currently showed evidence of good  granulation and epithelization overall very pleased in this regard and the patient likewise is happy that things are doing so well. With that being said I do see evidence of significant dry skin we can actually make some lotion with half triamcinolone in order to help with the itching and dry skin the patient is in agreement with that plan. Electronic Signature(s) Signed: 04/09/2020 12:14:44 PM By: Worthy Keeler PA-C Entered By: Worthy Keeler on 04/09/2020 12:14:44 Burck, [name] ([id number]) -------------------------------------------------------------------------------- Physician Orders Details Patient Name: [name], [name] Date of Service: 04/09/2020 11:15 AM Medical Record Number: [id number] Patient Account Number: [id number] Date of Birth/Sex: 01-28-1932 (86 y.o. M) Treating RN: [name] Primary Care Provider: Lavera Guise Other Clinician: Referring Provider: Lavera Guise Treating Provider/Extender: Melburn Hake, HOYT Weeks in Treatment: 5 Verbal / Phone Orders: No Diagnosis Coding ICD-10 Coding Code Description I87.333 Chronic venous hypertension (idiopathic) with ulcer and inflammation of bilateral lower extremity L97.821 Non-pressure chronic ulcer of other part of left lower leg limited to breakdown of skin L97.811 Non-pressure chronic ulcer of other part of right lower leg limited to breakdown of skin I70.213 Atherosclerosis of native arteries of extremities with intermittent claudication, bilateral legs Wound Cleansing Wound #3 Left,Lateral Lower Leg o Clean wound with Normal Saline. Primary Wound Dressing Wound #3 Left,Lateral Lower Leg o Silver Collagen - moisten with hydrogel Secondary Dressing Wound #3 Left,Lateral Lower Leg o ABD pad Dressing Change Frequency Wound #3 Left,Lateral Lower Leg o Change dressing every week Follow-up Appointments Wound #3 Left,Lateral Lower Leg o Return Appointment in 1 week. Edema Control Wound #3  Left,Lateral Lower  Leg o 3 Layer Compression System - Left Lower Extremity o Elevate legs to the level of the heart and pump ankles as often as possible Electronic Signature(s) Signed: 04/09/2020 1:04:11 PM By: [name] Signed: 04/09/2020 2:31:22 PM By: Worthy Keeler PA-C Entered By: [name] on 04/09/2020 12:11:46 Suchecki, [name] ([id number]) -------------------------------------------------------------------------------- Problem List Details Patient Name: [name], [name] Date of Service: 04/09/2020 11:15 AM Medical Record Number: [id number] Patient Account Number: [id number] Date of Birth/Sex: 1932/01/24 (86 y.o. M) Treating RN: Primary Care Provider: Lavera Guise Other Clinician: Referring Provider: Lavera Guise Treating Provider/Extender: Melburn Hake, HOYT Weeks in Treatment: 5 Active Problems ICD-10 Encounter Code Description Active Date MDM Diagnosis I87.333 Chronic venous hypertension (idiopathic) with ulcer and inflammation of 03/04/2020 No Yes bilateral lower extremity L97.821 Non-pressure chronic ulcer of other part of left lower leg limited to 03/04/2020 No Yes breakdown of skin L97.811 Non-pressure chronic ulcer of other part of right lower leg limited to 03/04/2020 No Yes breakdown of skin I70.213 Atherosclerosis of native arteries of extremities with intermittent 03/04/2020 No Yes claudication, bilateral legs Inactive Problems Resolved Problems Electronic Signature(s) Signed: 04/09/2020 12:08:20 PM By: Worthy Keeler PA-C Entered By: Worthy Keeler on 04/09/2020 12:08:19 Pantaleon, [name] ([id number]) -------------------------------------------------------------------------------- Progress Note Details Patient Name: [name] Mole B. Date of Service: 04/09/2020 11:15 AM Medical Record Number: [id number] Patient Account Number: [id number] Date of Birth/Sex: 09/19/1932 (86 y.o. M) Treating RN: Primary Care Provider: Lavera Guise Other Clinician: Referring Provider: Lavera Guise Treating Provider/Extender: Melburn Hake, HOYT Weeks in Treatment: 5 Subjective Chief Complaint Information obtained from Patient 03/04/2020; patient is here for review of bilateral small lower extremity wounds History of Present Illness (HPI) ADMISSION 03/04/2020 Patient is an 86 year old man who arrives accompanied by his wife. He states that he has had wounds on his bilateral lower extremities that started with increased swelling in his lower extremities. He has quite significant bilateral lower extremity pitting edema but he states that this is only been there for 2 or 3 weeks. Says he saw his primary doctor who put him on what sounds like a diuretic. He has developed small wounds on the left lateral which is actually the most prominent wound. Very small superficial wounds on the left anterior and a cluster of very small wounds on the right lateral. These are not particularly painful although he states that his legs are itchy. on arrival in the clinic our intake nurse could not obtain pulses bilaterally. She could not maintain of enough of a Doppler in the right dorsalis pedis to get an ABI and on the left she could not even obtain it by Doppler. The patient does have pain in his legs when he walks. He says 10 to 15 minutes he has to sit down and rest although he thought that this was because of his knees. He also mentions that his wounds are painful at night when he is lying in bed sometimes waking him up at 5:00 in the morning. Past medical history includes hypertension, CVA in 2011 with no residual effects, osteoarthritis of both knees, recent seizure disorder but apparently complete work-up was negative, ITP, vitamin B12 deficiency and stage III chronic renal failure We could not obtain ABIs in our clinic as noted above 03/11/2020 upon evaluation today patient actually appears to be doing much better in regard to his bilateral lower extremities. Fortunately there is no signs of  infection. He actually saw Dr. Dellia Nims during his initial visit last week and  he was treated very appropriately his wounds appear to be doing much better. Also don't have the complete evaluation as far as his arterial study is concerned that the vascular doctor has not read it yet. With that being said it did appear that the patient's findings were consistent with being okay. His TBI's were somewhat low at 0.39 on the right and 0.49 on the left with an ABI of 1.15 on the right and 1.35 on the left. With that being said I think that the fact that he is healing and has tolerated the wrap very well at this point is a good indication that he is good to do just fine. 03/18/2020 upon evaluation today patient appears to be doing pretty well in regard to his leg ulcer on the left. Fortunately there is no signs of active infection at this time. No fevers, chills, nausea, vomiting, or diarrhea. I am actually rather pleased with how things seem to be progressing. Overall the patient's wound I think is ready for collagen I do not believe there is enough drainage to warrant having to continue with the alginate at this point. 5/13; small wound on the left lateral lower leg. We are using silver collagen. Not much improvement this week in surface area or condition of the wound. Wrapping and kerlix Coban. Previous ABI was noncompressible 5/20; the patient's wound on the left lateral lower leg is smaller. We have been using silver collagen. I increased him from 2-3 layer compressions last week. Edema control is better Since he was last here he tells me he was in the [facility] for [facility] hours with what sounds like a TIA. I have not checked these records 04/09/2020 on evaluation today patient appears to be doing well with regard to his wound. This is measuring significantly better and seems to be doing excellent. The collagen I think is still the best option for him at this point. Fortunately there is no evidence of active  infection at this time. Objective Constitutional Well-nourished and well-hydrated in no acute distress. Vitals Time Taken: 11:35 AM, Height: 69 in, Weight: 173 lbs, BMI: 25.5, Temperature: 98.1 F, Pulse: 68 bpm, Respiratory Rate: 18 breaths/min, Blood Pressure: 144/67 mmHg. Respiratory [name], [name] ([id number]) normal breathing without difficulty. Psychiatric this patient is able to make decisions and demonstrates good insight into disease process. Alert and Oriented x 3. pleasant and cooperative. General Notes: Patient's wound VAC currently showed evidence of good granulation and epithelization overall very pleased in this regard and the patient likewise is happy that things are doing so well. With that being said I do see evidence of significant dry skin we can actually make some lotion with half triamcinolone in order to help with the itching and dry skin the patient is in agreement with that plan. Integumentary (Hair, Skin) Wound #3 status is Open. Original cause of wound was Gradually Appeared. The wound is located on the Left,Lateral Lower Leg. The wound measures 1cm length x 0.3cm width x 0.2cm depth; 0.236cm^2 area and 0.047cm^3 volume. There is Fat Layer (Subcutaneous Tissue) Exposed exposed. There is a medium amount of serous drainage noted. The wound margin is flat and intact. There is large (67-100%) pink granulation within the wound bed. There is a small (1-33%) amount of necrotic tissue within the wound bed including Adherent Slough. Assessment Active Problems ICD-10 Chronic venous hypertension (idiopathic) with ulcer and inflammation of bilateral lower extremity Non-pressure chronic ulcer of other part of left lower leg limited to breakdown of skin  Non-pressure chronic ulcer of other part of right lower leg limited to breakdown of skin Atherosclerosis of native arteries of extremities with intermittent claudication, bilateral legs Procedures Wound #3 Pre-procedure  diagnosis of Wound #3 is a Venous Leg Ulcer located on the Left,Lateral Lower Leg . There was a Three Layer Compression Therapy Procedure by [name], RN. Post procedure Diagnosis Wound #3: Same as Pre-Procedure Plan Wound Cleansing: Wound #3 Left,Lateral Lower Leg: Clean wound with Normal Saline. Primary Wound Dressing: Wound #3 Left,Lateral Lower Leg: Silver Collagen - moisten with hydrogel Secondary Dressing: Wound #3 Left,Lateral Lower Leg: ABD pad Dressing Change Frequency: Wound #3 Left,Lateral Lower Leg: Change dressing every week Follow-up Appointments: Wound #3 Left,Lateral Lower Leg: Return Appointment in 1 week. Edema Control: Wound #3 Left,Lateral Lower Leg: 3 Layer Compression System - Left Lower Extremity Elevate legs to the level of the heart and pump ankles as often as possible 1. I would recommend currently that we go and continue with the current wound care measures utilizing the compression wrap followed by the collagen to the wound bed. We will get a use triamcinolone and lotion mixed one-to-one in order to help with the itching and dry skin on his leg. 2. I am also can recommend he continue to elevate his legs much as possible try to keep edema under good control. [name], [name] ([id number]) 3. I would also recommend that the patient continue to monitor for any signs of worsening such as pain if he has any issues she knows to contact the office let me know. We will see patient back for reevaluation in 1 week here in the clinic. If anything worsens or changes patient will contact our office for additional recommendations. Electronic Signature(s) Signed: 04/09/2020 12:15:20 PM By: Worthy Keeler PA-C Entered By: Worthy Keeler on 04/09/2020 12:15:20 [name], [name] ([id number]) -------------------------------------------------------------------------------- SuperBill Details Patient Name: [name] Mole B. Date of Service: 04/09/2020 Medical Record Number:  [id number] Patient Account Number: [id number] Date of Birth/Sex: [birth year] (86 y.o. M) Treating RN: [name] Primary Care Provider: Lavera Guise Other Clinician: Referring Provider: Lavera Guise Treating Provider/Extender: Melburn Hake, HOYT Weeks in Treatment: 5 Diagnosis Coding ICD-10 Codes Code Description I87.333 Chronic venous hypertension (idiopathic) with ulcer and inflammation of bilateral lower extremity L97.821 Non-pressure chronic ulcer of other part of left lower leg limited to breakdown of skin L97.811 Non-pressure chronic ulcer of other part of right lower leg limited to breakdown of skin I70.213 Atherosclerosis of native arteries of extremities with intermittent claudication, bilateral legs Facility Procedures CPT4 Code: 08657846 Description: (Facility Use Only) [phone] - APPLY [place] LWR LT LEG Modifier: Quantity: 1 Physician Procedures CPT4 Code Description: [id number] - WC PHYS LEVEL 3 - EST PT Modifier: Quantity: 1 CPT4 Code Description: ICD-10 Diagnosis Description I87.333 Chronic venous hypertension (idiopathic) with ulcer and inflammation of bila L97.821 Non-pressure chronic ulcer of other part of left lower leg limited to breakd L97.811 Non-pressure chronic  ulcer of other part of right lower leg limited to break I70.213 Atherosclerosis of native arteries of extremities with intermittent claudica Modifier: teral lower extrem own of skin down of skin tion, bilateral le Quantity: ity gs Electronic Signature(s) Signed: 04/09/2020 12:15:32 PM By: Worthy Keeler PA-C Entered By: Worthy Keeler on 04/09/2020 12:15:32

## 2020-04-16 ENCOUNTER — Other Ambulatory Visit: Payer: Self-pay

## 2020-04-16 ENCOUNTER — Encounter: Payer: PPO | Attending: Physician Assistant | Admitting: Physician Assistant

## 2020-04-16 DIAGNOSIS — N183 Chronic kidney disease, stage 3 unspecified: Secondary | ICD-10-CM | POA: Diagnosis not present

## 2020-04-16 DIAGNOSIS — I129 Hypertensive chronic kidney disease with stage 1 through stage 4 chronic kidney disease, or unspecified chronic kidney disease: Secondary | ICD-10-CM | POA: Insufficient documentation

## 2020-04-16 DIAGNOSIS — Z8673 Personal history of transient ischemic attack (TIA), and cerebral infarction without residual deficits: Secondary | ICD-10-CM | POA: Insufficient documentation

## 2020-04-16 DIAGNOSIS — G40909 Epilepsy, unspecified, not intractable, without status epilepticus: Secondary | ICD-10-CM | POA: Diagnosis not present

## 2020-04-16 DIAGNOSIS — L97821 Non-pressure chronic ulcer of other part of left lower leg limited to breakdown of skin: Secondary | ICD-10-CM | POA: Insufficient documentation

## 2020-04-16 DIAGNOSIS — L97811 Non-pressure chronic ulcer of other part of right lower leg limited to breakdown of skin: Secondary | ICD-10-CM | POA: Insufficient documentation

## 2020-04-16 DIAGNOSIS — I87333 Chronic venous hypertension (idiopathic) with ulcer and inflammation of bilateral lower extremity: Secondary | ICD-10-CM | POA: Diagnosis not present

## 2020-04-16 DIAGNOSIS — M170 Bilateral primary osteoarthritis of knee: Secondary | ICD-10-CM | POA: Diagnosis not present

## 2020-04-16 DIAGNOSIS — I70213 Atherosclerosis of native arteries of extremities with intermittent claudication, bilateral legs: Secondary | ICD-10-CM | POA: Insufficient documentation

## 2020-04-16 DIAGNOSIS — L97822 Non-pressure chronic ulcer of other part of left lower leg with fat layer exposed: Secondary | ICD-10-CM | POA: Diagnosis not present

## 2020-04-16 DIAGNOSIS — I872 Venous insufficiency (chronic) (peripheral): Secondary | ICD-10-CM | POA: Diagnosis not present

## 2020-04-16 NOTE — Progress Notes (Signed)
UNNAMED, ZEIEN ([id number]) Visit Report for 04/16/2020 Arrival Information Details Patient Name: [name], [name]. Date of Service: 04/16/2020 3:00 PM Medical Record Number: [id number] Patient Account Number: [id number] Date of Birth/Sex: 03/25/1932 (86 y.o. M) Treating RN: Montey Hora Primary Care Cj Beecher: Lavera Guise Other Clinician: Referring Icyss Skog: Lavera Guise Treating Arneshia Ade/Extender: Melburn Hake, HOYT Weeks in Treatment: 6 Visit Information History Since Last Visit Added or deleted any medications: No Patient Arrived: Ambulatory Any new allergies or adverse reactions: No Arrival Time: 14:40 Had a fall or experienced change in No Accompanied By: wife activities of daily living that may affect Transfer Assistance: None risk of falls: Patient Identification Verified: Yes Signs or symptoms of abuse/neglect since last visito No Secondary Verification Process Completed: Yes Hospitalized since last visit: No Patient Has Alerts: Yes Implantable device outside of the clinic excluding No Patient Alerts: Patient on Blood Thinner cellular tissue based products placed in the center Aspirin 325 since last visit: Has Dressing in Place as Prescribed: Yes Pain Present Now: No Electronic Signature(s) Signed: 04/16/2020 4:07:07 PM By: Lorine Bears RCP, RRT, CHT Entered By: Becky Sax, Amado Nash on 04/16/2020 14:41:10 Tancredi, [name] ([id number]) -------------------------------------------------------------------------------- Compression Therapy Details Patient Name: [name] Mole B. Date of Service: 04/16/2020 3:00 PM Medical Record Number: [id number] Patient Account Number: [id number] Date of Birth/Sex: 1932-06-10 (86 y.o. M) Treating RN: Montey Hora Primary Care Gidget Quizhpi: Lavera Guise Other Clinician: Referring Tyrese Capriotti: Lavera Guise Treating Taleigh Gero/Extender: Melburn Hake, HOYT Weeks in Treatment: 6 Compression Therapy Performed for Wound Assessment: Wound #3  Left,Lateral Lower Leg Performed By: Clinician Montey Hora, RN Compression Type: Three Layer Pre Treatment ABI: 1.4 Post Procedure Diagnosis Same as Pre-procedure Electronic Signature(s) Signed: 04/16/2020 4:32:38 PM By: Montey Hora Entered By: Montey Hora on 04/16/2020 15:10:45 [name], [name] ([id number]) -------------------------------------------------------------------------------- Encounter Discharge Information Details Patient Name: [name], [name] Date of Service: 04/16/2020 3:00 PM Medical Record Number: [id number] Patient Account Number: [id number] Date of Birth/Sex: [birth year] (86 y.o. M) Treating RN: Montey Hora Primary Care [place]: Lavera Guise Other Clinician: Referring Rodina Pinales: Lavera Guise Treating [place] Inthavong/Extender: Melburn Hake, HOYT Weeks in Treatment: 6 Encounter Discharge Information Items Post Procedure Vitals Discharge Condition: Stable Temperature (F): 98.1 Ambulatory Status: Ambulatory Pulse (bpm): 63 Discharge Destination: Home Respiratory Rate (breaths/min): 16 Transportation: Private Auto Blood Pressure (mmHg): 145/73 Accompanied By: self Schedule Follow-up Appointment: Yes Clinical Summary of Care: Electronic Signature(s) Signed: 04/16/2020 4:32:38 PM By: Montey Hora Entered By: Montey Hora on 04/16/2020 15:12:10 [place], [name] ([id number]) -------------------------------------------------------------------------------- Lower Extremity Assessment Details Patient Name: [name] Mole B. Date of Service: 04/16/2020 3:00 PM Medical Record Number: [id number] Patient Account Number: [id number] Date of Birth/Sex: November 29, 1931 (86 y.o. M) Treating RN: [name] Primary Care Indea Dearman: Lavera Guise Other Clinician: Referring Chanah Tidmore: Lavera Guise Treating Krisy Dix/Extender: Melburn Hake, HOYT Weeks in Treatment: 6 Edema Assessment Assessed: [Left: No] [Right: No] Edema: [Left: N] [Right: o] Calf Left: Right: Point of Measurement: 32 cm From  Medial Instep 33 cm cm Ankle Left: Right: Point of Measurement: 10 cm From Medial Instep 25 cm cm Vascular Assessment Pulses: Dorsalis Pedis Palpable: [Left:Yes] Electronic Signature(s) Signed: 04/16/2020 3:23:06 PM By: [name] Entered By: [name] on 04/16/2020 14:55:17 [name], [name] ([id number]) -------------------------------------------------------------------------------- Multi Wound Chart Details Patient Name: [name] Mole B. Date of Service: 04/16/2020 3:00 PM Medical Record Number: [id number] Patient Account Number: [id number] Date of Birth/Sex: 04-21-1932 (86 y.o. M) Treating RN: Montey Hora Primary Care Lagena Strand: Lavera Guise Other Clinician: Referring Lincy Belles: Lavera Guise Treating Sajid Ruppert/Extender: Melburn Hake, HOYT Weeks  in Treatment: 6 Vital Signs Height(in): 69 Pulse(bpm): 63 Weight(lbs): 173 Blood Pressure(mmHg): 145/73 Body Mass Index(BMI): 26 Temperature(F): 98.1 Respiratory Rate(breaths/min): 18 Photos: [N/A:N/A] Wound Location: Left, Lateral Lower Leg N/A N/A Wounding Event: Gradually Appeared N/A N/A Primary Etiology: Venous Leg Ulcer N/A N/A Comorbid History: Hypertension, End Stage Renal N/A N/A Disease, Osteoarthritis, Seizure Disorder Date Acquired: 02/19/2020 N/A N/A Weeks of Treatment: 6 N/A N/A Wound Status: Open N/A N/A Wound Recurrence: Yes N/A N/A Measurements L x W x D (cm) 0.5x0.2x0.1 N/A N/A Area (cm) : 0.079 N/A N/A Volume (cm) : 0.008 N/A N/A % Reduction in Area: 86.60% N/A N/A % Reduction in Volume: 86.40% N/A N/A Classification: Full Thickness Without Exposed N/A N/A Support Structures Exudate Amount: Medium N/A N/A Exudate Type: Serous N/A N/A Exudate Color: amber N/A N/A Wound Margin: Flat and Intact N/A N/A Granulation Amount: Large (67-100%) N/A N/A Granulation Quality: Pink N/A N/A Necrotic Amount: Small (1-33%) N/A N/A Exposed Structures: Fat Layer (Subcutaneous Tissue) N/A N/A Exposed: Yes Fascia:  No Tendon: No Muscle: No Joint: No Bone: No Epithelialization: None N/A N/A Treatment Notes Electronic Signature(s) Signed: 04/16/2020 2:59:29 PM By: Montey Hora Entered By: Montey Hora on 04/16/2020 14:59:28 [name], [name] ([id number]) [place], [name] ([id number]) -------------------------------------------------------------------------------- Multi-Disciplinary Care Plan Details Patient Name: [name], [name] Date of Service: 04/16/2020 3:00 PM Medical Record Number: [id number] Patient Account Number: [id number] Date of Birth/Sex: [birth year] (86 y.o. M) Treating RN: Montey Hora Primary Care Mayur Duman: Lavera Guise Other Clinician: Referring Burdell Peed: Lavera Guise Treating Tache Bobst/Extender: Melburn Hake, HOYT Weeks in Treatment: 6 Active Inactive Orientation to the Wound Care Program Nursing Diagnoses: Knowledge deficit related to the wound healing center program Goals: Patient/caregiver will verbalize understanding of the [facility] Date Initiated: 03/04/2020 Target Resolution Date: 04/11/2020 Goal Status: Active Interventions: Provide education on orientation to the wound center Notes: Venous Leg Ulcer Nursing Diagnoses: Knowledge deficit related to disease process and management Goals: Patient/caregiver will verbalize understanding of disease process and disease management Date Initiated: 03/04/2020 Target Resolution Date: 04/11/2020 Goal Status: Active Interventions: Assess peripheral edema status every visit. Notes: Wound/Skin Impairment Nursing Diagnoses: Impaired tissue integrity Goals: Ulcer/skin breakdown will have a volume reduction of 30% by week 4 Date Initiated: 03/04/2020 Target Resolution Date: 03/31/2020 Goal Status: Active Interventions: Assess ulceration(s) every visit Notes: Electronic Signature(s) Signed: 04/16/2020 2:59:20 PM By: Montey Hora Entered By: Montey Hora on 04/16/2020 14:59:19 [name], [name]  ([id number]) -------------------------------------------------------------------------------- Pain Assessment Details Patient Name: [name] Mole B. Date of Service: 04/16/2020 3:00 PM Medical Record Number: [id number] Patient Account Number: [id number] Date of Birth/Sex: [birth year] (86 y.o. M) Treating RN: [name] Primary Care Gillian Meeuwsen: Lavera Guise Other Clinician: Referring Aviyon Hocevar: Lavera Guise Treating Seymour Pavlak/Extender: Melburn Hake, HOYT Weeks in Treatment: 6 Active Problems Location of Pain Severity and Description of Pain Patient Has Paino No Site Locations Pain Management and Medication Current Pain Management: Electronic Signature(s) Signed: 04/16/2020 3:23:06 PM By: [name] Entered By: [name] on 04/16/2020 14:54:08 [name], [name] ([id number]) -------------------------------------------------------------------------------- Patient/Caregiver Education Details Patient Name: [name] Mole B. Date of Service: 04/16/2020 3:00 PM Medical Record Number: [id number] Patient Account Number: [id number] Date of Birth/Gender: Jul 25, 1932 (86 y.o. M) Treating RN: Montey Hora Primary Care Physician: Lavera Guise Other Clinician: Referring Physician: Lavera Guise Treating Physician/Extender: Sharalyn Ink in Treatment: 6 Education Assessment Education Provided To: Patient and Caregiver Education Topics Provided Venous: Handouts: Other: ongoing compression Methods: Explain/Verbal Responses: State content correctly Electronic Signature(s) Signed: 04/16/2020 4:32:38 PM By: Montey Hora  Entered By: Montey Hora on 04/16/2020 15:11:03 [name], [name] ([id number]) -------------------------------------------------------------------------------- Wound Assessment Details Patient Name: [name], [name] Date of Service: 04/16/2020 3:00 PM Medical Record Number: [id number] Patient Account Number: [id number] Date of Birth/Sex: 1932-07-16 (86 y.o. M) Treating RN: [name] Primary Care Kodey Xue: Lavera Guise Other Clinician: Referring Karishma Unrein: Lavera Guise Treating Mailee Klaas/Extender: Melburn Hake, HOYT Weeks in Treatment: 6 Wound Status Wound Number: 3 Primary Venous Leg Ulcer Etiology: Wound Location: Left, Lateral Lower Leg Wound Status: Open Wounding Event: Gradually Appeared Comorbid Hypertension, End Stage Renal Disease, Osteoarthritis, Date Acquired: 02/19/2020 History: Seizure Disorder Weeks Of Treatment: 6 Clustered Wound: No Photos Wound Measurements Length: (cm) 0.5 Width: (cm) 0.2 Depth: (cm) 0.1 Area: (cm) 0.079 Volume: (cm) 0.008 % Reduction in Area: 86.6% % Reduction in Volume: 86.4% Epithelialization: None Wound Description Classification: Full Thickness Without Exposed Support Structu Wound Margin: Flat and Intact Exudate Amount: Medium Exudate Type: Serous Exudate Color: amber res Foul Odor After Cleansing: No Slough/Fibrino Yes Wound Bed Granulation Amount: Large (67-100%) Exposed Structure Granulation Quality: Pink Fascia Exposed: No Necrotic Amount: Small (1-33%) Fat Layer (Subcutaneous Tissue) Exposed: Yes Necrotic Quality: Adherent Slough Tendon Exposed: No Muscle Exposed: No Joint Exposed: No Bone Exposed: No Treatment Notes Wound #3 (Left, Lateral Lower Leg) Notes prisma, ABD, 3LL Electronic Signature(s) Signed: 04/16/2020 3:23:06 PM By: Manuela Schwartz ([id number]) Entered By: [name] on 04/16/2020 14:54:30 [name], [name] ([id number]) -------------------------------------------------------------------------------- Vitals Details Patient Name: [name] Mole B. Date of Service: 04/16/2020 3:00 PM Medical Record Number: [id number] Patient Account Number: [id number] Date of Birth/Sex: [birth year] (86 y.o. M) Treating RN: Montey Hora Primary Care Doaa Kendzierski: Lavera Guise Other Clinician: Referring Donella Pascarella: Lavera Guise Treating Moria Brophy/Extender: Melburn Hake, HOYT Weeks in Treatment:  6 Vital Signs Time Taken: 14:40 Temperature (F): 98.1 Height (in): 69 Pulse (bpm): 63 Weight (lbs): 173 Respiratory Rate (breaths/min): 18 Body Mass Index (BMI): 25.5 Blood Pressure (mmHg): 145/73 Reference Range: 80 - 120 mg / dl Electronic Signature(s) Signed: 04/16/2020 4:07:07 PM By: Lorine Bears RCP, RRT, CHT Entered By: Becky Sax, Amado Nash on 04/16/2020 14:44:09

## 2020-04-16 NOTE — Progress Notes (Addendum)
BRODEN, HOLT ([id number]) Visit Report for 04/16/2020 Chief Complaint Document Details Patient Name: [name], [name] Date of Service: 04/16/2020 3:00 PM Medical Record Number: [id number] Patient Account Number: [id number] Date of Birth/Sex: 06/24/1932 (86 y.o. M) Treating RN: Montey Hora Primary Care Provider: Lavera Guise Other Clinician: Referring Provider: Lavera Guise Treating Provider/Extender: Melburn Hake, Sonika Levins Weeks in Treatment: 6 Information Obtained from: Patient Chief Complaint 03/04/2020; patient is here for review of bilateral small lower extremity wounds Electronic Signature(s) Signed: 04/16/2020 3:00:52 PM By: Worthy Keeler PA-C Entered By: Worthy Keeler on 04/16/2020 15:00:51 [name], [name] ([id number]) -------------------------------------------------------------------------------- Debridement Details Patient Name: [name] Mole B. Date of Service: 04/16/2020 3:00 PM Medical Record Number: [id number] Patient Account Number: [id number] Date of Birth/Sex: June 05, 1932 (86 y.o. M) Treating RN: Montey Hora Primary Care Provider: Lavera Guise Other Clinician: Referring Provider: Lavera Guise Treating Provider/Extender: Melburn Hake, Ansar Skoda Weeks in Treatment: 6 Debridement Performed for Wound #3 Left,Lateral Lower Leg Assessment: Performed By: Physician STONE III, Paislie Tessler E., PA-C Debridement Type: Debridement Severity of Tissue Pre Debridement: Fat layer exposed Level of Consciousness (Pre- Awake and Alert procedure): Pre-procedure Verification/Time Out Yes - 15:07 Taken: Start Time: 15:07 Pain Control: Lidocaine 4% Topical Solution Total Area Debrided (L x W): 0.5 (cm) x 0.2 (cm) = 0.1 (cm) Tissue and other material Viable, Non-Viable, Slough, Subcutaneous, Skin: Dermis , Skin: Epidermis, Slough debrided: Level: Skin/Subcutaneous Tissue Debridement Description: Excisional Instrument: Curette Bleeding: Minimum Hemostasis Achieved: Pressure End Time:  15:09 Procedural Pain: 0 Post Procedural Pain: 0 Response to Treatment: Procedure was tolerated well Level of Consciousness (Post- Awake and Alert procedure): Post Debridement Measurements of Total Wound Length: (cm) 0.5 Width: (cm) 0.2 Depth: (cm) 0.2 Volume: (cm) 0.016 Character of Wound/Ulcer Post Debridement: Improved Severity of Tissue Post Debridement: Fat layer exposed Post Procedure Diagnosis Same as Pre-procedure Electronic Signature(s) Signed: 04/16/2020 4:32:38 PM By: Montey Hora Signed: 04/16/2020 6:04:00 PM By: Worthy Keeler PA-C Entered By: Montey Hora on 04/16/2020 15:09:56 [place] ([id number]) -------------------------------------------------------------------------------- HPI Details Patient Name: [name] Mole B. Date of Service: 04/16/2020 3:00 PM Medical Record Number: [id number] Patient Account Number: [id number] Date of Birth/Sex: 1932-05-24 (86 y.o. M) Treating RN: Montey Hora Primary Care Provider: Lavera Guise Other Clinician: Referring Provider: Lavera Guise Treating Provider/Extender: Melburn Hake, Marilyne Haseley Weeks in Treatment: 6 History of Present Illness HPI Description: ADMISSION 03/04/2020 Patient is an 86 year old man who arrives accompanied by his wife. He states that he has had wounds on his bilateral lower extremities that started with increased swelling in his lower extremities. He has quite significant bilateral lower extremity pitting edema but he states that this is only been there for 2 or 3 weeks. Says he saw his primary doctor who put him on what sounds like a diuretic. He has developed small wounds on the left lateral which is actually the most prominent wound. Very small superficial wounds on the left anterior and a cluster of very small wounds on the right lateral. These are not particularly painful although he states that his legs are itchy. on arrival in the clinic our intake nurse could not obtain pulses bilaterally. She could  not maintain of enough of a Doppler in the right dorsalis pedis to get an ABI and on the left she could not even obtain it by Doppler. The patient does have pain in his legs when he walks. He says 10 to 15 minutes he has to sit down and rest although he thought that this was because of his knees.  He also mentions that his wounds are painful at night when he is lying in bed sometimes waking him up at 5:00 in the morning. Past medical history includes hypertension, CVA in 2011 with no residual effects, osteoarthritis of both knees, recent seizure disorder but apparently complete work-up was negative, ITP, vitamin B12 deficiency and stage III chronic renal failure We could not obtain ABIs in our clinic as noted above 03/11/2020 upon evaluation today patient actually appears to be doing much better in regard to his bilateral lower extremities. Fortunately there is no signs of infection. He actually saw Dr. Dellia Nims during his initial visit last week and he was treated very appropriately his wounds appear to be doing much better. Also don't have the complete evaluation as far as his arterial study is concerned that the vascular doctor has not read it yet. With that being said it did appear that the patient's findings were consistent with being okay. His TBI's were somewhat low at 0.39 on the right and 0.49 on the left with an ABI of 1.15 on the right and 1.35 on the left. With that being said I think that the fact that he is healing and has tolerated the wrap very well at this point is a good indication that he is good to do just fine. 03/18/2020 upon evaluation today patient appears to be doing pretty well in regard to his leg ulcer on the left. Fortunately there is no signs of active infection at this time. No fevers, chills, nausea, vomiting, or diarrhea. I am actually rather pleased with how things seem to be progressing. Overall the patient's wound I think is ready for collagen I do not believe there is  enough drainage to warrant having to continue with the alginate at this point. 5/13; small wound on the left lateral lower leg. We are using silver collagen. Not much improvement this week in surface area or condition of the wound. Wrapping and kerlix Coban. Previous ABI was noncompressible 5/20; the patient's wound on the left lateral lower leg is smaller. We have been using silver collagen. I increased him from 2-3 layer compressions last week. Edema control is better Since he was last here he tells me he was in the [facility] for 48 hours with what sounds like a TIA. I have not checked these records 04/09/2020 on evaluation today patient appears to be doing well with regard to his wound. This is measuring significantly better and seems to be doing excellent. The collagen I think is still the best option for him at this point. Fortunately there is no evidence of active infection at this time. 04/16/2020 upon evaluation today patient appears to be doing excellent in regard to his lower extremity ulcer. In fact this is measuring quite a bit smaller and overall I feel like he is doing very well in that regard. There is no signs of active infection at this time. No fevers, chills, nausea, vomiting, or diarrhea. I think he is very close to complete resolution. Electronic Signature(s) Signed: 04/16/2020 4:31:08 PM By: Worthy Keeler PA-C Entered By: Worthy Keeler on 04/16/2020 16:31:08 [name], [name] ([id number]) -------------------------------------------------------------------------------- Physical Exam Details Patient Name: [name], [name] Date of Service: 04/16/2020 3:00 PM Medical Record Number: [id number] Patient Account Number: [id number] Date of Birth/Sex: 1931/12/14 (86 y.o. M) Treating RN: Montey Hora Primary Care Provider: Lavera Guise Other Clinician: Referring Provider: Lavera Guise Treating Provider/Extender: Melburn Hake, Nilson Tabora Weeks in Treatment: 6 Constitutional Well-nourished  and well-hydrated in no acute  distress. Respiratory normal breathing without difficulty. Psychiatric this patient is able to make decisions and demonstrates good insight into disease process. Alert and Oriented x 3. pleasant and cooperative. Notes On inspection patient's wound bed actually showed signs of excellent granulation and epithelization in fact this is almost completely closed though not quite. I think he still can require little bit more intervention at this point before this will be completely resolved. Nonetheless I am very pleased with where things stand today. Electronic Signature(s) Signed: 04/16/2020 4:31:27 PM By: Worthy Keeler PA-C Entered By: Worthy Keeler on 04/16/2020 16:31:27 [name], [name] ([id number]) -------------------------------------------------------------------------------- Physician Orders Details Patient Name: [name], [name] Date of Service: 04/16/2020 3:00 PM Medical Record Number: [id number] Patient Account Number: [id number] Date of Birth/Sex: 1931-11-29 (86 y.o. M) Treating RN: Montey Hora Primary Care Provider: Lavera Guise Other Clinician: Referring Provider: Lavera Guise Treating Provider/Extender: Melburn Hake, Medhansh Brinkmeier Weeks in Treatment: 6 Verbal / Phone Orders: No Diagnosis Coding ICD-10 Coding Code Description I87.333 Chronic venous hypertension (idiopathic) with ulcer and inflammation of bilateral lower extremity L97.821 Non-pressure chronic ulcer of other part of left lower leg limited to breakdown of skin L97.811 Non-pressure chronic ulcer of other part of right lower leg limited to breakdown of skin I70.213 Atherosclerosis of native arteries of extremities with intermittent claudication, bilateral legs Wound Cleansing Wound #3 Left,Lateral Lower Leg o Clean wound with Normal Saline. Primary Wound Dressing Wound #3 Left,Lateral Lower Leg o Silver Collagen - moisten with hydrogel Secondary Dressing Wound #3 Left,Lateral Lower  Leg o ABD pad Dressing Change Frequency Wound #3 Left,Lateral Lower Leg o Change dressing every week Follow-up Appointments Wound #3 Left,Lateral Lower Leg o Return Appointment in 1 week. Edema Control Wound #3 Left,Lateral Lower Leg o 3 Layer Compression System - Left Lower Extremity o Elevate legs to the level of the heart and pump ankles as often as possible Electronic Signature(s) Signed: 04/16/2020 4:32:38 PM By: Montey Hora Signed: 04/16/2020 6:04:00 PM By: Worthy Keeler PA-C Entered By: Montey Hora on 04/16/2020 15:10:21 Mory, [name] ([id number]) -------------------------------------------------------------------------------- Problem List Details Patient Name: [name], [name] Date of Service: 04/16/2020 3:00 PM Medical Record Number: [id number] Patient Account Number: [id number] Date of Birth/Sex: [birth year] (86 y.o. M) Treating RN: Montey Hora Primary Care Provider: Lavera Guise Other Clinician: Referring Provider: Lavera Guise Treating Provider/Extender: Melburn Hake, Norely Schlick Weeks in Treatment: 6 Active Problems ICD-10 Encounter Code Description Active Date MDM Diagnosis I87.333 Chronic venous hypertension (idiopathic) with ulcer and inflammation of 03/04/2020 No Yes bilateral lower extremity L97.821 Non-pressure chronic ulcer of other part of left lower leg limited to 03/04/2020 No Yes breakdown of skin L97.811 Non-pressure chronic ulcer of other part of right lower leg limited to 03/04/2020 No Yes breakdown of skin I70.213 Atherosclerosis of native arteries of extremities with intermittent 03/04/2020 No Yes claudication, bilateral legs Inactive Problems Resolved Problems Electronic Signature(s) Signed: 04/16/2020 3:00:43 PM By: Worthy Keeler PA-C Entered By: Worthy Keeler on 04/16/2020 15:00:42 [name], [name] -------------------------------------------------------------------------------- Progress Note Details Patient Name: [name] Mole  B. Date of Service: 04/16/2020 3:00 PM Medical Record Number: [id number] Patient Account Number: [id number] Date of Birth/Sex: Feb 05, 1932 (86 y.o. M) Treating RN: Montey Hora Primary Care Provider: Lavera Guise Other Clinician: Referring Provider: Lavera Guise Treating Provider/Extender: Melburn Hake, Perlie Scheuring Weeks in Treatment: 6 Subjective Chief Complaint Information obtained from Patient 03/04/2020; patient is here for review of bilateral small lower extremity wounds History of Present Illness (HPI) ADMISSION 03/04/2020 Patient is an 86 year old man who  arrives accompanied by his wife. He states that he has had wounds on his bilateral lower extremities that started with increased swelling in his lower extremities. He has quite significant bilateral lower extremity pitting edema but he states that this is only been there for 2 or 3 weeks. Says he saw his primary doctor who put him on what sounds like a diuretic. He has developed small wounds on the left lateral which is actually the most prominent wound. Very small superficial wounds on the left anterior and a cluster of very small wounds on the right lateral. These are not particularly painful although he states that his legs are itchy. on arrival in the clinic our intake nurse could not obtain pulses bilaterally. She could not maintain of enough of a Doppler in the right dorsalis pedis to get an ABI and on the left she could not even obtain it by Doppler. The patient does have pain in his legs when he walks. He says 10 to 15 minutes he has to sit down and rest although he thought that this was because of his knees. He also mentions that his wounds are painful at night when he is lying in bed sometimes waking him up at 5:00 in the morning. Past medical history includes hypertension, CVA in 2011 with no residual effects, osteoarthritis of both knees, recent seizure disorder but apparently complete work-up was negative, ITP, vitamin B12 deficiency  and stage III chronic renal failure We could not obtain ABIs in our clinic as noted above 03/11/2020 upon evaluation today patient actually appears to be doing much better in regard to his bilateral lower extremities. Fortunately there is no signs of infection. He actually saw Dr. Dellia Nims during his initial visit last week and he was treated very appropriately his wounds appear to be doing much better. Also don't have the complete evaluation as far as his arterial study is concerned that the vascular doctor has not read it yet. With that being said it did appear that the patient's findings were consistent with being okay. His TBI's were somewhat low at 0.39 on the right and 0.49 on the left with an ABI of 1.15 on the right and 1.35 on the left. With that being said I think that the fact that he is healing and has tolerated the wrap very well at this point is a good indication that he is good to do just fine. 03/18/2020 upon evaluation today patient appears to be doing pretty well in regard to his leg ulcer on the left. Fortunately there is no signs of active infection at this time. No fevers, chills, nausea, vomiting, or diarrhea. I am actually rather pleased with how things seem to be progressing. Overall the patient's wound I think is ready for collagen I do not believe there is enough drainage to warrant having to continue with the alginate at this point. 5/13; small wound on the left lateral lower leg. We are using silver collagen. Not much improvement this week in surface area or condition of the wound. Wrapping and kerlix Coban. Previous ABI was noncompressible 5/20; the patient's wound on the left lateral lower leg is smaller. We have been using silver collagen. I increased him from 2-3 layer compressions last week. Edema control is better Since he was last here he tells me he was in the [facility] for 48 hours with what sounds like a TIA. I have not checked these records 04/09/2020 on  evaluation today patient appears to be doing well with  regard to his wound. This is measuring significantly better and seems to be doing excellent. The collagen I think is still the best option for him at this point. Fortunately there is no evidence of active infection at this time. 04/16/2020 upon evaluation today patient appears to be doing excellent in regard to his lower extremity ulcer. In fact this is measuring quite a bit smaller and overall I feel like he is doing very well in that regard. There is no signs of active infection at this time. No fevers, chills, nausea, vomiting, or diarrhea. I think he is very close to complete resolution. Objective Constitutional Well-nourished and well-hydrated in no acute distress. Vitals Time Taken: 2:40 PM, Height: 69 in, Weight: 173 lbs, BMI: 25.5, Temperature: 98.1 F, Pulse: 63 bpm, Respiratory Rate: 18 breaths/min, [name], [name] ([id number]) Blood Pressure: 145/73 mmHg. Respiratory normal breathing without difficulty. Psychiatric this patient is able to make decisions and demonstrates good insight into disease process. Alert and Oriented x 3. pleasant and cooperative. General Notes: On inspection patient's wound bed actually showed signs of excellent granulation and epithelization in fact this is almost completely closed though not quite. I think he still can require little bit more intervention at this point before this will be completely resolved. Nonetheless I am very pleased with where things stand today. Integumentary (Hair, Skin) Wound #3 status is Open. Original cause of wound was Gradually Appeared. The wound is located on the Left,Lateral Lower Leg. The wound measures 0.5cm length x 0.2cm width x 0.1cm depth; 0.079cm^2 area and 0.008cm^3 volume. There is Fat Layer (Subcutaneous Tissue) Exposed exposed. There is a medium amount of serous drainage noted. The wound margin is flat and intact. There is large (67-100%) pink granulation within  the wound bed. There is a small (1-33%) amount of necrotic tissue within the wound bed including Adherent Slough. Assessment Active Problems ICD-10 Chronic venous hypertension (idiopathic) with ulcer and inflammation of bilateral lower extremity Non-pressure chronic ulcer of other part of left lower leg limited to breakdown of skin Non-pressure chronic ulcer of other part of right lower leg limited to breakdown of skin Atherosclerosis of native arteries of extremities with intermittent claudication, bilateral legs Procedures Wound #3 Pre-procedure diagnosis of Wound #3 is a Venous Leg Ulcer located on the Left,Lateral Lower Leg .Severity of Tissue Pre Debridement is: Fat layer exposed. There was a Excisional Skin/Subcutaneous Tissue Debridement with a total area of 0.1 sq cm performed by STONE III, Cranford Blessinger E., PA-C. With the following instrument(s): Curette to remove Viable and Non-Viable tissue/material. Material removed includes Subcutaneous Tissue, Slough, Skin: Dermis, and Skin: Epidermis after achieving pain control using Lidocaine 4% Topical Solution. No specimens were taken. A time out was conducted at 15:07, prior to the start of the procedure. A Minimum amount of bleeding was controlled with Pressure. The procedure was tolerated well with a pain level of 0 throughout and a pain level of 0 following the procedure. Post Debridement Measurements: 0.5cm length x 0.2cm width x 0.2cm depth; 0.016cm^3 volume. Character of Wound/Ulcer Post Debridement is improved. Severity of Tissue Post Debridement is: Fat layer exposed. Post procedure Diagnosis Wound #3: Same as Pre-Procedure Pre-procedure diagnosis of Wound #3 is a Venous Leg Ulcer located on the Left,Lateral Lower Leg . There was a Three Layer Compression Therapy Procedure with a pre-treatment ABI of 1.4 by Montey Hora, RN. Post procedure Diagnosis Wound #3: Same as Pre-Procedure Plan Wound Cleansing: Wound #3 Left,Lateral Lower  Leg: Clean wound with Normal Saline. Primary Wound  Dressing: Wound #3 Left,Lateral Lower Leg: Silver Collagen - moisten with hydrogel Secondary Dressing: Wound #3 Left,Lateral Lower Leg: ABD pad Dressing Change Frequency: Wound #3 Left,Lateral Lower Leg: Change dressing every week [name], [name] ([id number]) Follow-up Appointments: Wound #3 Left,Lateral Lower Leg: Return Appointment in 1 week. Edema Control: Wound #3 Left,Lateral Lower Leg: 3 Layer Compression System - Left Lower Extremity Elevate legs to the level of the heart and pump ankles as often as possible 1. I would recommend currently that we continue with the wound care measures as before. This includes utilization of the silver collagen dressing followed by the 3 layer compression wrap which is doing excellent for him. 2. I am also can recommend the patient continue to elevate his legs as much as possible. I was to keep him under good control as far as edema is concerned is but most importance. 3. I am also can recommend currently that we continue to monitor for any signs of worsening infection or otherwise I really do not see anything at this point. We will see patient back for reevaluation in 1 week here in the clinic. If anything worsens or changes patient will contact our office for additional recommendations. Electronic Signature(s) Signed: 04/16/2020 4:33:14 PM By: Worthy Keeler PA-C Entered By: Worthy Keeler on 04/16/2020 16:33:13 [name], [name] ([id number]) -------------------------------------------------------------------------------- SuperBill Details Patient Name: [name] Mole B. Date of Service: 04/16/2020 Medical Record Number: [id number] Patient Account Number: [id number] Date of Birth/Sex: 1932/05/20 (86 y.o. M) Treating RN: Montey Hora Primary Care Provider: Lavera Guise Other Clinician: Referring Provider: Lavera Guise Treating Provider/Extender: Melburn Hake, Jahdai Padovano Weeks in Treatment: 6 Diagnosis  Coding ICD-10 Codes Code Description I87.333 Chronic venous hypertension (idiopathic) with ulcer and inflammation of bilateral lower extremity L97.821 Non-pressure chronic ulcer of other part of left lower leg limited to breakdown of skin L97.811 Non-pressure chronic ulcer of other part of right lower leg limited to breakdown of skin I70.213 Atherosclerosis of native arteries of extremities with intermittent claudication, bilateral legs Facility Procedures CPT4 Code: [id number] Description: 58850 - DEB SUBQ TISSUE 20 SQ CM/< Modifier: Quantity: 1 CPT4 Code: Description: ICD-10 Diagnosis Description L97.821 Non-pressure chronic ulcer of other part of left lower leg limited to breakd Modifier: own of skin Quantity: Physician Procedures CPT4 Code: [id number] Description: 11042 - WC PHYS SUBQ TISS 20 SQ CM Modifier: Quantity: 1 CPT4 Code: Description: ICD-10 Diagnosis Description L97.821 Non-pressure chronic ulcer of other part of left lower leg limited to breakd Modifier: own of skin Quantity: Electronic Signature(s) Signed: 04/16/2020 4:33:36 PM By: Worthy Keeler PA-C Entered By: Worthy Keeler on 04/16/2020 16:33:35

## 2020-04-22 ENCOUNTER — Other Ambulatory Visit: Payer: Self-pay

## 2020-04-22 ENCOUNTER — Encounter: Payer: PPO | Admitting: Physician Assistant

## 2020-04-22 DIAGNOSIS — I87333 Chronic venous hypertension (idiopathic) with ulcer and inflammation of bilateral lower extremity: Secondary | ICD-10-CM | POA: Diagnosis not present

## 2020-04-22 DIAGNOSIS — L97811 Non-pressure chronic ulcer of other part of right lower leg limited to breakdown of skin: Secondary | ICD-10-CM | POA: Diagnosis not present

## 2020-04-22 DIAGNOSIS — I70213 Atherosclerosis of native arteries of extremities with intermittent claudication, bilateral legs: Secondary | ICD-10-CM | POA: Diagnosis not present

## 2020-04-22 DIAGNOSIS — L97821 Non-pressure chronic ulcer of other part of left lower leg limited to breakdown of skin: Secondary | ICD-10-CM | POA: Diagnosis not present

## 2020-04-22 NOTE — Progress Notes (Addendum)
[name], [name] ([id number]) Visit Report for 04/22/2020 Chief Complaint Document Details Patient Name: [name], [name] Date of Service: 04/22/2020 1:30 PM Medical Record Number: [id number] Patient Account Number: [id number] Date of Birth/Sex: Apr 07, 1932 (86 y.o. M) Treating RN: [name] Primary Care Provider: Lavera Guise Other Clinician: Referring Provider: Lavera Guise Treating Provider/Extender: Melburn Hake, Verita Kuroda Weeks in Treatment: 7 Information Obtained from: Patient Chief Complaint 03/04/2020; patient is here for review of bilateral small lower extremity wounds Electronic Signature(s) Signed: 04/22/2020 1:24:44 PM By: Worthy Keeler PA-C Entered By: Worthy Keeler on 04/22/2020 13:24:44 Tristan, [name] ([id number]) -------------------------------------------------------------------------------- HPI Details Patient Name: [name] Mole B. Date of Service: 04/22/2020 1:30 PM Medical Record Number: [id number] Patient Account Number: [id number] Date of Birth/Sex: 26-Mar-1932 (86 y.o. M) Treating RN: [name] Primary Care Provider: Lavera Guise Other Clinician: Referring Provider: Lavera Guise Treating Provider/Extender: Melburn Hake, Diarra Ceja Weeks in Treatment: 7 History of Present Illness HPI Description: ADMISSION 03/04/2020 Patient is an 86 year old man who arrives accompanied by his wife. He states that he has had wounds on his bilateral lower extremities that started with increased swelling in his lower extremities. He has quite significant bilateral lower extremity pitting edema but he states that this is only been there for 2 or 3 weeks. Says he saw his primary doctor who put him on what sounds like a diuretic. He has developed small wounds on the left lateral which is actually the most prominent wound. Very small superficial wounds on the left anterior and a cluster of very small wounds on the right lateral. These are not particularly painful although he states that his legs are  itchy. on arrival in the clinic our intake nurse could not obtain pulses bilaterally. She could not maintain of enough of a Doppler in the right dorsalis pedis to get an ABI and on the left she could not even obtain it by Doppler. The patient does have pain in his legs when he walks. He says 10 to 15 minutes he has to sit down and rest although he thought that this was because of his knees. He also mentions that his wounds are painful at night when he is lying in bed sometimes waking him up at 5:00 in the morning. Past medical history includes hypertension, CVA in 2011 with no residual effects, osteoarthritis of both knees, recent seizure disorder but apparently complete work-up was negative, ITP, vitamin B12 deficiency and stage III chronic renal failure We could not obtain ABIs in our clinic as noted above 03/11/2020 upon evaluation today patient actually appears to be doing much better in regard to his bilateral lower extremities. Fortunately there is no signs of infection. He actually saw Dr. Dellia Nims during his initial visit last week and he was treated very appropriately his wounds appear to be doing much better. Also don't have the complete evaluation as far as his arterial study is concerned that the vascular doctor has not read it yet. With that being said it did appear that the patient's findings were consistent with being okay. His TBI's were somewhat low at 0.39 on the right and 0.49 on the left with an ABI of 1.15 on the right and 1.35 on the left. With that being said I think that the fact that he is healing and has tolerated the wrap very well at this point is a good indication that he is good to do just fine. 03/18/2020 upon evaluation today patient appears to be doing pretty well in regard to his leg  ulcer on the left. Fortunately there is no signs of active infection at this time. No fevers, chills, nausea, vomiting, or diarrhea. I am actually rather pleased with how things seem to be  progressing. Overall the patient's wound I think is ready for collagen I do not believe there is enough drainage to warrant having to continue with the alginate at this point. 5/13; small wound on the left lateral lower leg. We are using silver collagen. Not much improvement this week in surface area or condition of the wound. Wrapping and kerlix Coban. Previous ABI was noncompressible 5/20; the patient's wound on the left lateral lower leg is smaller. We have been using silver collagen. I increased him from 2-3 layer compressions last week. Edema control is better Since he was last here he tells me he was in the [facility] for 48 hours with what sounds like a TIA. I have not checked these records 04/09/2020 on evaluation today patient appears to be doing well with regard to his wound. This is measuring significantly better and seems to be doing excellent. The collagen I think is still the best option for him at this point. Fortunately there is no evidence of active infection at this time. 04/16/2020 upon evaluation today patient appears to be doing excellent in regard to his lower extremity ulcer. In fact this is measuring quite a bit smaller and overall I feel like he is doing very well in that regard. There is no signs of active infection at this time. No fevers, chills, nausea, vomiting, or diarrhea. I think he is very close to complete resolution. 04/22/2020 upon evaluation today patient actually appears to be doing excellent in regard to his leg ulcer today. In fact this appears to be completely healed based on what I am seeing. I may want to put a protective dressing over it for just a couple of days just to ensure that nothing drains or causes him any issues. Electronic Signature(s) Signed: 04/22/2020 4:15:12 PM By: Worthy Keeler PA-C Entered By: Worthy Keeler on 04/22/2020 [place], [name]  ([id number]) -------------------------------------------------------------------------------- Physical Exam Details Patient Name: [name], [name] Date of Service: 04/22/2020 1:30 PM Medical Record Number: [id number] Patient Account Number: [id number] Date of Birth/Sex: December 24, 1931 (86 y.o. M) Treating RN: [name] Primary Care Provider: Lavera Guise Other Clinician: Referring Provider: Lavera Guise Treating Provider/Extender: STONE III, Jamir Rone Weeks in Treatment: 7 Constitutional Well-nourished and well-hydrated in no acute distress. Respiratory normal breathing without difficulty. Psychiatric this patient is able to make decisions and demonstrates good insight into disease process. Alert and Oriented x 3. pleasant and cooperative. Notes Upon inspection patient's wound bed actually showed signs of good granulation at this time there is no evidence of active infection and overall very pleased with where things stand. Electronic Signature(s) Signed: 04/22/2020 4:15:34 PM By: Worthy Keeler PA-C Entered By: Worthy Keeler on 04/22/2020 16:15:33 Bin, [name] ([id number]) -------------------------------------------------------------------------------- Physician Orders Details Patient Name: DAQUARIUS, DUBEAU B. Date of Service: 04/22/2020 1:30 PM Medical Record Number: [id number] Patient Account Number: [id number] Date of Birth/Sex: August 10, 1932 (86 y.o. M) Treating RN: [name] Primary Care Provider: Lavera Guise Other Clinician: Referring Provider: Lavera Guise Treating Provider/Extender: Melburn Hake, Carleena Mires Weeks in Treatment: 7 Verbal / Phone Orders: No Diagnosis Coding ICD-10 Coding Code Description I87.333 Chronic venous hypertension (idiopathic) with ulcer and inflammation of bilateral lower extremity L97.821 Non-pressure chronic ulcer of other part of left lower leg limited to breakdown of skin L97.811 Non-pressure chronic ulcer of  other part of right lower leg limited to breakdown of  skin I70.213 Atherosclerosis of native arteries of extremities with intermittent claudication, bilateral legs Secondary Dressing Wound #3 Left,Lateral Lower Leg o Foam o Tegaderm Discharge From [facility] Services Wound #3 Left,Lateral Lower Leg o Discharge from [place] - treatment complete Electronic Signature(s) Signed: 04/22/2020 2:52:59 PM By: [name] Signed: 04/22/2020 4:30:42 PM By: Worthy Keeler PA-C Entered By: [name] on 04/22/2020 13:54:39 [place], [name] ([id number]) -------------------------------------------------------------------------------- Problem List Details Patient Name: JOSSIAH, SMOAK B. Date of Service: 04/22/2020 1:30 PM Medical Record Number: [id number] Patient Account Number: [id number] Date of Birth/Sex: 1932/03/11 (86 y.o. M) Treating RN: [name] Primary Care Provider: Lavera Guise Other Clinician: Referring Provider: Lavera Guise Treating Provider/Extender: Melburn Hake, Destanie Tibbetts Weeks in Treatment: 7 Active Problems ICD-10 Encounter Code Description Active Date MDM Diagnosis I87.333 Chronic venous hypertension (idiopathic) with ulcer and inflammation of 03/04/2020 No Yes bilateral lower extremity L97.821 Non-pressure chronic ulcer of other part of left lower leg limited to 03/04/2020 No Yes breakdown of skin L97.811 Non-pressure chronic ulcer of other part of right lower leg limited to 03/04/2020 No Yes breakdown of skin I70.213 Atherosclerosis of native arteries of extremities with intermittent 03/04/2020 No Yes claudication, bilateral legs Inactive Problems Resolved Problems Electronic Signature(s) Signed: 04/22/2020 1:24:40 PM By: Worthy Keeler PA-C Entered By: Worthy Keeler on 04/22/2020 13:24:39 [place], [name] ([id number]) -------------------------------------------------------------------------------- Progress Note Details Patient Name: [name] Mole B. Date of Service: 04/22/2020 1:30 PM Medical Record Number:  [id number] Patient Account Number: [id number] Date of Birth/Sex: 20-Sep-1932 (86 y.o. M) Treating RN: [name] Primary Care Provider: Lavera Guise Other Clinician: Referring Provider: Lavera Guise Treating Provider/Extender: Melburn Hake, Taeden Geller Weeks in Treatment: 7 Subjective Chief Complaint Information obtained from Patient 03/04/2020; patient is here for review of bilateral small lower extremity wounds History of Present Illness (HPI) ADMISSION 03/04/2020 Patient is an 86 year old man who arrives accompanied by his wife. He states that he has had wounds on his bilateral lower extremities that started with increased swelling in his lower extremities. He has quite significant bilateral lower extremity pitting edema but he states that this is only been there for 2 or 3 weeks. Says he saw his primary doctor who put him on what sounds like a diuretic. He has developed small wounds on the left lateral which is actually the most prominent wound. Very small superficial wounds on the left anterior and a cluster of very small wounds on the right lateral. These are not particularly painful although he states that his legs are itchy. on arrival in the clinic our intake nurse could not obtain pulses bilaterally. She could not maintain of enough of a Doppler in the right dorsalis pedis to get an ABI and on the left she could not even obtain it by Doppler. The patient does have pain in his legs when he walks. He says 10 to 15 minutes he has to sit down and rest although he thought that this was because of his knees. He also mentions that his wounds are painful at night when he is lying in bed sometimes waking him up at 5:00 in the morning. Past medical history includes hypertension, CVA in 2011 with no residual effects, osteoarthritis of both knees, recent seizure disorder but apparently complete work-up was negative, ITP, vitamin B12 deficiency and stage III chronic renal failure We could not obtain ABIs in  our clinic as noted above 03/11/2020 upon evaluation today patient actually appears to be doing  much better in regard to his bilateral lower extremities. Fortunately there is no signs of infection. He actually saw Dr. Dellia Nims during his initial visit last week and he was treated very appropriately his wounds appear to be doing much better. Also don't have the complete evaluation as far as his arterial study is concerned that the vascular doctor has not read it yet. With that being said it did appear that the patient's findings were consistent with being okay. His TBI's were somewhat low at 0.39 on the right and 0.49 on the left with an ABI of 1.15 on the right and 1.35 on the left. With that being said I think that the fact that he is healing and has tolerated the wrap very well at this point is a good indication that he is good to do just fine. 03/18/2020 upon evaluation today patient appears to be doing pretty well in regard to his leg ulcer on the left. Fortunately there is no signs of active infection at this time. No fevers, chills, nausea, vomiting, or diarrhea. I am actually rather pleased with how things seem to be progressing. Overall the patient's wound I think is ready for collagen I do not believe there is enough drainage to warrant having to continue with the alginate at this point. 5/13; small wound on the left lateral lower leg. We are using silver collagen. Not much improvement this week in surface area or condition of the wound. Wrapping and kerlix Coban. Previous ABI was noncompressible 5/20; the patient's wound on the left lateral lower leg is smaller. We have been using silver collagen. I increased him from 2-3 layer compressions last week. Edema control is better Since he was last here he tells me he was in the [facility] for 48 hours with what sounds like a TIA. I have not checked these records 04/09/2020 on evaluation today patient appears to be doing well with regard to his  wound. This is measuring significantly better and seems to be doing excellent. The collagen I think is still the best option for him at this point. Fortunately there is no evidence of active infection at this time. 04/16/2020 upon evaluation today patient appears to be doing excellent in regard to his lower extremity ulcer. In fact this is measuring quite a bit smaller and overall I feel like he is doing very well in that regard. There is no signs of active infection at this time. No fevers, chills, nausea, vomiting, or diarrhea. I think he is very close to complete resolution. 04/22/2020 upon evaluation today patient actually appears to be doing excellent in regard to his leg ulcer today. In fact this appears to be completely healed based on what I am seeing. I may want to put a protective dressing over it for just a couple of days just to ensure that nothing drains or causes him any issues. Objective Pineda, Daishaun B. ([id number]) Constitutional Well-nourished and well-hydrated in no acute distress. Vitals Time Taken: 1:35 PM, Height: 69 in, Weight: 173 lbs, BMI: 25.5, Temperature: 98.1 F, Pulse: 62 bpm, Respiratory Rate: 18 breaths/min, Blood Pressure: 132/75 mmHg. Respiratory normal breathing without difficulty. Psychiatric this patient is able to make decisions and demonstrates good insight into disease process. Alert and Oriented x 3. pleasant and cooperative. General Notes: Upon inspection patient's wound bed actually showed signs of good granulation at this time there is no evidence of active infection and overall very pleased with where things stand. Integumentary (Hair, Skin) Wound #3 status is Healed -  Epithelialized. Original cause of wound was Gradually Appeared. The wound is located on the Left,Lateral Lower Leg. The wound measures 0cm length x 0cm width x 0cm depth; 0cm^2 area and 0cm^3 volume. There is Fat Layer (Subcutaneous Tissue) Exposed exposed. There is no tunneling or  undermining noted. There is a medium amount of serous drainage noted. The wound margin is flat and intact. There is large (67-100%) pink granulation within the wound bed. There is a small (1-33%) amount of necrotic tissue within the wound bed including Adherent Slough. Assessment Active Problems ICD-10 Chronic venous hypertension (idiopathic) with ulcer and inflammation of bilateral lower extremity Non-pressure chronic ulcer of other part of left lower leg limited to breakdown of skin Non-pressure chronic ulcer of other part of right lower leg limited to breakdown of skin Atherosclerosis of native arteries of extremities with intermittent claudication, bilateral legs Plan Secondary Dressing: Wound #3 Left,Lateral Lower Leg: Foam Tegaderm Discharge From [facility] Services: Wound #3 Left,Lateral Lower Leg: Discharge from [place] - treatment complete 1. I would recommend that we go ahead and discontinue wound care therapy at this point as the patient is healed and doing excellent. I would suggest that he use a protective dressing which we will place on there for him today for the next 1-3 days in order to ensure everything is okay and then after that he can remove it and then go without addressing just using his compression socks. 2. He does need to wear his compression socks throughout the day he really should not take this off at any time except for when he is showering or sleeping at night. We will see him back for follow-up visit as needed. Electronic Signature(s) Signed: 04/22/2020 4:16:08 PM By: Worthy Keeler PA-C Entered By: Worthy Keeler on 04/22/2020 16:16:08 [place], [name] ([id number]) -------------------------------------------------------------------------------- SuperBill Details Patient Name: [name] Mole B. Date of Service: 04/22/2020 Medical Record Number: [id number] Patient Account Number: [id number] Date of Birth/Sex: [birth year] (86 y.o. M) Treating RN: [name] Primary Care Provider: Lavera Guise Other Clinician: Referring Provider: Lavera Guise Treating Provider/Extender: Melburn Hake, Jonte Shiller Weeks in Treatment: 7 Diagnosis Coding ICD-10 Codes Code Description I87.333 Chronic venous hypertension (idiopathic) with ulcer and inflammation of bilateral lower extremity L97.821 Non-pressure chronic ulcer of other part of left lower leg limited to breakdown of skin L97.811 Non-pressure chronic ulcer of other part of right lower leg limited to breakdown of skin I70.213 Atherosclerosis of native arteries of extremities with intermittent claudication, bilateral legs Facility Procedures CPT4 Code: [id number] Description: 99213 - WOUND CARE VISIT-LEV 3 EST PT Modifier: Quantity: 1 Physician Procedures CPT4 Code Description: [id number] 99213 - WC PHYS LEVEL 3 - EST PT Modifier: Quantity: 1 CPT4 Code Description: ICD-10 Diagnosis Description I87.333 Chronic venous hypertension (idiopathic) with ulcer and inflammation of bila L97.821 Non-pressure chronic ulcer of other part of left lower leg limited to breakd I70.213 Atherosclerosis of  native arteries of extremities with intermittent claudica L97.811 Non-pressure chronic ulcer of other part of right lower leg limited to break Modifier: teral lower extre own of skin tion, bilateral l down of skin Quantity: mity egs Electronic Signature(s) Signed: 04/22/2020 4:16:18 PM By: Worthy Keeler PA-C Entered By: Worthy Keeler on 04/22/2020 16:16:18

## 2020-04-22 NOTE — Progress Notes (Addendum)
BHARGAV, BARBARO ([id number]) Visit Report for 04/22/2020 Arrival Information Details Patient Name: [name], [name]. Date of Service: 04/22/2020 1:30 PM Medical Record Number: [id number] Patient Account Number: [id number] Date of Birth/Sex: 1932-07-06 (86 y.o. M) Treating RN: [name] Primary Care Arville Postlewaite: Lavera Guise Other Clinician: Referring Zynia Wojtowicz: Lavera Guise Treating Brandan Robicheaux/Extender: Melburn Hake, HOYT Weeks in Treatment: 7 Visit Information History Since Last Visit Added or deleted any medications: No Patient Arrived: Ambulatory Any new allergies or adverse reactions: No Arrival Time: 13:34 Had a fall or experienced change in No Accompanied By: wife activities of daily living that may affect Transfer Assistance: None risk of falls: Patient Identification Verified: Yes Signs or symptoms of abuse/neglect since last visito No Secondary Verification Process Completed: Yes Hospitalized since last visit: No Patient Has Alerts: Yes Implantable device outside of the clinic excluding No Patient Alerts: Patient on Blood Thinner cellular tissue based products placed in the center Aspirin 325 since last visit: Has Dressing in Place as Prescribed: Yes Pain Present Now: No Electronic Signature(s) Signed: 04/22/2020 3:09:28 PM By: Lorine Bears RCP, RRT, CHT Entered By: Lorine Bears on 04/22/2020 13:35:12 [name] ([id number]) -------------------------------------------------------------------------------- Clinic Level of Care Assessment Details Patient Name: [name], [name] [place] B. Date of Service: 04/22/2020 1:30 PM Medical Record Number: [id number] Patient Account Number: [id number] Date of Birth/Sex: [birth year] (86 y.o. M) Treating RN: [name] Primary Care Torion Hulgan: Lavera Guise Other Clinician: Referring Tanza Pellot: Lavera Guise Treating Jaimy Kliethermes/Extender: Melburn Hake, HOYT Weeks in Treatment: 7 Clinic Level of Care Assessment Items TOOL 4  Quantity Score []  - Use when only an EandM is performed on FOLLOW-UP visit 0 ASSESSMENTS - Nursing Assessment / Reassessment X - Reassessment of Co-morbidities (includes updates in patient status) 1 10 X- 1 5 Reassessment of Adherence to Treatment Plan ASSESSMENTS - Wound and Skin Assessment / Reassessment X - Simple Wound Assessment / Reassessment - one wound 1 5 []  - 0 Complex Wound Assessment / Reassessment - multiple wounds []  - 0 Dermatologic / Skin Assessment (not related to wound area) ASSESSMENTS - Focused Assessment X - Circumferential Edema Measurements - multi extremities 1 5 []  - 0 Nutritional Assessment / Counseling / Intervention X- 1 5 Lower Extremity Assessment (monofilament, tuning fork, pulses) []  - 0 Peripheral Arterial Disease Assessment (using hand held doppler) ASSESSMENTS - Ostomy and/or Continence Assessment and Care []  - Incontinence Assessment and Management 0 []  - 0 Ostomy Care Assessment and Management (repouching, etc.) PROCESS - Coordination of Care X - Simple Patient / Family Education for ongoing care 1 15 []  - 0 Complex (extensive) Patient / Family Education for ongoing care X- 1 10 Staff obtains Programmer, systems, Records, Test Results / Process Orders []  - 0 Staff telephones HHA, Nursing Homes / Clarify orders / etc []  - 0 Routine Transfer to another Facility (non-emergent condition) []  - 0 Routine Hospital Admission (non-emergent condition) []  - 0 New Admissions / Biomedical engineer / Ordering NPWT, Apligraf, etc. []  - 0 Emergency Hospital Admission (emergent condition) X- 1 10 Simple Discharge Coordination []  - 0 Complex (extensive) Discharge Coordination PROCESS - Special Needs []  - Pediatric / Minor Patient Management 0 []  - 0 Isolation Patient Management []  - 0 Hearing / Language / Visual special needs []  - 0 Assessment of Community assistance (transportation, D/C planning, etc.) []  - 0 Additional assistance / Altered  mentation []  - 0 Support Surface(s) Assessment (bed, cushion, seat, etc.) INTERVENTIONS - Wound Cleansing / Measurement [name], [name] ([id number]) X- 1 5 Simple Wound Cleansing -  one wound []  - 0 Complex Wound Cleansing - multiple wounds X- 1 5 Wound Imaging (photographs - any number of wounds) []  - 0 Wound Tracing (instead of photographs) X- 1 5 Simple Wound Measurement - one wound []  - 0 Complex Wound Measurement - multiple wounds INTERVENTIONS - Wound Dressings []  - Small Wound Dressing one or multiple wounds 0 []  - 0 Medium Wound Dressing one or multiple wounds []  - 0 Large Wound Dressing one or multiple wounds []  - 0 Application of Medications - topical []  - 0 Application of Medications - injection INTERVENTIONS - Miscellaneous []  - External ear exam 0 []  - 0 Specimen Collection (cultures, biopsies, blood, body fluids, etc.) []  - 0 Specimen(s) / Culture(s) sent or taken to Lab for analysis []  - 0 Patient Transfer (multiple staff / Civil Service fast streamer / Similar devices) []  - 0 Simple Staple / Suture removal (25 or less) []  - 0 Complex Staple / Suture removal (26 or more) []  - 0 Hypo / Hyperglycemic Management (close monitor of Blood Glucose) []  - 0 Ankle / Brachial Index (ABI) - do not check if billed separately X- 1 5 Vital Signs Has the patient been seen at the hospital within the last three years: Yes Total Score: 85 Level Of Care: New/Established - Level 3 Electronic Signature(s) Signed: 04/22/2020 2:52:59 PM By: [name] Entered By: [name] on 04/22/2020 13:55:33 [name] ([id number]) -------------------------------------------------------------------------------- Encounter Discharge Information Details Patient Name: [name] Mole B. Date of Service: 04/22/2020 1:30 PM Medical Record Number: [id number] Patient Account Number: [id number] Date of Birth/Sex: [birth year] (86 y.o. M) Treating RN: [name] Primary Care Rayvin Abid: Lavera Guise Other  Clinician: Referring Valentine Kuechle: Lavera Guise Treating Anes Rigel/Extender: Melburn Hake, HOYT Weeks in Treatment: 7 Encounter Discharge Information Items Discharge Condition: Stable Ambulatory Status: Ambulatory Discharge Destination: Home Transportation: Private Auto Accompanied By: wife Schedule Follow-up Appointment: Yes Clinical Summary of Care: Electronic Signature(s) Signed: 04/22/2020 2:52:59 PM By: [name] Entered By: [name] on 04/22/2020 13:56:05 Stegenga, Gwenyth [name] ([id number]) -------------------------------------------------------------------------------- Lower Extremity Assessment Details Patient Name: [name] Mole B. Date of Service: 04/22/2020 1:30 PM Medical Record Number: [id number] Patient Account Number: [id number] Date of Birth/Sex: 1932/01/21 (86 y.o. M) Treating RN: Cornell Barman Primary Care Nyeshia Mysliwiec: Lavera Guise Other Clinician: Referring Khrista Braun: Lavera Guise Treating Kamee Bobst/Extender: Melburn Hake, HOYT Weeks in Treatment: 7 Edema Assessment Assessed: [Left: No] [Right: No] Edema: [Left: Ye] [Right: s] Calf Left: Right: Point of Measurement: 32 cm From Medial Instep 34 cm cm Ankle Left: Right: Point of Measurement: 10 cm From Medial Instep 26.5 cm cm Vascular Assessment Pulses: Dorsalis Pedis Palpable: [Left:Yes] Electronic Signature(s) Signed: 04/23/2020 12:58:54 PM By: Gretta Cool, BSN, RN, CWS, Kim RN, BSN Entered By: Gretta Cool, BSN, RN, CWS, Kim on 04/22/2020 13:45:02 Ringley, Gwenyth [name] ([id number]) -------------------------------------------------------------------------------- Multi Wound Chart Details Patient Name: [name] Mole B. Date of Service: 04/22/2020 1:30 PM Medical Record Number: [id number] Patient Account Number: [id number] Date of Birth/Sex: 07-09-1932 (86 y.o. M) Treating RN: [name] Primary Care Sreeja Spies: Lavera Guise Other Clinician: Referring Ernie Kasler: Lavera Guise Treating Ameerah Huffstetler/Extender: Melburn Hake, HOYT Weeks in Treatment: 7 Vital  Signs Height(in): 69 Pulse(bpm): 58 Weight(lbs): 173 Blood Pressure(mmHg): 132/75 Body Mass Index(BMI): 26 Temperature(F): 98.1 Respiratory Rate(breaths/min): 18 Photos: [N/A:N/A] Wound Location: Left, Lateral Lower Leg N/A N/A Wounding Event: Gradually Appeared N/A N/A Primary Etiology: Venous Leg Ulcer N/A N/A Comorbid History: Hypertension, End Stage Renal N/A N/A Disease, Osteoarthritis, Seizure Disorder Date Acquired: 02/19/2020 N/A N/A Weeks of Treatment: 7 N/A N/A Wound Status: Open  N/A N/A Wound Recurrence: Yes N/A N/A Measurements L x W x D (cm) 0.1x0.1x0.1 N/A N/A Area (cm) : 0.008 N/A N/A Volume (cm) : 0.001 N/A N/A % Reduction in Area: 98.60% N/A N/A % Reduction in Volume: 98.30% N/A N/A Classification: Full Thickness Without Exposed N/A N/A Support Structures Exudate Amount: Medium N/A N/A Exudate Type: Serous N/A N/A Exudate Color: amber N/A N/A Wound Margin: Flat and Intact N/A N/A Granulation Amount: Large (67-100%) N/A N/A Granulation Quality: Pink N/A N/A Necrotic Amount: Small (1-33%) N/A N/A Exposed Structures: Fat Layer (Subcutaneous Tissue) N/A N/A Exposed: Yes Fascia: No Tendon: No Muscle: No Joint: No Bone: No Epithelialization: None N/A N/A Treatment Notes Electronic Signature(s) Signed: 04/22/2020 2:52:59 PM By: [name] Entered By: [name] on 04/22/2020 13:53:18 Shoults, Gwenyth [name] ([id number]) Aubrey, Gwenyth [name] ([id number]) -------------------------------------------------------------------------------- Multi-Disciplinary Care Plan Details Patient Name: [name], [name] Date of Service: 04/22/2020 1:30 PM Medical Record Number: [id number] Patient Account Number: [id number] Date of Birth/Sex: Mar 17, 1932 (86 y.o. M) Treating RN: [name] Primary Care Jasimine Simms: Lavera Guise Other Clinician: Referring Marriana Hibberd: Lavera Guise Treating Bevelyn Arriola/Extender: Worthy Keeler Weeks in Treatment: 7 Active Inactive Electronic  Signature(s) Signed: 04/23/2020 1:06:09 PM By: Gretta Cool, BSN, RN, CWS, Kim RN, BSN Signed: 04/23/2020 3:19:07 PM By: [name] Previous Signature: 04/22/2020 2:52:59 PM Version By: [name] Entered By: Gretta Cool BSN, RN, CWS, Kim on 04/23/2020 Pelican, Gwenyth [name] ([id number]) -------------------------------------------------------------------------------- Pain Assessment Details Patient Name: SOVEREIGN, RAMIRO B. Date of Service: 04/22/2020 1:30 PM Medical Record Number: [id number] Patient Account Number: [id number] Date of Birth/Sex: [birth year] (86 y.o. M) Treating RN: Cornell Barman Primary Care Pegi Milazzo: Lavera Guise Other Clinician: Referring Aceson Labell: Lavera Guise Treating Oris Staffieri/Extender: Melburn Hake, HOYT Weeks in Treatment: 7 Active Problems Location of Pain Severity and Description of Pain Patient Has Paino No Site Locations Pain Management and Medication Current Pain Management: Electronic Signature(s) Signed: 04/23/2020 12:58:54 PM By: Gretta Cool, BSN, RN, CWS, Kim RN, BSN Entered By: Gretta Cool, BSN, RN, CWS, Kim on 04/22/2020 13:42:28 Knapper, Gwenyth [name] ([id number]) -------------------------------------------------------------------------------- Patient/Caregiver Education Details Patient Name: [name] Mole B. Date of Service: 04/22/2020 1:30 PM Medical Record Number: [id number] Patient Account Number: [id number] Date of Birth/Gender: 1932/07/05 (86 y.o. M) Treating RN: [name] Primary Care Physician: Lavera Guise Other Clinician: Referring Physician: Lavera Guise Treating Physician/Extender: Sharalyn Ink in Treatment: 7 Education Assessment Education Provided To: Patient Education Topics Provided Wound/Skin Impairment: Handouts: Caring for Your Ulcer Methods: Demonstration, Explain/Verbal Responses: State content correctly Electronic Signature(s) Signed: 04/22/2020 2:52:59 PM By: [name] Entered By: [name] on 04/22/2020 13:55:44 Schlitt, Gwenyth [name]  ([id number]) -------------------------------------------------------------------------------- Wound Assessment Details Patient Name: [name] Mole B. Date of Service: 04/22/2020 1:30 PM Medical Record Number: [id number] Patient Account Number: [id number] Date of Birth/Sex: 01-Dec-1931 (86 y.o. M) Treating RN: [name] Primary Care Melek Pownall: Lavera Guise Other Clinician: Referring Ruairi Stutsman: Lavera Guise Treating Audrick Lamoureaux/Extender: Melburn Hake, HOYT Weeks in Treatment: 7 Wound Status Wound Number: 3 Primary Venous Leg Ulcer Etiology: Wound Location: Left, Lateral Lower Leg Wound Status: Healed - Epithelialized Wounding Event: Gradually Appeared Comorbid Hypertension, End Stage Renal Disease, Osteoarthritis, Date Acquired: 02/19/2020 History: Seizure Disorder Weeks Of Treatment: 7 Clustered Wound: No Photos Wound Measurements Length: (cm) Width: (cm) Depth: (cm) Area: (cm) Volume: (cm) 0 % Reduction in Area: 100% 0 % Reduction in Volume: 100% 0 Epithelialization: None 0 Tunneling: No 0 Undermining: No Wound Description Classification: Full Thickness Without Exposed Support Structures Wound Margin: Flat and Intact Exudate Amount: Medium Exudate Type: Serous Exudate  Color: amber Foul Odor After Cleansing: No Slough/Fibrino Yes Wound Bed Granulation Amount: Large (67-100%) Exposed Structure Granulation Quality: Pink Fascia Exposed: No Necrotic Amount: Small (1-33%) Fat Layer (Subcutaneous Tissue) Exposed: Yes Necrotic Quality: Adherent Slough Tendon Exposed: No Muscle Exposed: No Joint Exposed: No Bone Exposed: No Electronic Signature(s) Signed: 04/22/2020 2:52:59 PM By: [name] Entered By: [name] on 04/22/2020 13:55:08 Bologna, Gwenyth [name] ([id number]) -------------------------------------------------------------------------------- Vitals Details Patient Name: [name] Mole B. Date of Service: 04/22/2020 1:30 PM Medical Record Number: [id number] Patient Account  Number: [id number] Date of Birth/Sex: 1932/07/20 (86 y.o. M) Treating RN: [name] Primary Care Svea Pusch: Lavera Guise Other Clinician: Referring Yeila Morro: Lavera Guise Treating Detrick Dani/Extender: Melburn Hake, HOYT Weeks in Treatment: 7 Vital Signs Time Taken: 13:35 Temperature (F): 98.1 Height (in): 69 Pulse (bpm): 62 Weight (lbs): 173 Respiratory Rate (breaths/min): 18 Body Mass Index (BMI): 25.5 Blood Pressure (mmHg): 132/75 Reference Range: 80 - 120 mg / dl Electronic Signature(s) Signed: 04/22/2020 3:09:28 PM By: Lorine Bears RCP, RRT, CHT Entered By: Lorine Bears on 04/22/2020 13:38:59

## 2020-04-30 DIAGNOSIS — R2990 Unspecified symptoms and signs involving the nervous system: Secondary | ICD-10-CM | POA: Diagnosis not present

## 2020-05-18 DIAGNOSIS — E039 Hypothyroidism, unspecified: Secondary | ICD-10-CM | POA: Diagnosis not present

## 2020-05-18 DIAGNOSIS — B356 Tinea cruris: Secondary | ICD-10-CM | POA: Diagnosis not present

## 2020-05-18 DIAGNOSIS — I10 Essential (primary) hypertension: Secondary | ICD-10-CM | POA: Diagnosis not present

## 2020-05-18 DIAGNOSIS — I872 Venous insufficiency (chronic) (peripheral): Secondary | ICD-10-CM | POA: Diagnosis not present

## 2020-06-18 ENCOUNTER — Emergency Department: Payer: PPO

## 2020-06-18 ENCOUNTER — Other Ambulatory Visit: Payer: Self-pay

## 2020-06-18 ENCOUNTER — Encounter: Payer: Self-pay | Admitting: Emergency Medicine

## 2020-06-18 ENCOUNTER — Inpatient Hospital Stay
Admission: EM | Admit: 2020-06-18 | Discharge: 2020-06-20 | DRG: 391 | Disposition: A | Discharge status: Home / Self Care | Payer: PPO | Attending: Hospitalist | Admitting: Hospitalist

## 2020-06-18 DIAGNOSIS — I491 Atrial premature depolarization: Secondary | ICD-10-CM | POA: Diagnosis not present

## 2020-06-18 DIAGNOSIS — Z8673 Personal history of transient ischemic attack (TIA), and cerebral infarction without residual deficits: Secondary | ICD-10-CM

## 2020-06-18 DIAGNOSIS — Z79890 Hormone replacement therapy (postmenopausal): Secondary | ICD-10-CM

## 2020-06-18 DIAGNOSIS — Z8249 Family history of ischemic heart disease and other diseases of the circulatory system: Secondary | ICD-10-CM

## 2020-06-18 DIAGNOSIS — G319 Degenerative disease of nervous system, unspecified: Secondary | ICD-10-CM | POA: Diagnosis not present

## 2020-06-18 DIAGNOSIS — J690 Pneumonitis due to inhalation of food and vomit: Secondary | ICD-10-CM | POA: Diagnosis not present

## 2020-06-18 DIAGNOSIS — J309 Allergic rhinitis, unspecified: Secondary | ICD-10-CM | POA: Diagnosis not present

## 2020-06-18 DIAGNOSIS — K5730 Diverticulosis of large intestine without perforation or abscess without bleeding: Secondary | ICD-10-CM | POA: Diagnosis not present

## 2020-06-18 DIAGNOSIS — I517 Cardiomegaly: Secondary | ICD-10-CM | POA: Diagnosis present

## 2020-06-18 DIAGNOSIS — Z7982 Long term (current) use of aspirin: Secondary | ICD-10-CM | POA: Diagnosis not present

## 2020-06-18 DIAGNOSIS — F329 Major depressive disorder, single episode, unspecified: Secondary | ICD-10-CM | POA: Diagnosis present

## 2020-06-18 DIAGNOSIS — R0902 Hypoxemia: Secondary | ICD-10-CM | POA: Diagnosis not present

## 2020-06-18 DIAGNOSIS — D509 Iron deficiency anemia, unspecified: Secondary | ICD-10-CM | POA: Diagnosis not present

## 2020-06-18 DIAGNOSIS — Z862 Personal history of diseases of the blood and blood-forming organs and certain disorders involving the immune mechanism: Secondary | ICD-10-CM | POA: Diagnosis not present

## 2020-06-18 DIAGNOSIS — Z9049 Acquired absence of other specified parts of digestive tract: Secondary | ICD-10-CM | POA: Diagnosis not present

## 2020-06-18 DIAGNOSIS — K529 Noninfective gastroenteritis and colitis, unspecified: Principal | ICD-10-CM | POA: Diagnosis present

## 2020-06-18 DIAGNOSIS — J9811 Atelectasis: Secondary | ICD-10-CM | POA: Diagnosis present

## 2020-06-18 DIAGNOSIS — D519 Vitamin B12 deficiency anemia, unspecified: Secondary | ICD-10-CM | POA: Diagnosis present

## 2020-06-18 DIAGNOSIS — K56609 Unspecified intestinal obstruction, unspecified as to partial versus complete obstruction: Secondary | ICD-10-CM | POA: Diagnosis present

## 2020-06-18 DIAGNOSIS — R112 Nausea with vomiting, unspecified: Secondary | ICD-10-CM | POA: Diagnosis not present

## 2020-06-18 DIAGNOSIS — Z79899 Other long term (current) drug therapy: Secondary | ICD-10-CM

## 2020-06-18 DIAGNOSIS — Z888 Allergy status to other drugs, medicaments and biological substances status: Secondary | ICD-10-CM | POA: Diagnosis not present

## 2020-06-18 DIAGNOSIS — G40909 Epilepsy, unspecified, not intractable, without status epilepticus: Secondary | ICD-10-CM | POA: Diagnosis present

## 2020-06-18 DIAGNOSIS — I129 Hypertensive chronic kidney disease with stage 1 through stage 4 chronic kidney disease, or unspecified chronic kidney disease: Secondary | ICD-10-CM | POA: Diagnosis present

## 2020-06-18 DIAGNOSIS — A419 Sepsis, unspecified organism: Secondary | ICD-10-CM | POA: Diagnosis not present

## 2020-06-18 DIAGNOSIS — K219 Gastro-esophageal reflux disease without esophagitis: Secondary | ICD-10-CM | POA: Diagnosis not present

## 2020-06-18 DIAGNOSIS — N189 Chronic kidney disease, unspecified: Secondary | ICD-10-CM | POA: Diagnosis present

## 2020-06-18 DIAGNOSIS — I10 Essential (primary) hypertension: Secondary | ICD-10-CM | POA: Diagnosis not present

## 2020-06-18 DIAGNOSIS — E785 Hyperlipidemia, unspecified: Secondary | ICD-10-CM | POA: Diagnosis present

## 2020-06-18 DIAGNOSIS — R404 Transient alteration of awareness: Secondary | ICD-10-CM | POA: Diagnosis not present

## 2020-06-18 DIAGNOSIS — Z20822 Contact with and (suspected) exposure to covid-19: Secondary | ICD-10-CM | POA: Diagnosis present

## 2020-06-18 DIAGNOSIS — D693 Immune thrombocytopenic purpura: Secondary | ICD-10-CM | POA: Diagnosis present

## 2020-06-18 DIAGNOSIS — M2548 Effusion, other site: Secondary | ICD-10-CM | POA: Diagnosis not present

## 2020-06-18 DIAGNOSIS — R4182 Altered mental status, unspecified: Secondary | ICD-10-CM | POA: Diagnosis present

## 2020-06-18 LAB — COMPREHENSIVE METABOLIC PANEL
ALT: 11 U/L (ref 0–44)
AST: 20 U/L (ref 15–41)
Albumin: 3.7 g/dL (ref 3.5–5.0)
Alkaline Phosphatase: 59 U/L (ref 38–126)
Anion gap: 12 (ref 5–15)
BUN: 19 mg/dL (ref 8–23)
CO2: 24 mmol/L (ref 22–32)
Calcium: 8.7 mg/dL — ABNORMAL LOW (ref 8.9–10.3)
Chloride: 101 mmol/L (ref 98–111)
Creatinine, Ser: 1.13 mg/dL (ref 0.61–1.24)
GFR calc Af Amer: >60 mL/min (ref >60)
GFR calc non Af Amer: 59 mL/min — ABNORMAL LOW (ref >60)
Glucose, Bld: 185 mg/dL — ABNORMAL HIGH (ref 70–99)
Potassium: 4.1 mmol/L (ref 3.5–5.1)
Sodium: 137 mmol/L (ref 135–145)
Total Bilirubin: 0.8 mg/dL (ref 0.3–1.2)
Total Protein: 6.4 g/dL — ABNORMAL LOW (ref 6.5–8.1)

## 2020-06-18 LAB — TROPONIN I (HIGH SENSITIVITY)
Troponin I (High Sensitivity): 11 ng/L (ref <18)
Troponin I (High Sensitivity): 22 ng/L — ABNORMAL HIGH (ref <18)

## 2020-06-18 LAB — CBC WITH DIFFERENTIAL/PLATELET
Abs Immature Granulocytes: 0.05 10*3/uL (ref 0.00–0.07)
Basophils Absolute: 0 10*3/uL (ref 0.0–0.1)
Basophils Relative: 0 %
Eosinophils Absolute: 0.2 10*3/uL (ref 0.0–0.5)
Eosinophils Relative: 2 %
HCT: 33.5 % — ABNORMAL LOW (ref 39.0–52.0)
Hemoglobin: 11.4 g/dL — ABNORMAL LOW (ref 13.0–17.0)
Immature Granulocytes: 1 %
Lymphocytes Relative: 14 %
Lymphs Abs: 1.4 10*3/uL (ref 0.7–4.0)
MCH: 29.8 pg (ref 26.0–34.0)
MCHC: 34 g/dL (ref 30.0–36.0)
MCV: 87.5 fL (ref 80.0–100.0)
Monocytes Absolute: 0.9 10*3/uL (ref 0.1–1.0)
Monocytes Relative: 9 %
Neutro Abs: 7.5 10*3/uL (ref 1.7–7.7)
Neutrophils Relative %: 74 %
Platelets: 171 10*3/uL (ref 150–400)
RBC: 3.83 MIL/uL — ABNORMAL LOW (ref 4.22–5.81)
RDW: 14.5 % (ref 11.5–15.5)
WBC: 10 10*3/uL (ref 4.0–10.5)
nRBC: 0 % (ref 0.0–0.2)

## 2020-06-18 LAB — SARS CORONAVIRUS 2 BY RT PCR (HOSPITAL ORDER, PERFORMED IN ~~LOC~~ HOSPITAL LAB): SARS Coronavirus 2: NEGATIVE

## 2020-06-18 LAB — PROTIME-INR
INR: 1 (ref 0.8–1.2)
Prothrombin Time: 13.1 seconds (ref 11.4–15.2)

## 2020-06-18 LAB — URINALYSIS, COMPLETE (UACMP) WITH MICROSCOPIC
Bacteria, UA: NONE SEEN
Bilirubin Urine: NEGATIVE
Glucose, UA: 50 mg/dL — AB
Hgb urine dipstick: NEGATIVE
Ketones, ur: NEGATIVE mg/dL
Leukocytes,Ua: NEGATIVE
Nitrite: NEGATIVE
Protein, ur: NEGATIVE mg/dL
Specific Gravity, Urine: 1.009 (ref 1.005–1.030)
Squamous Epithelial / HPF: NONE SEEN (ref 0–5)
pH: 8 (ref 5.0–8.0)

## 2020-06-18 LAB — LACTIC ACID, PLASMA
Lactic Acid, Venous: 3.4 mmol/L (ref 0.5–1.9)
Lactic Acid, Venous: 1.5 mmol/L (ref 0.5–1.9)

## 2020-06-18 LAB — PROCALCITONIN: Procalcitonin: <0.1 ng/mL

## 2020-06-18 LAB — APTT: aPTT: 31 seconds (ref 24–36)

## 2020-06-18 MED ORDER — SODIUM CHLORIDE 0.9 % IV SOLN
3.0000 g | Freq: Four times a day (QID) | INTRAVENOUS | Status: DC
  Administered 2020-06-18 – 2020-06-20 (×8): 3 g via INTRAVENOUS
  Filled 2020-06-18 (×5): qty 8
  Filled 2020-06-18: qty 3
  Filled 2020-06-18: qty 1
  Filled 2020-06-18 (×4): qty 8
  Filled 2020-06-18: qty 1
  Filled 2020-06-18 (×2): qty 8

## 2020-06-18 MED ORDER — SODIUM CHLORIDE 0.9 % IV SOLN
2.0000 g | Freq: Once | INTRAVENOUS | Status: AC
  Administered 2020-06-18: 2 g via INTRAVENOUS
  Filled 2020-06-18: qty 2

## 2020-06-18 MED ORDER — LACTATED RINGERS IV SOLN: INTRAVENOUS | Status: AC

## 2020-06-18 MED ORDER — PANTOPRAZOLE SODIUM 40 MG IV SOLR
40.0000 mg | INTRAVENOUS | Status: DC
  Administered 2020-06-18 – 2020-06-19 (×2): 40 mg via INTRAVENOUS
  Filled 2020-06-18 (×2): qty 40

## 2020-06-18 MED ORDER — HYDRALAZINE HCL 20 MG/ML IJ SOLN: 10.0000 mg | Freq: Four times a day (QID) | INTRAMUSCULAR | Status: DC | PRN

## 2020-06-18 MED ORDER — SODIUM CHLORIDE 0.9 % IV BOLUS
1000.0000 mL | Freq: Once | INTRAVENOUS | Status: AC
  Administered 2020-06-18: 1000 mL via INTRAVENOUS

## 2020-06-18 MED ORDER — ONDANSETRON HCL 4 MG/2ML IJ SOLN
INTRAMUSCULAR | Status: AC
  Administered 2020-06-18: 4 mg via INTRAVENOUS
  Filled 2020-06-18: qty 2

## 2020-06-18 MED ORDER — MORPHINE SULFATE (PF) 2 MG/ML IV SOLN: 2.0000 mg | INTRAVENOUS | Status: DC | PRN

## 2020-06-18 MED ORDER — SODIUM CHLORIDE 0.9 % IV SOLN: INTRAVENOUS | Status: DC

## 2020-06-18 MED ORDER — SODIUM CHLORIDE 0.9 % IV BOLUS (SEPSIS)
1000.0000 mL | Freq: Once | INTRAVENOUS | Status: AC
  Administered 2020-06-18: 1000 mL via INTRAVENOUS

## 2020-06-18 MED ORDER — ONDANSETRON HCL 4 MG/2ML IJ SOLN: 4.0000 mg | Freq: Once | INTRAMUSCULAR | Status: AC

## 2020-06-18 MED ORDER — METRONIDAZOLE IN NACL 5-0.79 MG/ML-% IV SOLN
500.0000 mg | Freq: Once | INTRAVENOUS | Status: AC
  Administered 2020-06-18: 500 mg via INTRAVENOUS
  Filled 2020-06-18: qty 100

## 2020-06-18 MED ORDER — SODIUM CHLORIDE 0.9 % IV BOLUS (SEPSIS)
500.0000 mL | Freq: Once | INTRAVENOUS | Status: AC
  Administered 2020-06-18: 500 mL via INTRAVENOUS

## 2020-06-18 MED ORDER — ONDANSETRON HCL 4 MG/2ML IJ SOLN: 4.0000 mg | Freq: Once | INTRAMUSCULAR | Status: DC

## 2020-06-18 MED ORDER — ONDANSETRON HCL 4 MG PO TABS: 4.0000 mg | ORAL_TABLET | Freq: Four times a day (QID) | ORAL | Status: DC | PRN

## 2020-06-18 MED ORDER — ENOXAPARIN SODIUM 40 MG/0.4ML ~~LOC~~ SOLN
40.0000 mg | SUBCUTANEOUS | Status: DC
  Administered 2020-06-18 – 2020-06-19 (×2): 40 mg via SUBCUTANEOUS
  Filled 2020-06-18 (×2): qty 0.4

## 2020-06-18 MED ORDER — ONDANSETRON HCL 4 MG/2ML IJ SOLN: 4.0000 mg | Freq: Four times a day (QID) | INTRAMUSCULAR | Status: DC | PRN

## 2020-06-18 NOTE — ED Notes (Signed)
Wife at bedside.

## 2020-06-18 NOTE — ED Provider Notes (Signed)
[facility] Emergency Department Provider Note   ____________________________________________   First MD Initiated Contact with Patient 06/18/20 0403     (approximate)  I have reviewed the triage vital signs and the nursing notes.   HISTORY  Chief Complaint Altered mental status  Level V caveat: Limited by altered mentation  HPI [name] is a 86 y.o. male brought to the ED via EMS from home with a chief complaint of altered mentation, nausea/vomiting and left lower leg wound.  Patient has a history of CVA, seizure, hypertension, CKD, ITP who was last seen in his usual state of health around midnight.  He was found by family to be nonverbal and seemingly lethargic.  Family has been treating a left lower leg wound.  EMS reports patient vomited during transport.  Became more alert during transport.     Past Medical History:  Diagnosis Date  . Allergic rhinitis   . Arthropathy   . B12 deficiency anemia   . Chronic neck pain   . CVA (cerebral infarction)   . GERD (gastroesophageal reflux disease)   . History of seizures   . HTN (hypertension)   . IDA (iron deficiency anemia)   . ITP (idiopathic thrombocytopenic purpura) 07/16/14    Patient Active Problem List   Diagnosis Date Noted  . Bradycardia 12/01/2019  . GERD (gastroesophageal reflux disease) 12/01/2019  . Hyperlipidemia 12/01/2019  . Seizure ([place]) 11/30/2019  . Acute metabolic encephalopathy 96/29/5284  . Stroke ([place]) 11/30/2019  . CKD (chronic kidney disease), stage IIIa 11/30/2019  . BPH (benign prostatic hyperplasia) 11/30/2019  . Leukocytosis 11/30/2019  . Idiopathic thrombocytopenic purpura ([place]) 10/06/2015  . Erosive esophagitis 09/14/2014  . IDA (iron deficiency anemia) 08/10/2014    Past Surgical History:  Procedure Laterality Date  . CHOLECYSTECTOMY  10/13/1988  . COLONOSCOPY  10/2014, 08/2014   Dr. Gwenlyn Perking  . UPPER GI ENDOSCOPY      Prior to Admission medications     Medication Sig Start Date End Date Taking? Authorizing Provider  aspirin EC 81 MG tablet Take 81 mg by mouth 2 (two) times daily.    Yes [provider]  Ibuprofen (ADVIL LIQUI-GELS MINIS) 200 MG CAPS Take 400 mg by mouth at bedtime.   Yes [provider]  levothyroxine (SYNTHROID) 50 MCG tablet Take 50 mcg by mouth daily. 05/21/20  Yes [provider]  nystatin cream (MYCOSTATIN) Apply topically 2 (two) times daily. 05/13/20  Yes [provider]  Probiotic Product ([place]) Take by mouth daily.   Yes [provider]  RABEprazole (ACIPHEX) 20 MG tablet Take 1 tablet by mouth daily. 1 hour before meals 06/02/15  Yes [provider]  acetaminophen (TYLENOL) 500 MG tablet Take 500 mg by mouth every 4 (four) hours as needed.  Patient not taking: Reported on 06/18/2020 12/19/10   [provider]    Allergies Meperidine  Family History  Problem Relation Age of Onset  . Heart disease Other   . Cirrhosis Mother     Social History Social History   Tobacco Use  . Smoking status: Never Smoker  . Smokeless tobacco: Never Used  Vaping Use  . Vaping Use: Never used  Substance Use Topics  . Alcohol use: No    Alcohol/week: 0.0 standard drinks  . Drug use: No    Review of Systems  Constitutional: No fever/chills Eyes: No visual changes. ENT: No sore throat. Cardiovascular: Denies chest pain. Respiratory: Denies shortness of breath. Gastrointestinal:  No abdominal pain.  Positive for vomiting.  No diarrhea.  No constipation. Genitourinary: Negative for dysuria. Musculoskeletal: Positive for left lower leg wound.  Negative for back pain. Skin: Negative for rash. Neurological: Positive for altered mentation.  Negative for headaches, focal weakness or numbness.   ____________________________________________   PHYSICAL EXAM:  VITAL SIGNS: ED Triage Vitals  Enc Vitals Group     BP      Pulse      Resp       Temp      Temp src      SpO2      Weight      Height      Head Circumference      Peak Flow      Pain Score      Pain Loc      Pain Edu?      Excl. in [place]?     Constitutional: Alert and confused.  Elderly appearing and in mild acute distress. Eyes: Conjunctivae are normal. PERRL. EOMI. Head: Atraumatic. Nose: No congestion/rhinnorhea. Mouth/Throat: Mucous membranes are mildly dry.   Neck: No stridor.   Cardiovascular: Normal rate, regular rhythm. Grossly normal heart sounds.  Good peripheral circulation. Respiratory: Normal respiratory effort.  No retractions. Lungs CTAB. Gastrointestinal: Soft and nontender to light or deep palpation.  Emesis on shirt.  No distention. No abdominal bruits. No CVA tenderness. Musculoskeletal: No lower extremity tenderness nor edema.  No joint effusions.  Removed dressing from left lower leg.  Wound seems healed without warmth or erythema. Neurologic: Eyes open spontaneously.   Skin:  Skin is hot, dry and intact. No rash noted.  No petechiae. Psychiatric: Unable to assess.  ____________________________________________   LABS (all labs ordered are listed, but only abnormal results are displayed)  Labs Reviewed  LACTIC ACID, PLASMA - Abnormal; Notable for the following components:      Result Value   Lactic Acid, Venous 3.4 (*)    All other components within normal limits  CBC WITH DIFFERENTIAL/PLATELET - Abnormal; Notable for the following components:   RBC 3.83 (*)    Hemoglobin 11.4 (*)    HCT 33.5 (*)    All other components within normal limits  URINALYSIS, COMPLETE (UACMP) WITH MICROSCOPIC - Abnormal; Notable for the following components:   Color, Urine STRAW (*)    APPearance CLEAR (*)    Glucose, UA 50 (*)    All other components within normal limits  SARS CORONAVIRUS 2 BY RT PCR (HOSPITAL ORDER, [facility])  URINE CULTURE  CULTURE, BLOOD (ROUTINE X 2)  CULTURE, BLOOD (ROUTINE X 2)  LACTIC ACID, PLASMA    PROTIME-INR  APTT  COMPREHENSIVE METABOLIC PANEL  PROCALCITONIN  TROPONIN I (HIGH SENSITIVITY)  TROPONIN I (HIGH SENSITIVITY)   ____________________________________________  EKG  ED ECG REPORT I, Isley Weisheit J, the attending physician, personally viewed and interpreted this ECG.   Date: 06/18/2020  EKG Time: 0408  Rate: 84  Rhythm: normal EKG, normal sinus rhythm  Axis: Normal  Intervals:none  ST&T Change: Nonspecific  ____________________________________________  RADIOLOGY  ED MD interpretation: No acute cardiopulmonary process; no ICH, small bowel obstruction  Official radiology report(s): CT Abdomen Pelvis Wo Contrast  Result Date: 06/18/2020 CLINICAL DATA:  Nausea and vomiting EXAM: CT ABDOMEN AND PELVIS WITHOUT CONTRAST TECHNIQUE: Multidetector CT imaging of the abdomen and pelvis was performed following the standard protocol without IV contrast. COMPARISON:  None. FINDINGS: Lower chest: Atelectasis or infiltration in the lung bases. Possibly aspiration  in the setting of vomiting. Cardiac enlargement with coronary artery calcifications. Hepatobiliary: Unenhanced appearance of the liver is normal. Gallbladder is not identified, possibly surgically absent. No bile duct dilatation. Pancreas: Unremarkable. No pancreatic ductal dilatation or surrounding inflammatory changes. Spleen: Normal in size without focal abnormality. Adrenals/Urinary Tract: Adrenal glands are unremarkable. Kidneys are normal, without renal calculi, focal lesion, or hydronephrosis. Bladder is unremarkable. Stomach/Bowel: Stomach is moderately distended. No gastric wall thickening. Dilated fluid-filled small bowel in the mid abdomen with bowel wall thickening and small bowel feces sign. The terminal ileum is decompressed. Transition zone appears to be in the right lower quadrant. No definite pneumatosis. Colon is not abnormally distended but is diffusely stool-filled. Severe diverticulosis of the sigmoid colon. No  obvious diverticulitis. Vascular/Lymphatic: Aortic atherosclerosis. No enlarged abdominal or pelvic lymph nodes. Reproductive: Prostate is unremarkable. Other: Small amount of free fluid in the pelvis, possibly reactive. No free air. Musculoskeletal: Degenerative changes in the spine. No destructive bone lesions. IMPRESSION: 1. Small bowel obstruction with transition zone in the right lower quadrant. Small bowel wall thickening may indicate associated enteritis. 2. Atelectasis or infiltration in the lung bases. Possibly aspiration in the setting of vomiting. 3. Small amount of free fluid in the pelvis, possibly reactive. 4. Severe sigmoid colon diverticulosis without evidence of diverticulitis. 5. Aortic atherosclerosis. Aortic Atherosclerosis (ICD10-I70.0). Electronically Signed   By: Lucienne Capers M.D.   On: 06/18/2020 05:27   CT Head Wo Contrast  Result Date: 06/18/2020 CLINICAL DATA:  Lethargic and altered mental status. Nausea and vomiting. EXAM: CT HEAD WITHOUT CONTRAST TECHNIQUE: Contiguous axial images were obtained from the base of the skull through the vertex without intravenous contrast. COMPARISON:  11/30/2019 FINDINGS: Brain: Diffuse cerebral atrophy. Ventricular dilatation consistent with central atrophy. Low-attenuation changes in the deep white matter consistent with small vessel ischemia. No abnormal extra-axial fluid collections. No mass effect or midline shift. Gray-white matter junctions are distinct. Basal cisterns are not effaced. No acute intracranial hemorrhage. Vascular: Vascular calcifications are present. Skull: Calvarium appears intact. Sinuses/Orbits: Paranasal sinuses are clear. Opacification of the right mastoid air cells with loss of internal septation. Other: None. IMPRESSION: 1. No acute intracranial abnormalities. 2. Chronic atrophy and small vessel ischemia. 3. Right mastoid effusions. Electronically Signed   By: Lucienne Capers M.D.   On: 06/18/2020 05:22   DG Chest Port  1 View  Result Date: 06/18/2020 CLINICAL DATA:  Questionable sepsis EXAM: PORTABLE CHEST 1 VIEW COMPARISON:  None. FINDINGS: Shallow inspiration with elevation of the left hemidiaphragm. Atelectasis in the lung bases. Cardiac enlargement. No vascular congestion or consolidation. No pleural effusions. IMPRESSION: Shallow inspiration with atelectasis in the lung bases. Cardiac enlargement. Electronically Signed   By: Lucienne Capers M.D.   On: 06/18/2020 04:28    ____________________________________________   PROCEDURES  Procedure(s) performed (including Critical Care):  .1-3 Lead EKG Interpretation Performed by: Paulette Blanch, MD Authorized by: Paulette Blanch, MD     Interpretation: normal     ECG rate:  82   ECG rate assessment: normal     Rhythm: sinus rhythm     Ectopy: none     Conduction: normal   Comments:     Patient placed on cardiac monitor to evaluate for arrhythmias   CRITICAL CARE Performed by: Paulette Blanch   Total critical care time: 45 minutes  Critical care time was exclusive of separately billable procedures and treating other patients.  Critical care was necessary to treat or prevent imminent or life-threatening deterioration.  Critical care was time spent personally by me on the following activities: development of treatment plan with patient and/or surrogate as well as nursing, discussions with consultants, evaluation of patient's response to treatment, examination of patient, obtaining history from patient or surrogate, ordering and performing treatments and interventions, ordering and review of laboratory studies, ordering and review of radiographic studies, pulse oximetry and re-evaluation of patient's condition.   ____________________________________________   INITIAL IMPRESSION / ASSESSMENT AND PLAN / ED COURSE  As part of my medical decision making, I reviewed the following data within the [facility] notes reviewed and  incorporated, Labs reviewed, EKG interpreted, Old chart reviewed, Radiograph reviewed, Discussed with admitting physician and Notes from prior ED visits     [name] was evaluated in Emergency Department on 06/18/2020 for the symptoms described in the history of present illness. He was evaluated in the context of the global COVID-19 pandemic, which necessitated consideration that the patient might be at risk for infection with the SARS-CoV-2 virus that causes COVID-19. Institutional protocols and algorithms that pertain to the evaluation of patients at risk for COVID-19 are in a state of rapid change based on information released by regulatory bodies including the CDC and federal and state organizations. These policies and algorithms were followed during the patient's care in the ED.    86 year old male presenting with altered mental status. Differential diagnosis includes, but is not limited to, alcohol, illicit or prescription medications, or other toxic ingestion; intracranial pathology such as stroke or intracerebral hemorrhage; fever or infectious causes including sepsis; hypoxemia and/or hypercarbia; uremia; trauma; endocrine related disorders such as diabetes, hypoglycemia, and thyroid-related diseases; hypertensive encephalopathy; etc.  Will obtain sepsis work-up, CT head/abdomen/pelvis.  Anticipate hospitalization.   Clinical Course as of Jun 18 704  Fri Jun 18, 2020  0540 Updated patient and wife on CT imaging results. Discussed with Dr. Peyton Najjar from general surgery; he will follow in the hospital. Will discuss with hospitalist services for admission.   [JS]    Clinical Course User Index [JS] Paulette Blanch, MD     ____________________________________________   FINAL CLINICAL IMPRESSION(S) / ED DIAGNOSES  Final diagnoses:  Altered mental status, unspecified altered mental status type  Sepsis, due to unspecified organism, unspecified whether acute organ dysfunction present  [facility])  SBO (small bowel obstruction) [facility])     ED Discharge Orders    None       Note:  This document was prepared using Dragon voice recognition software and may include unintentional dictation errors.   Paulette Blanch, MD 06/18/20 [phone]

## 2020-06-18 NOTE — Consult Note (Signed)
SURGICAL CONSULTATION NOTE   HISTORY OF PRESENT ILLNESS (HPI):  86 y.o. male presented to [facility] ED for evaluation of altered mental status as per wife. Patient reports he was feeling nauseous last night after eating half of a sandwich.  Around midnight he felt that he wanted to vomit but he did not vomit.  Due to the persistent nausea he was taken to the ER by EMS.  EMS report vomit episode during their care.  He was lethargic when he was evaluated by EMS.  After the vomit he was more alert.  The patient denies any abdominal pain.  There is no pain radiation.  There is no alleviating or aggravating factor.  Patient denies any previous episode of bowel obstruction.  Patient report having cholecystectomy in the past.  At the ED he had labs that shows no leukocytosis.  There was some elevation of lactic acid that resolved with hydration.  CT scan of the head shows no intracranial pathology.  CT scan of the abdominal pelvis shows small bowel dilation with small bowel thickening.  I personally evaluated the images.  There is no free air or free fluid.  Surgery is consulted by Dr. Beather Arbour in this context for evaluation and management of suspected bowel obstruction.  PAST MEDICAL HISTORY (PMH):  Past Medical History:  Diagnosis Date  . Allergic rhinitis   . Arthropathy   . B12 deficiency anemia   . Chronic neck pain   . CVA (cerebral infarction)   . GERD (gastroesophageal reflux disease)   . History of seizures   . HTN (hypertension)   . IDA (iron deficiency anemia)   . ITP (idiopathic thrombocytopenic purpura) 07/16/14     PAST SURGICAL HISTORY ([place]):  Past Surgical History:  Procedure Laterality Date  . CHOLECYSTECTOMY  10/13/1988  . COLONOSCOPY  10/2014, 08/2014   Dr. Gwenlyn Perking  . UPPER GI ENDOSCOPY       MEDICATIONS:  Prior to Admission medications   Medication Sig Start Date End Date Taking? Authorizing Provider  aspirin EC 81 MG tablet Take 81 mg by mouth 2 (two) times daily.    Yes  [provider]  Ibuprofen (ADVIL LIQUI-GELS MINIS) 200 MG CAPS Take 400 mg by mouth at bedtime.   Yes [provider]  levothyroxine (SYNTHROID) 50 MCG tablet Take 50 mcg by mouth daily. 05/21/20  Yes [provider]  nystatin cream (MYCOSTATIN) Apply topically 2 (two) times daily. 05/13/20  Yes [provider]  Probiotic Product ([place]) Take by mouth daily.   Yes [provider]  RABEprazole (ACIPHEX) 20 MG tablet Take 1 tablet by mouth daily. 1 hour before meals 06/02/15  Yes [provider]  acetaminophen (TYLENOL) 500 MG tablet Take 500 mg by mouth every 4 (four) hours as needed.  Patient not taking: Reported on 06/18/2020 12/19/10   [provider]     ALLERGIES:  Allergies  Allergen Reactions  . Meperidine Hives     SOCIAL HISTORY:  Social History   Socioeconomic History  . Marital status: Married    Spouse name: Not on file  . Number of children: Not on file  . Years of education: Not on file  . Highest education level: Not on file  Occupational History  . Not on file  Tobacco Use  . Smoking status: Never Smoker  . Smokeless tobacco: Never Used  Vaping Use  . Vaping Use: Never used  Substance and Sexual Activity  . Alcohol use: No  Alcohol/week: 0.0 standard drinks  . Drug use: No  . Sexual activity: Never  Other Topics Concern  . Not on file  Social History Narrative  . Not on file   Social Determinants of Health   Financial Resource Strain:   . Difficulty of Paying Living Expenses:   Food Insecurity:   . Worried About Charity fundraiser in the Last Year:   . Arboriculturist in the Last Year:   Transportation Needs:   . Film/video editor (Medical):   Marland Kitchen Lack of Transportation (Non-Medical):   Physical Activity:   . Days of Exercise per Week:   . Minutes of Exercise per Session:   Stress:   . Feeling of Stress :   Social Connections:   . Frequency of Communication with  Friends and Family:   . Frequency of Social Gatherings with Friends and Family:   . Attends Religious Services:   . Active Member of Clubs or Organizations:   . Attends Archivist Meetings:   Marland Kitchen Marital Status:   Intimate Partner Violence:   . Fear of Current or Ex-Partner:   . Emotionally Abused:   Marland Kitchen Physically Abused:   . Sexually Abused:       FAMILY HISTORY:  Family History  Problem Relation Age of Onset  . Heart disease Other   . Cirrhosis Mother      REVIEW OF SYSTEMS:  Constitutional: denies weight loss, fever, chills, or sweats  Eyes: denies any other vision changes, history of eye injury  ENT: denies sore throat, hearing problems  Respiratory: denies shortness of breath, wheezing  Cardiovascular: denies chest pain, palpitations  Gastrointestinal: abdominal pain, nausea and vomiting Genitourinary: denies burning with urination or urinary frequency Musculoskeletal: denies any other joint pains or cramps  Skin: denies any other rashes or skin discolorations  Neurological: denies any other headache, dizziness, weakness  Psychiatric: denies any other depression, anxiety   All other review of systems were negative   VITAL SIGNS:  Temp:  [98.6 F (37 C)] 98.6 F (37 C) (08/06 0421) Pulse Rate:  [87] 87 (08/06 0421) Resp:  [16] 16 (08/06 0421) BP: (176)/(108) 176/108 (08/06 0421) SpO2:  [96 %] 96 % (08/06 0421)             INTAKE/OUTPUT:  This shift: Total I/O In: -  Out: 1200 [Urine:1200]  Last 2 shifts: @IOLAST2SHIFTS @   PHYSICAL EXAM:  Constitutional:  -- Normal body habitus  -- Awake, alert, and oriented x3  Eyes:  -- Pupils equally round and reactive to light  -- No scleral icterus  Ear, nose, and throat:  -- No jugular venous distension  Pulmonary:  -- No crackles  -- Equal breath sounds bilaterally -- Breathing non-labored at rest Cardiovascular:  -- S1, S2 present  -- No pericardial rubs Gastrointestinal:  -- Abdomen soft,  nontender, mild-distended, no guarding or rebound tenderness -- No abdominal masses appreciated, pulsatile or otherwise  Musculoskeletal and Integumentary:  -- Wounds: None appreciated -- Extremities: B/L UE and LE FROM, hands and feet warm, no edema  Neurologic:  -- Motor function: intact and symmetric -- Sensation: intact and symmetric   Labs:  CBC Latest Ref Rng & Units 06/18/2020 03/23/2020 12/01/2019  WBC 4.0 - 10.5 K/uL 10.0 8.4 10.4  Hemoglobin 13.0 - 17.0 g/dL 11.4(L) 11.3(L) 12.5(L)  Hematocrit 39 - 52 % 33.5(L) 34.3(L) 37.8(L)  Platelets 150 - 400 K/uL 171 167 151   CMP Latest Ref Rng & Units 06/18/2020  03/23/2020 12/01/2019  Glucose 70 - 99 mg/dL 185(H) 122(H) 79  BUN 8 - 23 mg/dL 19 21 19   Creatinine 0.61 - 1.24 mg/dL 1.13 1.32(H) 1.00  Sodium 135 - 145 mmol/L 137 140 139  Potassium 3.5 - 5.1 mmol/L 4.1 4.5 3.6  Chloride 98 - 111 mmol/L 101 105 104  CO2 22 - 32 mmol/L 24 27 26   Calcium 8.9 - 10.3 mg/dL 8.7(L) 8.3(L) 8.5(L)  Total Protein 6.5 - 8.1 g/dL 6.4(L) - -  Total Bilirubin 0.3 - 1.2 mg/dL 0.8 - -  Alkaline Phos 38 - 126 U/L 59 - -  AST 15 - 41 U/L 20 - -  ALT 0 - 44 U/L 11 - -     Imaging studies:  EXAM: CT ABDOMEN AND PELVIS WITHOUT CONTRAST  TECHNIQUE: Multidetector CT imaging of the abdomen and pelvis was performed following the standard protocol without IV contrast.  COMPARISON:  None.  FINDINGS: Lower chest: Atelectasis or infiltration in the lung bases. Possibly aspiration in the setting of vomiting. Cardiac enlargement with coronary artery calcifications.  Hepatobiliary: Unenhanced appearance of the liver is normal. Gallbladder is not identified, possibly surgically absent. No bile duct dilatation.  Pancreas: Unremarkable. No pancreatic ductal dilatation or surrounding inflammatory changes.  Spleen: Normal in size without focal abnormality.  Adrenals/Urinary Tract: Adrenal glands are unremarkable. Kidneys are normal, without renal  calculi, focal lesion, or hydronephrosis. Bladder is unremarkable.  Stomach/Bowel: Stomach is moderately distended. No gastric wall thickening. Dilated fluid-filled small bowel in the mid abdomen with bowel wall thickening and small bowel feces sign. The terminal ileum is decompressed. Transition zone appears to be in the right lower quadrant. No definite pneumatosis. Colon is not abnormally distended but is diffusely stool-filled. Severe diverticulosis of the sigmoid colon. No obvious diverticulitis.  Vascular/Lymphatic: Aortic atherosclerosis. No enlarged abdominal or pelvic lymph nodes.  Reproductive: Prostate is unremarkable.  Other: Small amount of free fluid in the pelvis, possibly reactive. No free air.  Musculoskeletal: Degenerative changes in the spine. No destructive bone lesions.  IMPRESSION: 1. Small bowel obstruction with transition zone in the right lower quadrant. Small bowel wall thickening may indicate associated enteritis. 2. Atelectasis or infiltration in the lung bases. Possibly aspiration in the setting of vomiting. 3. Small amount of free fluid in the pelvis, possibly reactive. 4. Severe sigmoid colon diverticulosis without evidence of diverticulitis. 5. Aortic atherosclerosis.  Aortic Atherosclerosis (ICD10-I70.0).   Electronically Signed   By: Lucienne Capers M.D.   On: 06/18/2020 05:27  Assessment/Plan:  86 y.o. male with enteritis causing depression, complicated by pertinent comorbidities including history of CVA, GERD, history of seizure, ITP.  Patient with CT scan finding of small bowel dilation and vomiting. This report SBO. Patient with clinical picture of gastroenteritis with inflammation of the small bowel on the CT scan. I agree with current medical management. Agree with bowel rest and IV hydration. I discussed with the patient that if he has recurrent vomiting he will need an nasogastric tube but right now the patient reported he  is passing gas, abdominal pain and no nausea. My recommendation is to put NGT if patient has recurrent nausea and vomiting, but if not the patient can continue conservative management. I will follow to assist in the management of this patient.  Arnold Long, MD

## 2020-06-18 NOTE — Progress Notes (Signed)
PHARMACY -  BRIEF ANTIBIOTIC NOTE   Pharmacy has received consult(s) for Cefepime from an ED provider.  The patient's profile has been reviewed for ht/wt/allergies/indication/available labs.    One time order(s) placed for Cefepime 2gm  Further antibiotics/pharmacy consults should be ordered by admitting physician if indicated.                       Thank you, Hart Robinsons A 06/18/2020  5:56 AM

## 2020-06-18 NOTE — ED Notes (Signed)
Pt's room air sat noted to be 84%, admitting dr aware and ordered 2L [place] of O2, sats up to pt denies feeling sob, no distress noted, sats up tp 95%.

## 2020-06-18 NOTE — Progress Notes (Signed)
Pharmacy Antibiotic Note  [name] is a 86 y.o. male admitted on 06/18/2020 with aspiration pneumonia.  Pharmacy has been consulted for Unasyn dosing.  Plan: Unasyn 3g IV q6h     Temp (24hrs), Avg:98.6 F (37 C), Min:98.6 F (37 C), Max:98.6 F (37 C)  Recent Labs  Lab 06/18/20 0409 06/18/20 0555  WBC 10.0  --   CREATININE 1.13  --   LATICACIDVEN 3.4* 1.5    CrCl cannot be calculated (Unknown ideal weight.).    Allergies  Allergen Reactions   Meperidine Hives    Antimicrobials this admission: Cefepime 8/6 x 1 Unasyn 8/6 >>   Dose adjustments this admission:  Microbiology results:  Thank you for allowing pharmacy to be a part of this patients care.  Paulina Fusi, PharmD, BCPS 06/18/2020 9:38 AM

## 2020-06-18 NOTE — Progress Notes (Signed)
Surgery follow up Note  Patient reevaluated now.  Patient very comfortable with a great attitude.  Patient reported that he has no abdominal pain.  Patient reported that he is passing gas.  Patient denies any nausea.  Wife at bedside.  She also reported he is passing gas.  Vitals:   06/18/20 0421  BP: (!) 176/108  Pulse: 87  Resp: 16  Temp: 98.6 F (37 C)  SpO2: 96%   General: Alert, oriented x3 Abdomen: Soft and depressible, nontender, nondistended  Assessment and plan:  Patient improving clinically.  He is passing gas.  He does not have abdominal pain.  He is not nauseous.  I will start him on clear liquids and assess for toleration.  Wife very concerned about patient frequent episode of lethargy.  I tried to correlate denies episode with the vomiting and abdominal pain but she said that this happened multiple times without the vomiting or the abdominal pain.  I discussed with her that the CT scan of the head was negative.  I will defer any further management recommendation by hospitalist.  This follow-up encounter was more than 30-minute most of the time counseling the patient and the wife and coordinating plan of care.  Herbert Pun, MD

## 2020-06-18 NOTE — H&P (Signed)
History and Physical    [name] KDX:[id number] DOB: 06/10/1932 DOA: 06/18/2020  PCP: Lynnell Jude, MD   Patient coming from: Home  I have personally briefly reviewed patient's old medical records in [place]  Chief Complaint: Altered mental status  HPI: [name] is a 86 y.o. male with medical history significant for CVA, seizure disorder, hypertension, history of ITP, GERD who was brought into the emergency room by EMS after his wife called for evaluation of change in mental status. Patient states that he had abdominal discomfort/pain mostly in the periumbilical area the night prior to his admission and had taken some Alka-Seltzer and Colace. He had nausea but was unable to vomit. He took the stool softeners with hopes that he would have a bowel movement because he was concerned that he may have another blockage. Patient went to bed and his wife states that she awoke around 3 AM to him making some strange noise. Patient was unresponsive to her and so she called her daughter-in-law who advised her to call EMS. When EMS arrived patient was lethargic and nonverbal and in route to the hospital had an episode of emesis. Patient became more awake and alert during transport. He has had a prior bowel obstruction and has had surgical intervention in the past.  He has also had cholecystectomy. His wife states that he has had problem with his bowels since after that surgery and usually has small bowel movements. He denies having any fever or chills, he has no cough, no dizziness, no lightheadedness, no urinary symptoms, no chest pain or shortness of breath. During my evaluation patient was noted to have pulse oximetry on room air at rest in the low 80s and was placed on oxygen supplementation at 2 L with improvement in his pulse oximetry. He does not wear home oxygen. Labs reveal a white count of 10, hemoglobin of 11, BUN of 19, creatinine of 1.13, initial lactate of 3.4 down to 1.5. CT scan  of abdomen pelvis shows a small bowel obstruction with transition zone in the right lower quadrant. Small bowel wall thickening may indicate associated enteritis. Atelectasis or infiltration in the lung bases possible aspiration in the setting of vomiting. Severe sigmoid colon diverticulosis without evidence of diverticulitis. Chest x-ray reviewed by me shows cardiomegaly. No pleural effusion. Twelve-lead EKG reviewed by me shows sinus rhythm   ED Course: Patient is an 86 year old Caucasian male who was brought into the emergency room by EMS for evaluation of altered mental status. Patient also complained of abdominal pain mostly periumbilical associated with nausea and emesis. Patient is noted to have a small bowel obstruction on imaging study and possible aspiration pneumonitis. He is hypoxic requiring oxygen supplementation. He will be admitted to the hospital for further evaluation.  Review of Systems: As per HPI otherwise 10 point review of systems negative.    Past Medical History:  Diagnosis Date  . Allergic rhinitis   . Arthropathy   . B12 deficiency anemia   . Chronic neck pain   . CVA (cerebral infarction)   . GERD (gastroesophageal reflux disease)   . History of seizures   . HTN (hypertension)   . IDA (iron deficiency anemia)   . ITP (idiopathic thrombocytopenic purpura) 07/16/14    Past Surgical History:  Procedure Laterality Date  . CHOLECYSTECTOMY  10/13/1988  . COLONOSCOPY  10/2014, 08/2014   Dr. Gwenlyn Perking  . UPPER GI ENDOSCOPY       reports that he has never  smoked. He has never used smokeless tobacco. He reports that he does not drink alcohol and does not use drugs.  Allergies  Allergen Reactions  . Meperidine Hives    Family History  Problem Relation Age of Onset  . Heart disease Other   . Cirrhosis Mother      Prior to Admission medications   Medication Sig Start Date End Date Taking? Authorizing Provider  aspirin EC 81 MG tablet Take 81 mg by mouth 2  (two) times daily.    Yes [provider]  Ibuprofen (ADVIL LIQUI-GELS MINIS) 200 MG CAPS Take 400 mg by mouth at bedtime.   Yes [provider]  levothyroxine (SYNTHROID) 50 MCG tablet Take 50 mcg by mouth daily. 05/21/20  Yes [provider]  nystatin cream (MYCOSTATIN) Apply topically 2 (two) times daily. 05/13/20  Yes [provider]  Probiotic Product ([place]) Take by mouth daily.   Yes [provider]  RABEprazole (ACIPHEX) 20 MG tablet Take 1 tablet by mouth daily. 1 hour before meals 06/02/15  Yes [provider]  acetaminophen (TYLENOL) 500 MG tablet Take 500 mg by mouth every 4 (four) hours as needed.  Patient not taking: Reported on 06/18/2020 12/19/10   [provider]    Physical Exam: Vitals:   06/18/20 0421  BP: (!) 176/108  Pulse: 87  Resp: 16  Temp: 98.6 F (37 C)  TempSrc: Rectal  SpO2: 96%     Vitals:   06/18/20 0421  BP: (!) 176/108  Pulse: 87  Resp: 16  Temp: 98.6 F (37 C)  TempSrc: Rectal  SpO2: 96%    Constitutional: NAD, alert and oriented x 3. Wife at the bedside Eyes: PERRL, lids and conjunctivae normal ENMT: Mucous membranes are dry Neck: normal, supple, no masses, no thyromegaly Respiratory: Scattered rhonchi, no wheezing, no crackles. Normal respiratory effort. No accessory muscle use.  Cardiovascular: Regular rate and rhythm, no murmurs / rubs / gallops. 1+ extremity edema. 2+ pedal pulses. No carotid bruits.  Abdomen: no tenderness, no masses palpated. No hepatosplenomegaly. Bowel sounds positive.  Musculoskeletal: no clubbing / cyanosis. No joint deformity upper and lower extremities.  Skin: no rashes, lesions, ulcers.  Neurologic: No gross focal neurologic deficit. Psychiatric: Normal mood and affect.   Labs on Admission: I have personally reviewed following labs and imaging studies  CBC: Recent Labs  Lab 06/18/20 0409  WBC 10.0  NEUTROABS 7.5  HGB 11.4*    HCT 33.5*  MCV 87.5  PLT 992   Basic Metabolic Panel: Recent Labs  Lab 06/18/20 0409  NA 137  K 4.1  CL 101  CO2 24  GLUCOSE 185*  BUN 19  CREATININE 1.13  CALCIUM 8.7*   GFR: CrCl cannot be calculated (Unknown ideal weight.). Liver Function Tests: Recent Labs  Lab 06/18/20 0409  AST 20  ALT 11  ALKPHOS 59  BILITOT 0.8  PROT 6.4*  ALBUMIN 3.7   No results for input(s): LIPASE, AMYLASE in the last 168 hours. No results for input(s): AMMONIA in the last 168 hours. Coagulation Profile: Recent Labs  Lab 06/18/20 0409  INR 1.0   Cardiac Enzymes: No results for input(s): CKTOTAL, CKMB, CKMBINDEX, TROPONINI in the last 168 hours. BNP (last 3 results) No results for input(s): PROBNP in the last 8760 hours. HbA1C: No results for input(s): HGBA1C in the last 72 hours. CBG: No results for input(s): GLUCAP in the last 168 hours. Lipid Profile: No results for input(s): CHOL, HDL, LDLCALC,  TRIG, CHOLHDL, LDLDIRECT in the last 72 hours. Thyroid Function Tests: No results for input(s): TSH, T4TOTAL, FREET4, T3FREE, THYROIDAB in the last 72 hours. Anemia Panel: No results for input(s): VITAMINB12, FOLATE, FERRITIN, TIBC, IRON, RETICCTPCT in the last 72 hours. Urine analysis:    Component Value Date/Time   COLORURINE STRAW (A) 06/18/2020 0555   APPEARANCEUR CLEAR (A) 06/18/2020 0555   LABSPEC 1.009 06/18/2020 0555   PHURINE 8.0 06/18/2020 0555   GLUCOSEU 50 (A) 06/18/2020 0555   HGBUR NEGATIVE 06/18/2020 0555   [place] 06/18/2020 0555   [place] 06/18/2020 0555   PROTEINUR NEGATIVE 06/18/2020 0555   NITRITE NEGATIVE 06/18/2020 0555   LEUKOCYTESUR NEGATIVE 06/18/2020 0555    Radiological Exams on Admission: CT Abdomen Pelvis Wo Contrast  Result Date: 06/18/2020 CLINICAL DATA:  Nausea and vomiting EXAM: CT ABDOMEN AND PELVIS WITHOUT CONTRAST TECHNIQUE: Multidetector CT imaging of the abdomen and pelvis was performed following the standard  protocol without IV contrast. COMPARISON:  None. FINDINGS: Lower chest: Atelectasis or infiltration in the lung bases. Possibly aspiration in the setting of vomiting. Cardiac enlargement with coronary artery calcifications. Hepatobiliary: Unenhanced appearance of the liver is normal. Gallbladder is not identified, possibly surgically absent. No bile duct dilatation. Pancreas: Unremarkable. No pancreatic ductal dilatation or surrounding inflammatory changes. Spleen: Normal in size without focal abnormality. Adrenals/Urinary Tract: Adrenal glands are unremarkable. Kidneys are normal, without renal calculi, focal lesion, or hydronephrosis. Bladder is unremarkable. Stomach/Bowel: Stomach is moderately distended. No gastric wall thickening. Dilated fluid-filled small bowel in the mid abdomen with bowel wall thickening and small bowel feces sign. The terminal ileum is decompressed. Transition zone appears to be in the right lower quadrant. No definite pneumatosis. Colon is not abnormally distended but is diffusely stool-filled. Severe diverticulosis of the sigmoid colon. No obvious diverticulitis. Vascular/Lymphatic: Aortic atherosclerosis. No enlarged abdominal or pelvic lymph nodes. Reproductive: Prostate is unremarkable. Other: Small amount of free fluid in the pelvis, possibly reactive. No free air. Musculoskeletal: Degenerative changes in the spine. No destructive bone lesions. IMPRESSION: 1. Small bowel obstruction with transition zone in the right lower quadrant. Small bowel wall thickening may indicate associated enteritis. 2. Atelectasis or infiltration in the lung bases. Possibly aspiration in the setting of vomiting. 3. Small amount of free fluid in the pelvis, possibly reactive. 4. Severe sigmoid colon diverticulosis without evidence of diverticulitis. 5. Aortic atherosclerosis. Aortic Atherosclerosis (ICD10-I70.0). Electronically Signed   By: Lucienne Capers M.D.   On: 06/18/2020 05:27   CT Head Wo  Contrast  Result Date: 06/18/2020 CLINICAL DATA:  Lethargic and altered mental status. Nausea and vomiting. EXAM: CT HEAD WITHOUT CONTRAST TECHNIQUE: Contiguous axial images were obtained from the base of the skull through the vertex without intravenous contrast. COMPARISON:  11/30/2019 FINDINGS: Brain: Diffuse cerebral atrophy. Ventricular dilatation consistent with central atrophy. Low-attenuation changes in the deep white matter consistent with small vessel ischemia. No abnormal extra-axial fluid collections. No mass effect or midline shift. Gray-white matter junctions are distinct. Basal cisterns are not effaced. No acute intracranial hemorrhage. Vascular: Vascular calcifications are present. Skull: Calvarium appears intact. Sinuses/Orbits: Paranasal sinuses are clear. Opacification of the right mastoid air cells with loss of internal septation. Other: None. IMPRESSION: 1. No acute intracranial abnormalities. 2. Chronic atrophy and small vessel ischemia. 3. Right mastoid effusions. Electronically Signed   By: Lucienne Capers M.D.   On: 06/18/2020 05:22   DG Chest Port 1 View  Result Date: 06/18/2020 CLINICAL DATA:  Questionable sepsis EXAM: PORTABLE CHEST 1 VIEW  COMPARISON:  None. FINDINGS: Shallow inspiration with elevation of the left hemidiaphragm. Atelectasis in the lung bases. Cardiac enlargement. No vascular congestion or consolidation. No pleural effusions. IMPRESSION: Shallow inspiration with atelectasis in the lung bases. Cardiac enlargement. Electronically Signed   By: Lucienne Capers M.D.   On: 06/18/2020 04:28    EKG: Independently reviewed.  Sinus rhythm  Assessment/Plan Principal Problem:   SBO (small bowel obstruction) (HCC) Active Problems:   Idiopathic thrombocytopenic purpura (HCC)   GERD (gastroesophageal reflux disease)   Aspiration pneumonia (HCC)   Altered mental status     Small bowel obstruction Patient presented for evaluation of periumbilical pain associated with  nausea and vomiting. He has had prior abdominal surgery his symptoms may be related to adhesions. He has not had any further episodes of emesis but remains nauseous We will keep patient n.p.o. Gastric decompression Consult surgery Judicious IV fluid hydration, antiemetics and pain medication as needed   Altered mental status ?? Postictal state Patient has a history of seizures. His wife states that she woke up in the middle of the night and heard  him groaning. He was lethargic and unarousable and so EMS was called. She denies seeing any jerking movements of his extremities during the episode Patient regained his mental status in route to the hospital and is back to baseline    Aspiration pneumonitis Patient with new hypoxia, room air pulse oximetry was in the low 80s during my evaluation imaging shows bilateral infiltrates suggestive of possible aspiration related to emesis We will place patient on IV Unasyn. Continue oxygen supplementation to maintain pulse oximetry greater than 92%   GERD Place patient on IV PPI    DVT prophylaxis: Lovenox Code Status: Full code Family Communication: Greater than 50% of time was spent discussing patient's condition and plan of care with him and his wife at the bedside.  All questions and concerns have been addressed.  They verbalized understanding and agree with the plan. Disposition Plan: Back to previous home environment Consults called: Surgery    Maycen Degregory MD [facility]     06/18/2020, 8:48 AM

## 2020-06-18 NOTE — ED Notes (Signed)
Pt eating clear liquid supper

## 2020-06-18 NOTE — ED Triage Notes (Signed)
Pt arrived via EMS from home where pt went to bed with nausea, woke this AM lethargic and altered. EMS reported projectile vomiting x1 in route to ED. Pt arrived drowsy, lethargic and alert to self only.

## 2020-06-19 DIAGNOSIS — R4182 Altered mental status, unspecified: Secondary | ICD-10-CM

## 2020-06-19 LAB — URINE CULTURE

## 2020-06-19 LAB — BASIC METABOLIC PANEL
Anion gap: 8 (ref 5–15)
BUN: 18 mg/dL (ref 8–23)
CO2: 26 mmol/L (ref 22–32)
Calcium: 7.7 mg/dL — ABNORMAL LOW (ref 8.9–10.3)
Chloride: 105 mmol/L (ref 98–111)
Creatinine, Ser: 1.18 mg/dL (ref 0.61–1.24)
GFR calc Af Amer: >60 mL/min (ref >60)
GFR calc non Af Amer: 56 mL/min — ABNORMAL LOW (ref >60)
Glucose, Bld: 90 mg/dL (ref 70–99)
Potassium: 3.8 mmol/L (ref 3.5–5.1)
Sodium: 139 mmol/L (ref 135–145)

## 2020-06-19 LAB — CBC
HCT: 28.8 % — ABNORMAL LOW (ref 39.0–52.0)
Hemoglobin: 9.4 g/dL — ABNORMAL LOW (ref 13.0–17.0)
MCH: 29.2 pg (ref 26.0–34.0)
MCHC: 32.6 g/dL (ref 30.0–36.0)
MCV: 89.4 fL (ref 80.0–100.0)
Platelets: 140 10*3/uL — ABNORMAL LOW (ref 150–400)
RBC: 3.22 MIL/uL — ABNORMAL LOW (ref 4.22–5.81)
RDW: 14.6 % (ref 11.5–15.5)
WBC: 11.1 10*3/uL — ABNORMAL HIGH (ref 4.0–10.5)
nRBC: 0 % (ref 0.0–0.2)

## 2020-06-19 MED ORDER — PANTOPRAZOLE SODIUM 40 MG PO TBEC
40.0000 mg | DELAYED_RELEASE_TABLET | Freq: Every day | ORAL | Status: DC
  Administered 2020-06-20: 40 mg via ORAL
  Filled 2020-06-19: qty 1

## 2020-06-19 MED ORDER — LEVETIRACETAM 500 MG PO TABS
500.0000 mg | ORAL_TABLET | Freq: Two times a day (BID) | ORAL | Status: DC
  Administered 2020-06-19 – 2020-06-20 (×2): 500 mg via ORAL
  Filled 2020-06-19 (×2): qty 1

## 2020-06-19 MED ORDER — LEVETIRACETAM 500 MG PO TABS
500.0000 mg | ORAL_TABLET | Freq: Once | ORAL | Status: AC
  Administered 2020-06-19: 500 mg via ORAL
  Filled 2020-06-19: qty 1

## 2020-06-19 NOTE — Progress Notes (Signed)
Physical Therapy Evaluation Patient Details Name: [name] MRN: [id number] DOB: 09-29-1932 Today's Date: 06/19/2020   History of Present Illness  Per MD note:[name] is a 86 y.o. male with medical history significant for CVA, seizure disorder, hypertension, history of ITP, GERD who was brought into the emergency room by EMS after his wife called for evaluation of change in mental status. Patient states that he had abdominal discomfort/pain mostly in the periumbilical area the night prior to his admission and had taken some Alka-Seltzer and Colace. He had nausea but was unable to vomit. He took the stool softeners with hopes that he would have a bowel movement because he was concerned that he may have another blockage. Patient went to bed and his wife states that she awoke around 3 AM to him making some strange noise. Patient was unresponsive to her and so she called her daughter-in-law who advised her to call EMS. When EMS arrived patient was lethargic and nonverbal and in route to the hospital had an episode of emesis. Patient became more awake and alert during transport.  Clinical Impression  Patient agrees to PT evaluation. He is independent with bed mobility, tranfers and gait in room 25 feet. He has good static and dynamic standing balance and 4/5 strength BLE. He was independent with all mobility PLOF and did his own yard work. He has 5 steps to ascend and descend with a railing. He lives with his wife and has no reports of pain. He will benefit from skilled PT to ambulate community distances and safety on steps.      Follow Up Recommendations No PT follow up    Equipment Recommendations       Recommendations for Other Services       Precautions / Restrictions Restrictions Weight Bearing Restrictions: No      Mobility  Bed Mobility Overal bed mobility: Independent                Transfers Overall transfer level: Independent               General transfer  comment: no AD  Ambulation/Gait Ambulation/Gait assistance: Independent Gait Distance (Feet): 25 Feet Assistive device: None Gait Pattern/deviations: Step-through pattern        Stairs            Wheelchair Mobility    Modified Rankin (Stroke Patients Only)       Balance Overall balance assessment: Independent                                           Pertinent Vitals/Pain Pain Assessment: No/denies pain    Home Living Family/patient expects to be discharged to:: Private residence Living Arrangements: Spouse/significant other     Home Access: Stairs to enter Entrance Stairs-Rails: Right Entrance Stairs-Number of Steps: 3          Prior Function Level of Independence: Independent               Hand Dominance        Extremity/Trunk Assessment                Communication   Communication: No difficulties  Cognition Arousal/Alertness: Awake/alert  General Comments      Exercises     Assessment/Plan    PT Assessment Patient needs continued PT services  PT Problem List Decreased activity tolerance;Decreased mobility       PT Treatment Interventions Gait training;Therapeutic exercise    PT Goals (Current goals can be found in the Care Plan section)  Acute Rehab PT Goals Patient Stated Goal: to go home PT Goal Formulation: With patient Time For Goal Achievement: 07/03/20 Potential to Achieve Goals: Good    Frequency Min 2X/week   Barriers to discharge        Co-evaluation               AM-PAC PT "6 Clicks" Mobility  Outcome Measure Help needed turning from your back to your side while in a flat bed without using bedrails?: None Help needed moving from lying on your back to sitting on the side of a flat bed without using bedrails?: None Help needed moving to and from a bed to a chair (including a wheelchair)?: None Help needed standing  up from a chair using your arms (e.g., wheelchair or bedside chair)?: None Help needed to walk in hospital room?: A Little Help needed climbing 3-5 steps with a railing? : A Little 6 Click Score: 22    End of Session Equipment Utilized During Treatment: Gait belt Activity Tolerance: Patient tolerated treatment well Patient left: in bed;with bed alarm set Nurse Communication: Mobility status      Time: 6147-0929 PT Time Calculation (min) (ACUTE ONLY): 20 min   Charges:   PT Evaluation $PT Eval Low Complexity: 1 Low PT Treatments $Gait Training: 8-22 mins          Alanson Puls, PT DPT 06/19/2020, 4:10 PM

## 2020-06-19 NOTE — Progress Notes (Signed)
[facility] Hospital Day(s): 1.   Post op day(s):  Marland Kitchen   Interval History: Patient seen and examined, no acute events or new complaints overnight. Patient reports feeling well today.  He denies abdominal pain.  He denies nausea or vomiting.  He report he had a good bowel movement and passing gas.  Vital signs in last 24 hours: [min-max] current  Temp:  [98 F (36.7 C)-99.1 F (37.3 C)] 98 F (36.7 C) (08/07 0725) Pulse Rate:  [61-79] 79 (08/07 0725) Resp:  [15-20] 15 (08/07 0725) BP: (99-149)/(56-93) 149/68 (08/07 0725) SpO2:  [94 %-99 %] 94 % (08/07 0725) Weight:  [83.7 kg] 83.7 kg (08/06 2100)     Height: 5\' 11"  (180.3 cm) Weight: 83.7 kg BMI (Calculated): 25.74   Physical Exam:  Constitutional: alert, cooperative and no distress  Respiratory: breathing non-labored at rest  Cardiovascular: regular rate and sinus rhythm  Gastrointestinal: soft, non-tender, and non-distended  Labs:  CBC Latest Ref Rng & Units 06/19/2020 06/18/2020 03/23/2020  WBC 4.0 - 10.5 K/uL 11.1(H) 10.0 8.4  Hemoglobin 13.0 - 17.0 g/dL 9.4(L) 11.4(L) 11.3(L)  Hematocrit 39 - 52 % 28.8(L) 33.5(L) 34.3(L)  Platelets 150 - 400 K/uL 140(L) 171 167   CMP Latest Ref Rng & Units 06/19/2020 06/18/2020 03/23/2020  Glucose 70 - 99 mg/dL 90 185(H) 122(H)  BUN 8 - 23 mg/dL 18 19 21   Creatinine 0.61 - 1.24 mg/dL 1.18 1.13 1.32(H)  Sodium 135 - 145 mmol/L 139 137 140  Potassium 3.5 - 5.1 mmol/L 3.8 4.1 4.5  Chloride 98 - 111 mmol/L 105 101 105  CO2 22 - 32 mmol/L 26 24 27   Calcium 8.9 - 10.3 mg/dL 7.7(L) 8.7(L) 8.3(L)  Total Protein 6.5 - 8.1 g/dL - 6.4(L) -  Total Bilirubin 0.3 - 1.2 mg/dL - 0.8 -  Alkaline Phos 38 - 126 U/L - 59 -  AST 15 - 41 U/L - 20 -  ALT 0 - 44 U/L - 11 -    Imaging studies: No new pertinent imaging studies   Assessment/Plan:  86 y.o. male with enteritis causing depression, complicated by pertinent comorbidities including history of CVA, GERD, history of seizure, ITP.  Patient  recovering slowly.  There has been no nausea or vomiting.  There is no abdominal pain.  Patient passing gas and having bowel movement.  Patient tolerated clear liquids.  I agree with advancing to full liquids and continue to advance as tolerated.  Patient also being worked up for seizures.  From the suspected bowel obstruction I agree with current management.  We will continue to follow along to assist in the management of this patient.   Arnold Long, MD

## 2020-06-19 NOTE — Consult Note (Addendum)
NEUROLOGY CONSULTATION NOTE   Date of service: June 19, 2020 Patient Name: [name] MRN:  [id number] DOB:  1932/05/07 Reason for consult: "recent strokes, concern for seizures"  History of Present Illness  [name] is a 86 y.o. male with PMH significant for HTN, ITP, GERD, prior remote cortical and subcortical infarcts involving the posterior right frontal lobe and anterior right parietal lobe and BL thalamic lacunar infarcts and left cerebellar infarct who presents with episode of teeth clattering, stiffness and unarousable afterwards.  He was seen by our team back in January 2021 for an event out of sleep. Per notes, "wife states that 3:30 AM, she woke up to hear her husband making strange and audible noises.  She tried to wake him up however she was unable to." Based on the semiology of the event and the fac tthat this was a second event, he was started on Keppra 500mg  BID. He was seen outpatient by Neurology and a 3 hour EEG in awake and asleep states was normal and he was taken off the [place].  He presents today with an event overnight with clattering of his teeth that appeared rhythmic and lasted about 10-15 secs and then he was stiff and unresponsive. EMS could not wake him up with a sternal rub. His eyes were rolled up in the back of his head. EMS brought him in and he had a projectile vomiting in the Ambulance. He is diagnosed with small bowel obstruction and is being followed by surgery.  He had an identical event back in January with clattering of teeth in sleep and then difficult to arouse, unclear if he was stiff back then.  In march, he sas in [facility] when he had a 30 min episode of word salad and not responding to [place]. The patient reported that he could hear people talking to him but could not reply to them. He had a similar event in May 2021 where he was just blabbering with word salad for 30 mins again.   ROS   Constitutional Denies weight loss, fever and  chills.  HEENT Denies changes in vision and hearing.  Respiratory Denies SOB and cough.  CV Denies palpitations and CP  GI Denies abdominal pain, nausea, vomiting and diarrhea.  GU Denies dysuria and urinary frequency.  MSK Denies myalgia and joint pain.  Skin Denies rash, some pruritis in leg and a wound in the R lower leg  Neurological Denies headache and syncope.  Psychiatric Denies recent changes in mood. Denies anxiety and depression.   Past History   Past Medical History:  Diagnosis Date  . Allergic rhinitis   . Arthropathy   . B12 deficiency anemia   . Chronic neck pain   . CVA (cerebral infarction)   . GERD (gastroesophageal reflux disease)   . History of seizures   . HTN (hypertension)   . IDA (iron deficiency anemia)   . ITP (idiopathic thrombocytopenic purpura) 07/16/14   Past Surgical History:  Procedure Laterality Date  . CHOLECYSTECTOMY  10/13/1988  . COLONOSCOPY  10/2014, 08/2014   Dr. Gwenlyn Perking  . UPPER GI ENDOSCOPY     Family History  Problem Relation Age of Onset  . Heart disease Other   . Cirrhosis Mother    Social History   Socioeconomic History  . Marital status: Married    Spouse name: Not on file  . Number of children: Not on file  . Years of education: Not on file  . Highest education level:  Not on file  Occupational History  . Not on file  Tobacco Use  . Smoking status: Never Smoker  . Smokeless tobacco: Never Used  Vaping Use  . Vaping Use: Never used  Substance and Sexual Activity  . Alcohol use: No    Alcohol/week: 0.0 standard drinks  . Drug use: No  . Sexual activity: Never  Other Topics Concern  . Not on file  Social History Narrative  . Not on file   Social Determinants of Health   Financial Resource Strain:   . Difficulty of Paying Living Expenses:   Food Insecurity:   . Worried About Charity fundraiser in the Last Year:   . Arboriculturist in the Last Year:   Transportation Needs:   . Film/video editor  (Medical):   Marland Kitchen Lack of Transportation (Non-Medical):   Physical Activity:   . Days of Exercise per Week:   . Minutes of Exercise per Session:   Stress:   . Feeling of Stress :   Social Connections:   . Frequency of Communication with Friends and Family:   . Frequency of Social Gatherings with Friends and Family:   . Attends Religious Services:   . Active Member of Clubs or Organizations:   . Attends Archivist Meetings:   Marland Kitchen Marital Status:    Allergies  Allergen Reactions  . Meperidine Hives    Medications   Medications Prior to Admission  Medication Sig Dispense Refill Last Dose  . aspirin EC 81 MG tablet Take 81 mg by mouth 2 (two) times daily.    06/17/2020 at 0200  . Ibuprofen (ADVIL LIQUI-GELS MINIS) 200 MG CAPS Take 400 mg by mouth at bedtime.   06/17/2020 at 2359  . levothyroxine (SYNTHROID) 50 MCG tablet Take 50 mcg by mouth daily.   06/17/2020 at 0900  . nystatin cream (MYCOSTATIN) Apply topically 2 (two) times daily.   prn at prn  . Probiotic Product ([place]) Take by mouth daily.   06/17/2020 at 1000  . RABEprazole (ACIPHEX) 20 MG tablet Take 1 tablet by mouth daily. 1 hour before meals   06/17/2020 at 1000  . acetaminophen (TYLENOL) 500 MG tablet Take 500 mg by mouth every 4 (four) hours as needed.  (Patient not taking: Reported on 06/18/2020)   Not Taking at Unknown time     Vitals  Temp:  [98 F (36.7 C)-99.1 F (37.3 C)] 98 F (36.7 C) (08/07 0725) Pulse Rate:  [61-79] 79 (08/07 0725) Resp:  [15-20] 15 (08/07 0725) BP: (99-149)/(56-93) 149/68 (08/07 0725) SpO2:  [94 %-99 %] 94 % (08/07 0725) Weight:  [83.7 kg] 83.7 kg (08/06 2100)  Body mass index is 25.73 kg/m.  Physical Exam   General: Laying comfortably in bed; in no acute distress.  HENT: Normal oropharynx and mucosa. Normal external appearance of ears and nose. Neck: Supple, no pain or tenderness CV: No JVD. No peripheral edema. Pulmonary: Symmetric Chest rise. Normal respiratory  effort. Abdomen: Soft to touch, non-tender Ext: No cyanosis, edema, or deformity  Skin: No rash. Normal palpation of skin.   Musculoskeletal: Normal digits and nails by inspection. No clubbing.  Neurologic Examination  Mental status/Cognition: Alert, oriented to self, place, month and year, good attention. Speech/language: Fluent, comprehension intact, object naming intact, repetition intact. Cranial nerves:   CN II Pupils equal and reactive to light, no VF deficits   CN III,IV,VI EOM intact, no gaze preference or deviation, no nystagmus  CN V normal sensation in V1, V2, and V3 segments bilaterally   CN VII no asymmetry, no nasolabial fold flattening   CN VIII normal hearing to speech   CN IX & X normal palatal elevation, no uvular deviation   CN XI 5/5 head turn and 5/5 shoulder shrug bilaterally   CN XII midline tongue protrusion   Motor:  Muscle bulk: normal, tone normal, pronator drift none Mvmt Root Nerve  Muscle Right Left Comments  SA C5/6 Ax Deltoid 5 5   EF C5/6 Mc Biceps 5 5   EE C6/7/8 Rad Triceps 5 5   WF C6/7 Med FCR 5 5   WE C7/8 PIN ECU 5 5   F Ab C8/T1 U ADM/FDI 5 5   HF L1/2/3 Fem Illopsoas 5 5   KE L2/3/4 Fem Quad 5 5   DF L4/5 D Peron Tib Ant 5 5   PF S1/2 Tibial Grc/Sol 5 5    Reflexes:  Right Left Comments  Pectoralis      Biceps (C5/6) 2 2   Brachioradialis (C5/6) 2 2    Triceps (C6/7) 2 2    Patellar (L3/4) 1 1    Achilles (S1) 1 1    Hoffman      Plantar mute mute   Jaw jerk    Sensation:  Light touch Intact throughout   Pin prick    Temperature    Vibration   Proprioception    Coordination/Complex Motor:  - Finger to Nose intact BL - Heel to shin intact BL. - Rapid alternating movement intact. - Gait: not assessed due to seizure precautions and SBO  Labs   Lab Results  Component Value Date   NA 139 06/19/2020   K 3.8 06/19/2020   CL 105 06/19/2020   CO2 26 06/19/2020   GLUCOSE 90 06/19/2020   BUN 18 06/19/2020   CREATININE  1.18 06/19/2020   CALCIUM 7.7 (L) 06/19/2020   ALBUMIN 3.7 06/18/2020   AST 20 06/18/2020   ALT 11 06/18/2020   ALKPHOS 59 06/18/2020   BILITOT 0.8 06/18/2020   GFRNONAA 56 (L) 06/19/2020   GFRAA >60 06/19/2020     Imaging and Diagnostic studies  CTH without contrast: 1. No acute intracranial abnormalities. 2. Chronic atrophy and small vessel ischemia. 3. Right mastoid effusions    Impression   OAKLEE SUNGA is a 86 y.o. male with PMH significant for HTN, ITP, GERD, prior remote cortical and subcortical infarcts involving the posterior right frontal lobe and anterior right parietal lobe and BL thalamic lacunar infarcts and left cerebellar infarct who presents with episode of teeth clattering, stiffness and unarousable afterwards.  The semiology of the event especially with this presenting out of sleep in an identical event back in January does make me think that this could potentially be a seizure.  He does have risk factors including cortical and subcortical infarct specifically in the posterior right frontal and anterior right parietal lobe.  I understand that he has had a 3-hour EEG which was negative but it did not capture any of his events. He has been off [place] since March 2021 but given that this is his second identical event in sleep, I am leaning towards putting him back on Keppra and having him follow-up outpatient. We can attempt a 24-hour continuous EEG to see if we can capture one of his spells but I doubt that an event will be captured given that they are so infrequent.  His neuro exam does  not demonstrate any focal deficit and he is back to his baseline.  CT head with no acute abnormalities.  Recommendations  -I restarted his Keppra at 500 mg twice daily. -Recommend that he follow-up with his neurologist outpatient and see if he should get a 24-hour continuous EEG.  Although I doubt that I will be able to capture his event given that they are so far in between. - No  driving and this was discussed with patient and family. He should be event free for 6 months atleast. ______________________________________________________________________   Thank you for the opportunity to take part in the care of this patient. If you have any further questions, please contact the neurology consultation attending.  Signed,  [place] Pager Number [phone]

## 2020-06-19 NOTE — Progress Notes (Signed)
PROGRESS NOTE    [name]  OHY:[id number] DOB: Mar 15, 1932 DOA: 06/18/2020 PCP: Lynnell Jude, MD    Assessment & Plan:   Principal Problem:   SBO (small bowel obstruction) ([place]) Active Problems:   Idiopathic thrombocytopenic purpura ([place])   GERD (gastroesophageal reflux disease)   Aspiration pneumonia ([place])   Altered mental status    [name] is a 86 y.o. Caucasian male with medical history significant for CVA, seizure disorder, hypertension, history of ITP, GERD who was brought into the emergency room by EMS after his wife called for evaluation of change in mental status.   Small bowel obstruction --Patient presented for evaluation of periumbilical pain associated with nausea and vomiting. He has had prior abdominal surgery. --GenSurg consulted --tolerated clear liquid diet PLAN: --advance to Full liquid --Hold MIVF and encourage oral hydration  Episodes of AMS concerning for seizure activity Hx of prior seizure --Per family, pt had 1 episode of seizure after he had a stroke.  Has had neg EEG in the past.  Currently not on anti-epileptics.   PLAN: --Neuro consult today --Start Keppra 500 mg BID, per neuro  Aspiration pneumonitis --Patient with new hypoxia, room air pulse oximetry was in the low 80s on presentation. imaging shows bilateral infiltrates suggestive of possible aspiration related to emesis --started on IV Unasyn. --already back to RA this morning PLAN: --continue IV Unasyn for now  GERD --continue PPI, switch back to oral    DVT prophylaxis: Lovenox SQ Code Status: Full code  Family Communication: wife updated at bedside today Status is: inpatient Dispo:   The patient is from: home Anticipated d/c is to: home Anticipated d/c date is: tomorrow Patient currently is not medically stable to d/c due to: pending neuro consult   Subjective and Interval History:  Pt reported no N/V/D, no abdominal pain.  Tolerating clear liquid diet.  Wife  is concerned about episodes of AMS, so neurology consulted.   Objective: Vitals:   06/18/20 2307 06/19/20 0459 06/19/20 0725 06/19/20 1208  BP: (!) 141/66 138/63 (!) 149/68 135/74  Pulse: 64 69 79 70  Resp: 20 20 15 16   Temp: 99.1 F (37.3 C) 98.4 F (36.9 C) 98 F (36.7 C) 98.3 F (36.8 C)  TempSrc: Oral Oral Oral Oral  SpO2: 98% 94% 94% 94%  Weight:      Height:        Intake/Output Summary (Last 24 hours) at 06/19/2020 1654 Last data filed at 06/19/2020 1320 Gross per 24 hour  Intake 2791.17 ml  Output 1000 ml  Net 1791.17 ml   Filed Weights   06/18/20 2100  Weight: 83.7 kg    Examination:   Constitutional: NAD, alert, oriented to self and place HEENT: conjunctivae and lids normal, EOMI CV: RRR no M,R,G. Distal pulses +2.  No cyanosis.   RESP: CTA B/L, normal respiratory effort  GI: +BS, NTND Extremities: Swelling in BLE SKIN: warm, dry.  Venous stasis ulcer over left lower leg. Neuro: II - XII grossly intact.  Sensation intact Psych: Normal mood and affect.    Data Reviewed: I have personally reviewed following labs and imaging studies  CBC: Recent Labs  Lab 06/18/20 0409 06/19/20 0455  WBC 10.0 11.1*  NEUTROABS 7.5  --   HGB 11.4* 9.4*  HCT 33.5* 28.8*  MCV 87.5 89.4  PLT 171 948*   Basic Metabolic Panel: Recent Labs  Lab 06/18/20 0409 06/19/20 0455  NA 137 139  K 4.1 3.8  CL 101 105  CO2 24 26  GLUCOSE 185* 90  BUN 19 18  CREATININE 1.13 1.18  CALCIUM 8.7* 7.7*   GFR: Estimated Creatinine Clearance: 47.9 mL/min (by C-G formula based on SCr of 1.18 mg/dL). Liver Function Tests: Recent Labs  Lab 06/18/20 0409  AST 20  ALT 11  ALKPHOS 59  BILITOT 0.8  PROT 6.4*  ALBUMIN 3.7   No results for input(s): LIPASE, AMYLASE in the last 168 hours. No results for input(s): AMMONIA in the last 168 hours. Coagulation Profile: Recent Labs  Lab 06/18/20 0409  INR 1.0   Cardiac Enzymes: No results for input(s): CKTOTAL, CKMB, CKMBINDEX,  TROPONINI in the last 168 hours. BNP (last 3 results) No results for input(s): PROBNP in the last 8760 hours. HbA1C: No results for input(s): HGBA1C in the last 72 hours. CBG: No results for input(s): GLUCAP in the last 168 hours. Lipid Profile: No results for input(s): CHOL, HDL, LDLCALC, TRIG, CHOLHDL, LDLDIRECT in the last 72 hours. Thyroid Function Tests: No results for input(s): TSH, T4TOTAL, FREET4, T3FREE, THYROIDAB in the last 72 hours. Anemia Panel: No results for input(s): VITAMINB12, FOLATE, FERRITIN, TIBC, IRON, RETICCTPCT in the last 72 hours. Sepsis Labs: Recent Labs  Lab 06/18/20 0409 06/18/20 0555  PROCALCITON <0.10  --   LATICACIDVEN 3.4* 1.5    Recent Results (from the past 240 hour(s))  Blood Culture (routine x 2)     Status: None (Preliminary result)   Collection Time: 06/18/20  4:09 AM   Specimen: BLOOD  Result Value Ref Range Status   Specimen Description BLOOD BLOOD RIGHT WRIST  Final   Special Requests   Final    BOTTLES DRAWN AEROBIC AND ANAEROBIC Blood Culture adequate volume   Culture   Final    NO GROWTH 1 DAY Performed at [facility], [address] [place], [place] [address]    Report Status PENDING  Incomplete  Blood Culture (routine x 2)     Status: None (Preliminary result)   Collection Time: 06/18/20  4:09 AM   Specimen: BLOOD  Result Value Ref Range Status   Specimen Description BLOOD BLOOD LEFT FOREARM  Final   Special Requests   Final    BOTTLES DRAWN AEROBIC AND ANAEROBIC Blood Culture adequate volume   Culture   Final    NO GROWTH 1 DAY Performed at [facility], [address] [place], [place] [address]    Report Status PENDING  Incomplete  SARS Coronavirus 2 by RT PCR (hospital order, performed in [facility] hospital lab) Nasopharyngeal Nasopharyngeal Swab     Status: None   Collection Time: 06/18/20  4:09 AM   Specimen: Nasopharyngeal Swab  Result Value Ref Range Status   SARS Coronavirus 2 NEGATIVE  NEGATIVE Final    Comment: (NOTE) SARS-CoV-2 target nucleic acids are NOT DETECTED.  The SARS-CoV-2 RNA is generally detectable in upper and lower respiratory specimens during the acute phase of infection. The lowest concentration of SARS-CoV-2 viral copies this assay can detect is 250 copies / mL. A negative result does not preclude SARS-CoV-2 infection and should not be used as the sole basis for treatment or other patient management decisions.  A negative result may occur with improper specimen collection / handling, submission of specimen other than nasopharyngeal swab, presence of viral mutation(s) within the areas targeted by this assay, and inadequate number of viral copies (<250 copies / mL). A negative result must be combined with clinical observations, patient history, and epidemiological information.  Fact Sheet  for Patients:   StrictlyIdeas.no  Fact Sheet for Healthcare Providers: BankingDealers.co.za  This test is not yet approved or  cleared by the [place] FDA and has been authorized for detection and/or diagnosis of SARS-CoV-2 by FDA under an Emergency Use Authorization (EUA).  This EUA will remain in effect (meaning this test can be used) for the duration of the COVID-19 declaration under Section 564(b)(1) of the Act, 21 U.S.C. section 360bbb-3(b)(1), unless the authorization is terminated or revoked sooner.  Performed at [facility], [address], [place], [place] [address]   Urine culture     Status: Abnormal   Collection Time: 06/18/20  5:55 AM   Specimen: In/Out Cath Urine  Result Value Ref Range Status   Specimen Description   Final    IN/OUT CATH URINE Performed at [facility], [address], [place], [place] [address]    Special Requests   Final    NONE Performed at [facility], [address] [place], [place] [address]    Culture MULTIPLE SPECIES PRESENT,  SUGGEST RECOLLECTION (A)  Final   Report Status 06/19/2020 FINAL  Final      Radiology Studies: CT Abdomen Pelvis Wo Contrast  Result Date: 06/18/2020 CLINICAL DATA:  Nausea and vomiting EXAM: CT ABDOMEN AND PELVIS WITHOUT CONTRAST TECHNIQUE: Multidetector CT imaging of the abdomen and pelvis was performed following the standard protocol without IV contrast. COMPARISON:  None. FINDINGS: Lower chest: Atelectasis or infiltration in the lung bases. Possibly aspiration in the setting of vomiting. Cardiac enlargement with coronary artery calcifications. Hepatobiliary: Unenhanced appearance of the liver is normal. Gallbladder is not identified, possibly surgically absent. No bile duct dilatation. Pancreas: Unremarkable. No pancreatic ductal dilatation or surrounding inflammatory changes. Spleen: Normal in size without focal abnormality. Adrenals/Urinary Tract: Adrenal glands are unremarkable. Kidneys are normal, without renal calculi, focal lesion, or hydronephrosis. Bladder is unremarkable. Stomach/Bowel: Stomach is moderately distended. No gastric wall thickening. Dilated fluid-filled small bowel in the mid abdomen with bowel wall thickening and small bowel feces sign. The terminal ileum is decompressed. Transition zone appears to be in the right lower quadrant. No definite pneumatosis. Colon is not abnormally distended but is diffusely stool-filled. Severe diverticulosis of the sigmoid colon. No obvious diverticulitis. Vascular/Lymphatic: Aortic atherosclerosis. No enlarged abdominal or pelvic lymph nodes. Reproductive: Prostate is unremarkable. Other: Small amount of free fluid in the pelvis, possibly reactive. No free air. Musculoskeletal: Degenerative changes in the spine. No destructive bone lesions. IMPRESSION: 1. Small bowel obstruction with transition zone in the right lower quadrant. Small bowel wall thickening may indicate associated enteritis. 2. Atelectasis or infiltration in the lung bases. Possibly  aspiration in the setting of vomiting. 3. Small amount of free fluid in the pelvis, possibly reactive. 4. Severe sigmoid colon diverticulosis without evidence of diverticulitis. 5. Aortic atherosclerosis. Aortic Atherosclerosis (ICD10-I70.0). Electronically Signed   By: Lucienne Capers M.D.   On: 06/18/2020 05:27   CT Head Wo Contrast  Result Date: 06/18/2020 CLINICAL DATA:  Lethargic and altered mental status. Nausea and vomiting. EXAM: CT HEAD WITHOUT CONTRAST TECHNIQUE: Contiguous axial images were obtained from the base of the skull through the vertex without intravenous contrast. COMPARISON:  11/30/2019 FINDINGS: Brain: Diffuse cerebral atrophy. Ventricular dilatation consistent with central atrophy. Low-attenuation changes in the deep white matter consistent with small vessel ischemia. No abnormal extra-axial fluid collections. No mass effect or midline shift. Gray-white matter junctions are distinct. Basal cisterns are not effaced. No acute intracranial hemorrhage. Vascular: Vascular calcifications are  present. Skull: Calvarium appears intact. Sinuses/Orbits: Paranasal sinuses are clear. Opacification of the right mastoid air cells with loss of internal septation. Other: None. IMPRESSION: 1. No acute intracranial abnormalities. 2. Chronic atrophy and small vessel ischemia. 3. Right mastoid effusions. Electronically Signed   By: Lucienne Capers M.D.   On: 06/18/2020 05:22   DG Chest Port 1 View  Result Date: 06/18/2020 CLINICAL DATA:  Questionable sepsis EXAM: PORTABLE CHEST 1 VIEW COMPARISON:  None. FINDINGS: Shallow inspiration with elevation of the left hemidiaphragm. Atelectasis in the lung bases. Cardiac enlargement. No vascular congestion or consolidation. No pleural effusions. IMPRESSION: Shallow inspiration with atelectasis in the lung bases. Cardiac enlargement. Electronically Signed   By: Lucienne Capers M.D.   On: 06/18/2020 04:28     Scheduled Meds: . enoxaparin (LOVENOX) injection   40 mg Subcutaneous Q24H  . levETIRAcetam  500 mg Oral BID  . levETIRAcetam  500 mg Oral Once  . pantoprazole (PROTONIX) IV  40 mg Intravenous Q24H   Continuous Infusions: . ampicillin-sulbactam (UNASYN) IV 3 g (06/19/20 1416)     LOS: 1 day     Enzo Bi, MD [facility] If 7PM-7AM, please contact night-coverage 06/19/2020, 4:54 PM

## 2020-06-20 LAB — BASIC METABOLIC PANEL
Anion gap: 5 (ref 5–15)
BUN: 18 mg/dL (ref 8–23)
CO2: 26 mmol/L (ref 22–32)
Calcium: 8 mg/dL — ABNORMAL LOW (ref 8.9–10.3)
Chloride: 108 mmol/L (ref 98–111)
Creatinine, Ser: 1.09 mg/dL (ref 0.61–1.24)
GFR calc Af Amer: >60 mL/min (ref >60)
GFR calc non Af Amer: >60 mL/min (ref >60)
Glucose, Bld: 97 mg/dL (ref 70–99)
Potassium: 3.8 mmol/L (ref 3.5–5.1)
Sodium: 139 mmol/L (ref 135–145)

## 2020-06-20 LAB — MAGNESIUM: Magnesium: 1.6 mg/dL — ABNORMAL LOW (ref 1.7–2.4)

## 2020-06-20 LAB — CBC
HCT: 29.9 % — ABNORMAL LOW (ref 39.0–52.0)
Hemoglobin: 9.9 g/dL — ABNORMAL LOW (ref 13.0–17.0)
MCH: 29.2 pg (ref 26.0–34.0)
MCHC: 33.1 g/dL (ref 30.0–36.0)
MCV: 88.2 fL (ref 80.0–100.0)
Platelets: 137 10*3/uL — ABNORMAL LOW (ref 150–400)
RBC: 3.39 MIL/uL — ABNORMAL LOW (ref 4.22–5.81)
RDW: 14.6 % (ref 11.5–15.5)
WBC: 7.8 10*3/uL (ref 4.0–10.5)
nRBC: 0 % (ref 0.0–0.2)

## 2020-06-20 MED ORDER — MAGNESIUM SULFATE 2 GM/50ML IV SOLN
2.0000 g | Freq: Once | INTRAVENOUS | Status: DC
  Filled 2020-06-20: qty 50

## 2020-06-20 MED ORDER — MAGNESIUM OXIDE 400 (241.3 MG) MG PO TABS
800.0000 mg | ORAL_TABLET | Freq: Once | ORAL | Status: AC
  Administered 2020-06-20: 800 mg via ORAL
  Filled 2020-06-20: qty 2

## 2020-06-20 MED ORDER — IBUPROFEN 200 MG PO CAPS: 400.0000 mg | ORAL_CAPSULE | Freq: Every evening | ORAL | 0 refills | Status: DC | PRN

## 2020-06-20 MED ORDER — LEVETIRACETAM 500 MG PO TABS: 500.0000 mg | ORAL_TABLET | Freq: Two times a day (BID) | ORAL | 2 refills | Status: DC

## 2020-06-20 NOTE — Progress Notes (Signed)
[place] [facility] Day(s): 2.   Post op day(s):  Marland Kitchen   Interval History: Patient seen and examined, no acute events or new complaints overnight. Patient reports pain well.  He denies abdominal pain.  He denies nausea or vomiting.  Reports tolerating diet.  There is no pain radiation.  There is no alleviating or rating factor.  Vital signs in last 24 hours: [min-max] current  Temp:  [97.7 F (36.5 C)-98.3 F (36.8 C)] 97.7 F (36.5 C) (08/08 0359) Pulse Rate:  [70-81] 81 (08/08 0359) Resp:  [16-17] 16 (08/08 0359) BP: (135-161)/(70-80) 135/78 (08/08 0500) SpO2:  [94 %-99 %] 99 % (08/08 0359)     Height: 5\' 11"  (180.3 cm) Weight: 83.7 kg BMI (Calculated): 25.74   Physical Exam:  Constitutional: alert, cooperative and no distress  Respiratory: breathing non-labored at rest  Cardiovascular: regular rate and sinus rhythm  Gastrointestinal: soft, non-tender, and non-distended  Labs:  CBC Latest Ref Rng & Units 06/20/2020 06/19/2020 06/18/2020  WBC 4.0 - 10.5 K/uL 7.8 11.1(H) 10.0  Hemoglobin 13.0 - 17.0 g/dL 9.9(L) 9.4(L) 11.4(L)  Hematocrit 39 - 52 % 29.9(L) 28.8(L) 33.5(L)  Platelets 150 - 400 K/uL 137(L) 140(L) 171   CMP Latest Ref Rng & Units 06/20/2020 06/19/2020 06/18/2020  Glucose 70 - 99 mg/dL 97 90 185(H)  BUN 8 - 23 mg/dL 18 18 19   Creatinine 0.61 - 1.24 mg/dL 1.09 1.18 1.13  Sodium 135 - 145 mmol/L 139 139 137  Potassium 3.5 - 5.1 mmol/L 3.8 3.8 4.1  Chloride 98 - 111 mmol/L 108 105 101  CO2 22 - 32 mmol/L 26 26 24   Calcium 8.9 - 10.3 mg/dL 8.0(L) 7.7(L) 8.7(L)  Total Protein 6.5 - 8.1 g/dL - - 6.4(L)  Total Bilirubin 0.3 - 1.2 mg/dL - - 0.8  Alkaline Phos 38 - 126 U/L - - 59  AST 15 - 41 U/L - - 20  ALT 0 - 44 U/L - - 11    Imaging studies: No new pertinent imaging studies   Assessment/Plan:  86 y.o.malewith enteritis causing obstruction, complicated by pertinent comorbidities includinghistory of CVA, GERD, history of seizure, ITP.  Patient with  resolving enteritis.  Tolerating diet.  No abdominal pain.  There is no further surgical management.  Patient does not need outpatient follow-up from the surgical standpoint.  Patient to continue care with primary care physician, cardiologist and neurologist.  General surgery will sign off.  Arnold Long, MD

## 2020-06-20 NOTE — Discharge Instructions (Signed)
Bowel Obstruction °A bowel obstruction means that something is blocking the small or large bowel. The bowel is also called the intestine. It is the long tube that connects the stomach to the opening of the butt (anus). When something blocks the bowel, food and fluids cannot pass through like normal. This condition needs to be treated. Treatment depends on the cause of the problem and how bad the problem is. °What are the causes? °Common causes of this condition include: °· Scar tissue (adhesions) from past surgery or from high-energy X-rays (radiation). °· Recent surgery in the belly. This affects how food moves in the bowel. °· Some diseases, such as: °? Irritation of the lining of the digestive tract (Crohn's disease). °? Irritation of small pouches in the bowel (diverticulitis). °· Growths or tumors. °· A bulging organ (hernia). °· Twisting of the bowel (volvulus). °· A foreign body. °· Slipping of a part of the bowel into another part (intussusception). °What are the signs or symptoms? °Symptoms of this condition include: °· Pain in the belly. °· Feeling sick to your stomach (nauseous). °· Throwing up (vomiting). °· Bloating in the belly. °· Being unable to pass gas. °· Trouble pooping (constipation). °· Watery poop (diarrhea). °· A lot of belching. °How is this diagnosed? °This condition may be diagnosed based on: °· A physical exam. °· Medical history. °· Imaging tests, such as X-ray or CT scan. °· Blood tests. °· Urine tests. °How is this treated? °Treatment for this condition may include: °· Fluids and pain medicines that are given through an IV tube. Your doctor may tell you not to eat or drink if you feel sick to your stomach and are throwing up. °· Eating a clear liquid diet for a few days. °· Putting a small tube (nasogastric tube) into the stomach. This will help with pain, discomfort, and nausea by removing blocked air and fluids from the stomach. °· Surgery. This may be needed if other treatments do  not work. °Follow these instructions at home: °Medicines °· Take over-the-counter and prescription medicines only as told by your doctor. °· If you were prescribed an antibiotic medicine, take it as told by your doctor. Do not stop taking the antibiotic even if you start to feel better. °General instructions °· Follow your diet as told by your doctor. You may need to: °? Only drink clear liquids until you start to get better. °? Avoid solid foods. °· Return to your normal activities as told by your doctor. Ask your doctor what activities are safe for you. °· Do not sit for a long time without moving. Get up to take short walks every 1-2 hours. This is important. Ask for help if you feel weak or unsteady. °· Keep all follow-up visits as told by your doctor. This is important. °How is this prevented? °After having a bowel obstruction, you may be more likely to have another. You can do some things to stop it from happening again. °· If you have a long-term (chronic) disease, contact your doctor if you see changes or problems. °· Take steps to prevent or treat trouble pooping. Your doctor may ask that you: °? Drink enough fluid to keep your pee (urine) pale yellow. °? Take over-the-counter or prescription medicines. °? Eat foods that are high in fiber. These include beans, whole grains, and fresh fruits and vegetables. °? Limit foods that are high in fat and sugar. These include fried or sweet foods. °· Stay active. Ask your doctor which exercises are   safe for you. °· Avoid stress. °· Eat three small meals and three small snacks each day. °· Work with a food expert (dietitian) to make a meal plan that works for you. °· Do not use any products that contain nicotine or tobacco, such as cigarettes and e-cigarettes. If you need help quitting, ask your doctor. °Contact a doctor if: °· You have a fever. °· You have chills. °Get help right away if: °· You have pain or cramps that get worse. °· You throw up blood. °· You are  sick to your stomach. °· You cannot stop throwing up. °· You cannot drink fluids. °· You feel mixed up (confused). °· You feel very thirsty (dehydrated). °· Your belly gets more bloated. °· You feel weak or you pass out (faint). °Summary °· A bowel obstruction means that something is blocking the small or large bowel. °· Treatment may include IV fluids and pain medicine. You may also have a clear liquid diet, a small tube in your stomach, or surgery. °· Drink clear liquids and avoid solid foods until you get better. °This information is not intended to replace advice given to you by your health care provider. Make sure you discuss any questions you have with your health care provider. °Document Revised: 03/13/2018 Document Reviewed: 03/13/2018 °Elsevier Patient Education © 2020 [employer] ° °

## 2020-06-20 NOTE — Discharge Summary (Signed)
Physician Discharge Summary   [name]  male DOB: 23-Feb-1932  TIW:[id number]  PCP: [name] Jude, MD  Admit date: 06/18/2020 Discharge date: 06/20/2020  Admitted From: home Disposition:  Home. Home Health: No  None recommended. CODE STATUS: Full code  Discharge Instructions    Discharge instructions   Complete by: As directed    Neurologist had seen you during your hospitalization, and prescribed you Keppra for seizure prevention.  Please follow up with your outpatient neurologist for further management.   Dr. Enzo [name] [facility] Course:  For full details, please see H&P, progress notes, consult notes and ancillary notes.  Briefly,  [name] a 86 y.o.Caucasian malewith medical history significant forCVA, seizure disorder, hypertension, history of ITP, GERD who was brought into the emergency room by EMS after his wife called for evaluation of change in mental status.   Small bowel obstruction Patient presented with periumbilical pain associated with nausea and vomiting.  He has had prior abdominal surgery.  GenSurg consulted and recommended conservative management.  Pt didn't need a NG tube, and symptoms resolved the next day.  Diet was advanced from clear liquid to regular prior to discharge.  Episodes of AMS concerning for seizure activity Hx of prior seizure Per wife, pt had 1 episode of seizure after he had a stroke.  Has had neg EEG in the past.  Not on anti-epileptics at home prior to presentation.  Neuro consulted and started pt on Keppra 500 mg BID.  Pt is to follow up with his outpatient neurologist for further management.  Aspiration pneumonitis Patient with new hypoxia,room air pulse oximetry was in the low 80s on presentation.  Imaging showed bilateral infiltrates suggestive of possible aspiration related to emesis.  Pt was started on cefepime and flagyl on presentation, followed by 2 days of IV Unasyn.  Pt was already back to RA the next  day and denied dyspnea or cough.  GERD continued PPI   Discharge Diagnoses:  Principal Problem:   SBO (small bowel obstruction) (HCC) Active Problems:   Idiopathic thrombocytopenic purpura (HCC)   GERD (gastroesophageal reflux disease)   Aspiration pneumonia (HCC)   Altered mental status    Discharge Instructions:  Allergies as of 06/20/2020      Reactions   Meperidine Hives      Medication List    STOP taking these medications   TYLENOL 500 MG tablet Generic drug: acetaminophen     TAKE these medications   aspirin EC 81 MG tablet Take 81 mg by mouth 2 (two) times daily.   Ibuprofen 200 MG Caps Commonly known as: Advil Liqui-Gels minis Take 2 capsules (400 mg total) by mouth at bedtime as needed. What changed:   when to take this  reasons to take this   levETIRAcetam 500 MG tablet Commonly known as: KEPPRA Take 1 tablet (500 mg total) by mouth 2 (two) times daily. Seizure prevention.   levothyroxine 50 MCG tablet Commonly known as: SYNTHROID Take 50 mcg by mouth daily.   nystatin cream Commonly known as: MYCOSTATIN Apply topically 2 (two) times daily.   PHILLIPS COLON HEALTH PO Take by mouth daily.   RABEprazole 20 MG tablet Commonly known as: ACIPHEX Take 1 tablet by mouth daily. 1 hour before meals        Follow-up Information    [name] Jude, MD. Schedule an appointment as soon as possible for a visit in 1 week(s).   Specialty:  Family Medicine Contact information: [place] [address] [phone]        [name] Sable, MD .   Specialties: Cardiology, Radiology Contact information: [place] [address] [phone]        Your outpatient neurologist. Schedule an appointment as soon as possible for a visit in 1 month(s).               Allergies  Allergen Reactions  . Meperidine Hives     The results of significant diagnostics from this hospitalization (including imaging,  microbiology, ancillary and laboratory) are listed below for reference.   Consultations:   Procedures/Studies: CT Abdomen Pelvis Wo Contrast  Result Date: 06/18/2020 CLINICAL DATA:  Nausea and vomiting EXAM: CT ABDOMEN AND PELVIS WITHOUT CONTRAST TECHNIQUE: Multidetector CT imaging of the abdomen and pelvis was performed following the standard protocol without IV contrast. COMPARISON:  None. FINDINGS: Lower chest: Atelectasis or infiltration in the lung bases. Possibly aspiration in the setting of vomiting. Cardiac enlargement with coronary artery calcifications. Hepatobiliary: Unenhanced appearance of the liver is normal. Gallbladder is not identified, possibly surgically absent. No bile duct dilatation. Pancreas: Unremarkable. No pancreatic ductal dilatation or surrounding inflammatory changes. Spleen: Normal in size without focal abnormality. Adrenals/Urinary Tract: Adrenal glands are unremarkable. Kidneys are normal, without renal calculi, focal lesion, or hydronephrosis. Bladder is unremarkable. Stomach/Bowel: Stomach is moderately distended. No gastric wall thickening. Dilated fluid-filled small bowel in the mid abdomen with bowel wall thickening and small bowel feces sign. The terminal ileum is decompressed. Transition zone appears to be in the right lower quadrant. No definite pneumatosis. Colon is not abnormally distended but is diffusely stool-filled. Severe diverticulosis of the sigmoid colon. No obvious diverticulitis. Vascular/Lymphatic: Aortic atherosclerosis. No enlarged abdominal or pelvic lymph nodes. Reproductive: Prostate is unremarkable. Other: Small amount of free fluid in the pelvis, possibly reactive. No free air. Musculoskeletal: Degenerative changes in the spine. No destructive bone lesions. IMPRESSION: 1. Small bowel obstruction with transition zone in the right lower quadrant. Small bowel wall thickening may indicate associated enteritis. 2. Atelectasis or infiltration in the lung  bases. Possibly aspiration in the setting of vomiting. 3. Small amount of free fluid in the pelvis, possibly reactive. 4. Severe sigmoid colon diverticulosis without evidence of diverticulitis. 5. Aortic atherosclerosis. Aortic Atherosclerosis (ICD10-I70.0). Electronically Signed   By: Lucienne Capers M.D.   On: 06/18/2020 05:27   CT Head Wo Contrast  Result Date: 06/18/2020 CLINICAL DATA:  Lethargic and altered mental status. Nausea and vomiting. EXAM: CT HEAD WITHOUT CONTRAST TECHNIQUE: Contiguous axial images were obtained from the base of the skull through the vertex without intravenous contrast. COMPARISON:  11/30/2019 FINDINGS: Brain: Diffuse cerebral atrophy. Ventricular dilatation consistent with central atrophy. Low-attenuation changes in the deep white matter consistent with small vessel ischemia. No abnormal extra-axial fluid collections. No mass effect or midline shift. Gray-white matter junctions are distinct. Basal cisterns are not effaced. No acute intracranial hemorrhage. Vascular: Vascular calcifications are present. Skull: Calvarium appears intact. Sinuses/Orbits: Paranasal sinuses are clear. Opacification of the right mastoid air cells with loss of internal septation. Other: None. IMPRESSION: 1. No acute intracranial abnormalities. 2. Chronic atrophy and small vessel ischemia. 3. Right mastoid effusions. Electronically Signed   By: Lucienne Capers M.D.   On: 06/18/2020 05:22   DG Chest Port 1 View  Result Date: 06/18/2020 CLINICAL DATA:  Questionable sepsis EXAM: PORTABLE CHEST 1 VIEW COMPARISON:  None. FINDINGS: Shallow inspiration with elevation of the left hemidiaphragm. Atelectasis in the  lung bases. Cardiac enlargement. No vascular congestion or consolidation. No pleural effusions. IMPRESSION: Shallow inspiration with atelectasis in the lung bases. Cardiac enlargement. Electronically Signed   By: Lucienne Capers M.D.   On: 06/18/2020 04:28      Labs: BNP (last 3 results) No  results for input(s): BNP in the last 8760 hours. Basic Metabolic Panel: Recent Labs  Lab 06/18/20 0409 06/19/20 0455 06/20/20 0506  NA 137 139 139  K 4.1 3.8 3.8  CL 101 105 108  CO2 24 26 26   GLUCOSE 185* 90 97  BUN 19 18 18   CREATININE 1.13 1.18 1.09  CALCIUM 8.7* 7.7* 8.0*  MG  --   --  1.6*   Liver Function Tests: Recent Labs  Lab 06/18/20 0409  AST 20  ALT 11  ALKPHOS 59  BILITOT 0.8  PROT 6.4*  ALBUMIN 3.7   No results for input(s): LIPASE, AMYLASE in the last 168 hours. No results for input(s): AMMONIA in the last 168 hours. CBC: Recent Labs  Lab 06/18/20 0409 06/19/20 0455 06/20/20 0506  WBC 10.0 11.1* 7.8  NEUTROABS 7.5  --   --   HGB 11.4* 9.4* 9.9*  HCT 33.5* 28.8* 29.9*  MCV 87.5 89.4 88.2  PLT 171 140* 137*   Cardiac Enzymes: No results for input(s): CKTOTAL, CKMB, CKMBINDEX, TROPONINI in the last 168 hours. BNP: Invalid input(s): POCBNP CBG: No results for input(s): GLUCAP in the last 168 hours. D-Dimer No results for input(s): DDIMER in the last 72 hours. Hgb A1c No results for input(s): HGBA1C in the last 72 hours. Lipid Profile No results for input(s): CHOL, HDL, LDLCALC, TRIG, CHOLHDL, LDLDIRECT in the last 72 hours. Thyroid function studies No results for input(s): TSH, T4TOTAL, T3FREE, THYROIDAB in the last 72 hours.  Invalid input(s): FREET3 Anemia work up No results for input(s): VITAMINB12, FOLATE, FERRITIN, TIBC, IRON, RETICCTPCT in the last 72 hours. Urinalysis    Component Value Date/Time   COLORURINE STRAW (A) 06/18/2020 0555   APPEARANCEUR CLEAR (A) 06/18/2020 0555   LABSPEC 1.009 06/18/2020 0555   PHURINE 8.0 06/18/2020 0555   GLUCOSEU 50 (A) 06/18/2020 0555   HGBUR NEGATIVE 06/18/2020 0555   BILIRUBINUR NEGATIVE 06/18/2020 0555   KETONESUR NEGATIVE 06/18/2020 0555   PROTEINUR NEGATIVE 06/18/2020 0555   NITRITE NEGATIVE 06/18/2020 0555   LEUKOCYTESUR NEGATIVE 06/18/2020 0555   Sepsis Labs Invalid input(s):  PROCALCITONIN,  WBC,  LACTICIDVEN Microbiology Recent Results (from the past 240 hour(s))  Blood Culture (routine x 2)     Status: None (Preliminary result)   Collection Time: 06/18/20  4:09 AM   Specimen: BLOOD  Result Value Ref Range Status   Specimen Description BLOOD BLOOD RIGHT WRIST  Final   Special Requests   Final    BOTTLES DRAWN AEROBIC AND ANAEROBIC Blood Culture adequate volume   Culture   Final    NO GROWTH 2 DAYS Performed at [facility], [address], [place], [place] [address]    Report Status PENDING  Incomplete  Blood Culture (routine x 2)     Status: None (Preliminary result)   Collection Time: 06/18/20  4:09 AM   Specimen: BLOOD  Result Value Ref Range Status   Specimen Description BLOOD BLOOD LEFT FOREARM  Final   Special Requests   Final    BOTTLES DRAWN AEROBIC AND ANAEROBIC Blood Culture adequate volume   Culture   Final    NO GROWTH 2 DAYS Performed at [facility], [place],  [place], [place] [address]    Report Status PENDING  Incomplete  SARS Coronavirus 2 by RT PCR (hospital order, performed in [facility] hospital lab) Nasopharyngeal Nasopharyngeal Swab     Status: None   Collection Time: 06/18/20  4:09 AM   Specimen: Nasopharyngeal Swab  Result Value Ref Range Status   SARS Coronavirus 2 NEGATIVE NEGATIVE Final    Comment: (NOTE) SARS-CoV-2 target nucleic acids are NOT DETECTED.  The SARS-CoV-2 RNA is generally detectable in upper and lower respiratory specimens during the acute phase of infection. The lowest concentration of SARS-CoV-2 viral copies this assay can detect is 250 copies / mL. A negative result does not preclude SARS-CoV-2 infection and should not be used as the sole basis for treatment or other patient management decisions.  A negative result may occur with improper specimen collection / handling, submission of specimen other than nasopharyngeal swab, presence of viral mutation(s) within the areas  targeted by this assay, and inadequate number of viral copies (<250 copies / mL). A negative result must be combined with clinical observations, patient history, and epidemiological information.  Fact Sheet for Patients:   StrictlyIdeas.no  Fact Sheet for Healthcare Providers: BankingDealers.co.za  This test is not yet approved or  cleared by the [place] FDA and has been authorized for detection and/or diagnosis of SARS-CoV-2 by FDA under an Emergency Use Authorization (EUA).  This EUA will remain in effect (meaning this test can be used) for the duration of the COVID-19 declaration under Section 564(b)(1) of the Act, 21 U.S.C. section 360bbb-3(b)(1), unless the authorization is terminated or revoked sooner.  Performed at [facility], [address], [place], [place] [address]   Urine culture     Status: Abnormal   Collection Time: 06/18/20  5:55 AM   Specimen: In/Out Cath Urine  Result Value Ref Range Status   Specimen Description   Final    IN/OUT CATH URINE Performed at [facility], [address], [place], [place] [address]    Special Requests   Final    NONE Performed at [facility], [address] [place], [place] [address]    Culture MULTIPLE SPECIES PRESENT, SUGGEST RECOLLECTION (A)  Final   Report Status 06/19/2020 FINAL  Final     Total time spend on discharging this patient, including the last patient exam, discussing the hospital stay, instructions for ongoing care as it relates to all pertinent caregivers, as well as preparing the medical discharge records, prescriptions, and/or referrals as applicable, is 35 minutes.    [name] Bi, MD  [facility] 06/20/2020, 8:39 AM  If 7PM-7AM, please contact night-coverage

## 2020-06-23 LAB — CULTURE, BLOOD (ROUTINE X 2)
Culture: NO GROWTH
Culture: NO GROWTH
Special Requests: ADEQUATE
Special Requests: ADEQUATE

## 2020-06-25 DIAGNOSIS — Z6825 Body mass index (BMI) 25.0-25.9, adult: Secondary | ICD-10-CM | POA: Diagnosis not present

## 2020-06-25 DIAGNOSIS — M25562 Pain in left knee: Secondary | ICD-10-CM | POA: Diagnosis not present

## 2020-06-25 DIAGNOSIS — K56609 Unspecified intestinal obstruction, unspecified as to partial versus complete obstruction: Secondary | ICD-10-CM | POA: Diagnosis not present

## 2020-06-25 DIAGNOSIS — R296 Repeated falls: Secondary | ICD-10-CM | POA: Diagnosis not present

## 2020-06-25 DIAGNOSIS — I10 Essential (primary) hypertension: Secondary | ICD-10-CM | POA: Diagnosis not present

## 2020-06-25 DIAGNOSIS — D509 Iron deficiency anemia, unspecified: Secondary | ICD-10-CM | POA: Diagnosis not present

## 2020-06-25 DIAGNOSIS — J189 Pneumonia, unspecified organism: Secondary | ICD-10-CM | POA: Diagnosis not present

## 2020-06-25 DIAGNOSIS — N400 Enlarged prostate without lower urinary tract symptoms: Secondary | ICD-10-CM | POA: Diagnosis not present

## 2020-07-02 ENCOUNTER — Encounter: Payer: PPO | Attending: Physician Assistant | Admitting: Physician Assistant

## 2020-07-02 ENCOUNTER — Other Ambulatory Visit: Payer: Self-pay

## 2020-07-02 DIAGNOSIS — L97822 Non-pressure chronic ulcer of other part of left lower leg with fat layer exposed: Secondary | ICD-10-CM | POA: Insufficient documentation

## 2020-07-02 DIAGNOSIS — Z8673 Personal history of transient ischemic attack (TIA), and cerebral infarction without residual deficits: Secondary | ICD-10-CM | POA: Diagnosis not present

## 2020-07-02 DIAGNOSIS — N183 Chronic kidney disease, stage 3 unspecified: Secondary | ICD-10-CM | POA: Insufficient documentation

## 2020-07-02 DIAGNOSIS — I87332 Chronic venous hypertension (idiopathic) with ulcer and inflammation of left lower extremity: Secondary | ICD-10-CM | POA: Insufficient documentation

## 2020-07-02 DIAGNOSIS — I129 Hypertensive chronic kidney disease with stage 1 through stage 4 chronic kidney disease, or unspecified chronic kidney disease: Secondary | ICD-10-CM | POA: Insufficient documentation

## 2020-07-02 DIAGNOSIS — I70213 Atherosclerosis of native arteries of extremities with intermittent claudication, bilateral legs: Secondary | ICD-10-CM | POA: Diagnosis not present

## 2020-07-02 DIAGNOSIS — D693 Immune thrombocytopenic purpura: Secondary | ICD-10-CM | POA: Diagnosis not present

## 2020-07-02 NOTE — Progress Notes (Signed)
JOANTHAN, [name] ([id number]) Visit Report for 07/02/2020 Abuse/Suicide Risk Screen Details Patient Name: [name], [name]. Date of Service: 07/02/2020 12:45 PM Medical Record Number: [id number] Patient Account Number: [id number] Date of Birth/Sex: [birth year] (86 y.o. M) Treating RN: Grover Canavan Primary Care Thetis Schwimmer: Lavera Guise Other Clinician: Referring Shalika Arntz: Lavera Guise Treating Delitha Elms/Extender: Melburn Hake, HOYT Weeks in Treatment: 0 Abuse/Suicide Risk Screen Items Answer ABUSE RISK SCREEN: Has anyone close to you tried to hurt or harm you recentlyo No Do you feel uncomfortable with anyone in your familyo No Has anyone forced you do things that you didnot want to doo No Electronic Signature(s) Signed: 07/02/2020 5:05:31 PM By: Grover Canavan Entered By: Grover Canavan on 07/02/2020 13:32:03 Grassel, [name] ([id number]) -------------------------------------------------------------------------------- Activities of Daily Living Details Patient Name: [name], [name] Date of Service: 07/02/2020 12:45 PM Medical Record Number: [id number] Patient Account Number: [id number] Date of Birth/Sex: [birth year] (86 y.o. M) Treating RN: Grover Canavan Primary Care Currie Dennin: Lavera Guise Other Clinician: Referring Madysyn Hanken: Lavera Guise Treating Reid Nawrot/Extender: Melburn Hake, HOYT Weeks in Treatment: 0 Activities of Daily Living Items Answer Activities of Daily Living (Please select one for each item) Drive Automobile Need Assistance Take Medications Need Assistance Use Telephone Completely Able Care for Appearance Need Assistance Use Toilet Need Assistance Bath / Shower Need Assistance Dress Self Need Assistance Feed Self Completely Able Walk Need Assistance Get In / Out Bed Completely [place] Need Assistance Shop for Self Need Assistance Electronic Signature(s) Signed: 07/02/2020 5:05:31 PM By: Grover Canavan Entered By: Grover Canavan on 07/02/2020 13:32:11 Mizell, [name] ([id number]) -------------------------------------------------------------------------------- Education Screening Details Patient Name: [name] Mole B. Date of Service: 07/02/2020 12:45 PM Medical Record Number: [id number] Patient Account Number: [id number] Date of Birth/Sex: [birth year] (86 y.o. M) Treating RN: Grover Canavan Primary Care Kaylany Tesoriero: Lavera Guise Other Clinician: Referring Sahory Nordling: Lavera Guise Treating Brysyn Brandenberger/Extender: Sharalyn Ink in Treatment: 0 Learning Preferences/Education Level/Primary Language Learning Preference: Explanation, Demonstration, Printed Material Highest Education Level: High School Preferred Language: English Cognitive Barrier Language Barrier: No Translator Needed: No Memory Deficit: No Emotional Barrier: No Cultural/Religious Beliefs Affecting Medical Care: No Physical Barrier Impaired Vision: No Impaired Hearing: No Decreased Hand dexterity: No Knowledge/Comprehension Knowledge Level: Medium Comprehension Level: Medium Ability to understand written instructions: Medium Ability to understand verbal instructions: Medium Motivation Anxiety Level: Calm Cooperation: Cooperative Education Importance: Acknowledges Need Interest in Health Problems: Asks Questions Perception: Coherent Willingness to Engage in Self-Management High Activities: Readiness to Engage in Self-Management High Activities: Electronic Signature(s) Signed: 07/02/2020 5:05:31 PM By: Grover Canavan Entered By: Grover Canavan on 07/02/2020 13:32:14 Stofko, [name] ([id number]) -------------------------------------------------------------------------------- Fall Risk Assessment Details Patient Name: [name] Mole B. Date of Service: 07/02/2020 12:45 PM Medical Record Number: [id number] Patient Account Number: [id number] Date of Birth/Sex: 1931/12/19 (86 y.o. M) Treating RN: Grover Canavan Primary Care Danford Tat: Lavera Guise Other Clinician: Referring Tanyah Debruyne: Lavera Guise Treating Yuval Rubens/Extender: Melburn Hake, HOYT Weeks in Treatment: 0 Fall Risk Assessment Items Have you had 2 or more falls in the last 12 monthso 0 Yes Have you had any fall that resulted in injury in the last 12 monthso 0 No FALLS RISK SCREEN History of falling - immediate or within 3 months 25 Yes Secondary diagnosis (Do you have 2 or more medical diagnoseso) 15 Yes Ambulatory aid None/bed rest/wheelchair/nurse 0 No Crutches/cane/walker 15 Yes Furniture 0 No Intravenous therapy Access/Saline/Heparin Lock 0 No Gait/Transferring Normal/ bed rest/ wheelchair 0 No Weak (short steps with or  without shuffle, stooped but able to lift head while walking, may 0 No seek support from furniture) Impaired (short steps with shuffle, may have difficulty arising from chair, head down, impaired 0 No balance) Mental Status Oriented to own ability 0 No Electronic Signature(s) Signed: 07/02/2020 5:05:31 PM By: Grover Canavan Entered By: Grover Canavan on 07/02/2020 13:32:18 Sammarco, [name] ([id number]) -------------------------------------------------------------------------------- Foot Assessment Details Patient Name: [name] Mole B. Date of Service: 07/02/2020 12:45 PM Medical Record Number: [id number] Patient Account Number: [id number] Date of Birth/Sex: December 11, 1931 (86 y.o. M) Treating RN: Grover Canavan Primary Care Omesha Bowerman: Lavera Guise Other Clinician: Referring Zale Marcotte: Lavera Guise Treating Toribio Seiber/Extender: Melburn Hake, HOYT Weeks in Treatment: 0 Foot Assessment Items Site Locations + = Sensation present, - = Sensation absent, C = Callus, U = Ulcer R = Redness, W = Warmth, M = Maceration, PU = Pre-ulcerative lesion F = Fissure, S = Swelling, D = Dryness Assessment Right: Left: Other Deformity: No No Prior Foot Ulcer: No No Prior Amputation: No No Charcot Joint: No No Ambulatory  Status: Gait: Electronic Signature(s) Signed: 07/02/2020 5:05:31 PM By: Grover Canavan Entered By: Grover Canavan on 07/02/2020 13:32:27 Markson, [name] ([id number]) -------------------------------------------------------------------------------- Nutrition Risk Screening Details Patient Name: Pettingill, Tra B. Date of Service: 07/02/2020 12:45 PM Medical Record Number: [id number] Patient Account Number: [id number] Date of Birth/Sex: August 06, 1932 (86 y.o. M) Treating RN: Grover Canavan Primary Care Saniyya Gau: Lavera Guise Other Clinician: Referring Lillyrose Reitan: Lavera Guise Treating Sarahlynn Cisnero/Extender: Melburn Hake, HOYT Weeks in Treatment: 0 Height (in): 70 Weight (lbs): 179 Body Mass Index (BMI): 25.7 Nutrition Risk Screening Items Score Screening NUTRITION RISK SCREEN: I have an illness or condition that made me change the kind and/or amount of food I eat 0 No I eat fewer than two meals per day 0 No I eat few fruits and vegetables, or milk products 0 No I have three or more drinks of beer, liquor or wine almost every day 0 No I have tooth or mouth problems that make it hard for me to eat 0 No I don't always have enough money to buy the food I need 0 No I eat alone most of the time 0 No I take three or more different prescribed or over-the-counter drugs a day 1 Yes Without wanting to, I have lost or gained 10 pounds in the last six months 0 No I am not always physically able to shop, cook and/or feed myself 0 No Nutrition Protocols Good Risk Protocol 0 No interventions needed Moderate Risk Protocol High Risk Proctocol Risk Level: Good Risk Score: 1 Electronic Signature(s) Signed: 07/02/2020 5:05:31 PM By: Grover Canavan Entered By: Grover Canavan on 07/02/2020 13:32:22

## 2020-07-07 NOTE — Progress Notes (Signed)
ZAYLON, BOSSIER ([id number]) Visit Report for 07/02/2020 Allergy List Details Patient Name: [name], [name]. Date of Service: 07/02/2020 12:45 PM Medical Record Number: [id number] Patient Account Number: [id number] Date of Birth/Sex: 1932/08/08 (86 y.o. M) Treating RN: Grover Canavan Primary Care Titilayo Hagans: Lavera Guise Other Clinician: Referring Genise Strack: Lavera Guise Treating Naftula Donahue/Extender: Melburn Hake, HOYT Weeks in Treatment: 0 Allergies Active Allergies morphine Reaction: hives Allergy Notes Electronic Signature(s) Signed: 07/02/2020 5:05:31 PM By: Grover Canavan Entered By: Grover Canavan on 07/02/2020 13:31:51 [name], [name] ([id number]) -------------------------------------------------------------------------------- Arrival Information Details Patient Name: [name], [name] Date of Service: 07/02/2020 12:45 PM Medical Record Number: [id number] Patient Account Number: [id number] Date of Birth/Sex: [birth year] (86 y.o. M) Treating RN: Grover Canavan Primary Care Synai Prettyman: Lavera Guise Other Clinician: Referring Dorette Hartel: Lavera Guise Treating Evamae Rowen/Extender: Melburn Hake, HOYT Weeks in Treatment: 0 Visit Information Patient Arrived: Walker Arrival Time: 12:52 Accompanied By: granddaughter Transfer Assistance: None Patient Identification Verified: Yes Secondary Verification Process Completed: Yes Patient Has Alerts: Yes Patient Alerts: 03/16/20 ABI R 1.15, L 1.35 History Since Last Visit Added or deleted any medications: No Had a fall or experienced change in activities of daily living that may affect risk of falls: No Hospitalized since last visit: No Electronic Signature(s) Signed: 07/02/2020 5:05:31 PM By: Grover Canavan Entered By: Grover Canavan on 07/02/2020 13:20:26 [name], [name] ([id number]) -------------------------------------------------------------------------------- Clinic Level of Care Assessment Details Patient Name: [name] Mole B. Date of  Service: 07/02/2020 12:45 PM Medical Record Number: [id number] Patient Account Number: [id number] Date of Birth/Sex: 24-Apr-1932 (86 y.o. M) Treating RN: Cornell Barman Primary Care Arend Bahl: Lavera Guise Other Clinician: Referring Danaija Eskridge: Lavera Guise Treating Jes Costales/Extender: Melburn Hake, HOYT Weeks in Treatment: 0 Clinic Level of Care Assessment Items TOOL 1 Quantity Score []  - Use when EandM and Procedure is performed on INITIAL visit 0 ASSESSMENTS - Nursing Assessment / Reassessment X - General Physical Exam (combine w/ comprehensive assessment (listed just below) when performed on new 1 20 pt. evals) X- 1 25 Comprehensive Assessment (HX, ROS, Risk Assessments, Wounds Hx, etc.) ASSESSMENTS - Wound and Skin Assessment / Reassessment []  - Dermatologic / Skin Assessment (not related to wound area) 0 ASSESSMENTS - Ostomy and/or Continence Assessment and Care []  - Incontinence Assessment and Management 0 []  - 0 Ostomy Care Assessment and Management (repouching, etc.) PROCESS - Coordination of Care X - Simple Patient / Family Education for ongoing care 1 15 []  - 0 Complex (extensive) Patient / Family Education for ongoing care []  - 0 Staff obtains Programmer, systems, Records, Test Results / Process Orders []  - 0 Staff telephones HHA, Nursing Homes / Clarify orders / etc []  - 0 Routine Transfer to another Facility (non-emergent condition) []  - 0 Routine Hospital Admission (non-emergent condition) X- 1 15 New Admissions / Biomedical engineer / Ordering NPWT, Apligraf, etc. []  - 0 Emergency Hospital Admission (emergent condition) PROCESS - Special Needs []  - Pediatric / Minor Patient Management 0 []  - 0 Isolation Patient Management []  - 0 Hearing / Language / Visual special needs []  - 0 Assessment of Community assistance (transportation, D/C planning, etc.) []  - 0 Additional assistance / Altered mentation []  - 0 Support Surface(s) Assessment (bed, cushion, seat, etc.) INTERVENTIONS  - Miscellaneous []  - External ear exam 0 []  - 0 Patient Transfer (multiple staff / Civil Service fast streamer / Similar devices) []  - 0 Simple Staple / Suture removal (25 or less) []  - 0 Complex Staple / Suture removal (26 or more) []  - 0 Hypo/Hyperglycemic Management (do not check if billed  separately) []  - 0 Ankle / Brachial Index (ABI) - do not check if billed separately Has the patient been seen at the hospital within the last three years: Yes Total Score: 75 Level Of Care: New/Established - Level 2 [name], [name] ([id number]) Electronic Signature(s) Signed: 07/06/2020 7:43:28 PM By: Gretta Cool, BSN, RN, CWS, Kim RN, BSN Entered By: Gretta Cool, BSN, RN, CWS, Kim on 07/02/2020 13:42:51 [name], [name] ([id number]) -------------------------------------------------------------------------------- Compression Therapy Details Patient Name: [name] Mole B. Date of Service: 07/02/2020 12:45 PM Medical Record Number: [id number] Patient Account Number: [id number] Date of Birth/Sex: [birth year] (86 y.o. M) Treating RN: Cornell Barman Primary Care Verleen Stuckey: Lavera Guise Other Clinician: Referring Kelleen Stolze: Lavera Guise Treating Aarya Quebedeaux/Extender: Melburn Hake, HOYT Weeks in Treatment: 0 Compression Therapy Performed for Wound Assessment: Wound #4 Left,Distal,Lateral Lower Leg Performed By: Clinician Cornell Barman, RN Compression Type: Three Layer Pre Treatment ABI: 1.4 Post Procedure Diagnosis Same as Pre-procedure Electronic Signature(s) Signed: 07/06/2020 7:43:28 PM By: Gretta Cool, BSN, RN, CWS, Kim RN, BSN Entered By: Gretta Cool, BSN, RN, CWS, Kim on 07/02/2020 13:40:06 [name], [name] ([id number]) -------------------------------------------------------------------------------- Encounter Discharge Information Details Patient Name: [name], [name] Date of Service: 07/02/2020 12:45 PM Medical Record Number: [id number] Patient Account Number: [id number] Date of Birth/Sex: 1932/03/29 (86 y.o. M) Treating RN: Cornell Barman Primary Care  Erinn Huskins: Lavera Guise Other Clinician: Referring Prophet Renwick: Lavera Guise Treating Kayla Deshaies/Extender: Melburn Hake, HOYT Weeks in Treatment: 0 Encounter Discharge Information Items Discharge Condition: Stable Ambulatory Status: Ambulatory Discharge Destination: Home Transportation: Private Auto Accompanied By: self Schedule Follow-up Appointment: Yes Clinical Summary of Care: Electronic Signature(s) Signed: 07/06/2020 7:43:28 PM By: Gretta Cool, BSN, RN, CWS, Kim RN, BSN Entered By: Gretta Cool, BSN, RN, CWS, Kim on 07/02/2020 13:44:10 [name], [name] ([id number]) -------------------------------------------------------------------------------- Lower Extremity Assessment Details Patient Name: [name], [name] Date of Service: 07/02/2020 12:45 PM Medical Record Number: [id number] Patient Account Number: [id number] Date of Birth/Sex: 1932-04-04 (86 y.o. M) Treating RN: Grover Canavan Primary Care Tacy Chavis: Lavera Guise Other Clinician: Referring Keyna Blizard: Lavera Guise Treating Laken Lobato/Extender: Melburn Hake, HOYT Weeks in Treatment: 0 Edema Assessment Assessed: [Left: No] [Right: No] Edema: [Left: Yes] [Right: Yes] Calf Left: Right: Point of Measurement: 29 cm From Medial Instep 38 cm 39.5 cm Ankle Left: Right: Point of Measurement: 9 cm From Medial Instep 27 cm 28.5 cm Vascular Assessment Pulses: Dorsalis Pedis Palpable: [Left:No] [Right:No] Doppler Audible: [Left:Yes] [Right:Yes] Posterior Tibial Palpable: [Left:No Yes] [Right:No Yes] Electronic Signature(s) Signed: 07/02/2020 5:05:31 PM By: Grover Canavan Entered By: Grover Canavan on 07/02/2020 13:31:47 Lotspeich, [name] ([id number]) -------------------------------------------------------------------------------- Multi Wound Chart Details Patient Name: [name] Mole B. Date of Service: 07/02/2020 12:45 PM Medical Record Number: [id number] Patient Account Number: [id number] Date of Birth/Sex: 1932/05/18 (86 y.o. M) Treating RN: Cornell Barman Primary Care Digby Groeneveld: Lavera Guise Other Clinician: Referring Iain Sawchuk: Lavera Guise Treating Aysiah Jurado/Extender: Melburn Hake, HOYT Weeks in Treatment: 0 Vital Signs Height(in): 70 Pulse(bpm): 63 Weight(lbs): 179 Blood Pressure(mmHg): 137/63 Body Mass Index(BMI): 26 Temperature(F): 98.2 Respiratory Rate(breaths/min): 16 Photos: [N/A:N/A] Wound Location: Left, Distal, Lateral Lower Leg Left, Posterior Lower Leg N/A Wounding Event: Gradually Appeared Trauma N/A Primary Etiology: Trauma, Other Trauma, Other N/A Comorbid History: Hypertension, End Stage Renal Hypertension, End Stage Renal N/A Disease, Osteoarthritis, Seizure Disease, Osteoarthritis, Seizure Disorder Disorder Date Acquired: 02/02/2020 07/02/2020 N/A Weeks of Treatment: 0 0 N/A Wound Status: Open Open N/A Measurements L x W x D (cm) 1.1x0.5x0.1 2x2x0.2 N/A Area (cm) : 0.432 3.142 N/A Volume (cm) : 0.043 0.628 N/A % Reduction in Area: 0.00% 0.00% N/A %  Reduction in Volume: 0.00% 0.00% N/A Classification: Partial Thickness Partial Thickness N/A Exudate Amount: Medium Medium N/A Exudate Type: Serosanguineous Serous N/A Exudate Color: red, brown amber N/A Wound Margin: Flat and Intact Distinct, outline attached N/A Granulation Amount: None Present (0%) None Present (0%) N/A Necrotic Amount: Large (67-100%) None Present (0%) N/A Exposed Structures: Fat Layer (Subcutaneous Tissue): Fat Layer (Subcutaneous Tissue): N/A Yes Yes Fascia: No Fascia: No Tendon: No Tendon: No Muscle: No Muscle: No Joint: No Joint: No Bone: No Bone: No Epithelialization: None None N/A Treatment Notes Electronic Signature(s) Signed: 07/06/2020 7:43:28 PM By: Gretta Cool, BSN, RN, CWS, Kim RN, BSN Entered By: Gretta Cool, BSN, RN, CWS, Kim on 07/02/2020 13:39:22 Gadsby, [name] ([id number]) -------------------------------------------------------------------------------- Multi-Disciplinary Care Plan Details Patient Name: [name], [name] Date  of Service: 07/02/2020 12:45 PM Medical Record Number: [id number] Patient Account Number: [id number] Date of Birth/Sex: December 21, 1931 (86 y.o. M) Treating RN: Cornell Barman Primary Care Caroline Longie: Lavera Guise Other Clinician: Referring Charne Mcbrien: Lavera Guise Treating Virga Haltiwanger/Extender: Melburn Hake, HOYT Weeks in Treatment: 0 Active Inactive Orientation to the Wound Care Program Nursing Diagnoses: Knowledge deficit related to the wound healing center program Goals: Patient/caregiver will verbalize understanding of the [facility] Program Date Initiated: 07/02/2020 Target Resolution Date: 07/02/2020 Goal Status: Active Interventions: Provide education on orientation to the wound center Notes: Venous Leg Ulcer Nursing Diagnoses: Actual venous Insuffiency (use after diagnosis is confirmed) Goals: Patient will maintain optimal edema control Date Initiated: 07/02/2020 Target Resolution Date: 07/09/2020 Goal Status: Active Interventions: Assess peripheral edema status every visit. Compression as ordered Treatment Activities: Therapeutic compression applied : 07/02/2020 Notes: Wound/Skin Impairment Nursing Diagnoses: Impaired tissue integrity Goals: Ulcer/skin breakdown will have a volume reduction of 30% by week 4 Date Initiated: 07/02/2020 Target Resolution Date: 08/02/2020 Goal Status: Active Interventions: Assess ulceration(s) every visit Treatment Activities: Skin care regimen initiated : 07/02/2020 Notes: Electronic Signature(s) Signed: 07/06/2020 7:43:28 PM By: Gretta Cool, BSN, RN, CWS, Kim RN, BSN Wagar, Harwood BMarland Kitchen ([id number]) Entered By: Gretta Cool, BSN, RN, CWS, Kim on 07/02/2020 13:38:52 Din, [name] ([id number]) -------------------------------------------------------------------------------- Pain Assessment Details Patient Name: GUSTABO, GORDILLO B. Date of Service: 07/02/2020 12:45 PM Medical Record Number: [id number] Patient Account Number: [id number] Date of Birth/Sex: 1932-04-01 (86  y.o. M) Treating RN: Grover Canavan Primary Care Kenzey Birkland: Lavera Guise Other Clinician: Referring Colen Eltzroth: Lavera Guise Treating Tyrell Brereton/Extender: Melburn Hake, HOYT Weeks in Treatment: 0 Active Problems Location of Pain Severity and Description of Pain Patient Has Paino No Site Locations Pain Management and Medication Current Pain Management: Electronic Signature(s) Signed: 07/02/2020 5:05:31 PM By: Grover Canavan Entered By: Grover Canavan on 07/02/2020 12:57:25 [name], [name] ([id number]) -------------------------------------------------------------------------------- Patient/Caregiver Education Details Patient Name: [name] Mole B. Date of Service: 07/02/2020 12:45 PM Medical Record Number: [id number] Patient Account Number: [id number] Date of Birth/Gender: May 09, 1932 (86 y.o. M) Treating RN: Cornell Barman Primary Care Physician: Lavera Guise Other Clinician: Referring Physician: Lavera Guise Treating Physician/Extender: Sharalyn Ink in Treatment: 0 Education Assessment Education Provided To: Patient Education Topics Provided Venous: Handouts: Controlling Swelling with Multilayered Compression Wraps Methods: Demonstration, Explain/Verbal Responses: State content correctly Electronic Signature(s) Signed: 07/06/2020 7:43:28 PM By: Gretta Cool, BSN, RN, CWS, Kim RN, BSN Entered By: Gretta Cool, BSN, RN, CWS, Kim on 07/02/2020 13:43:20 Rude, [name] ([id number]) -------------------------------------------------------------------------------- Wound Assessment Details Patient Name: [name], [name] Date of Service: 07/02/2020 12:45 PM Medical Record Number: [id number] Patient Account Number: [id number] Date of Birth/Sex: Nov 23, 1931 (86 y.o. M) Treating RN: Cornell Barman Primary Care Quintavis Brands: Lavera Guise Other Clinician: Referring Taneah Masri: Lavera Guise  Treating Cylah Fannin/Extender: STONE III, HOYT Weeks in Treatment: 0 Wound Status Wound Number: 4 Primary Trauma,  Other Etiology: Wound Location: Left, Distal, Lateral Lower Leg Wound Status: Open Wounding Event: Gradually Appeared Comorbid Hypertension, End Stage Renal Disease, Osteoarthritis, Date Acquired: 02/02/2020 History: Seizure Disorder Weeks Of Treatment: 0 Clustered Wound: No Photos Wound Measurements Length: (cm) 0.1 Width: (cm) 0.1 Depth: (cm) 0.1 Area: (cm) 0.008 Volume: (cm) 0.001 % Reduction in Area: 98.1% % Reduction in Volume: 97.7% Epithelialization: None Tunneling: No Undermining: No Wound Description Classification: Partial Thickness Wound Margin: Flat and Intact Exudate Amount: Medium Exudate Type: Serosanguineous Exudate Color: red, brown Foul Odor After Cleansing: No Slough/Fibrino Yes Wound Bed Granulation Amount: None Present (0%) Exposed Structure Necrotic Amount: Large (67-100%) Fascia Exposed: No Necrotic Quality: Adherent Slough Fat Layer (Subcutaneous Tissue) Exposed: Yes Tendon Exposed: No Muscle Exposed: No Joint Exposed: No Bone Exposed: No Treatment Notes Wound #4 (Left, Distal, Lateral Lower Leg) Notes 3 layer wrap Electronic Signature(s) Signed: 07/06/2020 7:43:28 PM By: Gretta Cool, BSN, RN, CWS, Kim RN, BSN Calabrese, [place] BMarland Kitchen ([id number]) Entered By: Gretta Cool, BSN, RN, CWS, Kim on 07/02/2020 13:42:14 Wedemeyer, [name] ([id number]) -------------------------------------------------------------------------------- Wound Assessment Details Patient Name: JACKSON, COFFIELD B. Date of Service: 07/02/2020 12:45 PM Medical Record Number: [id number] Patient Account Number: [id number] Date of Birth/Sex: [birth year] (86 y.o. M) Treating RN: Grover Canavan Primary Care Madox Corkins: Lavera Guise Other Clinician: Referring Catheryn Slifer: Lavera Guise Treating Wilian Kwong/Extender: Melburn Hake, HOYT Weeks in Treatment: 0 Wound Status Wound Number: 5 Primary Trauma, Other Etiology: Wound Location: Left, Posterior Lower Leg Wound Status: Open Wounding Event: Trauma Comorbid  Hypertension, End Stage Renal Disease, Osteoarthritis, Date Acquired: 07/02/2020 History: Seizure Disorder Weeks Of Treatment: 0 Clustered Wound: No Photos Wound Measurements Length: (cm) 2 % R Width: (cm) 2 % R Depth: (cm) 0.2 Epi Area: (cm) 3.142 Tu Volume: (cm) 0.628 Un eduction in Area: 0% eduction in Volume: 0% thelialization: None nneling: No dermining: No Wound Description Classification: Partial Thickness Fo Wound Margin: Distinct, outline attached Sl Exudate Amount: Medium Exudate Type: Serous Exudate Color: amber ul Odor After Cleansing: No ough/Fibrino No Wound Bed Granulation Amount: None Present (0%) Exposed Structure Necrotic Amount: None Present (0%) Fascia Exposed: No Fat Layer (Subcutaneous Tissue) Exposed: Yes Tendon Exposed: No Muscle Exposed: No Joint Exposed: No Bone Exposed: No Treatment Notes Wound #5 (Left, Posterior Lower Leg) Notes 3 layer wrap Electronic Signature(s) Signed: 07/02/2020 5:05:31 PM By: Melton Alar, [name] ([id number]) Entered By: Grover Canavan on 07/02/2020 13:31:29 Peacock, [name] ([id number]) -------------------------------------------------------------------------------- [place] Details Patient Name: [name] Mole B. Date of Service: 07/02/2020 12:45 PM Medical Record Number: [id number] Patient Account Number: [id number] Date of Birth/Sex: 14-Feb-1932 (86 y.o. M) Treating RN: Grover Canavan Primary Care Shanavia Makela: Lavera Guise Other Clinician: Referring Jettie Mannor: Lavera Guise Treating Lateesha Bezold/Extender: Melburn Hake, HOYT Weeks in Treatment: 0 Vital Signs Time Taken: 12:57 Temperature (F): 98.2 Height (in): 70 Pulse (bpm): 69 Source: Stated Respiratory Rate (breaths/min): 16 Weight (lbs): 179 Blood Pressure (mmHg): 137/63 Source: Stated Reference Range: 80 - 120 mg / dl Body Mass Index (BMI): 25.7 Electronic Signature(s) Signed: 07/02/2020 5:05:31 PM By: Grover Canavan Entered By: Grover Canavan  on 07/02/2020 13:04:43

## 2020-07-07 NOTE — Progress Notes (Signed)
GUADALUPE, NICKLESS ([id number]) Visit Report for 07/02/2020 Chief Complaint Document Details Patient Name: [name], [name]. Date of Service: 07/02/2020 12:45 PM Medical Record Number: [id number] Patient Account Number: [id number] Date of Birth/Sex: [birth year] (86 y.o. M) Treating RN: Cornell Barman Primary Care Provider: Lavera Guise Other Clinician: Referring Provider: Lavera Guise Treating Provider/Extender: Melburn Hake, Zyrion Coey Weeks in Treatment: 0 Information Obtained from: Patient Chief Complaint Left LE Ulcers Electronic Signature(s) Signed: 07/02/2020 2:04:48 PM By: Worthy Keeler PA-C Entered By: Worthy Keeler on 07/02/2020 14:04:48 Turton, [name] ([id number]) -------------------------------------------------------------------------------- HPI Details Patient Name: [name] Mole B. Date of Service: 07/02/2020 12:45 PM Medical Record Number: [id number] Patient Account Number: [id number] Date of Birth/Sex: 03/17/1932 (86 y.o. M) Treating RN: Cornell Barman Primary Care Provider: Lavera Guise Other Clinician: Referring Provider: Lavera Guise Treating Provider/Extender: Melburn Hake, Laylia Mui Weeks in Treatment: 0 History of Present Illness HPI Description: ADMISSION 03/04/2020 Patient is an 86 year old man who arrives accompanied by his wife. He states that he has had wounds on his bilateral lower extremities that started with increased swelling in his lower extremities. He has quite significant bilateral lower extremity pitting edema but he states that this is only been there for 2 or 3 weeks. Says he saw his primary doctor who put him on what sounds like a diuretic. He has developed small wounds on the left lateral which is actually the most prominent wound. Very small superficial wounds on the left anterior and a cluster of very small wounds on the right lateral. These are not particularly painful although he states that his legs are itchy. on arrival in the clinic our intake nurse could not obtain  pulses bilaterally. She could not maintain of enough of a Doppler in the right dorsalis pedis to get an ABI and on the left she could not even obtain it by Doppler. The patient does have pain in his legs when he walks. He says 10 to 15 minutes he has to sit down and rest although he thought that this was because of his knees. He also mentions that his wounds are painful at night when he is lying in bed sometimes waking him up at 5:00 in the morning. Past medical history includes hypertension, CVA in 2011 with no residual effects, osteoarthritis of both knees, recent seizure disorder but apparently complete work-up was negative, ITP, vitamin B12 deficiency and stage III chronic renal failure We could not obtain ABIs in our clinic as noted above 03/11/2020 upon evaluation today patient actually appears to be doing much better in regard to his bilateral lower extremities. Fortunately there is no signs of infection. He actually saw Dr. Dellia Nims during his initial visit last week and he was treated very appropriately his wounds appear to be doing much better. Also don't have the complete evaluation as far as his arterial study is concerned that the vascular doctor has not read it yet. With that being said it did appear that the patient's findings were consistent with being okay. His TBI's were somewhat low at 0.39 on the right and 0.49 on the left with an ABI of 1.15 on the right and 1.35 on the left. With that being said I think that the fact that he is healing and has tolerated the wrap very well at this point is a good indication that he is good to do just fine. 03/18/2020 upon evaluation today patient appears to be doing pretty well in regard to his leg ulcer on the left. Fortunately there is no signs  of active infection at this time. No fevers, chills, nausea, vomiting, or diarrhea. I am actually rather pleased with how things seem to be progressing. Overall the patient's wound I think is ready for  collagen I do not believe there is enough drainage to warrant having to continue with the alginate at this point. 5/13; small wound on the left lateral lower leg. We are using silver collagen. Not much improvement this week in surface area or condition of the wound. Wrapping and kerlix Coban. Previous ABI was noncompressible 5/20; the patient's wound on the left lateral lower leg is smaller. We have been using silver collagen. I increased him from 2-3 layer compressions last week. Edema control is better Since he was last here he tells me he was in the [facility] for [facility] hours with what sounds like a TIA. I have not checked these records 04/09/2020 on evaluation today patient appears to be doing well with regard to his wound. This is measuring significantly better and seems to be doing excellent. The collagen I think is still the best option for him at this point. Fortunately there is no evidence of active infection at this time. 04/16/2020 upon evaluation today patient appears to be doing excellent in regard to his lower extremity ulcer. In fact this is measuring quite a bit smaller and overall I feel like he is doing very well in that regard. There is no signs of active infection at this time. No fevers, chills, nausea, vomiting, or diarrhea. I think he is very close to complete resolution. 04/22/2020 upon evaluation today patient actually appears to be doing excellent in regard to his leg ulcer today. In fact this appears to be completely healed based on what I am seeing. I may want to put a protective dressing over it for just a couple of days just to ensure that nothing drains or causes him any issues. Readmission: 07/02/2020 upon evaluation today patient presents for reevaluation here in our clinic concerning issues that he has been having with his left lower extremity. I did review lab work from about a week ago which showed that at that time his white blood count was at 3.8 and somewhat  elevated. Right now I do not see any signs of active infection at this time which is great news. I also did review his arterial studies which were dated 03/09/2020. This showed that he did have no evidence of right or left severe peripheral vascular disease. Obviously this is good his blood flow is not perfect but nonetheless is also not severe. I do believe that he would tolerate a mild compression wrap without any complication. He tells me he also scratched the side of his leg last night. But that the area that I previously helped him heal seems to still be doing well. I did have a look at that very closely today that we detailed in the physical exam. Fortunately there is no signs of active infection at this time which is great news. No fevers, chills, nausea, vomiting, or diarrhea. Electronic Signature(s) Signed: 07/02/2020 2:05:28 PM By: Worthy Keeler PA-C Entered By: Worthy Keeler on 07/02/2020 [address] ANCEL EASLER ([id number], HARKLEROAD ([id number]) -------------------------------------------------------------------------------- Physical Exam Details Patient Name: [name], [name] Date of Service: 07/02/2020 12:45 PM Medical Record Number: [id number] Patient Account Number: [id number] Date of Birth/Sex: 1932-09-03 (86 y.o. M) Treating RN: Cornell Barman Primary Care Provider: Lavera Guise Other Clinician: Referring Provider: Lavera Guise Treating Provider/Extender: Melburn Hake, Wendy Mikles Weeks in Treatment:  0 Constitutional sitting or standing blood pressure is within target range for patient.. pulse regular and within target range for patient.Marland Kitchen respirations regular, non- labored and within target range for patient.Marland Kitchen temperature within target range for patient.. Well-nourished and well-hydrated in no acute distress. Eyes conjunctiva clear no eyelid edema noted. pupils equal round and reactive to light and accommodation. Ears, Nose, Mouth, and Throat no gross abnormality of ear auricles  or external auditory canals. normal hearing noted during conversation. mucus membranes moist. Respiratory normal breathing without difficulty. Cardiovascular 1+ dorsalis pedis/posterior tibialis pulses. 1+ pitting edema of the bilateral lower extremities. Musculoskeletal normal gait and posture. no significant deformity or arthritic changes, no loss or range of motion, no clubbing. Psychiatric this patient is able to make decisions and demonstrates good insight into disease process. Alert and Oriented x 3. pleasant and cooperative. Notes Upon inspection patient actually appears to be doing quite well at this time with regard to his wounds. In fact the previous ulcer has just some weeping around the edges it actually appears to have new skin pretty much covering. There is some areas that he scratched that I think are giving him more trouble at this point he is having a lot of itching that is probably due to the swelling that he has in fact this is not under good control he tells me he cannot get his compression stockings on. Electronic Signature(s) Signed: 07/02/2020 2:07:19 PM By: Worthy Keeler PA-C Entered By: Worthy Keeler on 07/02/2020 14:07:19 Renk, [name] ([id number]) -------------------------------------------------------------------------------- Physician Orders Details Patient Name: TYMIR, TERRAL B. Date of Service: 07/02/2020 12:45 PM Medical Record Number: [id number] Patient Account Number: [id number] Date of Birth/Sex: [birth year] (86 y.o. M) Treating RN: Cornell Barman Primary Care Provider: Lavera Guise Other Clinician: Referring Provider: Lavera Guise Treating Provider/Extender: Melburn Hake, Deb Loudin Weeks in Treatment: 0 Verbal / Phone Orders: No Diagnosis Coding ICD-10 Coding Code Description I87.332 Chronic venous hypertension (idiopathic) with ulcer and inflammation of left lower extremity L97.822 Non-pressure chronic ulcer of other part of left lower leg with fat layer  exposed I70.213 Atherosclerosis of native arteries of extremities with intermittent claudication, bilateral legs Wound Cleansing Wound #4 Left,Distal,Lateral Lower Leg o Clean wound with Normal Saline. Wound #5 Left,Posterior Lower Leg o Clean wound with Normal Saline. Skin Barriers/Peri-Wound Care Wound #4 Left,Distal,Lateral Lower Leg o Triamcinolone Acetonide Ointment (TCA) Wound #5 Left,Posterior Lower Leg o Triamcinolone Acetonide Ointment (TCA) Dressing Change Frequency Wound #4 Left,Distal,Lateral Lower Leg o Change dressing every week Wound #5 Left,Posterior Lower Leg o Change dressing every week Follow-up Appointments Wound #4 Left,Distal,Lateral Lower Leg o Return Appointment in 1 week. Wound #5 Left,Posterior Lower Leg o Return Appointment in 1 week. Edema Control Wound #4 Left,Distal,Lateral Lower Leg o 3 Layer Compression System - Left Lower Extremity Wound #5 Left,Posterior Lower Leg o 3 Layer Compression System - Left Lower Extremity Electronic Signature(s) Signed: 07/02/2020 5:28:43 PM By: Worthy Keeler PA-C Signed: 07/06/2020 7:43:28 PM By: Gretta Cool, BSN, RN, CWS, Kim RN, BSN Entered By: Gretta Cool, BSN, RN, CWS, Kim on 07/02/2020 13:41:47 Raia, [name] ([id number]) -------------------------------------------------------------------------------- Problem List Details Patient Name: [name], [name] Date of Service: 07/02/2020 12:45 PM Medical Record Number: [id number] Patient Account Number: [id number] Date of Birth/Sex: [birth year] (86 y.o. M) Treating RN: Cornell Barman Primary Care Provider: Lavera Guise Other Clinician: Referring Provider: Lavera Guise Treating Provider/Extender: Melburn Hake, Deontre Allsup Weeks in Treatment: 0 Active Problems ICD-10 Encounter Code Description Active Date MDM Diagnosis I87.332 Chronic venous hypertension (idiopathic) with ulcer  and inflammation of 07/02/2020 No Yes left lower extremity L97.822 Non-pressure chronic ulcer of  other part of left lower leg with fat layer 07/02/2020 No Yes exposed I70.213 Atherosclerosis of native arteries of extremities with intermittent 07/02/2020 No Yes claudication, bilateral legs Inactive Problems Resolved Problems Electronic Signature(s) Signed: 07/02/2020 1:35:42 PM By: Worthy Keeler PA-C Entered By: Worthy Keeler on 07/02/2020 13:35:42 Grosvenor, [name] ([id number]) -------------------------------------------------------------------------------- Progress Note Details Patient Name: [name] Mole B. Date of Service: 07/02/2020 12:45 PM Medical Record Number: [id number] Patient Account Number: [id number] Date of Birth/Sex: April 27, 1932 (86 y.o. M) Treating RN: Cornell Barman Primary Care Provider: Lavera Guise Other Clinician: Referring Provider: Lavera Guise Treating Provider/Extender: Melburn Hake, Lilyian Quayle Weeks in Treatment: 0 Subjective Chief Complaint Information obtained from Patient Left LE Ulcers History of Present Illness (HPI) ADMISSION 03/04/2020 Patient is an 86 year old man who arrives accompanied by his wife. He states that he has had wounds on his bilateral lower extremities that started with increased swelling in his lower extremities. He has quite significant bilateral lower extremity pitting edema but he states that this is only been there for 2 or 3 weeks. Says he saw his primary doctor who put him on what sounds like a diuretic. He has developed small wounds on the left lateral which is actually the most prominent wound. Very small superficial wounds on the left anterior and a cluster of very small wounds on the right lateral. These are not particularly painful although he states that his legs are itchy. on arrival in the clinic our intake nurse could not obtain pulses bilaterally. She could not maintain of enough of a Doppler in the right dorsalis pedis to get an ABI and on the left she could not even obtain it by Doppler. The patient does have pain in his legs when  he walks. He says 10 to 15 minutes he has to sit down and rest although he thought that this was because of his knees. He also mentions that his wounds are painful at night when he is lying in bed sometimes waking him up at 5:00 in the morning. Past medical history includes hypertension, CVA in 2011 with no residual effects, osteoarthritis of both knees, recent seizure disorder but apparently complete work-up was negative, ITP, vitamin B12 deficiency and stage III chronic renal failure We could not obtain ABIs in our clinic as noted above 03/11/2020 upon evaluation today patient actually appears to be doing much better in regard to his bilateral lower extremities. Fortunately there is no signs of infection. He actually saw Dr. Dellia Nims during his initial visit last week and he was treated very appropriately his wounds appear to be doing much better. Also don't have the complete evaluation as far as his arterial study is concerned that the vascular doctor has not read it yet. With that being said it did appear that the patient's findings were consistent with being okay. His TBI's were somewhat low at 0.39 on the right and 0.49 on the left with an ABI of 1.15 on the right and 1.35 on the left. With that being said I think that the fact that he is healing and has tolerated the wrap very well at this point is a good indication that he is good to do just fine. 03/18/2020 upon evaluation today patient appears to be doing pretty well in regard to his leg ulcer on the left. Fortunately there is no signs of active infection at this time. No fevers, chills, nausea, vomiting, or diarrhea. I  am actually rather pleased with how things seem to be progressing. Overall the patient's wound I think is ready for collagen I do not believe there is enough drainage to warrant having to continue with the alginate at this point. 5/13; small wound on the left lateral lower leg. We are using silver collagen. Not much improvement  this week in surface area or condition of the wound. Wrapping and kerlix Coban. Previous ABI was noncompressible 5/20; the patient's wound on the left lateral lower leg is smaller. We have been using silver collagen. I increased him from 2-3 layer compressions last week. Edema control is better Since he was last here he tells me he was in the [facility] for 48 hours with what sounds like a TIA. I have not checked these records 04/09/2020 on evaluation today patient appears to be doing well with regard to his wound. This is measuring significantly better and seems to be doing excellent. The collagen I think is still the best option for him at this point. Fortunately there is no evidence of active infection at this time. 04/16/2020 upon evaluation today patient appears to be doing excellent in regard to his lower extremity ulcer. In fact this is measuring quite a bit smaller and overall I feel like he is doing very well in that regard. There is no signs of active infection at this time. No fevers, chills, nausea, vomiting, or diarrhea. I think he is very close to complete resolution. 04/22/2020 upon evaluation today patient actually appears to be doing excellent in regard to his leg ulcer today. In fact this appears to be completely healed based on what I am seeing. I may want to put a protective dressing over it for just a couple of days just to ensure that nothing drains or causes him any issues. Readmission: 07/02/2020 upon evaluation today patient presents for reevaluation here in our clinic concerning issues that he has been having with his left lower extremity. I did review lab work from about a week ago which showed that at that time his white blood count was at 3.8 and somewhat elevated. Right now I do not see any signs of active infection at this time which is great news. I also did review his arterial studies which were dated 03/09/2020. This showed that he did have no evidence of right or left  severe peripheral vascular disease. Obviously this is good his blood flow is not perfect but nonetheless is also not severe. I do believe that he would tolerate a mild compression wrap without any complication. He tells me he also scratched the side of his leg last night. But that the area that I previously helped him heal seems to still be doing well. I did have a look at that very closely today that we detailed in the physical exam. Fortunately there is no signs of active infection at this time which is great news. No fevers, chills, nausea, vomiting, or diarrhea. [name], [name] ([id number]) Patient History Information obtained from Patient. Allergies morphine (Reaction: hives) Family History Diabetes - Siblings, Heart Disease - Siblings, Hypertension - Siblings, No family history of Cancer, Hereditary Spherocytosis, Kidney Disease, Lung Disease, Seizures, Stroke, Thyroid Problems, Tuberculosis. Social History Never smoker, Marital Status - Married, Alcohol Use - Never, Drug Use - No History, Caffeine Use - Daily. Medical History Cardiovascular Patient has history of Hypertension Genitourinary Patient has history of End Stage Renal Disease - CKD stage 3 Integumentary (Skin) Denies history of History of Burn, History of  pressure wounds Musculoskeletal Patient has history of Osteoarthritis Denies history of Gout, Rheumatoid Arthritis, Osteomyelitis Neurologic Patient has history of Seizure Disorder - no medication Denies history of Dementia, Neuropathy, Quadriplegia, Paraplegia Medical And Surgical History Notes Cardiovascular HLD Neurologic hx CVA in 2011 Review of Systems (ROS) Constitutional Symptoms (General Health) Denies complaints or symptoms of Fatigue, Fever, Chills, Marked Weight Change. Objective Constitutional sitting or standing blood pressure is within target range for patient.. pulse regular and within target range for patient.Marland Kitchen respirations regular, non- labored  and within target range for patient.Marland Kitchen temperature within target range for patient.. Well-nourished and well-hydrated in no acute distress. Vitals Time Taken: 12:57 PM, Height: 70 in, Source: Stated, Weight: 179 lbs, Source: Stated, BMI: 25.7, Temperature: 98.2 F, Pulse: 69 bpm, Respiratory Rate: 16 breaths/min, Blood Pressure: 137/63 mmHg. Eyes conjunctiva clear no eyelid edema noted. pupils equal round and reactive to light and accommodation. Ears, Nose, Mouth, and Throat no gross abnormality of ear auricles or external auditory canals. normal hearing noted during conversation. mucus membranes moist. Respiratory normal breathing without difficulty. Cardiovascular 1+ dorsalis pedis/posterior tibialis pulses. 1+ pitting edema of the bilateral lower extremities. Musculoskeletal normal gait and posture. no significant deformity or arthritic changes, no loss or range of motion, no clubbing. Psychiatric this patient is able to make decisions and demonstrates good insight into disease process. Alert and Oriented x 3. pleasant and cooperative. General Notes: Upon inspection patient actually appears to be doing quite well at this time with regard to his wounds. In fact the previous ulcer has just some weeping around the edges it actually appears to have new skin pretty much covering. There is some areas that he scratched that I think are giving him more trouble at this point he is having a lot of itching that is probably due to the swelling that he has in fact this is not under [name], [name] ([id number]) good control he tells me he cannot get his compression stockings on. Integumentary (Hair, Skin) Wound #4 status is Open. Original cause of wound was Gradually Appeared. The wound is located on the Left,Distal,Lateral Lower Leg. The wound measures 0.1cm length x 0.1cm width x 0.1cm depth; 0.008cm^2 area and 0.001cm^3 volume. There is Fat Layer (Subcutaneous Tissue) Exposed exposed. There is no  tunneling or undermining noted. There is a medium amount of serosanguineous drainage noted. The wound margin is flat and intact. There is no granulation within the wound bed. There is a large (67-100%) amount of necrotic tissue within the wound bed including Adherent Slough. Wound #5 status is Open. Original cause of wound was Trauma. The wound is located on the Left,Posterior Lower Leg. The wound measures 2cm length x 2cm width x 0.2cm depth; 3.142cm^2 area and 0.628cm^3 volume. There is Fat Layer (Subcutaneous Tissue) Exposed exposed. There is no tunneling or undermining noted. There is a medium amount of serous drainage noted. The wound margin is distinct with the outline attached to the wound base. There is no granulation within the wound bed. There is no necrotic tissue within the wound bed. Assessment Active Problems ICD-10 Chronic venous hypertension (idiopathic) with ulcer and inflammation of left lower extremity Non-pressure chronic ulcer of other part of left lower leg with fat layer exposed Atherosclerosis of native arteries of extremities with intermittent claudication, bilateral legs Procedures Wound #4 Pre-procedure diagnosis of Wound #4 is a Trauma, Other located on the Left,Distal,Lateral Lower Leg . There was a Three Layer Compression Therapy Procedure with a pre-treatment ABI of 1.4 by Gretta Cool,  Maudie Mercury, RN. Post procedure Diagnosis Wound #4: Same as Pre-Procedure Plan Wound Cleansing: Wound #4 Left,Distal,Lateral Lower Leg: Clean wound with Normal Saline. Wound #5 Left,Posterior Lower Leg: Clean wound with Normal Saline. Skin Barriers/Peri-Wound Care: Wound #4 Left,Distal,Lateral Lower Leg: Triamcinolone Acetonide Ointment (TCA) Wound #5 Left,Posterior Lower Leg: Triamcinolone Acetonide Ointment (TCA) Dressing Change Frequency: Wound #4 Left,Distal,Lateral Lower Leg: Change dressing every week Wound #5 Left,Posterior Lower Leg: Change dressing every week Follow-up  Appointments: Wound #4 Left,Distal,Lateral Lower Leg: Return Appointment in 1 week. Wound #5 Left,Posterior Lower Leg: Return Appointment in 1 week. Edema Control: Wound #4 Left,Distal,Lateral Lower Leg: 3 Layer Compression System - Left Lower Extremity Wound #5 Left,Posterior Lower Leg: 3 Layer Compression System - Left Lower Extremity [name], [name] ([id number]) 1. My suggestion at this time based on what I am seeing is good to be that we go ahead and initiate for the next week a compression wrap for the left lower extremity try to get some of this edema out and hopefully allow these areas to heal up without complication or concern. 2. I am also can recommend at this point that we use some triamcinolone to the leg in order to help with itching 3. I would also recommend that the patient have a 3 layer compression wrap which I think is appropriate based on his arterial flow as well this should not cause him any complication or concern. We will see patient back for reevaluation in 1 week here in the clinic. If anything worsens or changes patient will contact our office for additional recommendations. Hopefully he will be ready for discharge next week based on what I am seeing today. Electronic Signature(s) Signed: 07/02/2020 2:08:01 PM By: Worthy Keeler PA-C Entered By: Worthy Keeler on 07/02/2020 14:08:01 [name], [name] ([id number]) -------------------------------------------------------------------------------- ROS/PFSH Details Patient Name: [name], [name] Date of Service: 07/02/2020 12:45 PM Medical Record Number: [id number] Patient Account Number: [id number] Date of Birth/Sex: [birth year] (86 y.o. M) Treating RN: Grover Canavan Primary Care Provider: Lavera Guise Other Clinician: Referring Provider: Lavera Guise Treating Provider/Extender: Melburn Hake, Dezmon Conover Weeks in Treatment: 0 Information Obtained From Patient Constitutional Symptoms (General Health) Complaints and  Symptoms: Negative for: Fatigue; Fever; Chills; Marked Weight Change Cardiovascular Medical History: Positive for: Hypertension Past Medical History Notes: HLD Genitourinary Medical History: Positive for: End Stage Renal Disease - CKD stage 3 Integumentary (Skin) Medical History: Negative for: History of Burn; History of pressure wounds Musculoskeletal Medical History: Positive for: Osteoarthritis Negative for: Gout; Rheumatoid Arthritis; Osteomyelitis Neurologic Medical History: Positive for: Seizure Disorder - no medication Negative for: Dementia; Neuropathy; Quadriplegia; Paraplegia Past Medical History Notes: hx CVA in 2011 Immunizations Pneumococcal Vaccine: Received Pneumococcal Vaccination: Yes Implantable Devices None Family and Social History Cancer: No; Diabetes: Yes - Siblings; Heart Disease: Yes - Siblings; Hereditary Spherocytosis: No; Hypertension: Yes - Siblings; Kidney Disease: No; Lung Disease: No; Seizures: No; Stroke: No; Thyroid Problems: No; Tuberculosis: No; Never smoker; Marital Status - Married; Alcohol Use: Never; Drug Use: No History; Caffeine Use: Daily; Financial Concerns: No; Food, Clothing or Shelter Needs: No; Support System Lacking: No; Transportation Concerns: No Electronic Signature(s) Signed: 07/02/2020 5:05:31 PM By: Grover Canavan Signed: 07/02/2020 5:28:43 PM By: Worthy Keeler PA-C Entered By: Grover Canavan on 07/02/2020 13:32:00 [name], [name] ([id number]) [name], [name] ([id number]) -------------------------------------------------------------------------------- SuperBill Details Patient Name: [name], [name] Date of Service: 07/02/2020 Medical Record Number: [id number] Patient Account Number: [id number] Date of Birth/Sex: 07-08-1932 (86 y.o. M) Treating RN: Cornell Barman Primary Care Provider:  BLISS, LAURA Other Clinician: Referring Provider: Lavera Guise Treating Provider/Extender: Melburn Hake, Debara Kamphuis Weeks in Treatment:  0 Diagnosis Coding ICD-10 Codes Code Description I87.332 Chronic venous hypertension (idiopathic) with ulcer and inflammation of left lower extremity L97.822 Non-pressure chronic ulcer of other part of left lower leg with fat layer exposed I70.213 Atherosclerosis of native arteries of extremities with intermittent claudication, bilateral legs Facility Procedures CPT4 Code: 75797282 Description: [phone] - WOUND CARE VISIT-LEV 2 EST PT Modifier: Quantity: 1 CPT4 Code: 61537943 Description: (Facility Use Only) [id number] - APPLY MULTLAY COMPRS LWR LT LEG Modifier: Quantity: 1 Physician Procedures CPT4 Code Description: [id number] - WC PHYS LEVEL 3 - EST PT Modifier: Quantity: 1 CPT4 Code Description: ICD-10 Diagnosis Description I87.332 Chronic venous hypertension (idiopathic) with ulcer and inflammation of left L97.822 Non-pressure chronic ulcer of other part of left lower leg with fat layer ex M73.403 Atherosclerosis of  native arteries of extremities with intermittent claudica Modifier: lower extremity posed tion, bilateral legs Quantity: Electronic Signature(s) Signed: 07/02/2020 2:08:18 PM By: Worthy Keeler PA-C Entered By: Worthy Keeler on 07/02/2020 14:08:18

## 2020-07-09 ENCOUNTER — Ambulatory Visit: Payer: PPO | Admitting: Physician Assistant

## 2020-07-09 ENCOUNTER — Encounter: Payer: PPO | Admitting: Physician Assistant

## 2020-07-09 ENCOUNTER — Other Ambulatory Visit: Payer: Self-pay

## 2020-07-09 DIAGNOSIS — I70213 Atherosclerosis of native arteries of extremities with intermittent claudication, bilateral legs: Secondary | ICD-10-CM | POA: Diagnosis not present

## 2020-07-09 DIAGNOSIS — L97822 Non-pressure chronic ulcer of other part of left lower leg with fat layer exposed: Secondary | ICD-10-CM | POA: Diagnosis not present

## 2020-07-09 DIAGNOSIS — I87332 Chronic venous hypertension (idiopathic) with ulcer and inflammation of left lower extremity: Secondary | ICD-10-CM | POA: Diagnosis not present

## 2020-07-12 DIAGNOSIS — S81802A Unspecified open wound, left lower leg, initial encounter: Secondary | ICD-10-CM | POA: Diagnosis not present

## 2020-07-12 NOTE — Progress Notes (Addendum)
JARETT, DRALLE ([id number]) Visit Report for 07/09/2020 Arrival Information Details Patient Name: [name], [name]. Date of Service: 07/09/2020 2:15 PM Medical Record Number: [id number] Patient Account Number: [id number] Date of Birth/Sex: 05-29-1932 (86 y.o. M) Treating RN: Cornell Barman Primary Care Maureen Delatte: Lavera Guise Other Clinician: Referring Dayvin Aber: Lavera Guise Treating Mayley Lish/Extender: Melburn Hake, HOYT Weeks in Treatment: 1 Visit Information History Since Last Visit All ordered tests and consults were completed: No Patient Arrived: Ambulatory Added or deleted any medications: No Arrival Time: 14:53 Any new allergies or adverse reactions: No Accompanied By: wife Had a fall or experienced change in No Transfer Assistance: None activities of daily living that may affect Patient Identification Verified: Yes risk of falls: Secondary Verification Process Completed: Yes Signs or symptoms of abuse/neglect since last visito No Patient Requires Transmission-Based No Hospitalized since last visit: No Precautions: Implantable device outside of the clinic excluding No Patient Has Alerts: Yes cellular tissue based products placed in the center Patient Alerts: 03/16/20 ABI R 1.15, L since last visit: 1.35 Has Dressing in Place as Prescribed: Yes Has Compression in Place as Prescribed: Yes Pain Present Now: No Electronic Signature(s) Signed: 07/09/2020 4:38:21 PM By: Darci Needle Entered By: Darci Needle on 07/09/2020 14:53:50 [name], [name] ([id number]) -------------------------------------------------------------------------------- Clinic Level of Care Assessment Details Patient Name: [name] Mole B. Date of Service: 07/09/2020 2:15 PM Medical Record Number: [id number] Patient Account Number: [id number] Date of Birth/Sex: 10/18/1932 (86 y.o. M) Treating RN: Grover Canavan Primary Care Liana Camerer: Lavera Guise Other Clinician: Referring Lakita Sahlin: Lavera Guise Treating  Xanthe Couillard/Extender: Melburn Hake, HOYT Weeks in Treatment: 1 Clinic Level of Care Assessment Items TOOL 4 Quantity Score []  - Use when only an EandM is performed on FOLLOW-UP visit 0 ASSESSMENTS - Nursing Assessment / Reassessment X - Reassessment of Co-morbidities (includes updates in patient status) 1 10 X- 1 5 Reassessment of Adherence to Treatment Plan ASSESSMENTS - Wound and Skin Assessment / Reassessment X - Simple Wound Assessment / Reassessment - one wound 1 5 []  - 0 Complex Wound Assessment / Reassessment - multiple wounds []  - 0 Dermatologic / Skin Assessment (not related to wound area) ASSESSMENTS - Focused Assessment X - Circumferential Edema Measurements - multi extremities 1 5 []  - 0 Nutritional Assessment / Counseling / Intervention X- 1 5 Lower Extremity Assessment (monofilament, tuning fork, pulses) []  - 0 Peripheral Arterial Disease Assessment (using hand held doppler) ASSESSMENTS - Ostomy and/or Continence Assessment and Care []  - Incontinence Assessment and Management 0 []  - 0 Ostomy Care Assessment and Management (repouching, etc.) PROCESS - Coordination of Care X - Simple Patient / Family Education for ongoing care 1 15 []  - 0 Complex (extensive) Patient / Family Education for ongoing care []  - 0 Staff obtains Programmer, systems, Records, Test Results / Process Orders []  - 0 Staff telephones HHA, Nursing Homes / Clarify orders / etc []  - 0 Routine Transfer to another Facility (non-emergent condition) []  - 0 Routine Hospital Admission (non-emergent condition) []  - 0 New Admissions / Biomedical engineer / Ordering NPWT, Apligraf, etc. []  - 0 Emergency Hospital Admission (emergent condition) X- 1 10 Simple Discharge Coordination []  - 0 Complex (extensive) Discharge Coordination PROCESS - Special Needs []  - Pediatric / Minor Patient Management 0 []  - 0 Isolation Patient Management []  - 0 Hearing / Language / Visual special needs []  - 0 Assessment of  Community assistance (transportation, D/C planning, etc.) []  - 0 Additional assistance / Altered mentation []  - 0 Support Surface(s) Assessment (bed, cushion, seat, etc.) INTERVENTIONS -  Wound Cleansing / Measurement [name], [name] ([id number]) X- 1 5 Simple Wound Cleansing - one wound []  - 0 Complex Wound Cleansing - multiple wounds X- 1 5 Wound Imaging (photographs - any number of wounds) []  - 0 Wound Tracing (instead of photographs) X- 1 5 Simple Wound Measurement - one wound []  - 0 Complex Wound Measurement - multiple wounds INTERVENTIONS - Wound Dressings X - Small Wound Dressing one or multiple wounds 1 10 []  - 0 Medium Wound Dressing one or multiple wounds []  - 0 Large Wound Dressing one or multiple wounds []  - 0 Application of Medications - topical []  - 0 Application of Medications - injection INTERVENTIONS - Miscellaneous []  - External ear exam 0 []  - 0 Specimen Collection (cultures, biopsies, blood, body fluids, etc.) []  - 0 Specimen(s) / Culture(s) sent or taken to Lab for analysis []  - 0 Patient Transfer (multiple staff / Civil Service fast streamer / Similar devices) []  - 0 Simple Staple / Suture removal (25 or less) []  - 0 Complex Staple / Suture removal (26 or more) []  - 0 Hypo / Hyperglycemic Management (close monitor of Blood Glucose) []  - 0 Ankle / Brachial Index (ABI) - do not check if billed separately X- 1 5 Vital Signs Has the patient been seen at the hospital within the last three years: Yes Total Score: 85 Level Of Care: New/Established - Level 3 Electronic Signature(s) Signed: 07/09/2020 4:25:26 PM By: Grover Canavan Entered By: Grover Canavan on 07/09/2020 15:27:18 [name], [name] ([id number]) -------------------------------------------------------------------------------- Compression Therapy Details Patient Name: [name] Mole B. Date of Service: 07/09/2020 2:15 PM Medical Record Number: [id number] Patient Account Number: [id number] Date of  Birth/Sex: 1932/05/29 (86 y.o. M) Treating RN: Grover Canavan Primary Care Hinda Lindor: Lavera Guise Other Clinician: Referring Idelia Caudell: Lavera Guise Treating Aydin Cavalieri/Extender: Melburn Hake, HOYT Weeks in Treatment: 1 Compression Therapy Performed for Wound Assessment: Wound #4 Left,Distal,Lateral Lower Leg Performed By: Clinician Grover Canavan, RN Compression Type: Three Layer Pre Treatment ABI: 1.4 Post Procedure Diagnosis Same as Pre-procedure Electronic Signature(s) Signed: 07/09/2020 4:25:26 PM By: Grover Canavan Entered By: Grover Canavan on 07/09/2020 15:24:38 Wildrick, [name] ([id number]) -------------------------------------------------------------------------------- Compression Therapy Details Patient Name: [name], [name] Date of Service: 07/09/2020 2:15 PM Medical Record Number: [id number] Patient Account Number: [id number] Date of Birth/Sex: 23-Jun-1932 (86 y.o. M) Treating RN: Grover Canavan Primary Care Tula Schryver: Lavera Guise Other Clinician: Referring Christoher Drudge: Lavera Guise Treating Dinero Chavira/Extender: Melburn Hake, HOYT Weeks in Treatment: 1 Compression Therapy Performed for Wound Assessment: Wound #5 Left,Posterior Lower Leg Performed By: Clinician Grover Canavan, RN Compression Type: Three Layer Pre Treatment ABI: 1.4 Post Procedure Diagnosis Same as Pre-procedure Electronic Signature(s) Signed: 07/09/2020 4:25:26 PM By: Grover Canavan Entered By: Grover Canavan on 07/09/2020 15:24:38 Gates, [name] ([id number]) -------------------------------------------------------------------------------- Compression Therapy Details Patient Name: [name], [name] Date of Service: 07/09/2020 2:15 PM Medical Record Number: [id number] Patient Account Number: [id number] Date of Birth/Sex: 06-18-1932 (86 y.o. M) Treating RN: Grover Canavan Primary Care Velda Wendt: Lavera Guise Other Clinician: Referring Welles Walthall: Lavera Guise Treating Christyan Reger/Extender: Melburn Hake, HOYT Weeks  in Treatment: 1 Compression Therapy Performed for Wound Assessment: Wound #6 Left,Proximal,Lateral Lower Leg Performed By: Clinician Grover Canavan, RN Compression Type: Three Layer Pre Treatment ABI: 1.4 Post Procedure Diagnosis Same as Pre-procedure Electronic Signature(s) Signed: 07/09/2020 4:25:26 PM By: Grover Canavan Entered By: Grover Canavan on 07/09/2020 15:24:38 Aicher, [name] ([id number]) -------------------------------------------------------------------------------- Encounter Discharge Information Details Patient Name: NYZIR, DUBOIS B. Date of Service: 07/09/2020 2:15 PM Medical Record Number: [id number] Patient Account Number: [id number] Date  of Birth/Sex: Aug 29, 1932 (86 y.o. M) Treating RN: Grover Canavan Primary Care Donovon Micheletti: Lavera Guise Other Clinician: Referring Malary Aylesworth: Lavera Guise Treating Asaiah Scarber/Extender: Sharalyn Ink in Treatment: 1 Encounter Discharge Information Items Discharge Condition: Stable Ambulatory Status: Ambulatory Discharge Destination: Home Transportation: Private Auto Accompanied By: spouse Schedule Follow-up Appointment: Yes Clinical Summary of Care: Electronic Signature(s) Signed: 07/09/2020 4:25:26 PM By: Grover Canavan Entered By: Grover Canavan on 07/09/2020 15:28:52 Rodriques, [name] ([id number]) -------------------------------------------------------------------------------- Lower Extremity Assessment Details Patient Name: [name] Mole B. Date of Service: 07/09/2020 2:15 PM Medical Record Number: [id number] Patient Account Number: [id number] Date of Birth/Sex: [birth year] (86 y.o. M) Treating RN: Cornell Barman Primary Care Tenille Morrill: Lavera Guise Other Clinician: Referring Nickolaus Bordelon: Lavera Guise Treating Willy Vorce/Extender: Melburn Hake, HOYT Weeks in Treatment: 1 Edema Assessment Assessed: [Left: Yes] [Right: No] Edema: [Left: Ye] [Right: s] Calf Left: Right: Point of Measurement: 29 cm From Medial Instep 32.5 cm  cm Ankle Left: Right: Point of Measurement: 9 cm From Medial Instep 25 cm cm Vascular Assessment Pulses: Dorsalis Pedis Palpable: [Left:Yes] Posterior Tibial Palpable: [Left:Yes] Electronic Signature(s) Signed: 07/09/2020 4:37:20 PM By: Gretta Cool, BSN, RN, CWS, Kim RN, BSN Signed: 07/09/2020 4:38:21 PM By: Darci Needle Entered By: Darci Needle on 07/09/2020 15:12:58 Capistran, [name] ([id number]) -------------------------------------------------------------------------------- Multi Wound Chart Details Patient Name: [name], [name] Date of Service: 07/09/2020 2:15 PM Medical Record Number: [id number] Patient Account Number: [id number] Date of Birth/Sex: [birth year] (86 y.o. M) Treating RN: Grover Canavan Primary Care Slayde Brault: Lavera Guise Other Clinician: Referring Tristyn Pharris: Lavera Guise Treating Mical Kicklighter/Extender: Melburn Hake, HOYT Weeks in Treatment: 1 Vital Signs Height(in): 70 Pulse(bpm): 27 Weight(lbs): 179 Blood Pressure(mmHg): 140/69 Body Mass Index(BMI): 26 Temperature(F): 98.3 Respiratory Rate(breaths/min): 18 Photos: Wound Location: Left, Distal, Lateral Lower Leg Left, Posterior Lower Leg Left, Proximal, Lateral Lower Leg Wounding Event: Gradually Appeared Trauma Gradually Appeared Primary Etiology: Trauma, Other Trauma, Other Venous Leg Ulcer Comorbid History: Hypertension, End Stage Renal Hypertension, End Stage Renal Hypertension, End Stage Renal Disease, Osteoarthritis, Seizure Disease, Osteoarthritis, Seizure Disease, Osteoarthritis, Seizure Disorder Disorder Disorder Date Acquired: 02/02/2020 07/02/2020 07/09/2020 Weeks of Treatment: 1 1 0 Wound Status: Open Open Open Measurements L x W x D (cm) 0.1x0.1x0.1 0.3x0.3x0.1 1.5x0.9x0.1 Area (cm) : 0.008 0.071 1.06 Volume (cm) : 0.001 0.007 0.106 % Reduction in Area: 0.00% 97.70% 0.00% % Reduction in Volume: 0.00% 98.90% 0.00% Classification: Partial Thickness Partial Thickness Partial Thickness Exudate Amount:  Medium Medium Medium Exudate Type: Serosanguineous Serous Serosanguineous Exudate Color: red, brown amber red, brown Wound Margin: Flat and Intact Distinct, outline attached Distinct, outline attached Granulation Amount: None Present (0%) None Present (0%) Small (1-33%) Granulation Quality: N/A N/A Red Necrotic Amount: Large (67-100%) None Present (0%) Large (67-100%) Exposed Structures: Fat Layer (Subcutaneous Tissue): Fat Layer (Subcutaneous Tissue): Fat Layer (Subcutaneous Tissue): Yes Yes Yes Fascia: No Fascia: No Fascia: No Tendon: No Tendon: No Tendon: No Muscle: No Muscle: No Muscle: No Joint: No Joint: No Joint: No Bone: No Bone: No Bone: No Epithelialization: None None Small (1-33%) Treatment Notes Electronic Signature(s) Signed: 07/09/2020 4:25:26 PM By: Grover Canavan Entered By: Grover Canavan on 07/09/2020 15:19:49 [name], [name] ([id number]) -------------------------------------------------------------------------------- Multi-Disciplinary Care Plan Details Patient Name: [name], [name] Date of Service: 07/09/2020 2:15 PM Medical Record Number: [id number] Patient Account Number: [id number] Date of Birth/Sex: 1932-03-26 (86 y.o. M) Treating RN: Grover Canavan Primary Care Nelline Lio: Lavera Guise Other Clinician: Referring Maneh Sieben: Lavera Guise Treating Synthia Fairbank/Extender: Melburn Hake, HOYT Weeks in Treatment: 1 Active Inactive Orientation to the Wound Care Program Nursing  Diagnoses: Knowledge deficit related to the wound healing center program Goals: Patient/caregiver will verbalize understanding of the [place] Date Initiated: 07/02/2020 Target Resolution Date: 07/02/2020 Goal Status: Active Interventions: Provide education on orientation to the wound center Notes: Venous Leg Ulcer Nursing Diagnoses: Actual venous Insuffiency (use after diagnosis is confirmed) Goals: Patient will maintain optimal edema control Date Initiated:  07/02/2020 Target Resolution Date: 07/09/2020 Goal Status: Active Interventions: Assess peripheral edema status every visit. Compression as ordered Treatment Activities: Therapeutic compression applied : 07/02/2020 Notes: Wound/Skin Impairment Nursing Diagnoses: Impaired tissue integrity Goals: Ulcer/skin breakdown will have a volume reduction of 30% by week 4 Date Initiated: 07/02/2020 Target Resolution Date: 08/02/2020 Goal Status: Active Interventions: Assess ulceration(s) every visit Treatment Activities: Skin care regimen initiated : 07/02/2020 Notes: Electronic Signature(s) Signed: 07/09/2020 4:25:26 PM By: Buena Irish ([id number]) Entered By: Grover Canavan on 07/09/2020 15:19:41 [name], [name] ([id number]) -------------------------------------------------------------------------------- Pain Assessment Details Patient Name: [name] Mole B. Date of Service: 07/09/2020 2:15 PM Medical Record Number: [id number] Patient Account Number: [id number] Date of Birth/Sex: 1931/12/08 (86 y.o. M) Treating RN: Cornell Barman Primary Care Amanee Iacovelli: Lavera Guise Other Clinician: Referring Casondra Gasca: Lavera Guise Treating Jenicka Coxe/Extender: Melburn Hake, HOYT Weeks in Treatment: 1 Active Problems Location of Pain Severity and Description of Pain Patient Has Paino No Site Locations With Dressing Change: No Pain Management and Medication Current Pain Management: Electronic Signature(s) Signed: 07/09/2020 4:37:20 PM By: Gretta Cool, BSN, RN, CWS, Kim RN, BSN Signed: 07/09/2020 4:38:21 PM By: Darci Needle Entered By: Darci Needle on 07/09/2020 14:55:04 [place], [name] ([id number]) -------------------------------------------------------------------------------- Patient/Caregiver Education Details Patient Name: [name], [name] Date of Service: 07/09/2020 2:15 PM Medical Record Number: [id number] Patient Account Number: [id number] Date of Birth/Gender: 1932/05/23 (86 y.o.  M) Treating RN: Grover Canavan Primary Care Physician: Lavera Guise Other Clinician: Referring Physician: Lavera Guise Treating Physician/Extender: Sharalyn Ink in Treatment: 1 Education Assessment Education Provided To: Patient Education Topics Provided Wound/Skin Impairment: Handouts: Skin Care Do's and Dont's Methods: Explain/Verbal Responses: State content correctly Electronic Signature(s) Signed: 07/09/2020 4:25:26 PM By: Grover Canavan Entered By: Grover Canavan on 07/09/2020 15:27:39 Reichenbach, [name] ([id number]) -------------------------------------------------------------------------------- Wound Assessment Details Patient Name: [name] Mole B. Date of Service: 07/09/2020 2:15 PM Medical Record Number: [id number] Patient Account Number: [id number] Date of Birth/Sex: [birth year] (86 y.o. M) Treating RN: Cornell Barman Primary Care Korine Winton: Lavera Guise Other Clinician: Referring Damani Kelemen: Lavera Guise Treating Daisa Stennis/Extender: Melburn Hake, HOYT Weeks in Treatment: 1 Wound Status Wound Number: 4 Primary Trauma, Other Etiology: Wound Location: Left, Distal, Lateral Lower Leg Wound Status: Open Wounding Event: Gradually Appeared Comorbid Hypertension, End Stage Renal Disease, Osteoarthritis, Date Acquired: 02/02/2020 History: Seizure Disorder Weeks Of Treatment: 1 Clustered Wound: No Photos Wound Measurements Length: (cm) 0.1 Width: (cm) 0.1 Depth: (cm) 0.1 Area: (cm) 0.008 Volume: (cm) 0.001 % Reduction in Area: 0% % Reduction in Volume: 0% Epithelialization: None Wound Description Classification: Partial Thickness Wound Margin: Flat and Intact Exudate Amount: Medium Exudate Type: Serosanguineous Exudate Color: red, brown Foul Odor After Cleansing: No Slough/Fibrino Yes Wound Bed Granulation Amount: None Present (0%) Exposed Structure Necrotic Amount: Large (67-100%) Fascia Exposed: No Necrotic Quality: Adherent Slough Fat Layer (Subcutaneous  Tissue) Exposed: Yes Tendon Exposed: No Muscle Exposed: No Joint Exposed: No Bone Exposed: No Treatment Notes Wound #4 (Left, Distal, Lateral Lower Leg) Notes TCA, ABD, 3LL Electronic Signature(s) Signed: 07/09/2020 4:37:20 PM By: Gretta Cool, BSN, RN, CWS, Kim RN, BSN [place], Morrison B. ([id number]) Signed: 07/09/2020 4:38:21 PM By: Darci Needle  Entered By: Darci Needle on 07/09/2020 15:04:35 Amason, [name] ([id number]) -------------------------------------------------------------------------------- Wound Assessment Details Patient Name: [name], [name] Date of Service: 07/09/2020 2:15 PM Medical Record Number: [id number] Patient Account Number: [id number] Date of Birth/Sex: 1932/02/06 (86 y.o. M) Treating RN: Cornell Barman Primary Care Tyeisha Dinan: Lavera Guise Other Clinician: Referring Zenith Kercheval: Lavera Guise Treating Joyice Magda/Extender: Melburn Hake, HOYT Weeks in Treatment: 1 Wound Status Wound Number: 5 Primary Trauma, Other Etiology: Wound Location: Left, Posterior Lower Leg Wound Status: Open Wounding Event: Trauma Comorbid Hypertension, End Stage Renal Disease, Osteoarthritis, Date Acquired: 07/02/2020 History: Seizure Disorder Weeks Of Treatment: 1 Clustered Wound: No Photos Wound Measurements Length: (cm) 0.3 Width: (cm) 0.3 Depth: (cm) 0.1 Area: (cm) 0.071 Volume: (cm) 0.007 % Reduction in Area: 97.7% % Reduction in Volume: 98.9% Epithelialization: None Wound Description Classification: Partial Thickness Wound Margin: Distinct, outline attached Exudate Amount: Medium Exudate Type: Serous Exudate Color: amber Foul Odor After Cleansing: No Slough/Fibrino No Wound Bed Granulation Amount: None Present (0%) Exposed Structure Necrotic Amount: None Present (0%) Fascia Exposed: No Fat Layer (Subcutaneous Tissue) Exposed: Yes Tendon Exposed: No Muscle Exposed: No Joint Exposed: No Bone Exposed: No Treatment Notes Wound #5 (Left, Posterior Lower Leg) Notes TCA,  ABD, 3LL Electronic Signature(s) Signed: 07/09/2020 4:37:20 PM By: Gretta Cool, BSN, RN, CWS, Kim RN, BSN [place], [place] B. ([id number]) Signed: 07/09/2020 4:38:21 PM By: Darci Needle Entered By: Darci Needle on 07/09/2020 15:04:55 [place], [name] ([id number]) -------------------------------------------------------------------------------- Wound Assessment Details Patient Name: [name], [name] Date of Service: 07/09/2020 2:15 PM Medical Record Number: [id number] Patient Account Number: [id number] Date of Birth/Sex: 11/24/1931 (86 y.o. M) Treating RN: Cornell Barman Primary Care Tanishi Nault: Lavera Guise Other Clinician: Referring [place] Segler: Lavera Guise Treating Nylene Inlow/Extender: Melburn Hake, HOYT Weeks in Treatment: 1 Wound Status Wound Number: 6 Primary Venous Leg Ulcer Etiology: Wound Location: Left, Proximal, Lateral Lower Leg Wound Status: Open Wounding Event: Gradually Appeared Comorbid Hypertension, End Stage Renal Disease, Osteoarthritis, Date Acquired: 07/09/2020 History: Seizure Disorder Weeks Of Treatment: 0 Clustered Wound: No Photos Wound Measurements Length: (cm) 1.5 Width: (cm) 0.9 Depth: (cm) 0.1 Area: (cm) 1.06 Volume: (cm) 0.106 % Reduction in Area: 0% % Reduction in Volume: 0% Epithelialization: Small (1-33%) Tunneling: No Undermining: No Wound Description Classification: Partial Thickness Wound Margin: Distinct, outline attached Exudate Amount: Medium Exudate Type: Serosanguineous Exudate Color: red, brown Foul Odor After Cleansing: No Slough/Fibrino No Wound Bed Granulation Amount: Small (1-33%) Exposed Structure Granulation Quality: Red Fascia Exposed: No Necrotic Amount: Large (67-100%) Fat Layer (Subcutaneous Tissue) Exposed: Yes Necrotic Quality: Adherent Slough Tendon Exposed: No Muscle Exposed: No Joint Exposed: No Bone Exposed: No Treatment Notes Wound #6 (Left, Proximal, Lateral Lower Leg) Notes TCA, ABD, 3LL Electronic  Signature(s) Signed: 07/09/2020 4:37:20 PM By: Gretta Cool, BSN, RN, CWS, Kim RN, BSN [place], [place] B. ([id number]) Signed: 07/09/2020 4:38:21 PM By: Darci Needle Entered By: Darci Needle on 07/09/2020 15:11:54 B[place]man, [name] ([id number]) -------------------------------------------------------------------------------- [place] Details Patient Name: TRAVORIS, BUSHEY B. Date of Service: 07/09/2020 2:15 PM Medical Record Number: [id number] Patient Account Number: [id number] Date of Birth/Sex: Oct 01, 1932 (86 y.o. M) Treating RN: Cornell Barman Primary Care Enrico Eaddy: Lavera Guise Other Clinician: Referring Massiah Longanecker: Lavera Guise Treating Xan Ingraham/Extender: Melburn Hake, HOYT Weeks in Treatment: 1 Vital Signs Time Taken: 02:30 Temperature (F): 98.3 Height (in): 70 Pulse (bpm): 83 Weight (lbs): 179 Respiratory Rate (breaths/min): 18 Body Mass Index (BMI): 25.7 Blood Pressure (mmHg): 140/69 Reference Range: 80 - 120 mg / dl Electronic Signature(s) Signed: 07/09/2020 4:38:21 PM By: Darci Needle Entered By: Darci Needle  on 07/09/2020 14:54:49

## 2020-07-12 NOTE — Progress Notes (Signed)
[name], [name] ([id number]) Visit Report for 07/09/2020 Chief Complaint Document Details Patient Name: [name], [name]. Date of Service: 07/09/2020 2:15 PM Medical Record Number: [id number] Patient Account Number: [id number] Date of Birth/Sex: 29-Dec-1931 (86 y.o. M) Treating RN: Cornell Barman Primary Care Provider: Lavera Guise Other Clinician: Referring Provider: Lavera Guise Treating Provider/Extender: Worthy Keeler Weeks in Treatment: 1 Information Obtained from: Patient Chief Complaint Left LE Ulcers Electronic Signature(s) Signed: 07/09/2020 2:44:23 PM By: Worthy Keeler PA-C Entered By: Worthy Keeler on 07/09/2020 14:44:23 [name], [name] -------------------------------------------------------------------------------- HPI Details Patient Name: [name] Mole B. Date of Service: 07/09/2020 2:15 PM Medical Record Number: [id number] Patient Account Number: [id number] Date of Birth/Sex: [birth year] (86 y.o. M) Treating RN: Cornell Barman Primary Care Provider: Lavera Guise Other Clinician: Referring Provider: Lavera Guise Treating Provider/Extender: Melburn Hake, Charma Mocarski Weeks in Treatment: 1 History of Present Illness HPI Description: ADMISSION 03/04/2020 Patient is an 86 year old man who arrives accompanied by his wife. He states that he has had wounds on his bilateral lower extremities that started with increased swelling in his lower extremities. He has quite significant bilateral lower extremity pitting edema but he states that this is only been there for 2 or 3 weeks. Says he saw his primary doctor who put him on what sounds like a diuretic. He has developed small wounds on the left lateral which is actually the most prominent wound. Very small superficial wounds on the left anterior and a cluster of very small wounds on the right lateral. These are not particularly painful although he states that his legs are itchy. on arrival in the clinic our intake nurse could not obtain pulses  bilaterally. She could not maintain of enough of a Doppler in the right dorsalis pedis to get an ABI and on the left she could not even obtain it by Doppler. The patient does have pain in his legs when he walks. He says 10 to 15 minutes he has to sit down and rest although he thought that this was because of his knees. He also mentions that his wounds are painful at night when he is lying in bed sometimes waking him up at 5:00 in the morning. Past medical history includes hypertension, CVA in 2011 with no residual effects, osteoarthritis of both knees, recent seizure disorder but apparently complete work-up was negative, ITP, vitamin B12 deficiency and stage III chronic renal failure We could not obtain ABIs in our clinic as noted above 03/11/2020 upon evaluation today patient actually appears to be doing much better in regard to his bilateral lower extremities. Fortunately there is no signs of infection. He actually saw Dr. Dellia Nims during his initial visit last week and he was treated very appropriately his wounds appear to be doing much better. Also don't have the complete evaluation as far as his arterial study is concerned that the vascular doctor has not read it yet. With that being said it did appear that the patient's findings were consistent with being okay. His TBI's were somewhat low at 0.39 on the right and 0.49 on the left with an ABI of 1.15 on the right and 1.35 on the left. With that being said I think that the fact that he is healing and has tolerated the wrap very well at this point is a good indication that he is good to do just fine. 03/18/2020 upon evaluation today patient appears to be doing pretty well in regard to his leg ulcer on the left. Fortunately there is no signs  of active infection at this time. No fevers, chills, nausea, vomiting, or diarrhea. I am actually rather pleased with how things seem to be progressing. Overall the patient's wound I think is ready for collagen I do  not believe there is enough drainage to warrant having to continue with the alginate at this point. 5/13; small wound on the left lateral lower leg. We are using silver collagen. Not much improvement this week in surface area or condition of the wound. Wrapping and kerlix Coban. Previous ABI was noncompressible 5/20; the patient's wound on the left lateral lower leg is smaller. We have been using silver collagen. I increased him from 2-3 layer compressions last week. Edema control is better Since he was last here he tells me he was in the [facility] for 48 hours with what sounds like a TIA. I have not checked these records 04/09/2020 on evaluation today patient appears to be doing well with regard to his wound. This is measuring significantly better and seems to be doing excellent. The collagen I think is still the best option for him at this point. Fortunately there is no evidence of active infection at this time. 04/16/2020 upon evaluation today patient appears to be doing excellent in regard to his lower extremity ulcer. In fact this is measuring quite a bit smaller and overall I feel like he is doing very well in that regard. There is no signs of active infection at this time. No fevers, chills, nausea, vomiting, or diarrhea. I think he is very close to complete resolution. 04/22/2020 upon evaluation today patient actually appears to be doing excellent in regard to his leg ulcer today. In fact this appears to be completely healed based on what I am seeing. I may want to put a protective dressing over it for just a couple of days just to ensure that nothing drains or causes him any issues. Readmission: 07/02/2020 upon evaluation today patient presents for reevaluation here in our clinic concerning issues that he has been having with his left lower extremity. I did review lab work from about a week ago which showed that at that time his white blood count was at 3.8 and somewhat elevated. Right now  I do not see any signs of active infection at this time which is great news. I also did review his arterial studies which were dated 03/09/2020. This showed that he did have no evidence of right or left severe peripheral vascular disease. Obviously this is good his blood flow is not perfect but nonetheless is also not severe. I do believe that he would tolerate a mild compression wrap without any complication. He tells me he also scratched the side of his leg last night. But that the area that I previously helped him heal seems to still be doing well. I did have a look at that very closely today that we detailed in the physical exam. Fortunately there is no signs of active infection at this time which is great news. No fevers, chills, nausea, vomiting, or diarrhea. 07/09/2020 on evaluation today patient actually appears to be showing some signs of improvement in regard to his leg ulcers. Fortunately there is no evidence of active infection and a lot of the areas healed that were open last week although he has 1 new area that is open currently. Fortunately there is no signs of active infection which is great news and swelling is definitely down. [name], [name] ([id number]) Electronic Signature(s) Signed: 07/09/2020 3:38:42 PM By: Melburn Hake,  Philander Ake PA-C Entered By: Worthy Keeler on 07/09/2020 15:38:42 [name], [name] ([id number]) -------------------------------------------------------------------------------- Physical Exam Details Patient Name: CAYDYN, SPRUNG B. Date of Service: 07/09/2020 2:15 PM Medical Record Number: [id number] Patient Account Number: [id number] Date of Birth/Sex: 1932/03/20 (86 y.o. M) Treating RN: Cornell Barman Primary Care Provider: Lavera Guise Other Clinician: Referring Provider: Lavera Guise Treating Provider/Extender: Melburn Hake, Daeveon Zweber Weeks in Treatment: 1 Constitutional Well-nourished and well-hydrated in no acute distress. Respiratory normal breathing without  difficulty. Psychiatric this patient is able to make decisions and demonstrates good insight into disease process. Alert and Oriented x 3. pleasant and cooperative. Notes Upon inspection patient's wounds again showed signs of good epithelization and a lot of areas he does seem to making progress. Unfortunately they note that he has not been able to put his compression socks on he has them at home but again that is doing him no good if he cannot wear them. We did discuss a Velcro compression wrap today. Electronic Signature(s) Signed: 07/09/2020 3:39:00 PM By: Worthy Keeler PA-C Entered By: Worthy Keeler on 07/09/2020 15:39:00 [name], [name] ([id number]) -------------------------------------------------------------------------------- Physician Orders Details Patient Name: [name], [name] B. Date of Service: 07/09/2020 2:15 PM Medical Record Number: [id number] Patient Account Number: [id number] Date of Birth/Sex: 08-Dec-1931 (86 y.o. M) Treating RN: Grover Canavan Primary Care Provider: Lavera Guise Other Clinician: Referring Provider: Lavera Guise Treating Provider/Extender: Melburn Hake, Aceyn Kathol Weeks in Treatment: 1 Verbal / Phone Orders: No Diagnosis Coding ICD-10 Coding Code Description I87.332 Chronic venous hypertension (idiopathic) with ulcer and inflammation of left lower extremity L97.822 Non-pressure chronic ulcer of other part of left lower leg with fat layer exposed I70.213 Atherosclerosis of native arteries of extremities with intermittent claudication, bilateral legs Wound Cleansing Wound #4 Left,Distal,Lateral Lower Leg o Clean wound with Normal Saline. Wound #5 Left,Posterior Lower Leg o Clean wound with Normal Saline. Skin Barriers/Peri-Wound Care Wound #4 Left,Distal,Lateral Lower Leg o Triamcinolone Acetonide Ointment (TCA) Wound #5 Left,Posterior Lower Leg o Triamcinolone Acetonide Ointment (TCA) Dressing Change Frequency Wound #4 Left,Distal,Lateral Lower  Leg o Change dressing every week Wound #5 Left,Posterior Lower Leg o Change dressing every week Follow-up Appointments Wound #4 Left,Distal,Lateral Lower Leg o Return Appointment in 1 week. Wound #5 Left,Posterior Lower Leg o Return Appointment in 1 week. Edema Control Wound #4 Left,Distal,Lateral Lower Leg o 3 Layer Compression System - Left Lower Extremity Wound #5 Left,Posterior Lower Leg o 3 Layer Compression System - Left Lower Extremity Notes Farrow wrap 4000 Electronic Signature(s) Signed: 07/09/2020 4:25:26 PM By: Grover Canavan Signed: 07/09/2020 4:47:28 PM By: Worthy Keeler PA-C Entered By: Grover Canavan on 07/09/2020 15:26:26 [name], [name] ([id number]) -------------------------------------------------------------------------------- Problem List Details Patient Name: [name], [name] B. Date of Service: 07/09/2020 2:15 PM Medical Record Number: [id number] Patient Account Number: [id number] Date of Birth/Sex: 18-Nov-1931 (86 y.o. M) Treating RN: Cornell Barman Primary Care Provider: Lavera Guise Other Clinician: Referring Provider: Lavera Guise Treating Provider/Extender: Worthy Keeler Weeks in Treatment: 1 Active Problems ICD-10 Encounter Code Description Active Date MDM Diagnosis I87.332 Chronic venous hypertension (idiopathic) with ulcer and inflammation of 07/02/2020 No Yes left lower extremity L97.822 Non-pressure chronic ulcer of other part of left lower leg with fat layer 07/02/2020 No Yes exposed I70.213 Atherosclerosis of native arteries of extremities with intermittent 07/02/2020 No Yes claudication, bilateral legs Inactive Problems Resolved Problems Electronic Signature(s) Signed: 07/09/2020 2:44:17 PM By: Worthy Keeler PA-C Entered By: Worthy Keeler on 07/09/2020 14:44:16 [name], [name] ([id number]) -------------------------------------------------------------------------------- Progress Note Details Patient Name:  [name], [name] Date of  Service: 07/09/2020 2:15 PM Medical Record Number: [id number] Patient Account Number: [id number] Date of Birth/Sex: 1932/02/12 (86 y.o. M) Treating RN: Cornell Barman Primary Care Provider: Lavera Guise Other Clinician: Referring Provider: Lavera Guise Treating Provider/Extender: Melburn Hake, Ronelle Michie Weeks in Treatment: 1 Subjective Chief Complaint Information obtained from Patient Left LE Ulcers History of Present Illness (HPI) ADMISSION 03/04/2020 Patient is an 86 year old man who arrives accompanied by his wife. He states that he has had wounds on his bilateral lower extremities that started with increased swelling in his lower extremities. He has quite significant bilateral lower extremity pitting edema but he states that this is only been there for 2 or 3 weeks. Says he saw his primary doctor who put him on what sounds like a diuretic. He has developed small wounds on the left lateral which is actually the most prominent wound. Very small superficial wounds on the left anterior and a cluster of very small wounds on the right lateral. These are not particularly painful although he states that his legs are itchy. on arrival in the clinic our intake nurse could not obtain pulses bilaterally. She could not maintain of enough of a Doppler in the right dorsalis pedis to get an ABI and on the left she could not even obtain it by Doppler. The patient does have pain in his legs when he walks. He says 10 to 15 minutes he has to sit down and rest although he thought that this was because of his knees. He also mentions that his wounds are painful at night when he is lying in bed sometimes waking him up at 5:00 in the morning. Past medical history includes hypertension, CVA in 2011 with no residual effects, osteoarthritis of both knees, recent seizure disorder but apparently complete work-up was negative, ITP, vitamin B12 deficiency and stage III chronic renal failure We could not obtain ABIs in our clinic as  noted above 03/11/2020 upon evaluation today patient actually appears to be doing much better in regard to his bilateral lower extremities. Fortunately there is no signs of infection. He actually saw Dr. Dellia Nims during his initial visit last week and he was treated very appropriately his wounds appear to be doing much better. Also don't have the complete evaluation as far as his arterial study is concerned that the vascular doctor has not read it yet. With that being said it did appear that the patient's findings were consistent with being okay. His TBI's were somewhat low at 0.39 on the right and 0.49 on the left with an ABI of 1.15 on the right and 1.35 on the left. With that being said I think that the fact that he is healing and has tolerated the wrap very well at this point is a good indication that he is good to do just fine. 03/18/2020 upon evaluation today patient appears to be doing pretty well in regard to his leg ulcer on the left. Fortunately there is no signs of active infection at this time. No fevers, chills, nausea, vomiting, or diarrhea. I am actually rather pleased with how things seem to be progressing. Overall the patient's wound I think is ready for collagen I do not believe there is enough drainage to warrant having to continue with the alginate at this point. 5/13; small wound on the left lateral lower leg. We are using silver collagen. Not much improvement this week in surface area or condition of the wound. Wrapping and kerlix Coban. Previous ABI was noncompressible  5/20; the patient's wound on the left lateral lower leg is smaller. We have been using silver collagen. I increased him from 2-3 layer compressions last week. Edema control is better Since he was last here he tells me he was in the [facility] for [facility] hours with what sounds like a TIA. I have not checked these records 04/09/2020 on evaluation today patient appears to be doing well with regard to his wound. This is  measuring significantly better and seems to be doing excellent. The collagen I think is still the best option for him at this point. Fortunately there is no evidence of active infection at this time. 04/16/2020 upon evaluation today patient appears to be doing excellent in regard to his lower extremity ulcer. In fact this is measuring quite a bit smaller and overall I feel like he is doing very well in that regard. There is no signs of active infection at this time. No fevers, chills, nausea, vomiting, or diarrhea. I think he is very close to complete resolution. 04/22/2020 upon evaluation today patient actually appears to be doing excellent in regard to his leg ulcer today. In fact this appears to be completely healed based on what I am seeing. I may want to put a protective dressing over it for just a couple of days just to ensure that nothing drains or causes him any issues. Readmission: 07/02/2020 upon evaluation today patient presents for reevaluation here in our clinic concerning issues that he has been having with his left lower extremity. I did review lab work from about a week ago which showed that at that time his white blood count was at 3.8 and somewhat elevated. Right now I do not see any signs of active infection at this time which is great news. I also did review his arterial studies which were dated 03/09/2020. This showed that he did have no evidence of right or left severe peripheral vascular disease. Obviously this is good his blood flow is not perfect but nonetheless is also not severe. I do believe that he would tolerate a mild compression wrap without any complication. He tells me he also scratched the side of his leg last night. But that the area that I previously helped him heal seems to still be doing well. I did have a look at that very closely today that we detailed in the physical exam. Fortunately there is no signs of active infection at this time which is great news.  No fevers, chills, nausea, vomiting, or diarrhea. 07/09/2020 on evaluation today patient actually appears to be showing some signs of improvement in regard to his leg ulcers. Fortunately there is [name], [name]. ([id number]) no evidence of active infection and a lot of the areas healed that were open last week although he has 1 new area that is open currently. Fortunately there is no signs of active infection which is great news and swelling is definitely down. Objective Constitutional Well-nourished and well-hydrated in no acute distress. Vitals Time Taken: 2:30 AM, Height: 70 in, Weight: 179 lbs, BMI: 25.7, Temperature: 98.3 F, Pulse: 83 bpm, Respiratory Rate: 18 breaths/min, Blood Pressure: 140/69 mmHg. Respiratory normal breathing without difficulty. Psychiatric this patient is able to make decisions and demonstrates good insight into disease process. Alert and Oriented x 3. pleasant and cooperative. General Notes: Upon inspection patient's wounds again showed signs of good epithelization and a lot of areas he does seem to making progress. Unfortunately they note that he has not been able to put  his compression socks on he has them at home but again that is doing him no good if he cannot wear them. We did discuss a Velcro compression wrap today. Integumentary (Hair, Skin) Wound #4 status is Open. Original cause of wound was Gradually Appeared. The wound is located on the Left,Distal,Lateral Lower Leg. The wound measures 0.1cm length x 0.1cm width x 0.1cm depth; 0.008cm^2 area and 0.001cm^3 volume. There is Fat Layer (Subcutaneous Tissue) exposed. There is a medium amount of serosanguineous drainage noted. The wound margin is flat and intact. There is no granulation within the wound bed. There is a large (67-100%) amount of necrotic tissue within the wound bed including Adherent Slough. Wound #5 status is Open. Original cause of wound was Trauma. The wound is located on the Left,Posterior  Lower Leg. The wound measures 0.3cm length x 0.3cm width x 0.1cm depth; 0.071cm^2 area and 0.007cm^3 volume. There is Fat Layer (Subcutaneous Tissue) exposed. There is a medium amount of serous drainage noted. The wound margin is distinct with the outline attached to the wound base. There is no granulation within the wound bed. There is no necrotic tissue within the wound bed. Wound #6 status is Open. Original cause of wound was Gradually Appeared. The wound is located on the Left,Proximal,Lateral Lower Leg. The wound measures 1.5cm length x 0.9cm width x 0.1cm depth; 1.06cm^2 area and 0.106cm^3 volume. There is Fat Layer (Subcutaneous Tissue) exposed. There is no tunneling or undermining noted. There is a medium amount of serosanguineous drainage noted. The wound margin is distinct with the outline attached to the wound base. There is small (1-33%) red granulation within the wound bed. There is a large (67-100%) amount of necrotic tissue within the wound bed including Adherent Slough. Assessment Active Problems ICD-10 Chronic venous hypertension (idiopathic) with ulcer and inflammation of left lower extremity Non-pressure chronic ulcer of other part of left lower leg with fat layer exposed Atherosclerosis of native arteries of extremities with intermittent claudication, bilateral legs Procedures Wound #4 Pre-procedure diagnosis of Wound #4 is a Trauma, Other located on the Left,Distal,Lateral Lower Leg . There was a Three Layer Compression Therapy Procedure with a pre-treatment ABI of 1.4 by Grover Canavan, RN. Post procedure Diagnosis Wound #4: Same as Pre-Procedure Wound #5 Pre-procedure diagnosis of Wound #5 is a Trauma, Other located on the Left,Posterior Lower Leg . There was a Three Layer Compression Therapy Siebenaler, Kayl B. ([id number]) Procedure with a pre-treatment ABI of 1.4 by Grover Canavan, RN. Post procedure Diagnosis Wound #5: Same as Pre-Procedure Wound #6 Pre-procedure  diagnosis of Wound #6 is a Venous Leg Ulcer located on the Left,Proximal,Lateral Lower Leg . There was a Three Layer Compression Therapy Procedure with a pre-treatment ABI of 1.4 by Grover Canavan, RN. Post procedure Diagnosis Wound #6: Same as Pre-Procedure Plan Wound Cleansing: Wound #4 Left,Distal,Lateral Lower Leg: Clean wound with Normal Saline. Wound #5 Left,Posterior Lower Leg: Clean wound with Normal Saline. Skin Barriers/Peri-Wound Care: Wound #4 Left,Distal,Lateral Lower Leg: Triamcinolone Acetonide Ointment (TCA) Wound #5 Left,Posterior Lower Leg: Triamcinolone Acetonide Ointment (TCA) Dressing Change Frequency: Wound #4 Left,Distal,Lateral Lower Leg: Change dressing every week Wound #5 Left,Posterior Lower Leg: Change dressing every week Follow-up Appointments: Wound #4 Left,Distal,Lateral Lower Leg: Return Appointment in 1 week. Wound #5 Left,Posterior Lower Leg: Return Appointment in 1 week. Edema Control: Wound #4 Left,Distal,Lateral Lower Leg: 3 Layer Compression System - Left Lower Extremity Wound #5 Left,Posterior Lower Leg: 3 Layer Compression System - Left Lower Extremity General Notes: Farrow wrap 4000 1.  I would recommend at this time that we consider utilizing a Velcro compression wrap with the patient he is in agreement with the plan. 2. I am also can recommend at this time that the patient continue to be monitored for any signs of worsening infection we will keep an eye on things from that standpoint. 3. I am also can recommend that the patient continue with the triamcinolone which is using ABD pads over top of this and just a contact layer from the wrap in order to prevent anything from sticking if it may drain. We will see patient back for reevaluation in 1 week here in the clinic. If anything worsens or changes patient will contact our office for additional recommendations. Electronic Signature(s) Signed: 07/09/2020 3:39:45 PM By: Worthy Keeler  PA-C Entered By: Worthy Keeler on 07/09/2020 15:39:45 Gesell, [name] ([id number]) -------------------------------------------------------------------------------- SuperBill Details Patient Name: [name] Mole B. Date of Service: 07/09/2020 Medical Record Number: [id number] Patient Account Number: [id number] Date of Birth/Sex: 06-19-1932 (86 y.o. M) Treating RN: Cornell Barman Primary Care Provider: Lavera Guise Other Clinician: Referring Provider: Lavera Guise Treating Provider/Extender: Melburn Hake, Bellamie Turney Weeks in Treatment: 1 Diagnosis Coding ICD-10 Codes Code Description I87.332 Chronic venous hypertension (idiopathic) with ulcer and inflammation of left lower extremity L97.822 Non-pressure chronic ulcer of other part of left lower leg with fat layer exposed I70.213 Atherosclerosis of native arteries of extremities with intermittent claudication, bilateral legs Facility Procedures CPT4 Code: [id number] Description: 99213 - WOUND CARE VISIT-LEV 3 EST PT Modifier: Quantity: 1 Physician Procedures CPT4 Code Description: [id number] 99213 - WC PHYS LEVEL 3 - EST PT Modifier: Quantity: 1 CPT4 Code Description: ICD-10 Diagnosis Description I87.332 Chronic venous hypertension (idiopathic) with ulcer and inflammation of left L97.822 Non-pressure chronic ulcer of other part of left lower leg with fat layer ex A25.053 Atherosclerosis of  native arteries of extremities with intermittent claudica Modifier: lower extremity posed tion, bilateral legs Quantity: Electronic Signature(s) Signed: 07/09/2020 3:39:58 PM By: Worthy Keeler PA-C Entered By: Worthy Keeler on 07/09/2020 15:39:58

## 2020-07-14 ENCOUNTER — Encounter: Payer: PPO | Attending: Internal Medicine | Admitting: Internal Medicine

## 2020-07-14 ENCOUNTER — Other Ambulatory Visit: Payer: Self-pay

## 2020-07-14 DIAGNOSIS — I70213 Atherosclerosis of native arteries of extremities with intermittent claudication, bilateral legs: Secondary | ICD-10-CM | POA: Diagnosis not present

## 2020-07-14 DIAGNOSIS — L97222 Non-pressure chronic ulcer of left calf with fat layer exposed: Secondary | ICD-10-CM | POA: Diagnosis not present

## 2020-07-14 DIAGNOSIS — L97822 Non-pressure chronic ulcer of other part of left lower leg with fat layer exposed: Secondary | ICD-10-CM | POA: Insufficient documentation

## 2020-07-14 DIAGNOSIS — M1990 Unspecified osteoarthritis, unspecified site: Secondary | ICD-10-CM | POA: Diagnosis not present

## 2020-07-14 DIAGNOSIS — G40909 Epilepsy, unspecified, not intractable, without status epilepticus: Secondary | ICD-10-CM | POA: Diagnosis not present

## 2020-07-14 DIAGNOSIS — I120 Hypertensive chronic kidney disease with stage 5 chronic kidney disease or end stage renal disease: Secondary | ICD-10-CM | POA: Diagnosis not present

## 2020-07-14 DIAGNOSIS — I87332 Chronic venous hypertension (idiopathic) with ulcer and inflammation of left lower extremity: Secondary | ICD-10-CM | POA: Diagnosis not present

## 2020-07-14 DIAGNOSIS — N186 End stage renal disease: Secondary | ICD-10-CM | POA: Insufficient documentation

## 2020-07-16 NOTE — Progress Notes (Signed)
ROXAS, CLYMER ([id number]) Visit Report for 07/14/2020 HPI Details Patient Name: [name], [name]. Date of Service: 07/14/2020 3:15 PM Medical Record Number: [id number] Patient Account Number: [id number] Date of Birth/Sex: 09-15-1932 (86 y.o. M) Treating RN: Cornell Barman Primary Care Provider: Lavera Guise Other Clinician: Referring Provider: Lavera Guise Treating Provider/Extender: Tito Dine in Treatment: 1 History of Present Illness HPI Description: ADMISSION 03/04/2020 Patient is an 86 year old man who arrives accompanied by his wife. He states that he has had wounds on his bilateral lower extremities that started with increased swelling in his lower extremities. He has quite significant bilateral lower extremity pitting edema but he states that this is only been there for 2 or 3 weeks. Says he saw his primary doctor who put him on what sounds like a diuretic. He has developed small wounds on the left lateral which is actually the most prominent wound. Very small superficial wounds on the left anterior and a cluster of very small wounds on the right lateral. These are not particularly painful although he states that his legs are itchy. on arrival in the clinic our intake nurse could not obtain pulses bilaterally. She could not maintain of enough of a Doppler in the right dorsalis pedis to get an ABI and on the left she could not even obtain it by Doppler. The patient does have pain in his legs when he walks. He says 10 to 15 minutes he has to sit down and rest although he thought that this was because of his knees. He also mentions that his wounds are painful at night when he is lying in bed sometimes waking him up at 5:00 in the morning. Past medical history includes hypertension, CVA in 2011 with no residual effects, osteoarthritis of both knees, recent seizure disorder but apparently complete work-up was negative, ITP, vitamin B12 deficiency and stage III chronic renal failure We  could not obtain ABIs in our clinic as noted above 03/11/2020 upon evaluation today patient actually appears to be doing much better in regard to his bilateral lower extremities. Fortunately there is no signs of infection. He actually saw Dr. Dellia Nims during his initial visit last week and he was treated very appropriately his wounds appear to be doing much better. Also don't have the complete evaluation as far as his arterial study is concerned that the vascular doctor has not read it yet. With that being said it did appear that the patient's findings were consistent with being okay. His TBI's were somewhat low at 0.39 on the right and 0.49 on the left with an ABI of 1.15 on the right and 1.35 on the left. With that being said I think that the fact that he is healing and has tolerated the wrap very well at this point is a good indication that he is good to do just fine. 03/18/2020 upon evaluation today patient appears to be doing pretty well in regard to his leg ulcer on the left. Fortunately there is no signs of active infection at this time. No fevers, chills, nausea, vomiting, or diarrhea. I am actually rather pleased with how things seem to be progressing. Overall the patient's wound I think is ready for collagen I do not believe there is enough drainage to warrant having to continue with the alginate at this point. 5/13; small wound on the left lateral lower leg. We are using silver collagen. Not much improvement this week in surface area or condition of the wound. Wrapping and kerlix Coban. Previous ABI  was noncompressible 5/20; the patient's wound on the left lateral lower leg is smaller. We have been using silver collagen. I increased him from 2-3 layer compressions last week. Edema control is better Since he was last here he tells me he was in the [facility] for 48 hours with what sounds like a TIA. I have not checked these records 04/09/2020 on evaluation today patient appears to be doing  well with regard to his wound. This is measuring significantly better and seems to be doing excellent. The collagen I think is still the best option for him at this point. Fortunately there is no evidence of active infection at this time. 04/16/2020 upon evaluation today patient appears to be doing excellent in regard to his lower extremity ulcer. In fact this is measuring quite a bit smaller and overall I feel like he is doing very well in that regard. There is no signs of active infection at this time. No fevers, chills, nausea, vomiting, or diarrhea. I think he is very close to complete resolution. 04/22/2020 upon evaluation today patient actually appears to be doing excellent in regard to his leg ulcer today. In fact this appears to be completely healed based on what I am seeing. I may want to put a protective dressing over it for just a couple of days just to ensure that nothing drains or causes him any issues. Readmission: 07/02/2020 upon evaluation today patient presents for reevaluation here in our clinic concerning issues that he has been having with his left lower extremity. I did review lab work from about a week ago which showed that at that time his white blood count was at 3.8 and somewhat elevated. Right now I do not see any signs of active infection at this time which is great news. I also did review his arterial studies which were dated 03/09/2020. This showed that he did have no evidence of right or left severe peripheral vascular disease. Obviously this is good his blood flow is not perfect but nonetheless is also not severe. I do believe that he would tolerate a mild compression wrap without any complication. He tells me he also scratched the side of his leg last night. But that the area that I previously helped him heal seems to still be doing well. I did have a look at that very closely today that we detailed in the physical exam. Fortunately there is no signs of active infection at  this time which is great news. No fevers, chills, nausea, vomiting, or diarrhea. 07/09/2020 on evaluation today patient actually appears to be showing some signs of improvement in regard to his leg ulcers. Fortunately there is no evidence of active infection and a lot of the areas healed that were open last week although he has 1 new area that is open currently. [name], [name] ([id number]) Fortunately there is no signs of active infection which is great news and swelling is definitely down. 9/1; the patient arrives in clinic with no open wound on either leg. All of the wounds on the left leg have healed his edema control is good. He has bilateral Farrow wraps. Electronic Signature(s) Signed: 07/15/2020 9:14:54 AM By: Linton Ham MD Entered By: Linton Ham on 07/14/2020 16:00:34 [name], [name] ([id number]) -------------------------------------------------------------------------------- Physical Exam Details Patient Name: [name], [name]. Date of Service: 07/14/2020 3:15 PM Medical Record Number: [id number] Patient Account Number: [id number] Date of Birth/Sex: 1932-06-13 (86 y.o. M) Treating RN: Cornell Barman Primary Care Provider: Lavera Guise Other Clinician: Referring  Provider: Lavera Guise Treating Provider/Extender: Tito Dine in Treatment: 1 Constitutional Patient is hypertensive.. Pulse regular and within target range for patient.Marland Kitchen Respirations regular, non-labored and within target range.. Temperature is normal and within the target range for the patient.Marland Kitchen appears in no distress. Cardiovascular DP pulses are palpable. . Notes Wound exam; all of his wounds on the left leg appear to be epithelialized over. He has good edema control. Electronic Signature(s) Signed: 07/15/2020 9:14:54 AM By: Linton Ham MD Entered By: Linton Ham on 07/14/2020 16:04:48 [name], [name]  ([id number]) -------------------------------------------------------------------------------- Physician Orders Details Patient Name: [name], [name]. Date of Service: 07/14/2020 3:15 PM Medical Record Number: [id number] Patient Account Number: [id number] Date of Birth/Sex: Jul 25, 1932 (86 y.o. M) Treating RN: Cornell Barman Primary Care Provider: Lavera Guise Other Clinician: Referring Provider: Lavera Guise Treating Provider/Extender: Tito Dine in Treatment: 1 Verbal / Phone Orders: No Diagnosis Coding Skin Barriers/Peri-Wound Care o Moisturizing lotion - lotion daily Edema Control o Patient to wear own Velcro compression garment. Electronic Signature(s) Signed: 07/15/2020 9:14:54 AM By: Linton Ham MD Signed: 07/15/2020 6:11:40 PM By: Gretta Cool, BSN, RN, CWS, Kim RN, BSN Entered By: Gretta Cool, BSN, RN, CWS, Kim on 07/14/2020 15:59:40 Trueba, [name] ([id number]) -------------------------------------------------------------------------------- Problem List Details Patient Name: [name], [name]. Date of Service: 07/14/2020 3:15 PM Medical Record Number: [id number] Patient Account Number: [id number] Date of Birth/Sex: 1932-01-20 (86 y.o. M) Treating RN: Cornell Barman Primary Care Provider: Lavera Guise Other Clinician: Referring Provider: Lavera Guise Treating Provider/Extender: Tito Dine in Treatment: 1 Active Problems ICD-10 Encounter Code Description Active Date MDM Diagnosis I87.332 Chronic venous hypertension (idiopathic) with ulcer and inflammation of 07/02/2020 No Yes left lower extremity L97.822 Non-pressure chronic ulcer of other part of left lower leg with fat layer 07/02/2020 No Yes exposed I70.213 Atherosclerosis of native arteries of extremities with intermittent 07/02/2020 No Yes claudication, bilateral legs Inactive Problems Resolved Problems Electronic Signature(s) Signed: 07/15/2020 9:14:54 AM By: Linton Ham MD Entered By: Linton Ham on  07/14/2020 15:59:18 [place], [name] ([id number]) -------------------------------------------------------------------------------- Progress Note Details Patient Name: [name]. Date of Service: 07/14/2020 3:15 PM Medical Record Number: [id number] Patient Account Number: [id number] Date of Birth/Sex: [birth year] (86 y.o. M) Treating RN: Cornell Barman Primary Care Provider: Lavera Guise Other Clinician: Referring Provider: Lavera Guise Treating Provider/Extender: Tito Dine in Treatment: 1 Subjective History of Present Illness (HPI) ADMISSION 03/04/2020 Patient is an 86 year old man who arrives accompanied by his wife. He states that he has had wounds on his bilateral lower extremities that started with increased swelling in his lower extremities. He has quite significant bilateral lower extremity pitting edema but he states that this is only been there for 2 or 3 weeks. Says he saw his primary doctor who put him on what sounds like a diuretic. He has developed small wounds on the left lateral which is actually the most prominent wound. Very small superficial wounds on the left anterior and a cluster of very small wounds on the right lateral. These are not particularly painful although he states that his legs are itchy. on arrival in the clinic our intake nurse could not obtain pulses bilaterally. She could not maintain of enough of a Doppler in the right dorsalis pedis to get an ABI and on the left she could not even obtain it by Doppler. The patient does have pain in his legs when he walks. He says 10 to 15 minutes he has to sit down and rest although he thought  that this was because of his knees. He also mentions that his wounds are painful at night when he is lying in bed sometimes waking him up at 5:00 in the morning. Past medical history includes hypertension, CVA in 2011 with no residual effects, osteoarthritis of both knees, recent seizure disorder but apparently complete  work-up was negative, ITP, vitamin B12 deficiency and stage III chronic renal failure We could not obtain ABIs in our clinic as noted above 03/11/2020 upon evaluation today patient actually appears to be doing much better in regard to his bilateral lower extremities. Fortunately there is no signs of infection. He actually saw Dr. Dellia Nims during his initial visit last week and he was treated very appropriately his wounds appear to be doing much better. Also don't have the complete evaluation as far as his arterial study is concerned that the vascular doctor has not read it yet. With that being said it did appear that the patient's findings were consistent with being okay. His TBI's were somewhat low at 0.39 on the right and 0.49 on the left with an ABI of 1.15 on the right and 1.35 on the left. With that being said I think that the fact that he is healing and has tolerated the wrap very well at this point is a good indication that he is good to do just fine. 03/18/2020 upon evaluation today patient appears to be doing pretty well in regard to his leg ulcer on the left. Fortunately there is no signs of active infection at this time. No fevers, chills, nausea, vomiting, or diarrhea. I am actually rather pleased with how things seem to be progressing. Overall the patient's wound I think is ready for collagen I do not believe there is enough drainage to warrant having to continue with the alginate at this point. 5/13; small wound on the left lateral lower leg. We are using silver collagen. Not much improvement this week in surface area or condition of the wound. Wrapping and kerlix Coban. Previous ABI was noncompressible 5/20; the patient's wound on the left lateral lower leg is smaller. We have been using silver collagen. I increased him from 2-3 layer compressions last week. Edema control is better Since he was last here he tells me he was in the [facility] for 48 hours with what sounds like a TIA. I  have not checked these records 04/09/2020 on evaluation today patient appears to be doing well with regard to his wound. This is measuring significantly better and seems to be doing excellent. The collagen I think is still the best option for him at this point. Fortunately there is no evidence of active infection at this time. 04/16/2020 upon evaluation today patient appears to be doing excellent in regard to his lower extremity ulcer. In fact this is measuring quite a bit smaller and overall I feel like he is doing very well in that regard. There is no signs of active infection at this time. No fevers, chills, nausea, vomiting, or diarrhea. I think he is very close to complete resolution. 04/22/2020 upon evaluation today patient actually appears to be doing excellent in regard to his leg ulcer today. In fact this appears to be completely healed based on what I am seeing. I may want to put a protective dressing over it for just a couple of days just to ensure that nothing drains or causes him any issues. Readmission: 07/02/2020 upon evaluation today patient presents for reevaluation here in our clinic concerning issues that he has  been having with his left lower extremity. I did review lab work from about a week ago which showed that at that time his white blood count was at 3.8 and somewhat elevated. Right now I do not see any signs of active infection at this time which is great news. I also did review his arterial studies which were dated 03/09/2020. This showed that he did have no evidence of right or left severe peripheral vascular disease. Obviously this is good his blood flow is not perfect but nonetheless is also not severe. I do believe that he would tolerate a mild compression wrap without any complication. He tells me he also scratched the side of his leg last night. But that the area that I previously helped him heal seems to still be doing well. I did have a look at that very closely today  that we detailed in the physical exam. Fortunately there is no signs of active infection at this time which is great news. No fevers, chills, nausea, vomiting, or diarrhea. 07/09/2020 on evaluation today patient actually appears to be showing some signs of improvement in regard to his leg ulcers. Fortunately there is no evidence of active infection and a lot of the areas healed that were open last week although he has 1 new area that is open currently. Fortunately there is no signs of active infection which is great news and swelling is definitely down. 9/1; the patient arrives in clinic with no open wound on either leg. All of the wounds on the left leg have healed his edema control is good. He has bilateral Farrow wraps. [name], [name] ([id number]) Objective Constitutional Patient is hypertensive.. Pulse regular and within target range for patient.Marland Kitchen Respirations regular, non-labored and within target range.. Temperature is normal and within the target range for the patient.Marland Kitchen appears in no distress. Vitals Time Taken: 3:56 PM, Height: 70 in, Weight: 179 lbs, BMI: 25.7, Temperature: 97.8 F, Pulse: 69 bpm, Respiratory Rate: 18 breaths/min, Blood Pressure: 161/73 mmHg. Cardiovascular DP pulses are palpable. General Notes: Wound exam; all of his wounds on the left leg appear to be epithelialized over. He has good edema control. Integumentary (Hair, Skin) Wound #4 status is Healed - Epithelialized. Original cause of wound was Gradually Appeared. The wound is located on the Left,Distal,Lateral Lower Leg. The wound measures 0cm length x 0cm width x 0cm depth; 0cm^2 area and 0cm^3 volume. There is Fat Layer (Subcutaneous Tissue) exposed. There is a medium amount of serosanguineous drainage noted. The wound margin is flat and intact. There is no granulation within the wound bed. There is a large (67-100%) amount of necrotic tissue within the wound bed including Adherent Slough. Wound #5 status is  Healed - Epithelialized. Original cause of wound was Trauma. The wound is located on the Left,Posterior Lower Leg. The wound measures 0cm length x 0cm width x 0cm depth; 0cm^2 area and 0cm^3 volume. There is Fat Layer (Subcutaneous Tissue) exposed. There is a medium amount of serous drainage noted. The wound margin is distinct with the outline attached to the wound base. There is no granulation within the wound bed. There is no necrotic tissue within the wound bed. Wound #6 status is Healed - Epithelialized. Original cause of wound was Gradually Appeared. The wound is located on the Left,Proximal,Lateral Lower Leg. The wound measures 0cm length x 0cm width x 0cm depth; 0cm^2 area and 0cm^3 volume. There is Fat Layer (Subcutaneous Tissue) exposed. There is a medium amount of serosanguineous drainage noted. The  wound margin is distinct with the outline attached to the wound base. There is small (1-33%) red granulation within the wound bed. There is a large (67-100%) amount of necrotic tissue within the wound bed including Adherent Slough. Assessment Active Problems ICD-10 Chronic venous hypertension (idiopathic) with ulcer and inflammation of left lower extremity Non-pressure chronic ulcer of other part of left lower leg with fat layer exposed Atherosclerosis of native arteries of extremities with intermittent claudication, bilateral legs Plan Skin Barriers/Peri-Wound Care: Moisturizing lotion - lotion daily Edema Control: Patient to wear own Velcro compression garment. 1. Patient is discharged to his own Farrow wrap stockings 2. I went through the daily routine here including keeping his leg elevated and moisturizing his skin at night. Both the patient and his wife expressed understanding 3. Follow-up with [place] as needed if wounds recur [name], [name] ([id number]) Electronic Signature(s) Signed: 07/15/2020 9:14:54 AM By: Linton Ham MD Entered By: Linton Ham on 07/14/2020  16:05:42 [name], [name] ([id number]) -------------------------------------------------------------------------------- SuperBill Details Patient Name: [name], [name]. Date of Service: 07/14/2020 Medical Record Number: [id number] Patient Account Number: [id number] Date of Birth/Sex: 12-08-1931 (86 y.o. M) Treating RN: Cornell Barman Primary Care Provider: Lavera Guise Other Clinician: Referring Provider: Lavera Guise Treating Provider/Extender: Tito Dine in Treatment: 1 Diagnosis Coding ICD-10 Codes Code Description [phone] Chronic venous hypertension (idiopathic) with ulcer and inflammation of left lower extremity L97.822 Non-pressure chronic ulcer of other part of left lower leg with fat layer exposed I70.213 Atherosclerosis of native arteries of extremities with intermittent claudication, bilateral legs Facility Procedures CPT4 Code: [id number] Description: 08022 - WOUND CARE VISIT-LEV 2 EST PT Modifier: Quantity: 1 Physician Procedures CPT4 Code Description: [id number] - WC PHYS LEVEL 2 - EST PT Modifier: Quantity: 1 CPT4 Code Description: ICD-10 Diagnosis Description I87.332 Chronic venous hypertension (idiopathic) with ulcer and inflammation of left L97.822 Non-pressure chronic ulcer of other part of left lower leg with fat layer ex Modifier: lower extremity posed Quantity: Electronic Signature(s) Signed: 07/15/2020 9:14:54 AM By: Linton Ham MD Entered By: Linton Ham on 07/14/2020 00:51:10

## 2020-07-16 NOTE — Progress Notes (Signed)
ROGEN, PORTE ([id number]) Visit Report for 07/14/2020 Arrival Information Details Patient Name: [name], [name]. Date of Service: 07/14/2020 3:15 PM Medical Record Number: [id number] Patient Account Number: [id number] Date of Birth/Sex: 24-Mar-1932 (86 y.o. M) Treating RN: Grover Canavan Primary Care Salih Williamson: Lavera Guise Other Clinician: Referring Zela Sobieski: Lavera Guise Treating Bella Brummet/Extender: Tito Dine in Treatment: 1 Visit Information History Since Last Visit Added or deleted any medications: No Patient Arrived: Ambulatory Had a fall or experienced change in No Arrival Time: 15:25 activities of daily living that may affect Accompanied By: wife risk of falls: Transfer Assistance: None Hospitalized since last visit: No Patient Requires Transmission-Based No Pain Present Now: No Precautions: Patient Has Alerts: Yes Patient Alerts: 03/16/20 ABI R 1.15, L 1.35 Electronic Signature(s) Signed: 07/14/2020 4:49:26 PM By: Grover Canavan Entered By: Grover Canavan on 07/14/2020 15:26:38 Oviatt, [name] ([id number]) -------------------------------------------------------------------------------- Clinic Level of Care Assessment Details Patient Name: [name] Mole B. Date of Service: 07/14/2020 3:15 PM Medical Record Number: [id number] Patient Account Number: [id number] Date of Birth/Sex: December 14, 1931 (86 y.o. M) Treating RN: Cornell Barman Primary Care Coila Wardell: Lavera Guise Other Clinician: Referring Rocio Roam: Lavera Guise Treating Jahara Dail/Extender: Tito Dine in Treatment: 1 Clinic Level of Care Assessment Items TOOL 4 Quantity Score []  - Use when only an EandM is performed on FOLLOW-UP visit 0 ASSESSMENTS - Nursing Assessment / Reassessment X - Reassessment of Co-morbidities (includes updates in patient status) 1 10 X- 1 5 Reassessment of Adherence to Treatment Plan ASSESSMENTS - Wound and Skin Assessment / Reassessment []  - Simple Wound Assessment /  Reassessment - one wound 0 []  - 0 Complex Wound Assessment / Reassessment - multiple wounds []  - 0 Dermatologic / Skin Assessment (not related to wound area) ASSESSMENTS - Focused Assessment []  - Circumferential Edema Measurements - multi extremities 0 []  - 0 Nutritional Assessment / Counseling / Intervention []  - 0 Lower Extremity Assessment (monofilament, tuning fork, pulses) []  - 0 Peripheral Arterial Disease Assessment (using hand held doppler) ASSESSMENTS - Ostomy and/or Continence Assessment and Care []  - Incontinence Assessment and Management 0 []  - 0 Ostomy Care Assessment and Management (repouching, etc.) PROCESS - Coordination of Care X - Simple Patient / Family Education for ongoing care 1 15 []  - 0 Complex (extensive) Patient / Family Education for ongoing care []  - 0 Staff obtains Programmer, systems, Records, Test Results / Process Orders []  - 0 Staff telephones HHA, Nursing Homes / Clarify orders / etc []  - 0 Routine Transfer to another Facility (non-emergent condition) []  - 0 Routine Hospital Admission (non-emergent condition) []  - 0 New Admissions / Biomedical engineer / Ordering NPWT, Apligraf, etc. []  - 0 Emergency Hospital Admission (emergent condition) []  - 0 Simple Discharge Coordination []  - 0 Complex (extensive) Discharge Coordination PROCESS - Special Needs []  - Pediatric / Minor Patient Management 0 []  - 0 Isolation Patient Management []  - 0 Hearing / Language / Visual special needs []  - 0 Assessment of Community assistance (transportation, D/C planning, etc.) []  - 0 Additional assistance / Altered mentation []  - 0 Support Surface(s) Assessment (bed, cushion, seat, etc.) INTERVENTIONS - Wound Cleansing / Measurement [name], [name] ([id number]) X- 1 5 Simple Wound Cleansing - one wound []  - 0 Complex Wound Cleansing - multiple wounds X- 1 5 Wound Imaging (photographs - any number of wounds) []  - 0 Wound Tracing (instead of photographs) X-  1 5 Simple Wound Measurement - one wound []  - 0 Complex Wound Measurement - multiple wounds INTERVENTIONS - Wound Dressings []  -  Small Wound Dressing one or multiple wounds 0 []  - 0 Medium Wound Dressing one or multiple wounds []  - 0 Large Wound Dressing one or multiple wounds []  - 0 Application of Medications - topical []  - 0 Application of Medications - injection INTERVENTIONS - Miscellaneous []  - External ear exam 0 []  - 0 Specimen Collection (cultures, biopsies, blood, body fluids, etc.) []  - 0 Specimen(s) / Culture(s) sent or taken to Lab for analysis []  - 0 Patient Transfer (multiple staff / Civil Service fast streamer / Similar devices) []  - 0 Simple Staple / Suture removal (25 or less) []  - 0 Complex Staple / Suture removal (26 or more) []  - 0 Hypo / Hyperglycemic Management (close monitor of Blood Glucose) []  - 0 Ankle / Brachial Index (ABI) - do not check if billed separately X- 1 5 Vital Signs Has the patient been seen at the hospital within the last three years: Yes Total Score: 50 Level Of Care: New/Established - Level 2 Electronic Signature(s) Signed: 07/15/2020 6:11:40 PM By: Gretta Cool, BSN, RN, CWS, Kim RN, BSN Entered By: Gretta Cool, BSN, RN, CWS, Kim on 07/14/2020 16:00:17 [name], [name] ([id number]) -------------------------------------------------------------------------------- Encounter Discharge Information Details Patient Name: [name] Mole B. Date of Service: 07/14/2020 3:15 PM Medical Record Number: [id number] Patient Account Number: [id number] Date of Birth/Sex: 12-28-1931 (86 y.o. M) Treating RN: Cornell Barman Primary Care Saafir Abdullah: Lavera Guise Other Clinician: Referring Shemeika Starzyk: Lavera Guise Treating Tenee Wish/Extender: Tito Dine in Treatment: 1 Encounter Discharge Information Items Discharge Condition: Stable Ambulatory Status: Ambulatory Discharge Destination: Home Transportation: Private Auto Accompanied By: self Schedule Follow-up Appointment:  No Clinical Summary of Care: Electronic Signature(s) Signed: 07/15/2020 6:11:40 PM By: Gretta Cool, BSN, RN, CWS, Kim RN, BSN Entered By: Gretta Cool, BSN, RN, CWS, Kim on 07/14/2020 16:01:20 Prichard, [name] ([id number]) -------------------------------------------------------------------------------- Lower Extremity Assessment Details Patient Name: [name], [name] Date of Service: 07/14/2020 3:15 PM Medical Record Number: [id number] Patient Account Number: [id number] Date of Birth/Sex: January 02, 1932 (86 y.o. M) Treating RN: Grover Canavan Primary Care Yuvonne Lanahan: Lavera Guise Other Clinician: Referring Ivie Savitt: Lavera Guise Treating Nare Gaspari/Extender: Ricard Dillon Weeks in Treatment: 1 Edema Assessment Assessed: [Left: Yes] [Right: No] Edema: [Left: Ye] [Right: s] Calf Left: Right: Point of Measurement: 29 cm From Medial Instep 33 cm cm Ankle Left: Right: Point of Measurement: 9 cm From Medial Instep 26 cm cm Vascular Assessment Pulses: Dorsalis Pedis Palpable: [Left:Yes] Posterior Tibial Palpable: [Left:Yes] Electronic Signature(s) Signed: 07/14/2020 4:49:26 PM By: Grover Canavan Entered By: Grover Canavan on 07/14/2020 15:50:47 [name], [name] ([id number]) -------------------------------------------------------------------------------- Multi Wound Chart Details Patient Name: [name] Mole B. Date of Service: 07/14/2020 3:15 PM Medical Record Number: [id number] Patient Account Number: [id number] Date of Birth/Sex: 03-Aug-1932 (86 y.o. M) Treating RN: Cornell Barman Primary Care Kobe Ofallon: Lavera Guise Other Clinician: Referring Lyam Provencio: Lavera Guise Treating Jennene Downie/Extender: Tito Dine in Treatment: 1 Vital Signs Height(in): 70 Pulse(bpm): 18 Weight(lbs): 179 Blood Pressure(mmHg): 161/73 Body Mass Index(BMI): 26 Temperature(F): 97.8 Respiratory Rate(breaths/min): 18 Photos: Wound Location: Left, Distal, Lateral Lower Leg Left, Posterior Lower Leg Left, Proximal, Lateral  Lower Leg Wounding Event: Gradually Appeared Trauma Gradually Appeared Primary Etiology: Trauma, Other Trauma, Other Venous Leg Ulcer Comorbid History: Hypertension, End Stage Renal Hypertension, End Stage Renal Hypertension, End Stage Renal Disease, Osteoarthritis, Seizure Disease, Osteoarthritis, Seizure Disease, Osteoarthritis, Seizure Disorder Disorder Disorder Date Acquired: 02/02/2020 07/02/2020 07/09/2020 Weeks of Treatment: 1 1 0 Wound Status: Healed - Epithelialized Healed - Epithelialized Healed - Epithelialized Measurements L x W x D (cm) 0x0x0 0x0x0 0x0x0  Area (cm) : 0 0 0 Volume (cm) : 0 0 0 % Reduction in Area: 100.00% 100.00% 100.00% % Reduction in Volume: 100.00% 100.00% 100.00% Classification: Partial Thickness Partial Thickness Partial Thickness Exudate Amount: Medium Medium Medium Exudate Type: Serosanguineous Serous Serosanguineous Exudate Color: red, brown amber red, brown Wound Margin: Flat and Intact Distinct, outline attached Distinct, outline attached Granulation Amount: None Present (0%) None Present (0%) Small (1-33%) Granulation Quality: N/A N/A Red Necrotic Amount: Large (67-100%) None Present (0%) Large (67-100%) Exposed Structures: Fat Layer (Subcutaneous Tissue): Fat Layer (Subcutaneous Tissue): Fat Layer (Subcutaneous Tissue): Yes Yes Yes Fascia: No Fascia: No Fascia: No Tendon: No Tendon: No Tendon: No Muscle: No Muscle: No Muscle: No Joint: No Joint: No Joint: No Bone: No Bone: No Bone: No Epithelialization: None None Small (1-33%) Treatment Notes Electronic Signature(s) Signed: 07/15/2020 9:14:54 AM By: Linton Ham MD Entered By: Linton Ham on 07/14/2020 15:59:29 [name], [name] ([id number]) -------------------------------------------------------------------------------- Multi-Disciplinary Care Plan Details Patient Name: FERREL, SIMINGTON B. Date of Service: 07/14/2020 3:15 PM Medical Record Number: [id number] Patient Account  Number: [id number] Date of Birth/Sex: 03-12-1932 (86 y.o. M) Treating RN: Cornell Barman Primary Care Shonteria Abeln: Lavera Guise Other Clinician: Referring Liela Rylee: Lavera Guise Treating Kealy Lewter/Extender: Tito Dine in Treatment: 1 Active Inactive Electronic Signature(s) Signed: 07/15/2020 6:11:40 PM By: Gretta Cool, BSN, RN, CWS, Kim RN, BSN Entered By: Gretta Cool, BSN, RN, CWS, Kim on 07/14/2020 15:58:28 [name], [name] ([id number]) -------------------------------------------------------------------------------- Pain Assessment Details Patient Name: [name], [name] Date of Service: 07/14/2020 3:15 PM Medical Record Number: [id number] Patient Account Number: [id number] Date of Birth/Sex: [birth year] (86 y.o. M) Treating RN: Cornell Barman Primary Care Allan Bacigalupi: Lavera Guise Other Clinician: Referring Jerrad Mendibles: Lavera Guise Treating Chantille Navarrete/Extender: Tito Dine in Treatment: 1 Active Problems Location of Pain Severity and Description of Pain Patient Has Paino No Site Locations Pain Management and Medication Current Pain Management: Electronic Signature(s) Signed: 07/15/2020 6:11:40 PM By: Gretta Cool, BSN, RN, CWS, Kim RN, BSN Entered By: Gretta Cool, BSN, RN, CWS, Kim on 07/14/2020 15:57:25 [place]sebrock, [name] ([id number]) -------------------------------------------------------------------------------- Patient/Caregiver Education Details Patient Name: [name] Date of Service: 07/14/2020 3:15 PM Medical Record Number: [id number] Patient Account Number: [id number] Date of Birth/Gender: [birth year] (86 y.o. M) Treating RN: Cornell Barman Primary Care Physician: Lavera Guise Other Clinician: Referring Physician: Lavera Guise Treating Physician/Extender: Tito Dine in Treatment: 1 Education Assessment Education Provided To: Patient Education Topics Provided Wound/Skin Impairment: Handouts: Caring for Your Ulcer, Other: wear stockings daily Electronic Signature(s) Signed: 07/15/2020  6:11:40 PM By: Gretta Cool, BSN, RN, CWS, Kim RN, BSN Entered By: Gretta Cool, BSN, RN, CWS, Kim on 07/14/2020 16:00:55 [name], [name] ([id number]) -------------------------------------------------------------------------------- Wound Assessment Details Patient Name: [name], [name] Date of Service: 07/14/2020 3:15 PM Medical Record Number: [id number] Patient Account Number: [id number] Date of Birth/Sex: [birth year] (86 y.o. M) Treating RN: Cornell Barman Primary Care Duc Crocket: Lavera Guise Other Clinician: Referring Lynnell Fiumara: Lavera Guise Treating Krayton Wortley/Extender: Tito Dine in Treatment: 1 Wound Status Wound Number: 4 Primary Trauma, Other Etiology: Wound Location: Left, Distal, Lateral Lower Leg Wound Status: Healed - Epithelialized Wounding Event: Gradually Appeared Comorbid Hypertension, End Stage Renal Disease, Osteoarthritis, Date Acquired: 02/02/2020 History: Seizure Disorder Weeks Of Treatment: 1 Clustered Wound: No Photos Wound Measurements Length: (cm) 0 Width: (cm) 0 Depth: (cm) 0 Area: (cm) 0 Volume: (cm) 0 % Reduction in Area: 100% % Reduction in Volume: 100% Epithelialization: None Wound Description Classification: Partial Thickness Wound Margin: Flat and Intact Exudate Amount: Medium Exudate Type: Serosanguineous Exudate  Color: red, brown Foul Odor After Cleansing: No Slough/Fibrino Yes Wound Bed Granulation Amount: None Present (0%) Exposed Structure Necrotic Amount: Large (67-100%) Fascia Exposed: No Necrotic Quality: Adherent Slough Fat Layer (Subcutaneous Tissue) Exposed: Yes Tendon Exposed: No Muscle Exposed: No Joint Exposed: No Bone Exposed: No Electronic Signature(s) Signed: 07/15/2020 6:11:40 PM By: Gretta Cool, BSN, RN, CWS, Kim RN, BSN Entered By: Gretta Cool, BSN, RN, CWS, Kim on 07/14/2020 15:57:39 [name], [name] ([id number]) -------------------------------------------------------------------------------- Wound Assessment Details Patient Name: [name] Mole B. Date of Service: 07/14/2020 3:15 PM Medical Record Number: [id number] Patient Account Number: [id number] Date of Birth/Sex: 04/29/1932 (86 y.o. M) Treating RN: Cornell Barman Primary Care Hildegard Hlavac: Lavera Guise Other Clinician: Referring Iman Reinertsen: Lavera Guise Treating Hendy Brindle/Extender: Tito Dine in Treatment: 1 Wound Status Wound Number: 5 Primary Trauma, Other Etiology: Wound Location: Left, Posterior Lower Leg Wound Status: Healed - Epithelialized Wounding Event: Trauma Comorbid Hypertension, End Stage Renal Disease, Osteoarthritis, Date Acquired: 07/02/2020 History: Seizure Disorder Weeks Of Treatment: 1 Clustered Wound: No Photos Wound Measurements Length: (cm) 0 Width: (cm) 0 Depth: (cm) 0 Area: (cm) 0 Volume: (cm) 0 % Reduction in Area: 100% % Reduction in Volume: 100% Epithelialization: None Wound Description Classification: Partial Thickness Wound Margin: Distinct, outline attached Exudate Amount: Medium Exudate Type: Serous Exudate Color: amber Foul Odor After Cleansing: No Slough/Fibrino No Wound Bed Granulation Amount: None Present (0%) Exposed Structure Necrotic Amount: None Present (0%) Fascia Exposed: No Fat Layer (Subcutaneous Tissue) Exposed: Yes Tendon Exposed: No Muscle Exposed: No Joint Exposed: No Bone Exposed: No Electronic Signature(s) Signed: 07/15/2020 6:11:40 PM By: Gretta Cool, BSN, RN, CWS, Kim RN, BSN Entered By: Gretta Cool, BSN, RN, CWS, Kim on 07/14/2020 15:57:55 [name], [name] ([id number]) -------------------------------------------------------------------------------- Wound Assessment Details Patient Name: [name] Mole B. Date of Service: 07/14/2020 3:15 PM Medical Record Number: [id number] Patient Account Number: [id number] Date of Birth/Sex: 04/12/1932 (86 y.o. M) Treating RN: Cornell Barman Primary Care Sarya Linenberger: Lavera Guise Other Clinician: Referring Leonora Gores: Lavera Guise Treating Tessa Seaberry/Extender: Tito Dine  in Treatment: 1 Wound Status Wound Number: 6 Primary Venous Leg Ulcer Etiology: Wound Location: Left, Proximal, Lateral Lower Leg Wound Status: Healed - Epithelialized Wounding Event: Gradually Appeared Comorbid Hypertension, End Stage Renal Disease, Osteoarthritis, Date Acquired: 07/09/2020 History: Seizure Disorder Weeks Of Treatment: 0 Clustered Wound: No Photos Wound Measurements Length: (cm) 0 Width: (cm) 0 Depth: (cm) 0 Area: (cm) 0 Volume: (cm) 0 % Reduction in Area: 100% % Reduction in Volume: 100% Epithelialization: Small (1-33%) Wound Description Classification: Partial Thickness Wound Margin: Distinct, outline attached Exudate Amount: Medium Exudate Type: Serosanguineous Exudate Color: red, brown Foul Odor After Cleansing: No Slough/Fibrino No Wound Bed Granulation Amount: Small (1-33%) Exposed Structure Granulation Quality: Red Fascia Exposed: No Necrotic Amount: Large (67-100%) Fat Layer (Subcutaneous Tissue) Exposed: Yes Necrotic Quality: Adherent Slough Tendon Exposed: No Muscle Exposed: No Joint Exposed: No Bone Exposed: No Electronic Signature(s) Signed: 07/15/2020 6:11:40 PM By: Gretta Cool, BSN, RN, CWS, Kim RN, BSN Entered By: Gretta Cool, BSN, RN, CWS, Kim on 07/14/2020 15:57:56 Kramlich, [name] ([id number]) -------------------------------------------------------------------------------- [place] Details Patient Name: [name] Mole B. Date of Service: 07/14/2020 3:15 PM Medical Record Number: [id number] Patient Account Number: [id number] Date of Birth/Sex: 09-27-1932 (86 y.o. M) Treating RN: Cornell Barman Primary Care Deatrice Spanbauer: Lavera Guise Other Clinician: Referring Melanee Cordial: Lavera Guise Treating Ruthia Person/Extender: Tito Dine in Treatment: 1 Vital Signs Time Taken: 15:56 Temperature (F): 97.8 Height (in): 70 Pulse (bpm): 69 Weight (lbs): 179 Respiratory Rate (breaths/min): 18 Body Mass Index (BMI): 25.7 Blood  Pressure (mmHg):  161/73 Reference Range: 80 - 120 mg / dl Electronic Signature(s) Signed: 07/15/2020 6:11:40 PM By: Gretta Cool, BSN, RN, CWS, Kim RN, BSN Entered By: Gretta Cool, BSN, RN, CWS, Kim on 07/14/2020 15:57:19

## 2020-08-10 DIAGNOSIS — R569 Unspecified convulsions: Secondary | ICD-10-CM | POA: Diagnosis not present

## 2020-08-10 DIAGNOSIS — Z8673 Personal history of transient ischemic attack (TIA), and cerebral infarction without residual deficits: Secondary | ICD-10-CM | POA: Diagnosis not present

## 2020-08-18 DIAGNOSIS — M170 Bilateral primary osteoarthritis of knee: Secondary | ICD-10-CM | POA: Diagnosis not present

## 2020-08-18 DIAGNOSIS — D696 Thrombocytopenia, unspecified: Secondary | ICD-10-CM | POA: Diagnosis not present

## 2020-08-18 DIAGNOSIS — Z6828 Body mass index (BMI) 28.0-28.9, adult: Secondary | ICD-10-CM | POA: Diagnosis not present

## 2020-08-18 DIAGNOSIS — Z23 Encounter for immunization: Secondary | ICD-10-CM | POA: Diagnosis not present

## 2020-09-21 ENCOUNTER — Encounter: Payer: Self-pay | Admitting: Internal Medicine

## 2020-09-21 ENCOUNTER — Other Ambulatory Visit: Payer: Self-pay

## 2020-09-21 ENCOUNTER — Inpatient Hospital Stay: Payer: PPO | Attending: Internal Medicine

## 2020-09-21 ENCOUNTER — Inpatient Hospital Stay (HOSPITAL_BASED_OUTPATIENT_CLINIC_OR_DEPARTMENT_OTHER): Payer: PPO | Admitting: Internal Medicine

## 2020-09-21 DIAGNOSIS — Z79899 Other long term (current) drug therapy: Secondary | ICD-10-CM | POA: Diagnosis not present

## 2020-09-21 DIAGNOSIS — Z885 Allergy status to narcotic agent status: Secondary | ICD-10-CM | POA: Insufficient documentation

## 2020-09-21 DIAGNOSIS — D631 Anemia in chronic kidney disease: Secondary | ICD-10-CM | POA: Insufficient documentation

## 2020-09-21 DIAGNOSIS — I129 Hypertensive chronic kidney disease with stage 1 through stage 4 chronic kidney disease, or unspecified chronic kidney disease: Secondary | ICD-10-CM | POA: Insufficient documentation

## 2020-09-21 DIAGNOSIS — D693 Immune thrombocytopenic purpura: Secondary | ICD-10-CM

## 2020-09-21 DIAGNOSIS — Z9049 Acquired absence of other specified parts of digestive tract: Secondary | ICD-10-CM | POA: Insufficient documentation

## 2020-09-21 DIAGNOSIS — N183 Chronic kidney disease, stage 3 unspecified: Secondary | ICD-10-CM | POA: Insufficient documentation

## 2020-09-21 DIAGNOSIS — Z8379 Family history of other diseases of the digestive system: Secondary | ICD-10-CM | POA: Diagnosis not present

## 2020-09-21 DIAGNOSIS — R569 Unspecified convulsions: Secondary | ICD-10-CM | POA: Insufficient documentation

## 2020-09-21 DIAGNOSIS — Z8249 Family history of ischemic heart disease and other diseases of the circulatory system: Secondary | ICD-10-CM | POA: Insufficient documentation

## 2020-09-21 DIAGNOSIS — Z8673 Personal history of transient ischemic attack (TIA), and cerebral infarction without residual deficits: Secondary | ICD-10-CM | POA: Insufficient documentation

## 2020-09-21 LAB — BASIC METABOLIC PANEL
Anion gap: 9 (ref 5–15)
BUN: 20 mg/dL (ref 8–23)
CO2: 26 mmol/L (ref 22–32)
Calcium: 7.7 mg/dL — ABNORMAL LOW (ref 8.9–10.3)
Chloride: 105 mmol/L (ref 98–111)
Creatinine, Ser: 1.3 mg/dL — ABNORMAL HIGH (ref 0.61–1.24)
GFR, Estimated: 54 mL/min — ABNORMAL LOW (ref >60)
Glucose, Bld: 90 mg/dL (ref 70–99)
Potassium: 3.9 mmol/L (ref 3.5–5.1)
Sodium: 140 mmol/L (ref 135–145)

## 2020-09-21 LAB — CBC WITH DIFFERENTIAL/PLATELET
Abs Immature Granulocytes: 0.02 10*3/uL (ref 0.00–0.07)
Basophils Absolute: 0 10*3/uL (ref 0.0–0.1)
Basophils Relative: 0 %
Eosinophils Absolute: 0.2 10*3/uL (ref 0.0–0.5)
Eosinophils Relative: 3 %
HCT: 32.1 % — ABNORMAL LOW (ref 39.0–52.0)
Hemoglobin: 10.7 g/dL — ABNORMAL LOW (ref 13.0–17.0)
Immature Granulocytes: 0 %
Lymphocytes Relative: 15 %
Lymphs Abs: 1 10*3/uL (ref 0.7–4.0)
MCH: 29.2 pg (ref 26.0–34.0)
MCHC: 33.3 g/dL (ref 30.0–36.0)
MCV: 87.5 fL (ref 80.0–100.0)
Monocytes Absolute: 0.7 10*3/uL (ref 0.1–1.0)
Monocytes Relative: 10 %
Neutro Abs: 4.9 10*3/uL (ref 1.7–7.7)
Neutrophils Relative %: 72 %
Platelets: 135 10*3/uL — ABNORMAL LOW (ref 150–400)
RBC: 3.67 MIL/uL — ABNORMAL LOW (ref 4.22–5.81)
RDW: 15.3 % (ref 11.5–15.5)
WBC: 6.8 10*3/uL (ref 4.0–10.5)
nRBC: 0 % (ref 0.0–0.2)

## 2020-09-21 LAB — IRON AND TIBC
Iron: 46 ug/dL (ref 45–182)
Saturation Ratios: 19 % (ref 17.9–39.5)
TIBC: 239 ug/dL — ABNORMAL LOW (ref 250–450)
UIBC: 193 ug/dL

## 2020-09-21 LAB — FERRITIN: Ferritin: 16 ng/mL — ABNORMAL LOW (ref 24–336)

## 2020-09-21 NOTE — Patient Instructions (Signed)
#  Recommend iron pill 65 mg [over-the-counter] once a day; if constipated recommend MiraLAX/stool softener once a day.  #Given low calcium-recommend calcium plus vitamin D 1 pill twice a day [over-the-counter]

## 2020-09-21 NOTE — Assessment & Plan Note (Addendum)
#   ITP-Steroid responsive.  Currently on surveillance.overall STABLE; Platelets of 135; Continue surveillance.   # IDA-  Mild anemia/likely from CKD- Hb 10.7; non-compliant with PO iron. Recommend PO iron.  STABLE. Re-check Iron levels.  Recommend p.o. iron.  # Hypocalcemia ca- 7.7; recommend ca+ vit D BID.   # CKD-stage III [GFR-54]  STABLE.   #Seizures -stable; on keppra.   #  DISPOSITION: check Iron studies/ ferritin # follow up in 6 months- MD;  [cbcbmp/]- Dr.B   Dr.Bliss

## 2020-09-21 NOTE — Progress Notes (Signed)
[facility] OFFICE PROGRESS NOTE  Patient Care Team: Lynnell Jude, MD as PCP - General (Family Medicine) Kate Sable, MD as PCP - Cardiology (Cardiology) Cammie Sickle, MD as Consulting Physician (Hematology and Oncology)   SUMMARY OF ONCOLOGIC HISTORY:  # SEP 2015- ITP [platelets- 16]; s/p prednisone ; Good Response; currently surveillance  # SEP 2015- Mild Anemia/IDA-CKD-III- ~12 s/p EGD/colo [Oct 2015; Dr.Skulskie] ; Ferritin- 17; NO BMBx;   # CKD creatinine ~1.4 [no nephro] ; 2021-new onset of Seziues   INTERVAL HISTORY:  86 year old male patient with history of ITP-is here for follow-up/currently on surveillance.  In the interim patient had episode of seizures-currently on Keppra.  No seizures since being on Keppra.  No blood in stools no black-colored stools.  Nausea no vomiting.  No bleeding.  Review of Systems  Constitutional: Negative for chills, diaphoresis, fever and malaise/fatigue.  HENT: Negative for nosebleeds and sore throat.   Eyes: Negative for double vision.  Respiratory: Negative for cough, hemoptysis, sputum production, shortness of breath and wheezing.   Cardiovascular: Negative for chest pain, palpitations, orthopnea and leg swelling.  Gastrointestinal: Negative for abdominal pain, blood in stool, constipation, diarrhea, heartburn, melena, nausea and vomiting.  Genitourinary: Negative for dysuria.  Musculoskeletal: Negative for back pain and joint pain.  Skin: Negative.  Negative for itching and rash.  Neurological: Negative for dizziness, tingling, focal weakness, weakness and headaches.  Endo/Heme/Allergies: Does not bruise/bleed easily.  Psychiatric/Behavioral: Negative for depression. The patient is not nervous/anxious and does not have insomnia.      PAST MEDICAL HISTORY :  Past Medical History:  Diagnosis Date  . Allergic rhinitis   . Arthropathy   . B12 deficiency anemia   . Chronic neck pain   . CVA  (cerebral infarction)   . GERD (gastroesophageal reflux disease)   . History of seizures   . HTN (hypertension)   . IDA (iron deficiency anemia)   . ITP (idiopathic thrombocytopenic purpura) 07/16/14    PAST SURGICAL HISTORY :   Past Surgical History:  Procedure Laterality Date  . CHOLECYSTECTOMY  10/13/1988  . COLONOSCOPY  10/2014, 08/2014   Dr. Gwenlyn Perking  . UPPER GI ENDOSCOPY      FAMILY HISTORY :   Family History  Problem Relation Age of Onset  . Heart disease Other   . Cirrhosis Mother     SOCIAL HISTORY:   Social History   Tobacco Use  . Smoking status: Never Smoker  . Smokeless tobacco: Never Used  Vaping Use  . Vaping Use: Never used  Substance Use Topics  . Alcohol use: No    Alcohol/week: 0.0 standard drinks  . Drug use: No    ALLERGIES:  is allergic to meperidine.  MEDICATIONS:  Current Outpatient Medications  Medication Sig Dispense Refill  . aspirin EC 81 MG tablet Take 81 mg by mouth 2 (two) times daily.     . Ibuprofen (ADVIL LIQUI-GELS MINIS) 200 MG CAPS Take 2 capsules (400 mg total) by mouth at bedtime as needed. 120 capsule 0  . levETIRAcetam (KEPPRA) 500 MG tablet Take 1 tablet (500 mg total) by mouth 2 (two) times daily. Seizure prevention. 60 tablet 2  . levothyroxine (SYNTHROID) 50 MCG tablet Take 50 mcg by mouth daily.    Marland Kitchen nystatin cream (MYCOSTATIN) Apply topically 2 (two) times daily.    . Probiotic Product ([place]) Take by mouth daily.    . RABEprazole (ACIPHEX) 20 MG tablet Take 1 tablet by mouth  daily. 1 hour before meals     No current facility-administered medications for this visit.    PHYSICAL EXAMINATION:   BP (!) 149/73 (BP Location: Left Arm, Patient Position: Sitting, Cuff Size: Normal)   Pulse 72   Temp (!) 95.9 F (35.5 C) (Tympanic)   Resp 16   Ht 5\' 11"  (1.803 m)   Wt 172 lb (78 kg)   SpO2 99%   BMI 23.99 kg/m   Filed Weights   09/21/20 1312  Weight: 172 lb (78 kg)    Physical  Exam Constitutional:      Comments: He is walking himself.  HENT:     Head: Normocephalic and atraumatic.     Mouth/Throat:     Pharynx: No oropharyngeal exudate.  Eyes:     Pupils: Pupils are equal, round, and reactive to light.  Cardiovascular:     Rate and Rhythm: Normal rate and regular rhythm.  Pulmonary:     Effort: No respiratory distress.     Breath sounds: No wheezing.  Abdominal:     General: Bowel sounds are normal. There is no distension.     Palpations: Abdomen is soft. There is no mass.     Tenderness: There is no abdominal tenderness. There is no guarding or rebound.  Musculoskeletal:        General: No tenderness. Normal range of motion.     Cervical back: Normal range of motion and neck supple.  Skin:    General: Skin is warm.  Neurological:     Mental Status: He is alert and oriented to person, place, and time.  Psychiatric:        Mood and Affect: Affect normal.     LABORATORY DATA:  I have reviewed the data as listed    Component Value Date/Time   NA 140 09/21/2020 1257   NA 137 02/24/2015 1131   K 3.9 09/21/2020 1257   K 4.1 02/24/2015 1131   CL 105 09/21/2020 1257   CL 103 02/24/2015 1131   CO2 26 09/21/2020 1257   CO2 29 02/24/2015 1131   GLUCOSE 90 09/21/2020 1257   GLUCOSE 135 (H) 02/24/2015 1131   BUN 20 09/21/2020 1257   BUN 14 02/24/2015 1131   CREATININE 1.30 (H) 09/21/2020 1257   CREATININE 1.32 (H) 02/24/2015 1131   CALCIUM 7.7 (L) 09/21/2020 1257   CALCIUM 8.8 (L) 02/24/2015 1131   PROT 6.4 (L) 06/18/2020 0409   PROT 6.5 02/24/2015 1131   ALBUMIN 3.7 06/18/2020 0409   ALBUMIN 3.9 02/24/2015 1131   AST 20 06/18/2020 0409   AST 19 02/24/2015 1131   ALT 11 06/18/2020 0409   ALT 11 (L) 02/24/2015 1131   ALKPHOS 59 06/18/2020 0409   ALKPHOS 43 02/24/2015 1131   BILITOT 0.8 06/18/2020 0409   BILITOT 0.9 02/24/2015 1131   GFRNONAA 54 (L) 09/21/2020 1257   GFRNONAA 50 (L) 02/24/2015 1131   GFRAA >60 06/20/2020 0506   GFRAA 58  (L) 02/24/2015 1131    No results found for: SPEP, UPEP  Lab Results  Component Value Date   WBC 6.8 09/21/2020   NEUTROABS 4.9 09/21/2020   HGB 10.7 (L) 09/21/2020   HCT 32.1 (L) 09/21/2020   MCV 87.5 09/21/2020   PLT 135 (L) 09/21/2020      Chemistry      Component Value Date/Time   NA 140 09/21/2020 1257   NA 137 02/24/2015 1131   K 3.9 09/21/2020 1257   K 4.1 02/24/2015 1131  CL 105 09/21/2020 1257   CL 103 02/24/2015 1131   CO2 26 09/21/2020 1257   CO2 29 02/24/2015 1131   BUN 20 09/21/2020 1257   BUN 14 02/24/2015 1131   CREATININE 1.30 (H) 09/21/2020 1257   CREATININE 1.32 (H) 02/24/2015 1131      Component Value Date/Time   CALCIUM 7.7 (L) 09/21/2020 1257   CALCIUM 8.8 (L) 02/24/2015 1131   ALKPHOS 59 06/18/2020 0409   ALKPHOS 43 02/24/2015 1131   AST 20 06/18/2020 0409   AST 19 02/24/2015 1131   ALT 11 06/18/2020 0409   ALT 11 (L) 02/24/2015 1131   BILITOT 0.8 06/18/2020 0409   BILITOT 0.9 02/24/2015 1131        ASSESSMENT & PLAN:   Idiopathic thrombocytopenic purpura ([place]) # ITP-Steroid responsive.  Currently on surveillance.overall STABLE; Platelets of 135; Continue surveillance.   # IDA-  Mild anemia/likely from CKD- Hb 10.7; non-compliant with PO iron. Recommend PO iron.  STABLE. Re-check Iron levels.  Recommend p.o. iron.  # Hypocalcemia ca- 7.7; recommend ca+ vit D BID.   # CKD-stage III [GFR-54]  STABLE.   #Seizures -stable; on keppra.   #  DISPOSITION: check Iron studies/ ferritin # follow up in 6 months- MD;  [cbcbmp/]- Dr.B   Dr.Bliss    Cammie Sickle, MD 09/24/2020 7:38 AM

## 2020-11-16 DIAGNOSIS — H40003 Preglaucoma, unspecified, bilateral: Secondary | ICD-10-CM | POA: Diagnosis not present

## 2020-11-22 DIAGNOSIS — H6980 Other specified disorders of Eustachian tube, unspecified ear: Secondary | ICD-10-CM | POA: Diagnosis not present

## 2020-11-22 DIAGNOSIS — J342 Deviated nasal septum: Secondary | ICD-10-CM | POA: Diagnosis not present

## 2020-11-22 DIAGNOSIS — H90A31 Mixed conductive and sensorineural hearing loss, unilateral, right ear with restricted hearing on the contralateral side: Secondary | ICD-10-CM | POA: Diagnosis not present

## 2020-11-22 DIAGNOSIS — H6123 Impacted cerumen, bilateral: Secondary | ICD-10-CM | POA: Diagnosis not present

## 2020-12-17 DIAGNOSIS — Z6828 Body mass index (BMI) 28.0-28.9, adult: Secondary | ICD-10-CM | POA: Diagnosis not present

## 2020-12-17 DIAGNOSIS — H9202 Otalgia, left ear: Secondary | ICD-10-CM | POA: Diagnosis not present

## 2021-01-03 DIAGNOSIS — H6520 Chronic serous otitis media, unspecified ear: Secondary | ICD-10-CM | POA: Diagnosis not present

## 2021-01-03 DIAGNOSIS — H908 Mixed conductive and sensorineural hearing loss, unspecified: Secondary | ICD-10-CM | POA: Diagnosis not present

## 2021-01-13 ENCOUNTER — Encounter: Payer: Self-pay | Admitting: Anesthesiology

## 2021-01-13 ENCOUNTER — Encounter: Payer: Self-pay | Admitting: Otolaryngology

## 2021-01-13 ENCOUNTER — Other Ambulatory Visit: Payer: Self-pay

## 2021-01-17 NOTE — Discharge Instructions (Signed)
[facility] °DISCHARGE INSTRUCTIONS FOR MYRINGOTOMY AND TUBE INSERTION ° °[facility] °PAUL JUENGEL, M.D. ° °Diet:   After surgery, the patient should take only liquids and foods as tolerated.  The patient may then have a regular diet after the effects of anesthesia have worn off, usually about four to six hours after surgery. ° °Activities:   The patient should rest until the effects of anesthesia have worn off.  After this, there are no restrictions on the normal daily activities. ° °Medications:   You will be given antibiotic drops to be used in the ears postoperatively.  It is recommended to use 3 drops 3 times a day for 3 days, then the drops should be saved for possible future use. ° °The tubes should not cause any discomfort to the patient, but if there is any question, Tylenol should be given according to the instructions for the age of the patient. ° °Other medications should be continued normally. ° °Precautions:   Should there be recurrent drainage after the tubes are placed, the drops should be used for approximately 3-4 days.  If it does not clear, you should call the ENT office. ° °Earplugs:   Earplugs are only needed for those who are going to be submerged under water.  When taking a bath or shower and using a cup or showerhead to rinse hair, it is not necessary to wear earplugs.  These come in a variety of fashions, all of which can be obtained at our office.  However, if one is not able to come by the office, then silicone plugs can be found at most pharmacies.  It is not advised to stick anything in the ear that is not approved as an earplug.  Silly putty is not to be used as an earplug.  Swimming is allowed in patients after ear tubes are inserted, however, they must wear earplugs if they are going to be submerged under water.  For those children who are going to be swimming a lot, it is recommended to use a fitted ear mold, which can be made by our audiologist.   If discharge is noticed from the ears, this most likely represents an ear infection.  We would recommend getting your eardrops and using them as indicated above.  If it does not clear, then you should call the ENT office.  For follow up, the patient should return to the ENT office three weeks postoperatively and then every six months as required by the doctor. ° ° °General Anesthesia, Adult, Care After °This sheet gives you information about how to care for yourself after your procedure. Your health care provider may also give you more specific instructions. If you have problems or questions, contact your health care provider. °What can I expect after the procedure? °After the procedure, the following side effects are common: °· Pain or discomfort at the IV site. °· Nausea. °· Vomiting. °· Sore throat. °· Trouble concentrating. °· Feeling cold or chills. °· Feeling weak or tired. °· Sleepiness and fatigue. °· Soreness and body aches. These side effects can affect parts of the body that were not involved in surgery. °Follow these instructions at home: °For the time period you were told by your health care provider: °· Rest. °· Do not participate in activities where you could fall or become injured. °· Do not drive or use machinery. °· Do not drink alcohol. °· Do not take sleeping pills or medicines that cause drowsiness. °· Do   not make important decisions or sign legal documents. °· Do not take care of children on your own.   °Eating and drinking °· Follow any instructions from your health care provider about eating or drinking restrictions. °· When you feel hungry, start by eating small amounts of foods that are soft and easy to digest (bland), such as toast. Gradually return to your regular diet. °· Drink enough fluid to keep your urine pale yellow. °· If you vomit, rehydrate by drinking water, juice, or clear broth. °General instructions °· If you have sleep apnea, surgery and certain medicines can increase your  risk for breathing problems. Follow instructions from your health care provider about wearing your sleep device: °? Anytime you are sleeping, including during daytime naps. °? While taking prescription pain medicines, sleeping medicines, or medicines that make you drowsy. °· Have a responsible adult stay with you for the time you are told. It is important to have someone help care for you until you are awake and alert. °· Return to your normal activities as told by your health care provider. Ask your health care provider what activities are safe for you. °· Take over-the-counter and prescription medicines only as told by your health care provider. °· If you smoke, do not smoke without supervision. °· Keep all follow-up visits as told by your health care provider. This is important. °Contact a health care provider if: °· You have nausea or vomiting that does not get better with medicine. °· You cannot eat or drink without vomiting. °· You have pain that does not get better with medicine. °· You are unable to pass urine. °· You develop a skin rash. °· You have a fever. °· You have redness around your IV site that gets worse. °Get help right away if: °· You have difficulty breathing. °· You have chest pain. °· You have blood in your urine or stool, or you vomit blood. °Summary °· After the procedure, it is common to have a sore throat or nausea. It is also common to feel tired. °· Have a responsible adult stay with you for the time you are told. It is important to have someone help care for you until you are awake and alert. °· When you feel hungry, start by eating small amounts of foods that are soft and easy to digest (bland), such as toast. Gradually return to your regular diet. °· Drink enough fluid to keep your urine pale yellow. °· Return to your normal activities as told by your health care provider. Ask your health care provider what activities are safe for you. °This information is not intended to replace  advice given to you by your health care provider. Make sure you discuss any questions you have with your health care provider. °Document Revised: 07/15/2020 Document Reviewed: 02/12/2020 °Elsevier Patient Education © 2021 [employer] Inc. ° °

## 2021-01-18 ENCOUNTER — Other Ambulatory Visit
Admission: RE | Admit: 2021-01-18 | Discharge: 2021-01-18 | Disposition: A | Discharge status: Home / Self Care | Payer: PPO | Source: Ambulatory Visit | Attending: Otolaryngology | Admitting: Otolaryngology

## 2021-01-18 ENCOUNTER — Other Ambulatory Visit: Payer: Self-pay

## 2021-01-18 DIAGNOSIS — Z20822 Contact with and (suspected) exposure to covid-19: Secondary | ICD-10-CM | POA: Diagnosis not present

## 2021-01-18 DIAGNOSIS — Z01812 Encounter for preprocedural laboratory examination: Secondary | ICD-10-CM | POA: Diagnosis not present

## 2021-01-19 LAB — SARS CORONAVIRUS 2 (TAT 6-24 HRS): SARS Coronavirus 2: NEGATIVE

## 2021-01-20 ENCOUNTER — Ambulatory Visit: Admission: RE | Admit: 2021-01-20 | Payer: PPO | Source: Home / Self Care | Admitting: Otolaryngology

## 2021-01-20 HISTORY — DX: Presence of dental prosthetic device (complete) (partial): Z97.2

## 2021-01-20 SURGERY — MYRINGOTOMY WITH TUBE PLACEMENT
Anesthesia: General | Laterality: Bilateral

## 2021-01-24 ENCOUNTER — Encounter: Payer: Self-pay | Admitting: Otolaryngology

## 2021-01-24 ENCOUNTER — Other Ambulatory Visit: Payer: Self-pay

## 2021-01-25 ENCOUNTER — Other Ambulatory Visit
Admission: RE | Admit: 2021-01-25 | Discharge: 2021-01-25 | Disposition: A | Discharge status: Home / Self Care | Payer: PPO | Source: Ambulatory Visit | Attending: Otolaryngology | Admitting: Otolaryngology

## 2021-01-25 DIAGNOSIS — Z01812 Encounter for preprocedural laboratory examination: Secondary | ICD-10-CM | POA: Diagnosis not present

## 2021-01-25 DIAGNOSIS — Z20822 Contact with and (suspected) exposure to covid-19: Secondary | ICD-10-CM | POA: Diagnosis not present

## 2021-01-25 LAB — SARS CORONAVIRUS 2 (TAT 6-24 HRS): SARS Coronavirus 2: NEGATIVE

## 2021-01-25 NOTE — Discharge Instructions (Signed)
[facility] °DISCHARGE INSTRUCTIONS FOR MYRINGOTOMY AND TUBE INSERTION ° °[facility] °PAUL JUENGEL, M.D. ° °Diet:   After surgery, the patient should take only liquids and foods as tolerated.  The patient may then have a regular diet after the effects of anesthesia have worn off, usually about four to six hours after surgery. ° °Activities:   The patient should rest until the effects of anesthesia have worn off.  After this, there are no restrictions on the normal daily activities. ° °Medications:   You will be given antibiotic drops to be used in the ears postoperatively.  It is recommended to use 3 drops 3 times a day for 3 days, then the drops should be saved for possible future use. ° °The tubes should not cause any discomfort to the patient, but if there is any question, Tylenol should be given according to the instructions for the age of the patient. ° °Other medications should be continued normally. ° °Precautions:   Should there be recurrent drainage after the tubes are placed, the drops should be used for approximately 3-4 days.  If it does not clear, you should call the ENT office. ° °Earplugs:   Earplugs are only needed for those who are going to be submerged under water.  When taking a bath or shower and using a cup or showerhead to rinse hair, it is not necessary to wear earplugs.  These come in a variety of fashions, all of which can be obtained at our office.  However, if one is not able to come by the office, then silicone plugs can be found at most pharmacies.  It is not advised to stick anything in the ear that is not approved as an earplug.  Silly putty is not to be used as an earplug.  Swimming is allowed in patients after ear tubes are inserted, however, they must wear earplugs if they are going to be submerged under water.  For those children who are going to be swimming a lot, it is recommended to use a fitted ear mold, which can be made by our audiologist.   If discharge is noticed from the ears, this most likely represents an ear infection.  We would recommend getting your eardrops and using them as indicated above.  If it does not clear, then you should call the ENT office.  For follow up, the patient should return to the ENT office three weeks postoperatively and then every six months as required by the doctor. ° ° °General Anesthesia, Adult, Care After °This sheet gives you information about how to care for yourself after your procedure. Your health care provider may also give you more specific instructions. If you have problems or questions, contact your health care provider. °What can I expect after the procedure? °After the procedure, the following side effects are common: °· Pain or discomfort at the IV site. °· Nausea. °· Vomiting. °· Sore throat. °· Trouble concentrating. °· Feeling cold or chills. °· Feeling weak or tired. °· Sleepiness and fatigue. °· Soreness and body aches. These side effects can affect parts of the body that were not involved in surgery. °Follow these instructions at home: °For the time period you were told by your health care provider: °· Rest. °· Do not participate in activities where you could fall or become injured. °· Do not drive or use machinery. °· Do not drink alcohol. °· Do not take sleeping pills or medicines that cause drowsiness. °· Do   not make important decisions or sign legal documents. °· Do not take care of children on your own.   °Eating and drinking °· Follow any instructions from your health care provider about eating or drinking restrictions. °· When you feel hungry, start by eating small amounts of foods that are soft and easy to digest (bland), such as toast. Gradually return to your regular diet. °· Drink enough fluid to keep your urine pale yellow. °· If you vomit, rehydrate by drinking water, juice, or clear broth. °General instructions °· If you have sleep apnea, surgery and certain medicines can increase your  risk for breathing problems. Follow instructions from your health care provider about wearing your sleep device: °? Anytime you are sleeping, including during daytime naps. °? While taking prescription pain medicines, sleeping medicines, or medicines that make you drowsy. °· Have a responsible adult stay with you for the time you are told. It is important to have someone help care for you until you are awake and alert. °· Return to your normal activities as told by your health care provider. Ask your health care provider what activities are safe for you. °· Take over-the-counter and prescription medicines only as told by your health care provider. °· If you smoke, do not smoke without supervision. °· Keep all follow-up visits as told by your health care provider. This is important. °Contact a health care provider if: °· You have nausea or vomiting that does not get better with medicine. °· You cannot eat or drink without vomiting. °· You have pain that does not get better with medicine. °· You are unable to pass urine. °· You develop a skin rash. °· You have a fever. °· You have redness around your IV site that gets worse. °Get help right away if: °· You have difficulty breathing. °· You have chest pain. °· You have blood in your urine or stool, or you vomit blood. °Summary °· After the procedure, it is common to have a sore throat or nausea. It is also common to feel tired. °· Have a responsible adult stay with you for the time you are told. It is important to have someone help care for you until you are awake and alert. °· When you feel hungry, start by eating small amounts of foods that are soft and easy to digest (bland), such as toast. Gradually return to your regular diet. °· Drink enough fluid to keep your urine pale yellow. °· Return to your normal activities as told by your health care provider. Ask your health care provider what activities are safe for you. °This information is not intended to replace  advice given to you by your health care provider. Make sure you discuss any questions you have with your health care provider. °Document Revised: 07/15/2020 Document Reviewed: 02/12/2020 °Elsevier Patient Education © 2021 Elsevier Inc. ° °

## 2021-01-27 ENCOUNTER — Ambulatory Visit: Payer: PPO | Admitting: Anesthesiology

## 2021-01-27 ENCOUNTER — Ambulatory Visit
Admission: RE | Admit: 2021-01-27 | Discharge: 2021-01-27 | Disposition: A | Discharge status: Home / Self Care | Payer: PPO | Attending: Otolaryngology | Admitting: Otolaryngology

## 2021-01-27 ENCOUNTER — Encounter
Admission: RE | Disposition: A | Discharge status: Home / Self Care | Payer: Self-pay | Source: Home / Self Care | Attending: Otolaryngology

## 2021-01-27 ENCOUNTER — Encounter: Payer: Self-pay | Admitting: Otolaryngology

## 2021-01-27 ENCOUNTER — Other Ambulatory Visit: Payer: Self-pay

## 2021-01-27 DIAGNOSIS — Z885 Allergy status to narcotic agent status: Secondary | ICD-10-CM | POA: Insufficient documentation

## 2021-01-27 DIAGNOSIS — H6993 Unspecified Eustachian tube disorder, bilateral: Secondary | ICD-10-CM | POA: Diagnosis not present

## 2021-01-27 DIAGNOSIS — H6523 Chronic serous otitis media, bilateral: Secondary | ICD-10-CM | POA: Insufficient documentation

## 2021-01-27 DIAGNOSIS — Z79899 Other long term (current) drug therapy: Secondary | ICD-10-CM | POA: Diagnosis not present

## 2021-01-27 DIAGNOSIS — H6121 Impacted cerumen, right ear: Secondary | ICD-10-CM | POA: Insufficient documentation

## 2021-01-27 DIAGNOSIS — Z8249 Family history of ischemic heart disease and other diseases of the circulatory system: Secondary | ICD-10-CM | POA: Insufficient documentation

## 2021-01-27 DIAGNOSIS — H6983 Other specified disorders of Eustachian tube, bilateral: Secondary | ICD-10-CM | POA: Diagnosis not present

## 2021-01-27 DIAGNOSIS — H6123 Impacted cerumen, bilateral: Secondary | ICD-10-CM | POA: Diagnosis not present

## 2021-01-27 HISTORY — PX: MYRINGOTOMY WITH TUBE PLACEMENT: SHX5663

## 2021-01-27 SURGERY — MYRINGOTOMY WITH TUBE PLACEMENT
Anesthesia: General | Site: Ear | Laterality: Bilateral

## 2021-01-27 MED ORDER — GLYCOPYRROLATE 0.2 MG/ML IJ SOLN
INTRAMUSCULAR | Status: DC | PRN
  Administered 2021-01-27: .1 mg via INTRAVENOUS

## 2021-01-27 MED ORDER — PROPOFOL 10 MG/ML IV BOLUS
INTRAVENOUS | Status: DC | PRN
  Administered 2021-01-27: 70 mg via INTRAVENOUS

## 2021-01-27 MED ORDER — CIPROFLOXACIN-DEXAMETHASONE 0.3-0.1 % OT SUSP
OTIC | Status: DC | PRN
  Administered 2021-01-27: 4 [drp] via OTIC

## 2021-01-27 MED ORDER — LACTATED RINGERS IV SOLN: INTRAVENOUS | Status: DC

## 2021-01-27 MED ORDER — CIPROFLOXACIN-DEXAMETHASONE 0.3-0.1 % OT SUSP: 4.0000 [drp] | Freq: Three times a day (TID) | OTIC | 0 refills | Status: DC

## 2021-01-27 MED ORDER — HYDROGEN PEROXIDE 3 % EX SOLN
CUTANEOUS | Status: DC | PRN
  Administered 2021-01-27: 1

## 2021-01-27 MED ORDER — ONDANSETRON HCL 4 MG/2ML IJ SOLN
INTRAMUSCULAR | Status: DC | PRN
  Administered 2021-01-27: 4 mg via INTRAVENOUS

## 2021-01-27 MED ORDER — LIDOCAINE HCL (CARDIAC) PF 100 MG/5ML IV SOSY
PREFILLED_SYRINGE | INTRAVENOUS | Status: DC | PRN
  Administered 2021-01-27: 50 mg via INTRAVENOUS

## 2021-01-27 SURGICAL SUPPLY — 16 items
BALL CTTN LRG ABS STRL LF (GAUZE/BANDAGES/DRESSINGS) ×1
BASIN GRAD PLASTIC 32OZ STRL (MISCELLANEOUS) ×1 IMPLANT
BLADE MYR LANCE NRW W/HDL (BLADE) ×2 IMPLANT
CANISTER SUCT 1200ML W/VALVE (MISCELLANEOUS) ×2 IMPLANT
CATH IV 18X1 1/4 SAFELET (CATHETERS) ×1 IMPLANT
COTTONBALL LRG STERILE PKG (GAUZE/BANDAGES/DRESSINGS) ×2 IMPLANT
GLOVE PI ULTRA LF STRL 7.5 (GLOVE) ×1 IMPLANT
GLOVE PI ULTRA NON LATEX 7.5 (GLOVE) ×1
IV CATH 18X1 1/4 SAFELET (CATHETERS) ×1
STRAP BODY AND KNEE 60X3 (MISCELLANEOUS) ×2 IMPLANT
SYR 3ML LL SCALE MARK (SYRINGE) ×1 IMPLANT
TOWEL OR 17X26 4PK STRL BLUE (TOWEL DISPOSABLE) ×2 IMPLANT
TUBE EAR ARMSTRONG FL 1.14X4.5 (OTOLOGIC RELATED) ×4 IMPLANT
TUBING CONN 6MMX3.1M (TUBING) ×1
TUBING SUCTION CONN 0.25 STRL (TUBING) ×1 IMPLANT
WATER STERILE IRR 250ML POUR (IV SOLUTION) ×1 IMPLANT

## 2021-01-27 NOTE — Anesthesia Preprocedure Evaluation (Signed)
Anesthesia Evaluation  Patient identified by MRN, date of birth, ID band Patient awake    Reviewed: Allergy & Precautions, NPO status , Patient's Chart, lab work & pertinent test results  Airway Mallampati: II  TM Distance: >3 FB Neck ROM: Full    Dental  (+) Partial Lower, Partial Upper   Pulmonary    Pulmonary exam normal        Cardiovascular hypertension, Normal cardiovascular exam     Neuro/Psych Seizures - (Last seizure more than 6 months ago. Stable on current Kepra dose), Well Controlled,  CVA negative psych ROS   GI/Hepatic GERD  Medicated and Controlled,  Endo/Other    Renal/GU CRFRenal disease     Musculoskeletal  (+) Arthritis ,   Abdominal Normal abdominal exam  (+)   Peds  Hematology  (+) anemia ,   Anesthesia Other Findings   Reproductive/Obstetrics                             Anesthesia Physical Anesthesia Plan  ASA: III  Anesthesia Plan: General   Post-op Pain Management:    Induction: Intravenous  PONV Risk Score and Plan: 2 and Ondansetron, Propofol infusion, TIVA and Treatment may vary due to age or medical condition  Airway Management Planned: Natural Airway and Nasal Cannula  Additional Equipment: None  Intra-op Plan:   Post-operative Plan:   Informed Consent: I have reviewed the patients History and Physical, chart, labs and discussed the procedure including the risks, benefits and alternatives for the proposed anesthesia with the patient or authorized representative who has indicated his/her understanding and acceptance.     Dental advisory given  Plan Discussed with: CRNA  Anesthesia Plan Comments:         Anesthesia Quick Evaluation

## 2021-01-27 NOTE — H&P (Signed)
H&P has been reviewed and patient reevaluated, no changes necessary. To be downloaded later.  

## 2021-01-27 NOTE — Anesthesia Procedure Notes (Signed)
Procedure Name: General with mask airway Performed by: Antha Niday, CRNA Pre-anesthesia Checklist: Patient identified, Patient being monitored, Emergency Drugs available, Timeout performed and Suction available Patient Re-evaluated:Patient Re-evaluated prior to induction Oxygen Delivery Method: Circle system utilized Preoxygenation: Pre-oxygenation with 100% oxygen Induction Type: Combination inhalational/ intravenous induction Ventilation: Mask ventilation without difficulty Dental Injury: Teeth and Oropharynx as per pre-operative assessment        

## 2021-01-27 NOTE — Transfer of Care (Signed)
Immediate Anesthesia Transfer of Care Note  Patient: [name]  Procedure(s) Performed: MYRINGOTOMY WITH TUBE PLACEMENT (Bilateral Ear)  Patient Location: PACU  Anesthesia Type: General  Level of Consciousness: awake, alert  and patient cooperative  Airway and Oxygen Therapy: Patient Spontanous Breathing and Patient connected to supplemental oxygen  Post-op Assessment: Post-op Vital signs reviewed, Patient's Cardiovascular Status Stable, Respiratory Function Stable, Patent Airway and No signs of Nausea or vomiting  Post-op Vital Signs: Reviewed and stable  Complications: No complications documented.

## 2021-01-27 NOTE — Anesthesia Postprocedure Evaluation (Signed)
Anesthesia Post Note  Patient: [name]  Procedure(s) Performed: MYRINGOTOMY WITH TUBE PLACEMENT (Bilateral Ear)     Patient location during evaluation: PACU Anesthesia Type: General Level of consciousness: awake and alert Pain management: pain level controlled Vital Signs Assessment: post-procedure vital signs reviewed and stable Respiratory status: nonlabored ventilation and spontaneous breathing Cardiovascular status: blood pressure returned to baseline Postop Assessment: no apparent nausea or vomiting Anesthetic complications: no   No complications documented.  Ahmiyah Coil [employer]

## 2021-01-27 NOTE — Op Note (Signed)
01/27/2021  8:27 AM    [name]  [id number]   Pre-Op Dx: Chronic serous otitis media with eustachian tube dysfunction  Post-op Dx: Chronic serous otitis media with eustachian tube dysfunction, right cerumen impaction  Proc:Bilateral myringotomy with tubes with removal of right cerumen impaction  Surg: Huey Romans  Anes:  General by mask  EBL:  None  Comp: None  Findings: The right ear canal is completely filled up with wax and took a while to get this all cleaned out.  He has very small ear canals.  I had to use some peroxide to soften it and suction to get some of it out along with very small curettes to remove this.  Once the wax was removed the eardrum was retracted and fluid filling the middle ear space.  The left ear canal did not have wax but there was fluid in the left middle ear.  Short Armstrong 5 tubes were placed on both sides.  Procedure: With the patient in a comfortable supine position, general mask anesthesia was administered.  At an appropriate level, microscope and speculum were used to examine and clean the RIGHT ear canal.  The findings were as described above.  An anterior inferior radial myringotomy incision was sharply executed.  Middle ear contents were suctioned clear.  A PE tube was placed without difficulty.  Ciprodex otic solution was instilled into the external canal, and insufflated into the middle ear.  A cotton ball was placed at the external meatus. Hemostasis was observed.  This side was completed.  After completing the RIGHT side, the LEFT side was done in identical fashion.    Following this  The patient was returned to anesthesia, awakened, and transferred to recovery in stable condition.  Dispo:  PACU to home  Plan: Routine drop use and water precautions.  Recheck my office three weeks with an audiogram.   Huey Romans 8:27 AM 01/27/2021

## 2021-01-28 ENCOUNTER — Encounter: Payer: Self-pay | Admitting: Otolaryngology

## 2021-02-07 DIAGNOSIS — R400 Somnolence: Secondary | ICD-10-CM | POA: Diagnosis not present

## 2021-02-07 DIAGNOSIS — G40909 Epilepsy, unspecified, not intractable, without status epilepticus: Secondary | ICD-10-CM | POA: Diagnosis not present

## 2021-02-07 DIAGNOSIS — Z8673 Personal history of transient ischemic attack (TIA), and cerebral infarction without residual deficits: Secondary | ICD-10-CM | POA: Diagnosis not present

## 2021-02-11 DIAGNOSIS — H6523 Chronic serous otitis media, bilateral: Secondary | ICD-10-CM | POA: Diagnosis not present

## 2021-02-18 DIAGNOSIS — H6983 Other specified disorders of Eustachian tube, bilateral: Secondary | ICD-10-CM | POA: Diagnosis not present

## 2021-02-18 DIAGNOSIS — H6523 Chronic serous otitis media, bilateral: Secondary | ICD-10-CM | POA: Diagnosis not present

## 2021-02-18 DIAGNOSIS — H903 Sensorineural hearing loss, bilateral: Secondary | ICD-10-CM | POA: Diagnosis not present

## 2021-02-18 DIAGNOSIS — H90A31 Mixed conductive and sensorineural hearing loss, unilateral, right ear with restricted hearing on the contralateral side: Secondary | ICD-10-CM | POA: Diagnosis not present

## 2021-03-22 ENCOUNTER — Inpatient Hospital Stay: Payer: PPO | Admitting: Internal Medicine

## 2021-03-22 ENCOUNTER — Inpatient Hospital Stay: Payer: PPO | Attending: Internal Medicine

## 2021-03-22 DIAGNOSIS — D693 Immune thrombocytopenic purpura: Secondary | ICD-10-CM

## 2021-03-22 DIAGNOSIS — Z79899 Other long term (current) drug therapy: Secondary | ICD-10-CM | POA: Insufficient documentation

## 2021-03-22 DIAGNOSIS — Z9049 Acquired absence of other specified parts of digestive tract: Secondary | ICD-10-CM | POA: Insufficient documentation

## 2021-03-22 DIAGNOSIS — Z8379 Family history of other diseases of the digestive system: Secondary | ICD-10-CM | POA: Insufficient documentation

## 2021-03-22 DIAGNOSIS — D631 Anemia in chronic kidney disease: Secondary | ICD-10-CM | POA: Insufficient documentation

## 2021-03-22 DIAGNOSIS — R42 Dizziness and giddiness: Secondary | ICD-10-CM | POA: Diagnosis not present

## 2021-03-22 DIAGNOSIS — Z8249 Family history of ischemic heart disease and other diseases of the circulatory system: Secondary | ICD-10-CM | POA: Diagnosis not present

## 2021-03-22 DIAGNOSIS — N1831 Chronic kidney disease, stage 3a: Secondary | ICD-10-CM | POA: Diagnosis not present

## 2021-03-22 DIAGNOSIS — Z885 Allergy status to narcotic agent status: Secondary | ICD-10-CM | POA: Insufficient documentation

## 2021-03-22 DIAGNOSIS — R569 Unspecified convulsions: Secondary | ICD-10-CM | POA: Insufficient documentation

## 2021-03-22 LAB — CBC WITH DIFFERENTIAL/PLATELET
Abs Immature Granulocytes: 0.03 10*3/uL (ref 0.00–0.07)
Basophils Absolute: 0 10*3/uL (ref 0.0–0.1)
Basophils Relative: 0 %
Eosinophils Absolute: 0.2 10*3/uL (ref 0.0–0.5)
Eosinophils Relative: 3 %
HCT: 33.6 % — ABNORMAL LOW (ref 39.0–52.0)
Hemoglobin: 11.1 g/dL — ABNORMAL LOW (ref 13.0–17.0)
Immature Granulocytes: 0 %
Lymphocytes Relative: 14 %
Lymphs Abs: 1 10*3/uL (ref 0.7–4.0)
MCH: 29.2 pg (ref 26.0–34.0)
MCHC: 33 g/dL (ref 30.0–36.0)
MCV: 88.4 fL (ref 80.0–100.0)
Monocytes Absolute: 0.8 10*3/uL (ref 0.1–1.0)
Monocytes Relative: 11 %
Neutro Abs: 5.1 10*3/uL (ref 1.7–7.7)
Neutrophils Relative %: 72 %
Platelets: 142 10*3/uL — ABNORMAL LOW (ref 150–400)
RBC: 3.8 MIL/uL — ABNORMAL LOW (ref 4.22–5.81)
RDW: 14.6 % (ref 11.5–15.5)
WBC: 7.1 10*3/uL (ref 4.0–10.5)
nRBC: 0 % (ref 0.0–0.2)

## 2021-03-22 LAB — BASIC METABOLIC PANEL
Anion gap: 11 (ref 5–15)
BUN: 14 mg/dL (ref 8–23)
CO2: 25 mmol/L (ref 22–32)
Calcium: 7.7 mg/dL — ABNORMAL LOW (ref 8.9–10.3)
Chloride: 102 mmol/L (ref 98–111)
Creatinine, Ser: 1.27 mg/dL — ABNORMAL HIGH (ref 0.61–1.24)
GFR, Estimated: 55 mL/min — ABNORMAL LOW (ref >60)
Glucose, Bld: 113 mg/dL — ABNORMAL HIGH (ref 70–99)
Potassium: 3.7 mmol/L (ref 3.5–5.1)
Sodium: 138 mmol/L (ref 135–145)

## 2021-03-22 NOTE — Progress Notes (Signed)
[facility] OFFICE PROGRESS NOTE  Patient Care Team: Lynnell Jude, MD as PCP - General (Family Medicine) Kate Sable, MD as PCP - Cardiology (Cardiology) Cammie Sickle, MD as Consulting Physician (Hematology and Oncology)   SUMMARY OF ONCOLOGIC HISTORY:  # SEP 2015- ITP [platelets- 16]; s/p prednisone ; Good Response; currently surveillance  # SEP 2015- Mild Anemia/IDA-CKD-III- ~12 s/p EGD/colo [Oct 2015; Dr.Skulskie] ; Ferritin- 17; NO BMBx;   # CKD creatinine ~1.4 [no nephro] ; 2021-new onset of Seziues   INTERVAL HISTORY:  87 year old male patient with history of ITP-is here for follow-up/currently on surveillance.   Patient denies gum bleeding or nosebleeds.  Denies any blood in stools or black or stools.  Denies any further episodes of seizures.  Patient interim had ear surgery/had tubes placed in his ear because of effusion.  Patient complains of mild dizziness this morning.  No falls.  Review of Systems  Constitutional: Negative for chills, diaphoresis, fever and malaise/fatigue.  HENT: Negative for nosebleeds and sore throat.   Eyes: Negative for double vision.  Respiratory: Negative for cough, hemoptysis, sputum production, shortness of breath and wheezing.   Cardiovascular: Negative for chest pain, palpitations, orthopnea and leg swelling.  Gastrointestinal: Negative for abdominal pain, blood in stool, constipation, diarrhea, heartburn, melena, nausea and vomiting.  Genitourinary: Negative for dysuria.  Musculoskeletal: Negative for back pain and joint pain.  Skin: Negative.  Negative for itching and rash.  Neurological: Negative for dizziness, tingling, focal weakness, weakness and headaches.  Endo/Heme/Allergies: Does not bruise/bleed easily.  Psychiatric/Behavioral: Negative for depression. The patient is not nervous/anxious and does not have insomnia.      PAST MEDICAL HISTORY :  Past Medical History:  Diagnosis Date  .  Allergic rhinitis   . Arthropathy   . B12 deficiency anemia   . Chronic neck pain   . CVA (cerebral infarction)   . GERD (gastroesophageal reflux disease)   . History of seizures   . HTN (hypertension)   . IDA (iron deficiency anemia)   . ITP (idiopathic thrombocytopenic purpura) 07/16/14  . Wears dentures    partial upper and lower    PAST SURGICAL HISTORY :   Past Surgical History:  Procedure Laterality Date  . CHOLECYSTECTOMY  10/13/1988  . COLONOSCOPY  10/2014, 08/2014   Dr. Gwenlyn Perking  . MYRINGOTOMY WITH TUBE PLACEMENT Bilateral 01/27/2021   Procedure: MYRINGOTOMY WITH TUBE PLACEMENT;  Surgeon: Margaretha Sheffield, MD;  Location: [facility];  Service: ENT;  Laterality: Bilateral;  . UPPER GI ENDOSCOPY      FAMILY HISTORY :   Family History  Problem Relation Age of Onset  . Heart disease Other   . Cirrhosis Mother     SOCIAL HISTORY:   Social History   Tobacco Use  . Smoking status: Never Smoker  . Smokeless tobacco: Never Used  Vaping Use  . Vaping Use: Never used  Substance Use Topics  . Alcohol use: No    Alcohol/week: 0.0 standard drinks  . Drug use: No    ALLERGIES:  is allergic to meperidine.  MEDICATIONS:  Current Outpatient Medications  Medication Sig Dispense Refill  . aspirin EC 81 MG tablet Take 81 mg by mouth 2 (two) times daily.     . Cyanocobalamin (VITAMIN B12 PO) Take by mouth daily.    . Ibuprofen (ADVIL LIQUI-GELS MINIS) 200 MG CAPS Take 2 capsules (400 mg total) by mouth at bedtime as needed. 120 capsule 0  . levothyroxine (SYNTHROID) 50 MCG tablet  Take 50 mcg by mouth daily.    Marland Kitchen nystatin cream (MYCOSTATIN) Apply topically 2 (two) times daily.    . Probiotic Product ([place]) Take by mouth daily.    . RABEprazole (ACIPHEX) 20 MG tablet Take 1 tablet by mouth daily. 1 hour before meals    . VITAMIN D PO Take by mouth daily.    Marland Kitchen levETIRAcetam (KEPPRA) 500 MG tablet Take 1 tablet (500 mg total) by mouth 2 (two) times  daily. Seizure prevention. 60 tablet 2   No current facility-administered medications for this visit.    PHYSICAL EXAMINATION:   BP (!) 142/87   Pulse 63   Temp (!) 95 F (35 C) (Tympanic)   Resp 20   Ht 5\' 11"  (1.803 m)   Wt 177 lb (80.3 kg)   BMI 24.69 kg/m   Filed Weights   03/22/21 1359  Weight: 177 lb (80.3 kg)    Physical Exam Constitutional:      Comments: He is walking himself.  HENT:     Head: Normocephalic and atraumatic.     Mouth/Throat:     Pharynx: No oropharyngeal exudate.  Eyes:     Pupils: Pupils are equal, round, and reactive to light.  Cardiovascular:     Rate and Rhythm: Normal rate and regular rhythm.  Pulmonary:     Effort: No respiratory distress.     Breath sounds: No wheezing.  Abdominal:     General: Bowel sounds are normal. There is no distension.     Palpations: Abdomen is soft. There is no mass.     Tenderness: There is no abdominal tenderness. There is no guarding or rebound.  Musculoskeletal:        General: No tenderness. Normal range of motion.     Cervical back: Normal range of motion and neck supple.  Skin:    General: Skin is warm.  Neurological:     Mental Status: He is alert and oriented to person, place, and time.  Psychiatric:        Mood and Affect: Affect normal.     LABORATORY DATA:  I have reviewed the data as listed    Component Value Date/Time   NA 138 03/22/2021 1312   NA 137 02/24/2015 1131   K 3.7 03/22/2021 1312   K 4.1 02/24/2015 1131   CL 102 03/22/2021 1312   CL 103 02/24/2015 1131   CO2 25 03/22/2021 1312   CO2 29 02/24/2015 1131   GLUCOSE 113 (H) 03/22/2021 1312   GLUCOSE 135 (H) 02/24/2015 1131   BUN 14 03/22/2021 1312   BUN 14 02/24/2015 1131   CREATININE 1.27 (H) 03/22/2021 1312   CREATININE 1.32 (H) 02/24/2015 1131   CALCIUM 7.7 (L) 03/22/2021 1312   CALCIUM 8.8 (L) 02/24/2015 1131   PROT 6.4 (L) 06/18/2020 0409   PROT 6.5 02/24/2015 1131   ALBUMIN 3.7 06/18/2020 0409   ALBUMIN 3.9  02/24/2015 1131   AST 20 06/18/2020 0409   AST 19 02/24/2015 1131   ALT 11 06/18/2020 0409   ALT 11 (L) 02/24/2015 1131   ALKPHOS 59 06/18/2020 0409   ALKPHOS 43 02/24/2015 1131   BILITOT 0.8 06/18/2020 0409   BILITOT 0.9 02/24/2015 1131   GFRNONAA 55 (L) 03/22/2021 1312   GFRNONAA 50 (L) 02/24/2015 1131   GFRAA >60 06/20/2020 0506   GFRAA 58 (L) 02/24/2015 1131    No results found for: SPEP, UPEP  Lab Results  Component Value Date  WBC 7.1 03/22/2021   NEUTROABS 5.1 03/22/2021   HGB 11.1 (L) 03/22/2021   HCT 33.6 (L) 03/22/2021   MCV 88.4 03/22/2021   PLT 142 (L) 03/22/2021      Chemistry      Component Value Date/Time   NA 138 03/22/2021 1312   NA 137 02/24/2015 1131   K 3.7 03/22/2021 1312   K 4.1 02/24/2015 1131   CL 102 03/22/2021 1312   CL 103 02/24/2015 1131   CO2 25 03/22/2021 1312   CO2 29 02/24/2015 1131   BUN 14 03/22/2021 1312   BUN 14 02/24/2015 1131   CREATININE 1.27 (H) 03/22/2021 1312   CREATININE 1.32 (H) 02/24/2015 1131      Component Value Date/Time   CALCIUM 7.7 (L) 03/22/2021 1312   CALCIUM 8.8 (L) 02/24/2015 1131   ALKPHOS 59 06/18/2020 0409   ALKPHOS 43 02/24/2015 1131   AST 20 06/18/2020 0409   AST 19 02/24/2015 1131   ALT 11 06/18/2020 0409   ALT 11 (L) 02/24/2015 1131   BILITOT 0.8 06/18/2020 0409   BILITOT 0.9 02/24/2015 1131        ASSESSMENT & PLAN:   Idiopathic thrombocytopenic purpura ([place]) # ITP-Steroid responsive.  Currently on surveillance.overall STABLE; Platelets of 142 Continue surveillance.   # IDA-  Mild anemia/likely from CKD- Hb 10.7; non-compliant with PO iron. Recommend PO iron.  STABLE. Re-check Iron levels.  Recommend p.o. iron.  # CKD-stage III [GFR-54]  STABLE; recommend hydration.   #Seizures -STABLE; on keppra.   # Dizziness [recent ear surgery; s/p tube]-recommend continued hydration.  If not improved recommend evaluation with ENT.  #DISPOSITION:  # follow up in 6 months- MD;  [cbcbmp/ Iron  studies/ ferritin]- Dr.B   Dr.Bliss    Cammie Sickle, MD 03/22/2021 8:05 PM

## 2021-03-22 NOTE — Assessment & Plan Note (Addendum)
#   ITP-Steroid responsive.  Currently on surveillance.overall STABLE; Platelets of 142 Continue surveillance.   # IDA-  Mild anemia/likely from CKD- Hb 10.7; non-compliant with PO iron. Recommend PO iron.  STABLE. Re-check Iron levels.  Recommend p.o. iron.  # CKD-stage III [GFR-54]  STABLE; recommend hydration.   #Seizures -STABLE; on keppra.   # Dizziness [recent ear surgery; s/p tube]-recommend continued hydration.  If not improved recommend evaluation with ENT.  #DISPOSITION:  # follow up in 6 months- MD;  [cbcbmp/ Iron studies/ ferritin]- Dr.B   Dr.Bliss

## 2021-03-23 ENCOUNTER — Other Ambulatory Visit: Payer: Self-pay

## 2021-03-23 DIAGNOSIS — D693 Immune thrombocytopenic purpura: Secondary | ICD-10-CM

## 2021-03-30 DIAGNOSIS — M1712 Unilateral primary osteoarthritis, left knee: Secondary | ICD-10-CM | POA: Diagnosis not present

## 2021-04-14 DIAGNOSIS — D509 Iron deficiency anemia, unspecified: Secondary | ICD-10-CM | POA: Diagnosis not present

## 2021-04-14 DIAGNOSIS — R351 Nocturia: Secondary | ICD-10-CM | POA: Diagnosis not present

## 2021-04-14 DIAGNOSIS — M170 Bilateral primary osteoarthritis of knee: Secondary | ICD-10-CM | POA: Diagnosis not present

## 2021-04-14 DIAGNOSIS — M25561 Pain in right knee: Secondary | ICD-10-CM | POA: Diagnosis not present

## 2021-04-14 DIAGNOSIS — Z6827 Body mass index (BMI) 27.0-27.9, adult: Secondary | ICD-10-CM | POA: Diagnosis not present

## 2021-04-14 DIAGNOSIS — L84 Corns and callosities: Secondary | ICD-10-CM | POA: Diagnosis not present

## 2021-04-14 DIAGNOSIS — N400 Enlarged prostate without lower urinary tract symptoms: Secondary | ICD-10-CM | POA: Diagnosis not present

## 2021-04-14 DIAGNOSIS — D519 Vitamin B12 deficiency anemia, unspecified: Secondary | ICD-10-CM | POA: Diagnosis not present

## 2021-05-06 IMAGING — CT CT HEAD W/O CM
3 series · 16 of 47 positions shown, 19 images · non-contrast
Comparison: 11/30/2019

CLINICAL DATA: Lethargic and altered mental status. Nausea and
vomiting.

EXAM:
CT HEAD WITHOUT CONTRAST
TECHNIQUE: Contiguous axial images were obtained from the base of the skull
through the vertex without intravenous contrast.

[Series 3: head wo · axial · 0.44mm/px · z∈[-64,+61]mm · 10 of 30 slices shown, 13 images]
[im 3/30  brain]
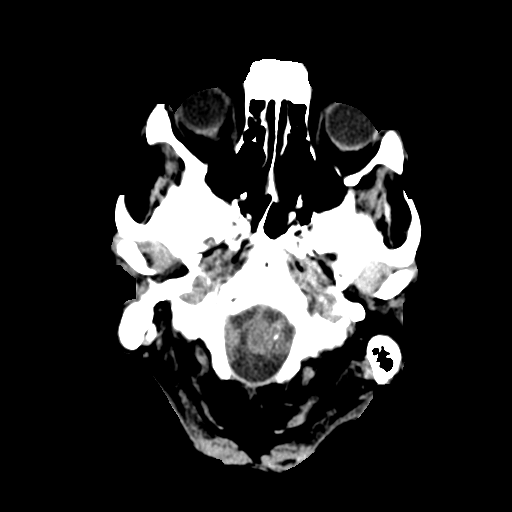
[im 3/30  bone]
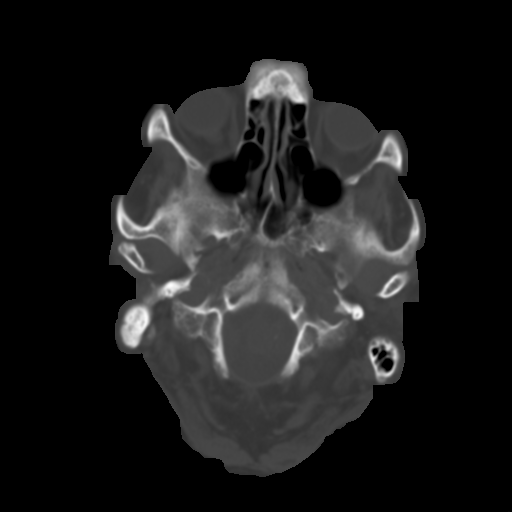
[im 6/30  brain]
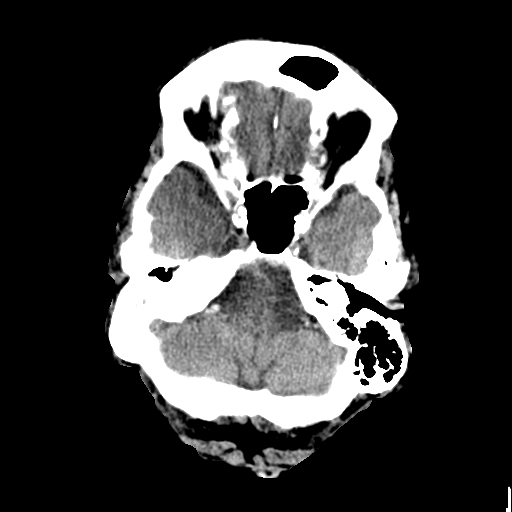
[im 9/30  brain]
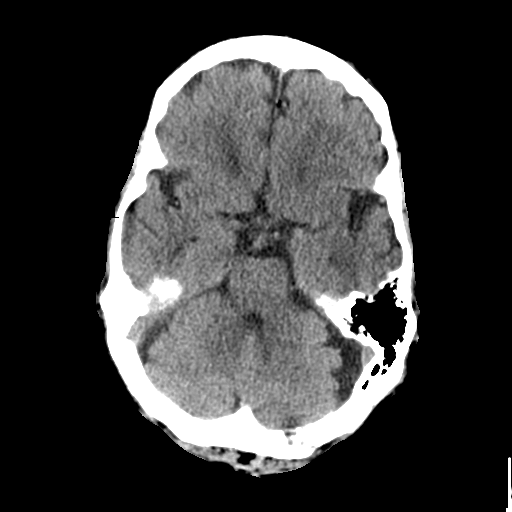
[im 11/30  brain]
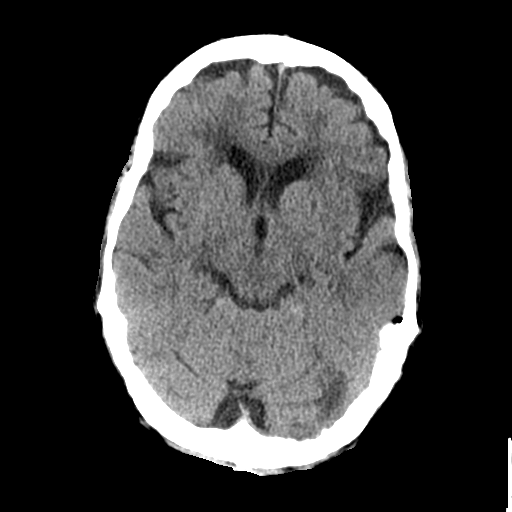
[im 14/30  brain]
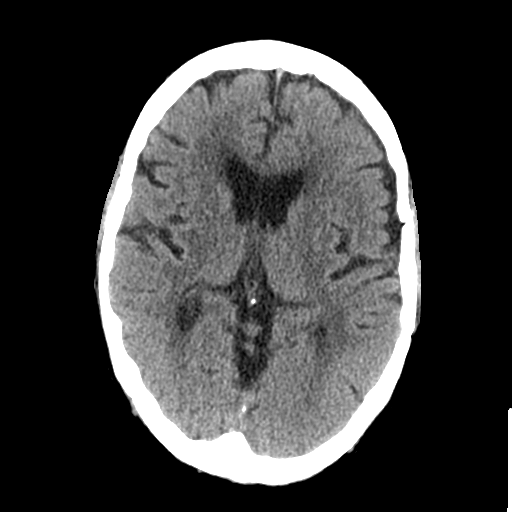
[im 14/30  bone]
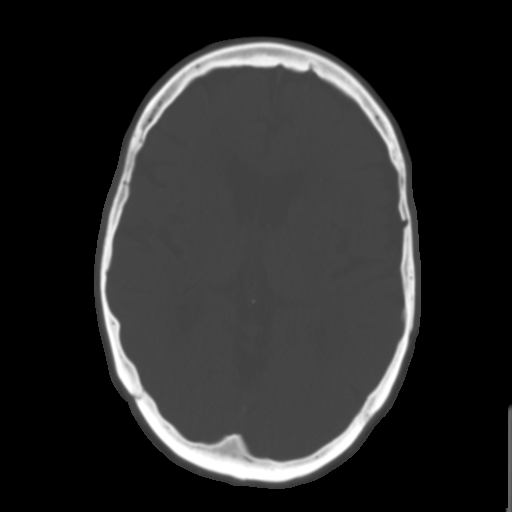
[im 17/30  brain]
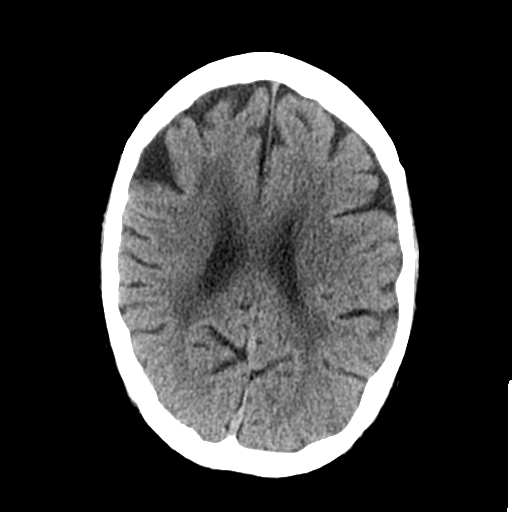
[im 20/30  brain]
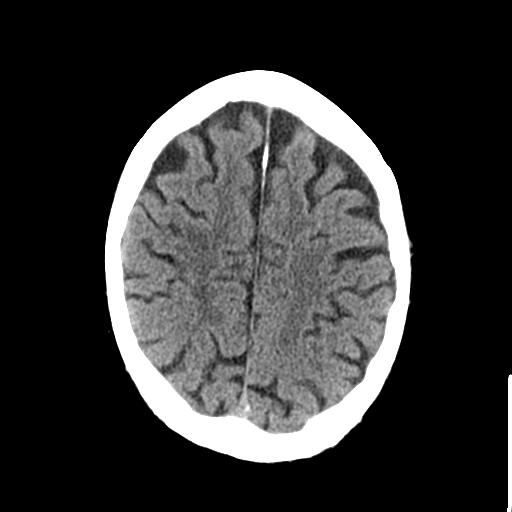
[im 23/30  brain]
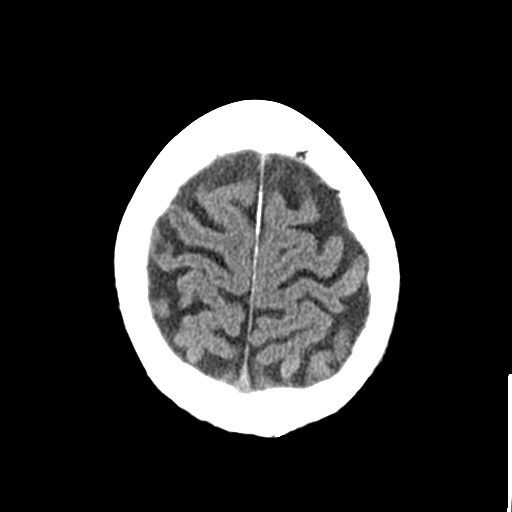
[im 25/30  brain]
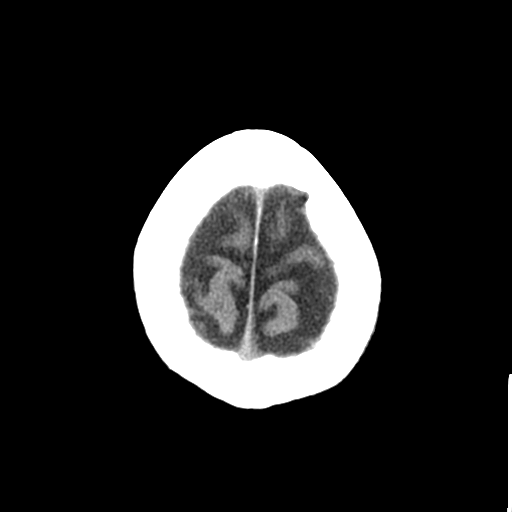
[im 25/30  bone]
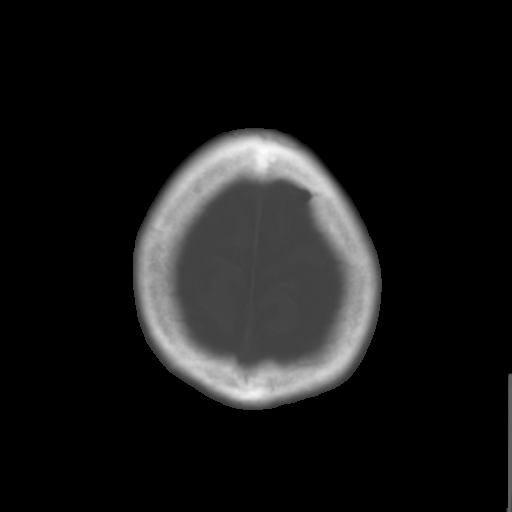
[im 28/30  brain]
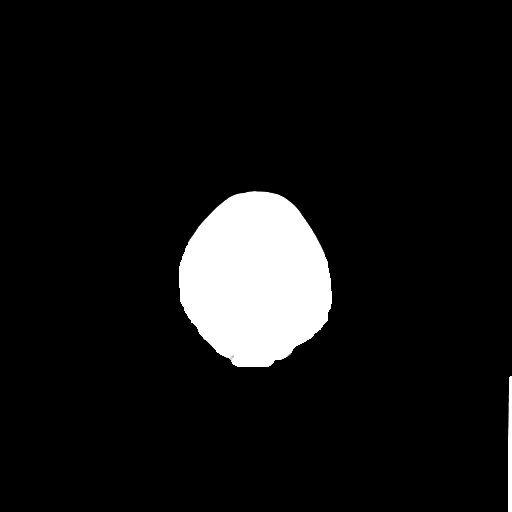

[Series 4: coronal soft tissue · coronal · 0.33mm/px · 3 of 67 slices shown]
[im 23/67  brain]
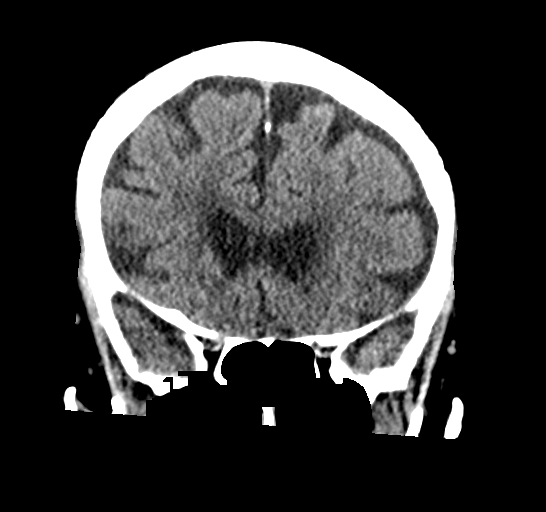
[im 30/67  brain]
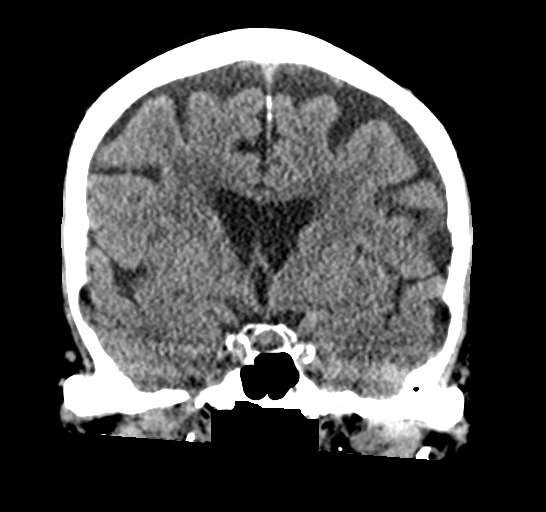
[im 37/67  brain]
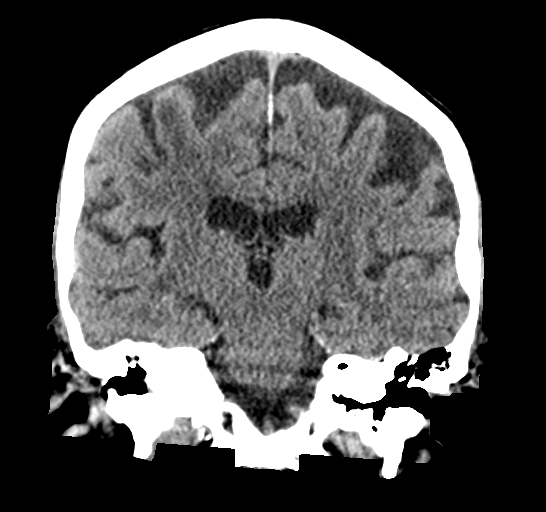

[Series 5: sagittal soft tissue · sagittal · 0.33mm/px · 3 of 60 slices shown]
[im 20/60  brain]
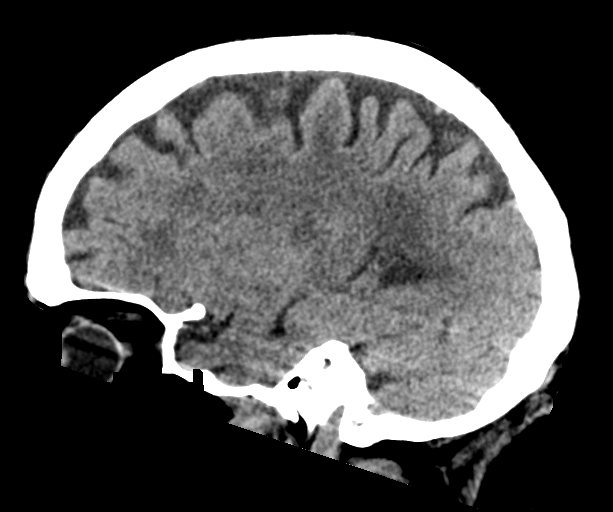
[im 30/60  brain]
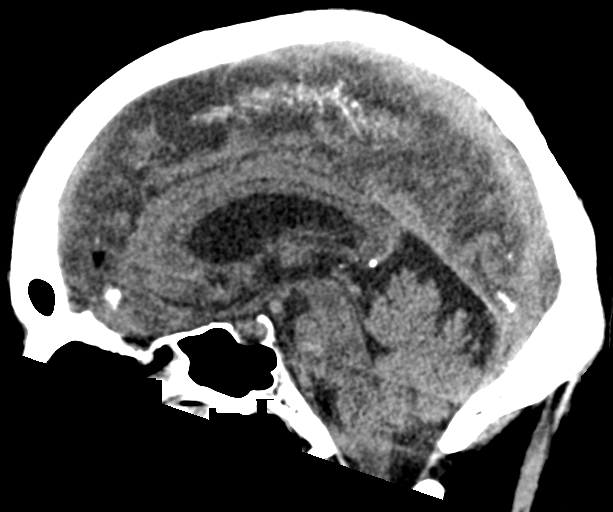
[im 40/60  brain]
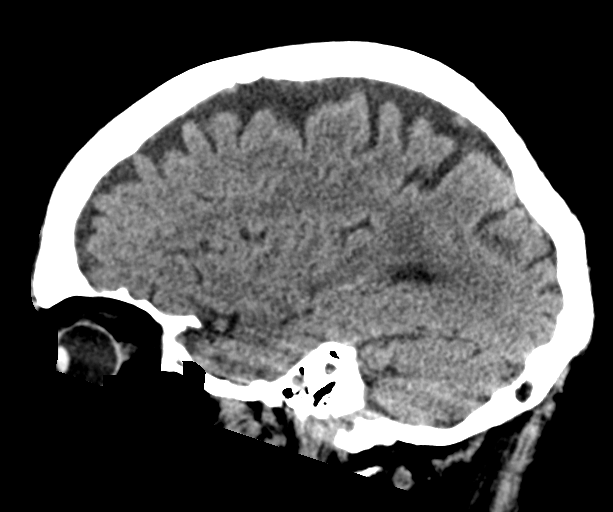

[16 of 47 positions shown; findings below may reference images not displayed]

FINDINGS: Brain: Diffuse cerebral atrophy. Ventricular dilatation consistent
with central atrophy. Low-attenuation changes in the deep white
matter consistent with small vessel ischemia. No abnormal
extra-axial fluid collections. No mass effect or midline shift.
Gray-white matter junctions are distinct. Basal cisterns are not
effaced. No acute intracranial hemorrhage.

Vascular: Vascular calcifications are present.

Skull: Calvarium appears intact.

Sinuses/Orbits: Paranasal sinuses are clear. Opacification of the
right mastoid air cells with loss of internal septation.

Other: None.
IMPRESSION: 1. No acute intracranial abnormalities.
2. Chronic atrophy and small vessel ischemia.
3. Right mastoid effusions.

## 2021-05-17 DIAGNOSIS — Z6827 Body mass index (BMI) 27.0-27.9, adult: Secondary | ICD-10-CM | POA: Diagnosis not present

## 2021-05-17 DIAGNOSIS — L84 Corns and callosities: Secondary | ICD-10-CM | POA: Diagnosis not present

## 2021-05-17 DIAGNOSIS — L03115 Cellulitis of right lower limb: Secondary | ICD-10-CM | POA: Diagnosis not present

## 2021-05-19 DIAGNOSIS — E039 Hypothyroidism, unspecified: Secondary | ICD-10-CM | POA: Diagnosis not present

## 2021-05-19 DIAGNOSIS — L03115 Cellulitis of right lower limb: Secondary | ICD-10-CM | POA: Diagnosis not present

## 2021-05-19 DIAGNOSIS — I10 Essential (primary) hypertension: Secondary | ICD-10-CM | POA: Diagnosis not present

## 2021-05-19 DIAGNOSIS — E782 Mixed hyperlipidemia: Secondary | ICD-10-CM | POA: Diagnosis not present

## 2021-05-19 DIAGNOSIS — R600 Localized edema: Secondary | ICD-10-CM | POA: Diagnosis not present

## 2021-05-19 DIAGNOSIS — Z6827 Body mass index (BMI) 27.0-27.9, adult: Secondary | ICD-10-CM | POA: Diagnosis not present

## 2021-05-23 DIAGNOSIS — E782 Mixed hyperlipidemia: Secondary | ICD-10-CM | POA: Diagnosis not present

## 2021-05-23 DIAGNOSIS — G40909 Epilepsy, unspecified, not intractable, without status epilepticus: Secondary | ICD-10-CM | POA: Diagnosis not present

## 2021-05-23 DIAGNOSIS — L03115 Cellulitis of right lower limb: Secondary | ICD-10-CM | POA: Diagnosis not present

## 2021-05-23 DIAGNOSIS — R609 Edema, unspecified: Secondary | ICD-10-CM | POA: Diagnosis not present

## 2021-05-23 DIAGNOSIS — L84 Corns and callosities: Secondary | ICD-10-CM | POA: Diagnosis not present

## 2021-05-23 DIAGNOSIS — Z8673 Personal history of transient ischemic attack (TIA), and cerebral infarction without residual deficits: Secondary | ICD-10-CM | POA: Diagnosis not present

## 2021-05-23 DIAGNOSIS — I10 Essential (primary) hypertension: Secondary | ICD-10-CM | POA: Diagnosis not present

## 2021-05-23 DIAGNOSIS — N400 Enlarged prostate without lower urinary tract symptoms: Secondary | ICD-10-CM | POA: Diagnosis not present

## 2021-05-23 DIAGNOSIS — Z0001 Encounter for general adult medical examination with abnormal findings: Secondary | ICD-10-CM | POA: Diagnosis not present

## 2021-05-23 DIAGNOSIS — Z6825 Body mass index (BMI) 25.0-25.9, adult: Secondary | ICD-10-CM | POA: Diagnosis not present

## 2021-05-23 DIAGNOSIS — E039 Hypothyroidism, unspecified: Secondary | ICD-10-CM | POA: Diagnosis not present

## 2021-08-29 DIAGNOSIS — R2689 Other abnormalities of gait and mobility: Secondary | ICD-10-CM | POA: Diagnosis not present

## 2021-08-29 DIAGNOSIS — R569 Unspecified convulsions: Secondary | ICD-10-CM | POA: Diagnosis not present

## 2021-08-29 DIAGNOSIS — Z8673 Personal history of transient ischemic attack (TIA), and cerebral infarction without residual deficits: Secondary | ICD-10-CM | POA: Diagnosis not present

## 2021-09-23 ENCOUNTER — Inpatient Hospital Stay: Payer: PPO

## 2021-09-23 ENCOUNTER — Other Ambulatory Visit: Payer: PPO

## 2021-09-23 ENCOUNTER — Ambulatory Visit: Payer: PPO | Admitting: Nurse Practitioner

## 2021-09-23 ENCOUNTER — Inpatient Hospital Stay: Payer: PPO | Admitting: Nurse Practitioner

## 2021-10-03 ENCOUNTER — Inpatient Hospital Stay: Payer: PPO | Attending: Internal Medicine | Admitting: Oncology

## 2021-10-03 ENCOUNTER — Other Ambulatory Visit: Payer: Self-pay

## 2021-10-03 ENCOUNTER — Encounter: Payer: Self-pay | Admitting: Oncology

## 2021-10-03 ENCOUNTER — Inpatient Hospital Stay: Payer: PPO

## 2021-10-03 VITALS — BP 117/90 | HR 89 | Temp 96.0°F | Resp 18 | Wt 172.0 lb

## 2021-10-03 DIAGNOSIS — D631 Anemia in chronic kidney disease: Secondary | ICD-10-CM | POA: Diagnosis not present

## 2021-10-03 DIAGNOSIS — Z885 Allergy status to narcotic agent status: Secondary | ICD-10-CM | POA: Diagnosis not present

## 2021-10-03 DIAGNOSIS — K5900 Constipation, unspecified: Secondary | ICD-10-CM | POA: Insufficient documentation

## 2021-10-03 DIAGNOSIS — Z8249 Family history of ischemic heart disease and other diseases of the circulatory system: Secondary | ICD-10-CM | POA: Insufficient documentation

## 2021-10-03 DIAGNOSIS — I129 Hypertensive chronic kidney disease with stage 1 through stage 4 chronic kidney disease, or unspecified chronic kidney disease: Secondary | ICD-10-CM | POA: Insufficient documentation

## 2021-10-03 DIAGNOSIS — D693 Immune thrombocytopenic purpura: Secondary | ICD-10-CM | POA: Diagnosis not present

## 2021-10-03 DIAGNOSIS — N183 Chronic kidney disease, stage 3 unspecified: Secondary | ICD-10-CM | POA: Diagnosis not present

## 2021-10-03 DIAGNOSIS — Z79899 Other long term (current) drug therapy: Secondary | ICD-10-CM | POA: Diagnosis not present

## 2021-10-03 DIAGNOSIS — Z9049 Acquired absence of other specified parts of digestive tract: Secondary | ICD-10-CM | POA: Diagnosis not present

## 2021-10-03 DIAGNOSIS — Z8673 Personal history of transient ischemic attack (TIA), and cerebral infarction without residual deficits: Secondary | ICD-10-CM | POA: Insufficient documentation

## 2021-10-03 DIAGNOSIS — Z8379 Family history of other diseases of the digestive system: Secondary | ICD-10-CM | POA: Insufficient documentation

## 2021-10-03 LAB — BASIC METABOLIC PANEL
Anion gap: 6 (ref 5–15)
BUN: 19 mg/dL (ref 8–23)
CO2: 28 mmol/L (ref 22–32)
Calcium: 8 mg/dL — ABNORMAL LOW (ref 8.9–10.3)
Chloride: 101 mmol/L (ref 98–111)
Creatinine, Ser: 1.23 mg/dL (ref 0.61–1.24)
GFR, Estimated: 57 mL/min — ABNORMAL LOW (ref >60)
Glucose, Bld: 101 mg/dL — ABNORMAL HIGH (ref 70–99)
Potassium: 4.3 mmol/L (ref 3.5–5.1)
Sodium: 135 mmol/L (ref 135–145)

## 2021-10-03 LAB — IRON AND TIBC
Iron: 54 ug/dL (ref 45–182)
Saturation Ratios: 23 % (ref 17.9–39.5)
TIBC: 234 ug/dL — ABNORMAL LOW (ref 250–450)
UIBC: 180 ug/dL

## 2021-10-03 LAB — CBC WITH DIFFERENTIAL/PLATELET
Abs Immature Granulocytes: 0.03 10*3/uL (ref 0.00–0.07)
Basophils Absolute: 0 10*3/uL (ref 0.0–0.1)
Basophils Relative: 0 %
Eosinophils Absolute: 0.1 10*3/uL (ref 0.0–0.5)
Eosinophils Relative: 1 %
HCT: 36.3 % — ABNORMAL LOW (ref 39.0–52.0)
Hemoglobin: 12 g/dL — ABNORMAL LOW (ref 13.0–17.0)
Immature Granulocytes: 0 %
Lymphocytes Relative: 14 %
Lymphs Abs: 1.2 10*3/uL (ref 0.7–4.0)
MCH: 29.7 pg (ref 26.0–34.0)
MCHC: 33.1 g/dL (ref 30.0–36.0)
MCV: 89.9 fL (ref 80.0–100.0)
Monocytes Absolute: 0.6 10*3/uL (ref 0.1–1.0)
Monocytes Relative: 8 %
Neutro Abs: 6.2 10*3/uL (ref 1.7–7.7)
Neutrophils Relative %: 77 %
Platelets: 162 10*3/uL (ref 150–400)
RBC: 4.04 MIL/uL — ABNORMAL LOW (ref 4.22–5.81)
RDW: 14.7 % (ref 11.5–15.5)
WBC: 8.1 10*3/uL (ref 4.0–10.5)
nRBC: 0 % (ref 0.0–0.2)

## 2021-10-03 LAB — FERRITIN: Ferritin: 24 ng/mL (ref 24–336)

## 2021-10-03 NOTE — Progress Notes (Signed)
[facility] OFFICE PROGRESS NOTE  Patient Care Team: Lynnell Jude, MD as PCP - General (Family Medicine) Kate Sable, MD as PCP - Cardiology (Cardiology) Cammie Sickle, MD as Consulting Physician (Hematology and Oncology)   SUMMARY OF ONCOLOGIC HISTORY: # SEP 2015- ITP [platelets- 16]; s/p prednisone ; Good Response; currently surveillance  # SEP 2015- Mild Anemia/IDA-CKD-III- ~12 s/p EGD/colo [Oct 2015; Dr.Skulskie] ; Ferritin- 17; NO BMBx;   # CKD creatinine ~1.4 [no nephro] ; 2021-new onset of Seziues   INTERVAL HISTORY: [name] is an 87 year old male with above past medical history who is here for follow-up for ITP.  He denies any bleeding.  Denies any dark or bloody stools.  He has been compliant with iron supplements although it is causing constipation.  Denies any falls.  Review of Systems  Constitutional: Negative.  Negative for chills, fever, malaise/fatigue and weight loss.  HENT:  Negative for congestion, ear pain and tinnitus.   Eyes: Negative.  Negative for blurred vision and double vision.  Respiratory: Negative.  Negative for cough, sputum production and shortness of breath.   Cardiovascular: Negative.  Negative for chest pain, palpitations and leg swelling.  Gastrointestinal:  Positive for constipation. Negative for abdominal pain, diarrhea, nausea and vomiting.  Genitourinary:  Negative for dysuria, frequency and urgency.  Musculoskeletal:  Negative for back pain and falls.  Skin: Negative.  Negative for rash.  Neurological: Negative.  Negative for weakness and headaches.  Endo/Heme/Allergies: Negative.  Does not bruise/bleed easily.  Psychiatric/Behavioral: Negative.  Negative for depression. The patient is not nervous/anxious and does not have insomnia.     PAST MEDICAL HISTORY :  Past Medical History:  Diagnosis Date   Allergic rhinitis    Arthropathy    B12 deficiency anemia    Chronic neck pain    CVA (cerebral infarction)     GERD (gastroesophageal reflux disease)    History of seizures    HTN (hypertension)    IDA (iron deficiency anemia)    ITP (idiopathic thrombocytopenic purpura) 07/16/14   Wears dentures    partial upper and lower    PAST SURGICAL HISTORY :   Past Surgical History:  Procedure Laterality Date   CHOLECYSTECTOMY  10/13/1988   COLONOSCOPY  10/2014, 08/2014   Dr. Gwenlyn Perking   MYRINGOTOMY WITH TUBE PLACEMENT Bilateral 01/27/2021   Procedure: MYRINGOTOMY WITH TUBE PLACEMENT;  Surgeon: Margaretha Sheffield, MD;  Location: [facility];  Service: ENT;  Laterality: Bilateral;   UPPER GI ENDOSCOPY      FAMILY HISTORY :   Family History  Problem Relation Age of Onset   Heart disease Other    Cirrhosis Mother     SOCIAL HISTORY:   Social History   Tobacco Use   Smoking status: Never   Smokeless tobacco: Never  Vaping Use   Vaping Use: Never used  Substance Use Topics   Alcohol use: No    Alcohol/week: 0.0 standard drinks   Drug use: No    ALLERGIES:  is allergic to meperidine.  MEDICATIONS:  Current Outpatient Medications  Medication Sig Dispense Refill   aspirin EC 81 MG tablet Take 81 mg by mouth 2 (two) times daily.      Cyanocobalamin (VITAMIN B12 PO) Take by mouth daily.     Docusate Sodium (STOOL SOFTENER LAXATIVE PO) Take by mouth.     ferrous sulfate 325 (65 FE) MG tablet Take by mouth.     Ibuprofen (ADVIL LIQUI-GELS MINIS) 200 MG CAPS Take 2  capsules (400 mg total) by mouth at bedtime as needed. 120 capsule 0   levETIRAcetam (KEPPRA) 500 MG tablet Take 1 tablet (500 mg total) by mouth 2 (two) times daily. Seizure prevention. 60 tablet 2   levothyroxine (SYNTHROID) 50 MCG tablet Take 50 mcg by mouth daily.     nystatin cream (MYCOSTATIN) Apply topically 2 (two) times daily.     Probiotic Product ([place]) Take by mouth daily.     RABEprazole (ACIPHEX) 20 MG tablet Take 1 tablet by mouth daily. 1 hour before meals     VITAMIN D PO Take by mouth daily.      No current facility-administered medications for this visit.    PHYSICAL EXAMINATION:   BP 117/90 (Patient Position: Sitting)   Pulse 89   Temp (!) 96 F (35.6 C) (Tympanic)   Resp 18   Wt 172 lb (78 kg)   SpO2 98%   BMI 23.99 kg/m   Filed Weights   10/03/21 1426  Weight: 172 lb (78 kg)    Physical Exam Constitutional:      Appearance: Normal appearance.  HENT:     Head: Normocephalic and atraumatic.  Eyes:     Pupils: Pupils are equal, round, and reactive to light.  Cardiovascular:     Rate and Rhythm: Normal rate and regular rhythm.     Heart sounds: Normal heart sounds. No murmur heard. Pulmonary:     Effort: Pulmonary effort is normal.     Breath sounds: Normal breath sounds. No wheezing.  Abdominal:     General: Bowel sounds are normal. There is no distension.     Palpations: Abdomen is soft.     Tenderness: There is no abdominal tenderness.  Musculoskeletal:        General: Normal range of motion.     Cervical back: Normal range of motion.  Skin:    General: Skin is warm and dry.     Findings: No rash.  Neurological:     Mental Status: He is alert and oriented to person, place, and time.     Gait: Gait is intact.  Psychiatric:        Mood and Affect: Mood and affect normal.        Cognition and Memory: Memory normal.        Judgment: Judgment normal.    LABORATORY DATA:  I have reviewed the data as listed    Component Value Date/Time   NA 138 03/22/2021 1312   NA 137 02/24/2015 1131   K 3.7 03/22/2021 1312   K 4.1 02/24/2015 1131   CL 102 03/22/2021 1312   CL 103 02/24/2015 1131   CO2 25 03/22/2021 1312   CO2 29 02/24/2015 1131   GLUCOSE 113 (H) 03/22/2021 1312   GLUCOSE 135 (H) 02/24/2015 1131   BUN 14 03/22/2021 1312   BUN 14 02/24/2015 1131   CREATININE 1.27 (H) 03/22/2021 1312   CREATININE 1.32 (H) 02/24/2015 1131   CALCIUM 7.7 (L) 03/22/2021 1312   CALCIUM 8.8 (L) 02/24/2015 1131   PROT 6.4 (L) 06/18/2020 0409   PROT 6.5  02/24/2015 1131   ALBUMIN 3.7 06/18/2020 0409   ALBUMIN 3.9 02/24/2015 1131   AST 20 06/18/2020 0409   AST 19 02/24/2015 1131   ALT 11 06/18/2020 0409   ALT 11 (L) 02/24/2015 1131   ALKPHOS 59 06/18/2020 0409   ALKPHOS 43 02/24/2015 1131   BILITOT 0.8 06/18/2020 0409   BILITOT 0.9 02/24/2015 1131  GFRNONAA 55 (L) 03/22/2021 1312   GFRNONAA 50 (L) 02/24/2015 1131   GFRAA >60 06/20/2020 0506   GFRAA 58 (L) 02/24/2015 1131    No results found for: SPEP, UPEP  Lab Results  Component Value Date   WBC 8.1 10/03/2021   NEUTROABS 6.2 10/03/2021   HGB 12.0 (L) 10/03/2021   HCT 36.3 (L) 10/03/2021   MCV 89.9 10/03/2021   PLT 162 10/03/2021      Chemistry      Component Value Date/Time   NA 138 03/22/2021 1312   NA 137 02/24/2015 1131   K 3.7 03/22/2021 1312   K 4.1 02/24/2015 1131   CL 102 03/22/2021 1312   CL 103 02/24/2015 1131   CO2 25 03/22/2021 1312   CO2 29 02/24/2015 1131   BUN 14 03/22/2021 1312   BUN 14 02/24/2015 1131   CREATININE 1.27 (H) 03/22/2021 1312   CREATININE 1.32 (H) 02/24/2015 1131      Component Value Date/Time   CALCIUM 7.7 (L) 03/22/2021 1312   CALCIUM 8.8 (L) 02/24/2015 1131   ALKPHOS 59 06/18/2020 0409   ALKPHOS 43 02/24/2015 1131   AST 20 06/18/2020 0409   AST 19 02/24/2015 1131   ALT 11 06/18/2020 0409   ALT 11 (L) 02/24/2015 1131   BILITOT 0.8 06/18/2020 0409   BILITOT 0.9 02/24/2015 1131        ASSESSMENT & PLAN:   ITP- Responsive to steroids.  Currently on surveillance and overall stable.  Labs from today show platelet count of 162,000.  Continue surveillance every 26-month.  IDA- Currently taking iron supplements daily.  Has been compliant although its been causing constipation.  Hemoglobin has improved and is 12.0 today.  Can take every other day if causing constipation.  Ferritin and iron panel is pending.  CKD stage III- Stable.  Constipation- Take iron every other day.  Can take OTC MiraLAX or  Senokot.  Disposition- Return to clinic in 6 months for lab work and see Dr. Rogue Bussing.  I spent 25 minutes dedicated to the care of this patient (face-to-face and non-face-to-face) on the date of the encounter to include what is described in the assessment and plan.  No problem-specific Assessment & Plan notes found for this encounter.    Jacquelin Hawking, NP 10/03/2021 2:49 PM

## 2021-10-03 NOTE — Progress Notes (Signed)
Patient here for oncology follow-up appointment,  no new concerns at this time.    

## 2022-04-03 ENCOUNTER — Other Ambulatory Visit: Payer: Self-pay

## 2022-04-03 DIAGNOSIS — D693 Immune thrombocytopenic purpura: Secondary | ICD-10-CM

## 2022-04-04 ENCOUNTER — Inpatient Hospital Stay: Payer: PPO | Admitting: Internal Medicine

## 2022-04-04 ENCOUNTER — Inpatient Hospital Stay: Payer: PPO

## 2022-04-04 NOTE — Progress Notes (Unsigned)
[facility] OFFICE PROGRESS NOTE  Patient Care Team: Lynnell Jude, MD as PCP - General (Family Medicine) Kate Sable, MD as PCP - Cardiology (Cardiology) Cammie Sickle, MD as Consulting Physician (Hematology and Oncology)   SUMMARY OF ONCOLOGIC HISTORY:  # SEP 2015- ITP [platelets- 16]; s/p prednisone ; Good Response; currently surveillance  # SEP 2015- Mild Anemia/IDA-CKD-III- ~12 s/p EGD/colo [Oct 2015; Dr.Skulskie] ; Ferritin- 17; NO BMBx;   # CKD creatinine ~1.4 [no nephro] ; 2021-new onset of Seziues   INTERVAL HISTORY:  88 year old male patient with history of ITP-is here for follow-up/currently on surveillance.   Patient denies gum bleeding or nosebleeds.  Denies any blood in stools or black or stools.  Denies any further episodes of seizures.  Patient interim had ear surgery/had tubes placed in his ear because of effusion.  Patient complains of mild dizziness this morning.  No falls.  Review of Systems  Constitutional:  Negative for chills, diaphoresis, fever and malaise/fatigue.  HENT:  Negative for nosebleeds and sore throat.   Eyes:  Negative for double vision.  Respiratory:  Negative for cough, hemoptysis, sputum production, shortness of breath and wheezing.   Cardiovascular:  Negative for chest pain, palpitations, orthopnea and leg swelling.  Gastrointestinal:  Negative for abdominal pain, blood in stool, constipation, diarrhea, heartburn, melena, nausea and vomiting.  Genitourinary:  Negative for dysuria.  Musculoskeletal:  Negative for back pain and joint pain.  Skin: Negative.  Negative for itching and rash.  Neurological:  Negative for dizziness, tingling, focal weakness, weakness and headaches.  Endo/Heme/Allergies:  Does not bruise/bleed easily.  Psychiatric/Behavioral:  Negative for depression. The patient is not nervous/anxious and does not have insomnia.      PAST MEDICAL HISTORY :  Past Medical History:  Diagnosis Date   . Allergic rhinitis   . Arthropathy   . B12 deficiency anemia   . Chronic neck pain   . CVA (cerebral infarction)   . GERD (gastroesophageal reflux disease)   . History of seizures   . HTN (hypertension)   . IDA (iron deficiency anemia)   . ITP (idiopathic thrombocytopenic purpura) 07/16/14  . Wears dentures    partial upper and lower    PAST SURGICAL HISTORY :   Past Surgical History:  Procedure Laterality Date  . CHOLECYSTECTOMY  10/13/1988  . COLONOSCOPY  10/2014, 08/2014   Dr. Gwenlyn Perking  . MYRINGOTOMY WITH TUBE PLACEMENT Bilateral 01/27/2021   Procedure: MYRINGOTOMY WITH TUBE PLACEMENT;  Surgeon: Margaretha Sheffield, MD;  Location: [facility];  Service: ENT;  Laterality: Bilateral;  . UPPER GI ENDOSCOPY      FAMILY HISTORY :   Family History  Problem Relation Age of Onset  . Heart disease Other   . Cirrhosis Mother     SOCIAL HISTORY:   Social History   Tobacco Use  . Smoking status: Never  . Smokeless tobacco: Never  Vaping Use  . Vaping Use: Never used  Substance Use Topics  . Alcohol use: No    Alcohol/week: 0.0 standard drinks  . Drug use: No    ALLERGIES:  is allergic to meperidine.  MEDICATIONS:  Current Outpatient Medications  Medication Sig Dispense Refill  . aspirin EC 81 MG tablet Take 81 mg by mouth 2 (two) times daily.     . Cyanocobalamin (VITAMIN B12 PO) Take by mouth daily.    Mariane Baumgarten Sodium (STOOL SOFTENER LAXATIVE PO) Take by mouth.    . ferrous sulfate 325 (65 FE) MG tablet  Take by mouth.    . Ibuprofen (ADVIL LIQUI-GELS MINIS) 200 MG CAPS Take 2 capsules (400 mg total) by mouth at bedtime as needed. 120 capsule 0  . levETIRAcetam (KEPPRA) 500 MG tablet Take 1 tablet (500 mg total) by mouth 2 (two) times daily. Seizure prevention. 60 tablet 2  . levothyroxine (SYNTHROID) 50 MCG tablet Take 50 mcg by mouth daily.    Marland Kitchen nystatin cream (MYCOSTATIN) Apply topically 2 (two) times daily.    . Probiotic Product ([place])  Take by mouth daily.    . RABEprazole (ACIPHEX) 20 MG tablet Take 1 tablet by mouth daily. 1 hour before meals    . VITAMIN D PO Take by mouth daily.     No current facility-administered medications for this visit.    PHYSICAL EXAMINATION:   There were no vitals taken for this visit.  There were no vitals filed for this visit.   Physical Exam Constitutional:      Comments: He is walking himself.  HENT:     Head: Normocephalic and atraumatic.     Mouth/Throat:     Pharynx: No oropharyngeal exudate.  Eyes:     Pupils: Pupils are equal, round, and reactive to light.  Cardiovascular:     Rate and Rhythm: Normal rate and regular rhythm.  Pulmonary:     Effort: No respiratory distress.     Breath sounds: No wheezing.  Abdominal:     General: Bowel sounds are normal. There is no distension.     Palpations: Abdomen is soft. There is no mass.     Tenderness: There is no abdominal tenderness. There is no guarding or rebound.  Musculoskeletal:        General: No tenderness. Normal range of motion.     Cervical back: Normal range of motion and neck supple.  Skin:    General: Skin is warm.  Neurological:     Mental Status: He is alert and oriented to person, place, and time.  Psychiatric:        Mood and Affect: Affect normal.    LABORATORY DATA:  I have reviewed the data as listed    Component Value Date/Time   NA 135 10/03/2021 1407   NA 137 02/24/2015 1131   K 4.3 10/03/2021 1407   K 4.1 02/24/2015 1131   CL 101 10/03/2021 1407   CL 103 02/24/2015 1131   CO2 28 10/03/2021 1407   CO2 29 02/24/2015 1131   GLUCOSE 101 (H) 10/03/2021 1407   GLUCOSE 135 (H) 02/24/2015 1131   BUN 19 10/03/2021 1407   BUN 14 02/24/2015 1131   CREATININE 1.23 10/03/2021 1407   CREATININE 1.32 (H) 02/24/2015 1131   CALCIUM 8.0 (L) 10/03/2021 1407   CALCIUM 8.8 (L) 02/24/2015 1131   PROT 6.4 (L) 06/18/2020 0409   PROT 6.5 02/24/2015 1131   ALBUMIN 3.7 06/18/2020 0409   ALBUMIN 3.9  02/24/2015 1131   AST 20 06/18/2020 0409   AST 19 02/24/2015 1131   ALT 11 06/18/2020 0409   ALT 11 (L) 02/24/2015 1131   ALKPHOS 59 06/18/2020 0409   ALKPHOS 43 02/24/2015 1131   BILITOT 0.8 06/18/2020 0409   BILITOT 0.9 02/24/2015 1131   GFRNONAA 57 (L) 10/03/2021 1407   GFRNONAA 50 (L) 02/24/2015 1131   GFRAA >60 06/20/2020 0506   GFRAA 58 (L) 02/24/2015 1131    No results found for: SPEP, UPEP  Lab Results  Component Value Date   WBC 8.1 10/03/2021  NEUTROABS 6.2 10/03/2021   HGB 12.0 (L) 10/03/2021   HCT 36.3 (L) 10/03/2021   MCV 89.9 10/03/2021   PLT 162 10/03/2021      Chemistry      Component Value Date/Time   NA 135 10/03/2021 1407   NA 137 02/24/2015 1131   K 4.3 10/03/2021 1407   K 4.1 02/24/2015 1131   CL 101 10/03/2021 1407   CL 103 02/24/2015 1131   CO2 28 10/03/2021 1407   CO2 29 02/24/2015 1131   BUN 19 10/03/2021 1407   BUN 14 02/24/2015 1131   CREATININE 1.23 10/03/2021 1407   CREATININE 1.32 (H) 02/24/2015 1131      Component Value Date/Time   CALCIUM 8.0 (L) 10/03/2021 1407   CALCIUM 8.8 (L) 02/24/2015 1131   ALKPHOS 59 06/18/2020 0409   ALKPHOS 43 02/24/2015 1131   AST 20 06/18/2020 0409   AST 19 02/24/2015 1131   ALT 11 06/18/2020 0409   ALT 11 (L) 02/24/2015 1131   BILITOT 0.8 06/18/2020 0409   BILITOT 0.9 02/24/2015 1131        ASSESSMENT & PLAN:   Idiopathic thrombocytopenic purpura ([place]) # ITP-Steroid responsive.  Currently on surveillance.overall STABLE; Platelets of 142 Continue surveillance.   # IDA-  Mild anemia/likely from CKD- Hb 10.7; non-compliant with PO iron. Recommend PO iron.  STABLE. Re-check Iron levels.  Recommend p.o. iron.  # CKD-stage III [GFR-54]  STABLE; recommend hydration.   #Seizures -STABLE; on keppra.   # Dizziness [recent ear surgery; s/p tube]-recommend continued hydration.  If not improved recommend evaluation with ENT.  #DISPOSITION:  # follow up in 6 months- MD;  [cbcbmp/ Iron studies/  ferritin]- Dr.B   Dr.Bliss    Cammie Sickle, MD 04/04/2022 12:59 PM

## 2022-04-04 NOTE — Assessment & Plan Note (Signed)
#   ITP-Steroid responsive.  Currently on surveillance.overall STABLE; Platelets of 142 Continue surveillance.   # IDA-  Mild anemia/likely from CKD- Hb 10.7; non-compliant with PO iron. Recommend PO iron.  STABLE. Re-check Iron levels.  Recommend p.o. iron.  # CKD-stage III [GFR-54]  STABLE; recommend hydration.   #Seizures -STABLE; on keppra.   # Dizziness [recent ear surgery; s/p tube]-recommend continued hydration.  If not improved recommend evaluation with ENT.  #DISPOSITION:  # follow up in 6 months- MD;  [cbcbmp/ Iron studies/ ferritin]- Dr.B   Dr.Bliss

## 2022-04-17 ENCOUNTER — Inpatient Hospital Stay: Payer: PPO | Attending: Internal Medicine

## 2022-04-17 ENCOUNTER — Inpatient Hospital Stay: Payer: PPO | Admitting: Internal Medicine

## 2022-04-17 DIAGNOSIS — E611 Iron deficiency: Secondary | ICD-10-CM | POA: Insufficient documentation

## 2022-04-17 DIAGNOSIS — Z8379 Family history of other diseases of the digestive system: Secondary | ICD-10-CM | POA: Insufficient documentation

## 2022-04-17 DIAGNOSIS — Z8249 Family history of ischemic heart disease and other diseases of the circulatory system: Secondary | ICD-10-CM | POA: Insufficient documentation

## 2022-04-17 DIAGNOSIS — Z79890 Hormone replacement therapy (postmenopausal): Secondary | ICD-10-CM | POA: Insufficient documentation

## 2022-04-17 DIAGNOSIS — D693 Immune thrombocytopenic purpura: Secondary | ICD-10-CM | POA: Insufficient documentation

## 2022-04-17 DIAGNOSIS — Z885 Allergy status to narcotic agent status: Secondary | ICD-10-CM | POA: Insufficient documentation

## 2022-04-17 DIAGNOSIS — Z8673 Personal history of transient ischemic attack (TIA), and cerebral infarction without residual deficits: Secondary | ICD-10-CM | POA: Insufficient documentation

## 2022-04-17 DIAGNOSIS — Z9049 Acquired absence of other specified parts of digestive tract: Secondary | ICD-10-CM | POA: Insufficient documentation

## 2022-04-17 DIAGNOSIS — N1831 Chronic kidney disease, stage 3a: Secondary | ICD-10-CM | POA: Insufficient documentation

## 2022-04-17 DIAGNOSIS — Z79899 Other long term (current) drug therapy: Secondary | ICD-10-CM | POA: Insufficient documentation

## 2022-04-17 DIAGNOSIS — D631 Anemia in chronic kidney disease: Secondary | ICD-10-CM | POA: Insufficient documentation

## 2022-04-17 NOTE — Progress Notes (Deleted)
[name] OFFICE PROGRESS NOTE  Patient Care Team: Lynnell Jude, MD as PCP - General (Family Medicine) Kate Sable, MD as PCP - Cardiology (Cardiology) Cammie Sickle, MD as Consulting Physician (Hematology and Oncology)   SUMMARY OF ONCOLOGIC HISTORY:  # SEP 2015- ITP [platelets- 16]; s/p prednisone ; Good Response; currently surveillance  # SEP 2015- Mild Anemia/IDA-CKD-III- ~12 s/p EGD/colo [Oct 2015; Dr.Skulskie] ; Ferritin- 17; NO BMBx;   # CKD creatinine ~1.4 [no nephro] ; 2021-new onset of Seziues   INTERVAL HISTORY:  88 year old male patient with history of ITP-is here for follow-up/currently on surveillance.   Patient denies gum bleeding or nosebleeds.  Denies any blood in stools or black or stools.  Denies any further episodes of seizures.  Patient interim had ear surgery/had tubes placed in his ear because of effusion.  Patient complains of mild dizziness this morning.  No falls.  Review of Systems  Constitutional:  Negative for chills, diaphoresis, fever and malaise/fatigue.  HENT:  Negative for nosebleeds and sore throat.   Eyes:  Negative for double vision.  Respiratory:  Negative for cough, hemoptysis, sputum production, shortness of breath and wheezing.   Cardiovascular:  Negative for chest pain, palpitations, orthopnea and leg swelling.  Gastrointestinal:  Negative for abdominal pain, blood in stool, constipation, diarrhea, heartburn, melena, nausea and vomiting.  Genitourinary:  Negative for dysuria.  Musculoskeletal:  Negative for back pain and joint pain.  Skin: Negative.  Negative for itching and rash.  Neurological:  Negative for dizziness, tingling, focal weakness, weakness and headaches.  Endo/Heme/Allergies:  Does not bruise/bleed easily.  Psychiatric/Behavioral:  Negative for depression. The patient is not nervous/anxious and does not have insomnia.      PAST MEDICAL HISTORY :  Past Medical History:  Diagnosis Date   . Allergic rhinitis   . Arthropathy   . B12 deficiency anemia   . Chronic neck pain   . CVA (cerebral infarction)   . GERD (gastroesophageal reflux disease)   . History of seizures   . HTN (hypertension)   . IDA (iron deficiency anemia)   . ITP (idiopathic thrombocytopenic purpura) 07/16/14  . Wears dentures    partial upper and lower    PAST SURGICAL HISTORY :   Past Surgical History:  Procedure Laterality Date  . CHOLECYSTECTOMY  10/13/1988  . COLONOSCOPY  10/2014, 08/2014   Dr. Gwenlyn Perking  . MYRINGOTOMY WITH TUBE PLACEMENT Bilateral 01/27/2021   Procedure: MYRINGOTOMY WITH TUBE PLACEMENT;  Surgeon: Margaretha Sheffield, MD;  Location: [facility];  Service: ENT;  Laterality: Bilateral;  . UPPER GI ENDOSCOPY      FAMILY HISTORY :   Family History  Problem Relation Age of Onset  . Heart disease Other   . Cirrhosis Mother     SOCIAL HISTORY:   Social History   Tobacco Use  . Smoking status: Never  . Smokeless tobacco: Never  Vaping Use  . Vaping Use: Never used  Substance Use Topics  . Alcohol use: No    Alcohol/week: 0.0 standard drinks  . Drug use: No    ALLERGIES:  is allergic to meperidine.  MEDICATIONS:  Current Outpatient Medications  Medication Sig Dispense Refill  . aspirin EC 81 MG tablet Take 81 mg by mouth 2 (two) times daily.     . Cyanocobalamin (VITAMIN B12 PO) Take by mouth daily.    Mariane Baumgarten Sodium (STOOL SOFTENER LAXATIVE PO) Take by mouth.    . ferrous sulfate 325 (65 FE) MG tablet  Take by mouth.    . Ibuprofen (ADVIL LIQUI-GELS MINIS) 200 MG CAPS Take 2 capsules (400 mg total) by mouth at bedtime as needed. 120 capsule 0  . levETIRAcetam (KEPPRA) 500 MG tablet Take 1 tablet (500 mg total) by mouth 2 (two) times daily. Seizure prevention. 60 tablet 2  . levothyroxine (SYNTHROID) 50 MCG tablet Take 50 mcg by mouth daily.    Marland Kitchen nystatin cream (MYCOSTATIN) Apply topically 2 (two) times daily.    . Probiotic Product ([place])  Take by mouth daily.    . RABEprazole (ACIPHEX) 20 MG tablet Take 1 tablet by mouth daily. 1 hour before meals    . VITAMIN D PO Take by mouth daily.     No current facility-administered medications for this visit.    PHYSICAL EXAMINATION:   There were no vitals taken for this visit.  There were no vitals filed for this visit.   Physical Exam Constitutional:      Comments: He is walking himself.  HENT:     Head: Normocephalic and atraumatic.     Mouth/Throat:     Pharynx: No oropharyngeal exudate.  Eyes:     Pupils: Pupils are equal, round, and reactive to light.  Cardiovascular:     Rate and Rhythm: Normal rate and regular rhythm.  Pulmonary:     Effort: No respiratory distress.     Breath sounds: No wheezing.  Abdominal:     General: Bowel sounds are normal. There is no distension.     Palpations: Abdomen is soft. There is no mass.     Tenderness: There is no abdominal tenderness. There is no guarding or rebound.  Musculoskeletal:        General: No tenderness. Normal range of motion.     Cervical back: Normal range of motion and neck supple.  Skin:    General: Skin is warm.  Neurological:     Mental Status: He is alert and oriented to person, place, and time.  Psychiatric:        Mood and Affect: Affect normal.    LABORATORY DATA:  I have reviewed the data as listed    Component Value Date/Time   NA 135 10/03/2021 1407   NA 137 02/24/2015 1131   K 4.3 10/03/2021 1407   K 4.1 02/24/2015 1131   CL 101 10/03/2021 1407   CL 103 02/24/2015 1131   CO2 28 10/03/2021 1407   CO2 29 02/24/2015 1131   GLUCOSE 101 (H) 10/03/2021 1407   GLUCOSE 135 (H) 02/24/2015 1131   BUN 19 10/03/2021 1407   BUN 14 02/24/2015 1131   CREATININE 1.23 10/03/2021 1407   CREATININE 1.32 (H) 02/24/2015 1131   CALCIUM 8.0 (L) 10/03/2021 1407   CALCIUM 8.8 (L) 02/24/2015 1131   PROT 6.4 (L) 06/18/2020 0409   PROT 6.5 02/24/2015 1131   ALBUMIN 3.7 06/18/2020 0409   ALBUMIN 3.9  02/24/2015 1131   AST 20 06/18/2020 0409   AST 19 02/24/2015 1131   ALT 11 06/18/2020 0409   ALT 11 (L) 02/24/2015 1131   ALKPHOS 59 06/18/2020 0409   ALKPHOS 43 02/24/2015 1131   BILITOT 0.8 06/18/2020 0409   BILITOT 0.9 02/24/2015 1131   GFRNONAA 57 (L) 10/03/2021 1407   GFRNONAA 50 (L) 02/24/2015 1131   GFRAA >60 06/20/2020 0506   GFRAA 58 (L) 02/24/2015 1131    No results found for: SPEP, UPEP  Lab Results  Component Value Date   WBC 8.1 10/03/2021  NEUTROABS 6.2 10/03/2021   HGB 12.0 (L) 10/03/2021   HCT 36.3 (L) 10/03/2021   MCV 89.9 10/03/2021   PLT 162 10/03/2021      Chemistry      Component Value Date/Time   NA 135 10/03/2021 1407   NA 137 02/24/2015 1131   K 4.3 10/03/2021 1407   K 4.1 02/24/2015 1131   CL 101 10/03/2021 1407   CL 103 02/24/2015 1131   CO2 28 10/03/2021 1407   CO2 29 02/24/2015 1131   BUN 19 10/03/2021 1407   BUN 14 02/24/2015 1131   CREATININE 1.23 10/03/2021 1407   CREATININE 1.32 (H) 02/24/2015 1131      Component Value Date/Time   CALCIUM 8.0 (L) 10/03/2021 1407   CALCIUM 8.8 (L) 02/24/2015 1131   ALKPHOS 59 06/18/2020 0409   ALKPHOS 43 02/24/2015 1131   AST 20 06/18/2020 0409   AST 19 02/24/2015 1131   ALT 11 06/18/2020 0409   ALT 11 (L) 02/24/2015 1131   BILITOT 0.8 06/18/2020 0409   BILITOT 0.9 02/24/2015 1131        ASSESSMENT & PLAN:   No problem-specific Assessment & Plan notes found for this encounter.    Cammie Sickle, MD 04/17/2022 1:11 PM

## 2022-04-17 NOTE — Assessment & Plan Note (Deleted)
#   ITP-Steroid responsive.  Currently on surveillance.overall STABLE; Platelets of 142 Continue surveillance.   # IDA-  Mild anemia/likely from CKD- Hb 10.7; non-compliant with PO iron. Recommend PO iron.  STABLE. Re-check Iron levels.  Recommend p.o. iron.  # CKD-stage III [GFR-54]  STABLE; recommend hydration.   #Seizures -STABLE; on keppra.   # Dizziness [recent ear surgery; s/p tube]-recommend continued hydration.  If not improved recommend evaluation with ENT.  #DISPOSITION:  # follow up in 6 months- MD;  [cbcbmp/ Iron studies/ ferritin]- Dr.B   Dr.Bliss

## 2022-04-18 ENCOUNTER — Encounter: Payer: Self-pay | Admitting: Internal Medicine

## 2022-05-05 ENCOUNTER — Encounter: Payer: Self-pay | Admitting: Internal Medicine

## 2022-05-05 ENCOUNTER — Inpatient Hospital Stay: Payer: PPO | Admitting: Internal Medicine

## 2022-05-05 ENCOUNTER — Inpatient Hospital Stay: Payer: PPO

## 2022-05-05 DIAGNOSIS — D693 Immune thrombocytopenic purpura: Secondary | ICD-10-CM

## 2022-05-05 DIAGNOSIS — Z79899 Other long term (current) drug therapy: Secondary | ICD-10-CM | POA: Diagnosis not present

## 2022-05-05 DIAGNOSIS — Z8249 Family history of ischemic heart disease and other diseases of the circulatory system: Secondary | ICD-10-CM | POA: Diagnosis not present

## 2022-05-05 DIAGNOSIS — Z885 Allergy status to narcotic agent status: Secondary | ICD-10-CM | POA: Diagnosis not present

## 2022-05-05 DIAGNOSIS — Z79890 Hormone replacement therapy (postmenopausal): Secondary | ICD-10-CM | POA: Diagnosis not present

## 2022-05-05 DIAGNOSIS — E611 Iron deficiency: Secondary | ICD-10-CM | POA: Diagnosis not present

## 2022-05-05 DIAGNOSIS — N1831 Chronic kidney disease, stage 3a: Secondary | ICD-10-CM | POA: Diagnosis not present

## 2022-05-05 DIAGNOSIS — Z8673 Personal history of transient ischemic attack (TIA), and cerebral infarction without residual deficits: Secondary | ICD-10-CM | POA: Diagnosis not present

## 2022-05-05 DIAGNOSIS — Z8379 Family history of other diseases of the digestive system: Secondary | ICD-10-CM | POA: Diagnosis not present

## 2022-05-05 DIAGNOSIS — D631 Anemia in chronic kidney disease: Secondary | ICD-10-CM | POA: Diagnosis not present

## 2022-05-05 DIAGNOSIS — Z9049 Acquired absence of other specified parts of digestive tract: Secondary | ICD-10-CM | POA: Diagnosis not present

## 2022-05-05 LAB — BASIC METABOLIC PANEL
Anion gap: 6 (ref 5–15)
BUN: 19 mg/dL (ref 8–23)
CO2: 26 mmol/L (ref 22–32)
Calcium: 8.3 mg/dL — ABNORMAL LOW (ref 8.9–10.3)
Chloride: 104 mmol/L (ref 98–111)
Creatinine, Ser: 1.13 mg/dL (ref 0.61–1.24)
GFR, Estimated: >60 mL/min (ref >60)
Glucose, Bld: 99 mg/dL (ref 70–99)
Potassium: 4.4 mmol/L (ref 3.5–5.1)
Sodium: 136 mmol/L (ref 135–145)

## 2022-05-05 LAB — CBC WITH DIFFERENTIAL/PLATELET
Abs Immature Granulocytes: 0.06 10*3/uL (ref 0.00–0.07)
Basophils Absolute: 0 10*3/uL (ref 0.0–0.1)
Basophils Relative: 0 %
Eosinophils Absolute: 0.1 10*3/uL (ref 0.0–0.5)
Eosinophils Relative: 2 %
HCT: 33.6 % — ABNORMAL LOW (ref 39.0–52.0)
Hemoglobin: 11.5 g/dL — ABNORMAL LOW (ref 13.0–17.0)
Immature Granulocytes: 1 %
Lymphocytes Relative: 16 %
Lymphs Abs: 1.3 10*3/uL (ref 0.7–4.0)
MCH: 31.7 pg (ref 26.0–34.0)
MCHC: 34.2 g/dL (ref 30.0–36.0)
MCV: 92.6 fL (ref 80.0–100.0)
Monocytes Absolute: 0.7 10*3/uL (ref 0.1–1.0)
Monocytes Relative: 9 %
Neutro Abs: 5.7 10*3/uL (ref 1.7–7.7)
Neutrophils Relative %: 72 %
Platelets: 161 10*3/uL (ref 150–400)
RBC: 3.63 MIL/uL — ABNORMAL LOW (ref 4.22–5.81)
RDW: 14.8 % (ref 11.5–15.5)
WBC: 7.9 10*3/uL (ref 4.0–10.5)
nRBC: 0 % (ref 0.0–0.2)

## 2022-05-05 LAB — IRON AND TIBC
Iron: 66 ug/dL (ref 45–182)
Saturation Ratios: 26 % (ref 17.9–39.5)
TIBC: 251 ug/dL (ref 250–450)
UIBC: 185 ug/dL

## 2022-05-05 LAB — FERRITIN: Ferritin: 39 ng/mL (ref 24–336)

## 2022-08-23 ENCOUNTER — Emergency Department: Payer: PPO

## 2022-08-23 ENCOUNTER — Encounter: Payer: Self-pay | Admitting: Intensive Care

## 2022-08-23 ENCOUNTER — Other Ambulatory Visit: Payer: Self-pay

## 2022-08-23 ENCOUNTER — Inpatient Hospital Stay
Admission: EM | Admit: 2022-08-23 | Discharge: 2022-08-26 | DRG: 643 | Disposition: A | Discharge status: Home Health | Payer: PPO | Attending: Internal Medicine | Admitting: Internal Medicine

## 2022-08-23 DIAGNOSIS — D519 Vitamin B12 deficiency anemia, unspecified: Secondary | ICD-10-CM | POA: Diagnosis present

## 2022-08-23 DIAGNOSIS — Z8719 Personal history of other diseases of the digestive system: Secondary | ICD-10-CM

## 2022-08-23 DIAGNOSIS — K222 Esophageal obstruction: Secondary | ICD-10-CM | POA: Diagnosis present

## 2022-08-23 DIAGNOSIS — D649 Anemia, unspecified: Secondary | ICD-10-CM | POA: Diagnosis not present

## 2022-08-23 DIAGNOSIS — Z8673 Personal history of transient ischemic attack (TIA), and cerebral infarction without residual deficits: Secondary | ICD-10-CM

## 2022-08-23 DIAGNOSIS — K922 Gastrointestinal hemorrhage, unspecified: Secondary | ICD-10-CM | POA: Diagnosis not present

## 2022-08-23 DIAGNOSIS — R112 Nausea with vomiting, unspecified: Principal | ICD-10-CM

## 2022-08-23 DIAGNOSIS — E039 Hypothyroidism, unspecified: Principal | ICD-10-CM | POA: Diagnosis present

## 2022-08-23 DIAGNOSIS — Z20822 Contact with and (suspected) exposure to covid-19: Secondary | ICD-10-CM | POA: Diagnosis present

## 2022-08-23 DIAGNOSIS — G8929 Other chronic pain: Secondary | ICD-10-CM | POA: Diagnosis present

## 2022-08-23 DIAGNOSIS — D509 Iron deficiency anemia, unspecified: Secondary | ICD-10-CM | POA: Diagnosis present

## 2022-08-23 DIAGNOSIS — J690 Pneumonitis due to inhalation of food and vomit: Secondary | ICD-10-CM

## 2022-08-23 DIAGNOSIS — D693 Immune thrombocytopenic purpura: Secondary | ICD-10-CM | POA: Diagnosis not present

## 2022-08-23 DIAGNOSIS — I5030 Unspecified diastolic (congestive) heart failure: Secondary | ICD-10-CM

## 2022-08-23 DIAGNOSIS — R5381 Other malaise: Secondary | ICD-10-CM | POA: Diagnosis present

## 2022-08-23 DIAGNOSIS — K921 Melena: Secondary | ICD-10-CM | POA: Diagnosis present

## 2022-08-23 DIAGNOSIS — Z85828 Personal history of other malignant neoplasm of skin: Secondary | ICD-10-CM

## 2022-08-23 DIAGNOSIS — Z885 Allergy status to narcotic agent status: Secondary | ICD-10-CM

## 2022-08-23 DIAGNOSIS — M170 Bilateral primary osteoarthritis of knee: Secondary | ICD-10-CM | POA: Diagnosis present

## 2022-08-23 DIAGNOSIS — I129 Hypertensive chronic kidney disease with stage 1 through stage 4 chronic kidney disease, or unspecified chronic kidney disease: Secondary | ICD-10-CM | POA: Diagnosis present

## 2022-08-23 DIAGNOSIS — I639 Cerebral infarction, unspecified: Secondary | ICD-10-CM | POA: Diagnosis present

## 2022-08-23 DIAGNOSIS — N189 Chronic kidney disease, unspecified: Secondary | ICD-10-CM | POA: Diagnosis present

## 2022-08-23 DIAGNOSIS — R195 Other fecal abnormalities: Secondary | ICD-10-CM

## 2022-08-23 DIAGNOSIS — Z8249 Family history of ischemic heart disease and other diseases of the circulatory system: Secondary | ICD-10-CM

## 2022-08-23 DIAGNOSIS — K529 Noninfective gastroenteritis and colitis, unspecified: Secondary | ICD-10-CM | POA: Diagnosis not present

## 2022-08-23 DIAGNOSIS — M542 Cervicalgia: Secondary | ICD-10-CM | POA: Diagnosis present

## 2022-08-23 DIAGNOSIS — E876 Hypokalemia: Secondary | ICD-10-CM | POA: Diagnosis not present

## 2022-08-23 DIAGNOSIS — G40909 Epilepsy, unspecified, not intractable, without status epilepticus: Secondary | ICD-10-CM | POA: Diagnosis present

## 2022-08-23 DIAGNOSIS — Z8379 Family history of other diseases of the digestive system: Secondary | ICD-10-CM

## 2022-08-23 DIAGNOSIS — R569 Unspecified convulsions: Secondary | ICD-10-CM

## 2022-08-23 DIAGNOSIS — Z7982 Long term (current) use of aspirin: Secondary | ICD-10-CM

## 2022-08-23 DIAGNOSIS — K920 Hematemesis: Secondary | ICD-10-CM

## 2022-08-23 DIAGNOSIS — Z79890 Hormone replacement therapy (postmenopausal): Secondary | ICD-10-CM

## 2022-08-23 DIAGNOSIS — R809 Proteinuria, unspecified: Secondary | ICD-10-CM | POA: Diagnosis present

## 2022-08-23 DIAGNOSIS — K2980 Duodenitis without bleeding: Secondary | ICD-10-CM | POA: Diagnosis present

## 2022-08-23 DIAGNOSIS — Z9049 Acquired absence of other specified parts of digestive tract: Secondary | ICD-10-CM

## 2022-08-23 DIAGNOSIS — Z79899 Other long term (current) drug therapy: Secondary | ICD-10-CM

## 2022-08-23 DIAGNOSIS — D62 Acute posthemorrhagic anemia: Secondary | ICD-10-CM | POA: Diagnosis present

## 2022-08-23 DIAGNOSIS — J309 Allergic rhinitis, unspecified: Secondary | ICD-10-CM | POA: Diagnosis present

## 2022-08-23 DIAGNOSIS — R944 Abnormal results of kidney function studies: Secondary | ICD-10-CM | POA: Diagnosis present

## 2022-08-23 DIAGNOSIS — Z862 Personal history of diseases of the blood and blood-forming organs and certain disorders involving the immune mechanism: Secondary | ICD-10-CM

## 2022-08-23 DIAGNOSIS — K219 Gastro-esophageal reflux disease without esophagitis: Secondary | ICD-10-CM

## 2022-08-23 DIAGNOSIS — K449 Diaphragmatic hernia without obstruction or gangrene: Secondary | ICD-10-CM | POA: Diagnosis present

## 2022-08-23 DIAGNOSIS — M25462 Effusion, left knee: Secondary | ICD-10-CM | POA: Diagnosis present

## 2022-08-23 DIAGNOSIS — K2970 Gastritis, unspecified, without bleeding: Secondary | ICD-10-CM | POA: Diagnosis present

## 2022-08-23 HISTORY — DX: Unspecified malignant neoplasm of skin of nose: C44.301

## 2022-08-23 HISTORY — DX: Unspecified convulsions: R56.9

## 2022-08-23 LAB — CBC
HCT: 34.5 % — ABNORMAL LOW (ref 39.0–52.0)
HCT: 31.1 % — ABNORMAL LOW (ref 39.0–52.0)
Hemoglobin: 10.9 g/dL — ABNORMAL LOW (ref 13.0–17.0)
Hemoglobin: 9.9 g/dL — ABNORMAL LOW (ref 13.0–17.0)
MCH: 29.8 pg (ref 26.0–34.0)
MCH: 29.9 pg (ref 26.0–34.0)
MCHC: 31.6 g/dL (ref 30.0–36.0)
MCHC: 31.8 g/dL (ref 30.0–36.0)
MCV: 94.3 fL (ref 80.0–100.0)
MCV: 94 fL (ref 80.0–100.0)
Platelets: 155 10*3/uL (ref 150–400)
Platelets: 140 10*3/uL — ABNORMAL LOW (ref 150–400)
RBC: 3.66 MIL/uL — ABNORMAL LOW (ref 4.22–5.81)
RBC: 3.31 MIL/uL — ABNORMAL LOW (ref 4.22–5.81)
RDW: 13.8 % (ref 11.5–15.5)
RDW: 13.9 % (ref 11.5–15.5)
WBC: 12.9 10*3/uL — ABNORMAL HIGH (ref 4.0–10.5)
WBC: 10.2 10*3/uL (ref 4.0–10.5)
nRBC: 0 % (ref 0.0–0.2)
nRBC: 0 % (ref 0.0–0.2)

## 2022-08-23 LAB — COMPREHENSIVE METABOLIC PANEL
ALT: 15 U/L (ref 0–44)
AST: 27 U/L (ref 15–41)
Albumin: 3.4 g/dL — ABNORMAL LOW (ref 3.5–5.0)
Alkaline Phosphatase: 57 U/L (ref 38–126)
Anion gap: 8 (ref 5–15)
BUN: 29 mg/dL — ABNORMAL HIGH (ref 8–23)
CO2: 30 mmol/L (ref 22–32)
Calcium: 8.6 mg/dL — ABNORMAL LOW (ref 8.9–10.3)
Chloride: 100 mmol/L (ref 98–111)
Creatinine, Ser: 1.1 mg/dL (ref 0.61–1.24)
GFR, Estimated: >60 mL/min (ref >60)
Glucose, Bld: 146 mg/dL — ABNORMAL HIGH (ref 70–99)
Potassium: 4 mmol/L (ref 3.5–5.1)
Sodium: 138 mmol/L (ref 135–145)
Total Bilirubin: 1.1 mg/dL (ref 0.3–1.2)
Total Protein: 6.1 g/dL — ABNORMAL LOW (ref 6.5–8.1)

## 2022-08-23 LAB — GASTROINTESTINAL PANEL BY PCR, STOOL (REPLACES STOOL CULTURE)

## 2022-08-23 LAB — URINALYSIS, ROUTINE W REFLEX MICROSCOPIC
Bacteria, UA: NONE SEEN
Bilirubin Urine: NEGATIVE
Glucose, UA: NEGATIVE mg/dL
Hgb urine dipstick: NEGATIVE
Ketones, ur: NEGATIVE mg/dL
Leukocytes,Ua: NEGATIVE
Nitrite: NEGATIVE
Protein, ur: 30 mg/dL — AB
Specific Gravity, Urine: 1.026 (ref 1.005–1.030)
pH: 5 (ref 5.0–8.0)

## 2022-08-23 LAB — FERRITIN: Ferritin: 54 ng/mL (ref 24–336)

## 2022-08-23 LAB — RESP PANEL BY RT-PCR (FLU A&B, COVID) ARPGX2
Influenza A by PCR: NEGATIVE
Influenza B by PCR: NEGATIVE
SARS Coronavirus 2 by RT PCR: NEGATIVE

## 2022-08-23 LAB — TROPONIN I (HIGH SENSITIVITY): Troponin I (High Sensitivity): 16 ng/L (ref <18)

## 2022-08-23 LAB — C DIFFICILE QUICK SCREEN W PCR REFLEX
C Diff antigen: NEGATIVE
C Diff interpretation: NOT DETECTED
C Diff toxin: NEGATIVE

## 2022-08-23 LAB — IRON AND TIBC
Iron: 27 ug/dL — ABNORMAL LOW (ref 45–182)
Saturation Ratios: 15 % — ABNORMAL LOW (ref 17.9–39.5)
TIBC: 183 ug/dL — ABNORMAL LOW (ref 250–450)
UIBC: 156 ug/dL

## 2022-08-23 LAB — LIPASE, BLOOD: Lipase: 39 U/L (ref 11–51)

## 2022-08-23 LAB — BRAIN NATRIURETIC PEPTIDE: B Natriuretic Peptide: 263.5 pg/mL — ABNORMAL HIGH (ref 0.0–100.0)

## 2022-08-23 MED ORDER — SODIUM CHLORIDE 0.9% FLUSH
3.0000 mL | Freq: Two times a day (BID) | INTRAVENOUS | Status: DC
  Administered 2022-08-23 – 2022-08-26 (×6): 3 mL via INTRAVENOUS

## 2022-08-23 MED ORDER — METRONIDAZOLE 500 MG/100ML IV SOLN
500.0000 mg | Freq: Once | INTRAVENOUS | Status: DC
  Filled 2022-08-23: qty 100

## 2022-08-23 MED ORDER — LEVETIRACETAM IN NACL 500 MG/100ML IV SOLN
500.0000 mg | Freq: Once | INTRAVENOUS | Status: AC
  Administered 2022-08-23: 500 mg via INTRAVENOUS
  Filled 2022-08-23: qty 100

## 2022-08-23 MED ORDER — ASPIRIN 81 MG PO TBEC: 81.0000 mg | DELAYED_RELEASE_TABLET | Freq: Every day | ORAL | Status: DC

## 2022-08-23 MED ORDER — IOHEXOL 300 MG/ML  SOLN
100.0000 mL | Freq: Once | INTRAMUSCULAR | Status: AC | PRN
  Administered 2022-08-23: 100 mL via INTRAVENOUS

## 2022-08-23 MED ORDER — ACETAMINOPHEN 650 MG RE SUPP: 650.0000 mg | Freq: Four times a day (QID) | RECTAL | Status: DC | PRN

## 2022-08-23 MED ORDER — CIPROFLOXACIN IN D5W 400 MG/200ML IV SOLN
400.0000 mg | Freq: Once | INTRAVENOUS | Status: DC
  Filled 2022-08-23: qty 200

## 2022-08-23 MED ORDER — ONDANSETRON HCL 4 MG PO TABS: 4.0000 mg | ORAL_TABLET | Freq: Four times a day (QID) | ORAL | Status: DC | PRN

## 2022-08-23 MED ORDER — ONDANSETRON HCL 4 MG/2ML IJ SOLN: 4.0000 mg | Freq: Four times a day (QID) | INTRAMUSCULAR | Status: DC | PRN

## 2022-08-23 MED ORDER — ONDANSETRON HCL 4 MG/2ML IJ SOLN
4.0000 mg | Freq: Once | INTRAMUSCULAR | Status: AC
  Administered 2022-08-23: 4 mg via INTRAVENOUS
  Filled 2022-08-23: qty 2

## 2022-08-23 MED ORDER — LEVETIRACETAM 500 MG PO TABS
500.0000 mg | ORAL_TABLET | Freq: Every day | ORAL | Status: DC
  Administered 2022-08-23 – 2022-08-25 (×3): 500 mg via ORAL
  Filled 2022-08-23 (×3): qty 1

## 2022-08-23 MED ORDER — LEVOTHYROXINE SODIUM 50 MCG PO TABS
50.0000 ug | ORAL_TABLET | Freq: Every day | ORAL | Status: DC
  Administered 2022-08-24 – 2022-08-26 (×3): 50 ug via ORAL
  Filled 2022-08-23 (×3): qty 1

## 2022-08-23 MED ORDER — PANTOPRAZOLE SODIUM 40 MG IV SOLR
40.0000 mg | Freq: Two times a day (BID) | INTRAVENOUS | Status: DC
  Administered 2022-08-23 – 2022-08-25 (×3): 40 mg via INTRAVENOUS
  Filled 2022-08-23 (×3): qty 10

## 2022-08-23 MED ORDER — LEVETIRACETAM 500 MG PO TABS: 500.0000 mg | ORAL_TABLET | Freq: Two times a day (BID) | ORAL | Status: DC

## 2022-08-23 MED ORDER — LEVETIRACETAM 250 MG PO TABS
250.0000 mg | ORAL_TABLET | Freq: Every morning | ORAL | Status: DC
  Administered 2022-08-25 – 2022-08-26 (×2): 250 mg via ORAL
  Filled 2022-08-23 (×3): qty 1

## 2022-08-23 MED ORDER — ACETAMINOPHEN 325 MG PO TABS
650.0000 mg | ORAL_TABLET | Freq: Four times a day (QID) | ORAL | Status: DC | PRN
  Administered 2022-08-24 – 2022-08-25 (×3): 650 mg via ORAL
  Filled 2022-08-23 (×3): qty 2

## 2022-08-23 MED ORDER — SODIUM CHLORIDE 0.9 % IV SOLN: INTRAVENOUS | Status: DC

## 2022-08-23 MED ORDER — PANTOPRAZOLE SODIUM 40 MG IV SOLR
40.0000 mg | Freq: Once | INTRAVENOUS | Status: AC
  Administered 2022-08-23: 40 mg via INTRAVENOUS
  Filled 2022-08-23: qty 10

## 2022-08-23 NOTE — ED Provider Notes (Signed)
[facility] Provider Note    Event Date/Time   First MD Initiated Contact with Patient 08/23/22 1256     (approximate)   History   Emesis   HPI  [name] is a 88 y.o. male who comes in with nausea and vomiting since 5 PM yesterday.  He does report concerned that it was dark in color.  There was concern of an oxygen level of 82% on room air with EMS.  I took the patient off the oxygen and his oxygen levels 98%.  He denies any shortness of breath but does report little bit of cough with some congestion.  He does report that prior to this starting he did eat a very large salad so he is not sure if it could be related to an infection.  He reports multiple episodes of dark black vomit as well as now having diarrhea that is dark in nature.  Denies any blood thinners.  He did report some abdominal distention but reports that this come down since having the vomiting.  Denies any recent falls hitting his head.  He does report increasing swelling in his legs and has not been using his compression stockings.  Patient has a history of idiopathic thrombocytopenic purpura, seizures, on aspirin     Physical Exam   Triage Vital Signs: ED Triage Vitals  Enc Vitals Group     BP 08/23/22 1003 103/78     Pulse Rate 08/23/22 1003 88     Resp 08/23/22 1003 16     Temp 08/23/22 1003 98.8 F (37.1 C)     Temp src --      SpO2 08/23/22 1003 98 %     Weight 08/23/22 1005 172 lb (78 kg)     Height 08/23/22 1005 '5\' 9"'$  (1.753 m)     Head Circumference --      Peak Flow --      Pain Score 08/23/22 1005 0     Pain Loc --      Pain Edu? --      Excl. in [place]? --     Most recent vital signs: Vitals:   08/23/22 1003  BP: 103/78  Pulse: 88  Resp: 16  Temp: 98.8 F (37.1 C)  SpO2: 98%     General: Awake, no distress.  CV:  Good peripheral perfusion.  Resp:  Normal effort.  Abd:  Slight distention but soft Other:  Patient has pressure wound noted to his bottom  sacrum with some redness noted.  His stool does appear like a dark brown but is significantly positive Hemoccult Patient does have 2+ swelling in bilateral legs.  ED Results / Procedures / Treatments   Labs (all labs ordered are listed, but only abnormal results are displayed) Labs Reviewed  COMPREHENSIVE METABOLIC PANEL - Abnormal; Notable for the following components:      Result Value   Glucose, Bld 146 (*)    BUN 29 (*)    Calcium 8.6 (*)    Total Protein 6.1 (*)    Albumin 3.4 (*)    All other components within normal limits  CBC - Abnormal; Notable for the following components:   WBC 12.9 (*)    RBC 3.66 (*)    Hemoglobin 10.9 (*)    HCT 34.5 (*)    All other components within normal limits  URINALYSIS, ROUTINE W REFLEX MICROSCOPIC - Abnormal; Notable for the following components:   Color, Urine YELLOW (*)  APPearance CLEAR (*)    Protein, ur 30 (*)    All other components within normal limits  LIPASE, BLOOD     EKG  My interpretation of EKG:  Normal sinus rate of 76 without any ST elevation but does have T wave inversion in lead III and aVF, normal intervals  RADIOLOGY I have reviewed the xray personally and interpreted no evidence of any pleural effusions or pneumonia   PROCEDURES:  Critical Care performed: No  Procedures   MEDICATIONS ORDERED IN ED: Medications  ciprofloxacin (CIPRO) IVPB 400 mg (has no administration in time range)  metroNIDAZOLE (FLAGYL) IVPB 500 mg (has no administration in time range)  ondansetron (ZOFRAN) injection 4 mg (4 mg Intravenous Given 08/23/22 1341)  pantoprazole (PROTONIX) injection 40 mg (40 mg Intravenous Given 08/23/22 1340)  levETIRAcetam (KEPPRA) IVPB 500 mg/100 mL premix (0 mg Intravenous Stopped 08/23/22 1427)  iohexol (OMNIPAQUE) 300 MG/ML solution 100 mL (100 mLs Intravenous Contrast Given 08/23/22 1348)     IMPRESSION / MDM / ASSESSMENT AND PLAN / ED COURSE  I reviewed the triage vital signs and the  nursing notes.   Patient's presentation is most consistent with acute presentation with potential threat to life or bodily function.   Patient comes in with concerns for vomiting block and now having black stool.  Patient stool looked more dark brown to me but was positive for Hemoccult.  Given the multiple episodes of diarrhea and patient's age I will get stool studies for him.  We will start him on some Protonix and get CT scan to evaluate for any evidence of acute infection or bowel obstruction given prior history of this.  There are some concern for hypoxia but I took him off the oxygen and his oxygen level seem to be doing well.  Urine with protein lipase normal.  CMP creatinine stable BUN slightly elevated.  CBC shows hemoglobin of 10.9  CT scan concerning for possible colitis.  Will start on Cipro Flagyl while waiting stool studies.  His troponin was negative.  BNP was slightly elevated.  His CT scan also shows concern for aortic calcification and can get echocardiogram and I did discuss this with the hospital team as well.  Given patient's age and concern for black vomiting and significantly positive Hemoccult will discuss hospital team for admission for hemoglobin monitoring overnight   FINAL CLINICAL IMPRESSION(S) / ED DIAGNOSES   Final diagnoses:  Nausea and vomiting, unspecified vomiting type  Colitis  Occult blood positive stool     Rx / DC Orders   ED Discharge Orders     None        Note:  This document was prepared using Dragon voice recognition software and may include unintentional dictation errors.   Vanessa [place], MD 08/23/22 [phone]

## 2022-08-23 NOTE — ED Notes (Signed)
PT states coming in for diarrhea and vomiting. Pt states stool was black in color.

## 2022-08-23 NOTE — Assessment & Plan Note (Addendum)
With ferritin being 54, this goes along with iron deficiency anemia

## 2022-08-23 NOTE — Assessment & Plan Note (Addendum)
Stool studies negative.  This has improved.  Tolerating diet.

## 2022-08-23 NOTE — Assessment & Plan Note (Addendum)
Previous history of ITP.  Last platelet count 135.

## 2022-08-23 NOTE — H&P (Addendum)
History and Physical    Patient: RANDOM DOBROWSKI YSA:[id number] DOB: 05-17-1932 DOA: 08/23/2022 DOS: the patient was seen and examined on 08/23/2022 PCP: Lynnell Jude, MD  Patient coming from: Home  Chief Complaint:  Chief Complaint  Patient presents with   Emesis   HPI: NAVEEN CLARDY is a 88 y.o. male with medical history significant of gastritis/duodenitis, CVA, seizure disorder, HTN, ITP, SBP, who presents to the ED with c/o black emesis.   Mr. Wh[place]burg states he was in his usual state of health until the afternoon of Monday, October 9th when he ate a large salad from [place] with his wife. Shortly afterwards, he began to experience general malaise with fatigue. The next day, he became nausea with one large non-bloody non-melanotic emesis. He developed abdominal bloating but denies any abdominal pain. Around midnight of 10/10, he began to experience multiple (5-6 episode) of black emesis that had a foul smell. He noticed after this, his abdominal bloating felt improved. This AM, on arrival to the ED, he had three dark loose stools.   After multiple episodes of emesis last night, Mr. Bass states he felt slightly SOB with productive cough. Sputum was initially dark but has since cleared. He denies any SOB currently.   Otherwise, he denies any fever, chills, abdominal pain, chest pain, urinary changes, focal weakness.   ED Course:  On arrival to the ED, patient was normotensive at 103/78 with heart rate of 88.  He was afebrile at 98.8.  He was saturating at 98% on room air. Initial results remarkable for elevated white blood count at 12.9.  CT abdomen/pelvis obtained that demonstrated multiple diffusely fluid-filled distended loops of small bowel measuring up to 4 cm.  Loops are thickened and hyperenhancing, consistent with enterocolitis and diarrheal illness.  No other acute findings noted.  Given patient's history of hematemesis with now melena, [facility] contacted for admission.  Review of  Systems: As mentioned in the history of present illness. All other systems reviewed and are negative.  Past Medical History:  Diagnosis Date   Allergic rhinitis    Arthropathy    B12 deficiency anemia    Chronic neck pain    CVA (cerebral infarction)    GERD (gastroesophageal reflux disease)    History of seizures    HTN (hypertension)    IDA (iron deficiency anemia)    ITP (idiopathic thrombocytopenic purpura) 07/16/2014   Seizures (HCC)    Skin cancer of nose    Wears dentures    partial upper and lower   Past Surgical History:  Procedure Laterality Date   CHOLECYSTECTOMY  10/13/1988   COLONOSCOPY  10/2014, 08/2014   Dr. Gwenlyn Perking   MYRINGOTOMY WITH TUBE PLACEMENT Bilateral 01/27/2021   Procedure: MYRINGOTOMY WITH TUBE PLACEMENT;  Surgeon: Margaretha Sheffield, MD;  Location: [facility];  Service: ENT;  Laterality: Bilateral;   UPPER GI ENDOSCOPY     Social History:  reports that he has never smoked. He has never used smokeless tobacco. He reports that he does not drink alcohol and does not use drugs.  Allergies  Allergen Reactions   Meperidine Hives    Family History  Problem Relation Age of Onset   Heart disease Other    Cirrhosis Mother     Prior to Admission medications   Medication Sig Start Date End Date Taking? Authorizing Provider  amoxicillin-clavulanate (AUGMENTIN) 875-125 MG tablet SMARTSIG:1 Tablet(s) By Mouth Every 12 Hours 04/27/22   [provider]  aspirin EC 81 MG tablet  Take 81 mg by mouth 2 (two) times daily.     [provider]  Cyanocobalamin (VITAMIN B12 PO) Take by mouth daily.    [provider]  Docusate Sodium (STOOL SOFTENER LAXATIVE PO) Take by mouth.    [provider]  ferrous sulfate 325 (65 FE) MG tablet Take by mouth.    [provider]  fluconazole (DIFLUCAN) 150 MG tablet Take 150 mg by mouth once a week. 04/27/22   [provider]  Ibuprofen (ADVIL LIQUI-GELS MINIS) 200 MG CAPS  Take 2 capsules (400 mg total) by mouth at bedtime as needed. 06/20/20   Enzo Bi, MD  levETIRAcetam (KEPPRA) 500 MG tablet Take 1 tablet (500 mg total) by mouth 2 (two) times daily. Seizure prevention. 06/20/20 10/03/21  Enzo Bi, MD  levothyroxine (SYNTHROID) 50 MCG tablet Take 50 mcg by mouth daily. 05/21/20   [provider]  nystatin cream (MYCOSTATIN) Apply topically 2 (two) times daily. 05/13/20   [provider]  Probiotic Product ([place]) Take by mouth daily.    [provider]  RABEprazole (ACIPHEX) 20 MG tablet Take 1 tablet by mouth daily. 1 hour before meals 06/02/15   [provider]  valACYclovir (VALTREX) 1000 MG tablet Take 1,000 mg by mouth 3 (three) times daily. 04/27/22   [provider]  VITAMIN D PO Take by mouth daily.    [provider]   Physical Exam: Vitals:   08/23/22 1003 08/23/22 1005 08/23/22 1410 08/23/22 1700  BP: 103/78  105/76 110/80  Pulse: 88  84 88  Resp: '16  18 20  '$ Temp: 98.8 F (37.1 C)  97.8 F (36.6 C) 98.2 F (36.8 C)  TempSrc:   Oral Oral  SpO2: 98%  100% 100%  Weight:  78 kg    Height:  '5\' 9"'$  (1.753 m)     Physical Exam Vitals and nursing note reviewed.  Constitutional:      General: He is not in acute distress.    Appearance: He is normal weight.  HENT:     Head: Normocephalic and atraumatic.     Mouth/Throat:     Mouth: Mucous membranes are moist.     Pharynx: Oropharynx is clear.  Eyes:     Conjunctiva/sclera: Conjunctivae normal.     Pupils: Pupils are equal, round, and reactive to light.  Cardiovascular:     Rate and Rhythm: Normal rate and regular rhythm.     Heart sounds: Murmur (3 out of 6, systolic decrescendo murmur heard best at right upper sternal border.) heard.  Pulmonary:     Effort: Pulmonary effort is normal. No respiratory distress.     Breath sounds: Normal breath sounds. No wheezing, rhonchi or rales.  Abdominal:     General: Bowel sounds are  normal. There is no distension.     Palpations: Abdomen is soft.     Tenderness: There is no abdominal tenderness. There is no guarding.  Musculoskeletal:     Right lower leg: 1+ Pitting Edema present.     Left lower leg: 1+ Pitting Edema present.  Skin:    General: Skin is warm and dry.     Findings: Erythema (Erythema noted on bilateral hands and left side of face from recent sunburn) present.  Neurological:     General: No focal deficit present.     Mental Status: He is alert and oriented to person, place, and time. Mental status is at baseline.  Psychiatric:  Mood and Affect: Mood normal.        Behavior: Behavior normal.        Thought Content: Thought content normal.        Judgment: Judgment normal.    Data Reviewed: CBC notable for white blood count of 12.9, hemoglobin of 10.9.  Hemoglobin approximately 3 months ago was 11.5.  CMP markable for glucose of 146, BUN of 29, calcium of 8.6, total protein of 6.1, albumin 3.4.  Lipase within normal limits at 39.  Urinalysis with minimal proteinuria.  BNP elevated at 263.  Initial troponin within normal limits at 16.  Respiratory panel including COVID-19 and influenza negative.  GI panel and C. difficile panel both negative.  Chest x-ray with mild left basilar opacity likely atelectasis.  CT abdomen pelvis with diffusely fluid-filled small bowel with mildly distended loops measuring up to 4 cm.  Several loops are somewhat thickened and hyperenhancing consistent with enterocolitis or diarrheal illness.  Diverticulosis without diverticulitis.  Aortic valve calcifications and coronary artery disease noted.  EKG personally reviewed.  Demonstrates sinus rhythm with rate of 76.  Borderline prolonged PR interval.  No acute ST or T wave changes concerning for ischemia.  There are no new results to review at this time.  Assessment and Plan: * Upper GI bleed Patient presenting with 5-6 episodes of black emesis concerning for upper GI  bleed.  This is in the setting of normal-appearing emesis earlier in the day, most concerning for Mallory-Weiss tear.  No additional episodes of emesis in the ED.  Differential also includes gastritis versus duodenitis given prior history.  Hemoglobin stable at this time but will monitor closely.  - Gastroenterology consulted; appreciate their recommendations - EGD scheduled for tomorrow - Clear liquid diet.  N.p.o. at midnight - Protonix 40 mg IV twice daily - Monitor CBC twice daily x2  Enterocolitis Patient describes 1 day history of abdominal bloating with multiple episodes of emesis after eating a salad.  He subsequently developed diarrhea today.  Likely food related bacterial illness; GI panel and C. difficile panel negative.  I suspect he will recover with conservative management only; discontinue antibiotic therapy.  - Conservative management with Zofran, IV fluids - Monitor closely bowel function given multiple dilated loops of bowel.  Patient has had 2 SBO's in the past.  Aspiration pneumonitis [facility]) Patient stated he had a short episodes of shortness of breath and has been since coughing with sputum production.  He is now saturating at 100% on room air.  I suspect this is aspiration pneumonitis in the setting of multiple episodes of emesis.    - No indication at this time for antibiotics, but low threshold to begin if patient develops worsening cough, fever, shortness of breath or hypoxia.  Normocytic anemia Patient has a long-term history of normocytic anemia dating back at least 2 years.  Hemoglobin today is lower than usual at 10.9 previously 11.5 and before that 12.0.  Patient has a history of iron deficiency anemia but this appears resolved several months ago.  Will recheck today.  - Monitor CBC twice daily while admitted given GI bleed - Iron panel, ferritin  Idiopathic thrombocytopenic purpura (HCC) Previous history of ITP, platelets within normal limits today.  Seizure  ([place]) - Continue home Keppra  Stroke ([place]) - Continue home aspirin given it is for secondary prevention  (HFpEF) heart failure with preserved ejection fraction ([place]) Patient with a history of grade 1 diastolic dysfunction with normal EF with noted lower extremity edema  and BNP elevated at 263.  No prior BNP in epic.  He is not experiencing chest pain, orthopnea, shortness of breath.  Given ongoing vomiting and diarrhea, will hold off on diuretic therapy.  - TED hose ordered - Consider diuretics on discharge or follow-up with PCP   Advance Care Planning:   Code Status: Partial Code   Consults: Gastroenterology  Family Communication: Patient's wife updated at bedside  Severity of Illness: The appropriate patient status for this patient is OBSERVATION. Observation status is judged to be reasonable and necessary in order to provide the required intensity of service to ensure the patient's safety. The patient's presenting symptoms, physical exam findings, and initial radiographic and laboratory data in the context of their medical condition is felt to place them at decreased risk for further clinical deterioration. Furthermore, it is anticipated that the patient will be medically stable for discharge from the hospital within 2 midnights of admission.   Author: Jose Persia, MD 08/23/2022 6:38 PM  For on call review [email or web address].

## 2022-08-23 NOTE — Assessment & Plan Note (Addendum)
EGD showing an erythematous stomach without any signs of bleeding.  Hemoglobin remained stable on 9.7 upon discharge.  Patient was given IV Protonix here in the hospital.  For 1 month will be on Protonix at night and Aciphex during the day. Can go back to Aciphex during the day.  We held his aspirin here in the hospital.  Recommending aspirin 81 mg once a day and not twice a day.

## 2022-08-23 NOTE — ED Triage Notes (Signed)
Patient arrived by EMS from home with c/o N/V since 5pm yesterday. Reports emesis black/dark in color yesterday and today. Recently diagnosed with skin cancer on nose. Swelling present in bilateral legs.   EMS vitals 123/78 b/p 89p 80-82% O2 room air...placed on 4L 100 O2 (EMS reports baseline RA)

## 2022-08-23 NOTE — ED Notes (Signed)
Room air O2 checked on patient. Patient remains 99% on RA. Oxygen taken off patient

## 2022-08-23 NOTE — ED Provider Triage Note (Signed)
Emergency Medicine Provider Triage Evaluation Note  [name] , a 88 y.o. male  was evaluated in triage.  Pt complains of vomiting since yesterday with "black vomiting" this morning.  Review of Systems  Positive: No black tarry stools, no diarrhea Negative: No abd pain  Physical Exam  BP 103/78 (BP Location: Left Arm)   Pulse 88   Temp 98.8 F (37.1 C)   Resp 16   SpO2 99%  Gen:   Awake, no distress, talkative, alert Resp:  Normal effort  Lungs clear  MSK:   Moves extremities without difficulty  Other:  No abdominal tenderness  Medical Decision Making  Medically screening exam initiated at 10:05 AM.  Appropriate orders placed.  [name] was informed that the remainder of the evaluation will be completed by another provider, this initial triage assessment does not replace that evaluation, and the importance of remaining in the ED until their evaluation is complete.     Johnn Hai, PA-C 08/23/22 1007

## 2022-08-23 NOTE — Assessment & Plan Note (Addendum)
Aspirin held while in the hospital.  He states he was taking it twice a day at home.  I advised once a day.

## 2022-08-23 NOTE — Assessment & Plan Note (Signed)
-   Continue home Keppra 

## 2022-08-23 NOTE — Consult Note (Addendum)
Cephas Darby, MD [address]  [place]  [place], [place] [address]  Main: [phone]  Fax: [phone] Pager: [phone]   Consultation  Referring Provider:     No ref. provider found Primary Care Physician:  Lynnell Jude, MD Primary Gastroenterologist:   Althia Forts      Reason for Consultation: Coffee-ground emesis  Date of Admission:  08/23/2022 Date of Consultation:  08/23/2022         HPI:   [name] is a 88 y.o. male history of ITP currently under surveillance, anemia secondary to iron deficiency as well as stage III CKD, history of seizure disorder presented with several episodes of nausea, vomiting followed by coffee-ground emesis and blackish watery bowel movements.  Patient reports that he had a large salad from [employer] on 10/9, started experiencing malaise with fatigue and yesterday having nausea, emesis, abdominal bloating followed by several episodes of coffee-ground emesis.  He also reported 3 dark loose stools this morning.  Patient was hemodynamically stable, afebrile, oxygenating well in the ER.  He underwent CT abdomen and pelvis which revealed multiple diffusely dilated fluid-filled loops of small bowel consistent with enterocolitis.  Stool studies were sent to rule out infection.  His hemoglobin was 10.9 on arrival, normal MCV hemoglobin further decreased to 9.9, platelets 140, at his baseline, elevated BUN/creatinine 29/1.1, serum ferritin 54.  Stool studies were negative for infection including C. difficile.  He is started on Protonix 40 mils times IV twice daily and GI is consulted for further evaluation.  When I interviewed the patient, he reports he was feeling better, denied any, lying comfortably in the bed  He denies any alcohol use  NSAIDs: None  Antiplts/Anticoagulants/Anti thrombotics: None  GI Procedures: EGD and colonoscopy by Dr. Gustavo Lah in 2015, reports not available  Past Medical History:  Diagnosis Date   Allergic  rhinitis    Arthropathy    B12 deficiency anemia    Chronic neck pain    CVA (cerebral infarction)    GERD (gastroesophageal reflux disease)    History of seizures    HTN (hypertension)    IDA (iron deficiency anemia)    ITP (idiopathic thrombocytopenic purpura) 07/16/2014   Seizures (HCC)    Skin cancer of nose    Wears dentures    partial upper and lower    Past Surgical History:  Procedure Laterality Date   CHOLECYSTECTOMY  10/13/1988   COLONOSCOPY  10/2014, 08/2014   Dr. Gwenlyn Perking   MYRINGOTOMY WITH TUBE PLACEMENT Bilateral 01/27/2021   Procedure: MYRINGOTOMY WITH TUBE PLACEMENT;  Surgeon: Margaretha Sheffield, MD;  Location: [facility];  Service: ENT;  Laterality: Bilateral;   UPPER GI ENDOSCOPY       Current Facility-Administered Medications:    0.9 %  sodium chloride infusion, , Intravenous, Continuous, Dawnell Bryant, Tally Due, MD   acetaminophen (TYLENOL) tablet 650 mg, 650 mg, Oral, Q6H PRN **OR** acetaminophen (TYLENOL) suppository 650 mg, 650 mg, Rectal, Q6H PRN, Jose Persia, MD   [START ON 08/24/2022] aspirin EC tablet 81 mg, 81 mg, Oral, Daily, Jose Persia, MD   [START ON 08/24/2022] levETIRAcetam (KEPPRA) tablet 250 mg, 250 mg, Oral, q morning, Jose Persia, MD   levETIRAcetam (KEPPRA) tablet 500 mg, 500 mg, Oral, QHS, Jose Persia, MD   [START ON 08/24/2022] levothyroxine (SYNTHROID) tablet 50 mcg, 50 mcg, Oral, Q0600, Jose Persia, MD   ondansetron (ZOFRAN) tablet 4 mg, 4 mg, Oral, Q6H PRN **OR** ondansetron (ZOFRAN) injection 4 mg, 4 mg,  Intravenous, Q6H PRN, Jose Persia, MD   pantoprazole (PROTONIX) injection 40 mg, 40 mg, Intravenous, Q12H, Jose Persia, MD   sodium chloride flush (NS) 0.9 % injection 3 mL, 3 mL, Intravenous, Q12H, Jose Persia, MD, 3 mL at 08/23/22 2034  Current Outpatient Medications:    acetaminophen (TYLENOL) 325 MG tablet, Take 650 mg by mouth every 6 (six) hours as needed for mild pain., Disp: , Rfl:    aspirin EC  81 MG tablet, Take 81 mg by mouth 2 (two) times daily. , Disp: , Rfl:    Cyanocobalamin (VITAMIN B12 PO), Take by mouth daily., Disp: , Rfl:    ferrous sulfate 325 (65 FE) MG tablet, Take 325 mg by mouth every other day as needed., Disp: , Rfl:    levETIRAcetam (KEPPRA) 500 MG tablet, Take 1 tablet (500 mg total) by mouth 2 (two) times daily. Seizure prevention., Disp: 60 tablet, Rfl: 2   levothyroxine (SYNTHROID) 50 MCG tablet, Take 50 mcg by mouth daily., Disp: , Rfl:    nystatin cream (MYCOSTATIN), Apply topically 2 (two) times daily., Disp: , Rfl:    Probiotic Product ([place]), Take by mouth daily., Disp: , Rfl:    RABEprazole (ACIPHEX) 20 MG tablet, Take 1 tablet by mouth daily. 1 hour before meals, Disp: , Rfl:    triamcinolone cream (KENALOG) 0.1 %, Apply topically 2 (two) times daily., Disp: , Rfl:    amoxicillin-clavulanate (AUGMENTIN) 875-125 MG tablet, SMARTSIG:1 Tablet(s) By Mouth Every 12 Hours (Patient not taking: Reported on 08/23/2022), Disp: , Rfl:    Docusate Sodium (STOOL SOFTENER LAXATIVE PO), Take by mouth., Disp: , Rfl:    doxycycline (VIBRA-TABS) 100 MG tablet, Take 100 mg by mouth 2 (two) times daily. (Patient not taking: Reported on 08/23/2022), Disp: , Rfl:    fluconazole (DIFLUCAN) 150 MG tablet, Take 150 mg by mouth once a week. (Patient not taking: Reported on 08/23/2022), Disp: , Rfl:    Ibuprofen (ADVIL LIQUI-GELS MINIS) 200 MG CAPS, Take 2 capsules (400 mg total) by mouth at bedtime as needed. (Patient not taking: Reported on 08/23/2022), Disp: 120 capsule, Rfl: 0   valACYclovir (VALTREX) 1000 MG tablet, Take 1,000 mg by mouth 3 (three) times daily. (Patient not taking: Reported on 08/23/2022), Disp: , Rfl:    VITAMIN D PO, Take by mouth daily., Disp: , Rfl:    Family History  Problem Relation Age of Onset   Heart disease Other    Cirrhosis Mother      Social History   Tobacco Use   Smoking status: Never   Smokeless tobacco: Never  Vaping  Use   Vaping Use: Never used  Substance Use Topics   Alcohol use: No    Alcohol/week: 0.0 standard drinks of alcohol   Drug use: No    Allergies as of 08/23/2022 - Review Complete 08/23/2022  Allergen Reaction Noted   Meperidine Hives 08/10/2014    Review of Systems:    All systems reviewed and negative except where noted in HPI.   Physical Exam:  Vital signs in last 24 hours: Temp:  [97.8 F (36.6 C)-98.8 F (37.1 C)] 98.1 F (36.7 C) (10/11 2042) Pulse Rate:  [76-88] 76 (10/11 2042) Resp:  [16-20] 16 (10/11 2042) BP: (103-110)/(76-80) 110/80 (10/11 1700) SpO2:  [98 %-100 %] 98 % (10/11 2042) Weight:  [78 kg] 78 kg (10/11 1005)   General:   Pleasant, cooperative in NAD Head:  Normocephalic and atraumatic. Eyes:   No icterus.  Conjunctiva pink. PERRLA. Ears:  Normal auditory acuity. Neck:  Supple; no masses or thyroidomegaly Lungs: Respirations even and unlabored. Lungs clear to auscultation bilaterally.   No wheezes, crackles, or rhonchi.  Heart:  Regular rate and rhythm;  Without murmur, clicks, rubs or gallops Abdomen:  Soft, nondistended, nontender. Normal bowel sounds. No appreciable masses or hepatomegaly.  No rebound or guarding.  Rectal:  Not performed. Msk:  Symmetrical without gross deformities.  Strength generalized weakness Extremities:  Without edema, cyanosis or clubbing. Neurologic:  Alert and oriented x3;  grossly normal neurologically. Skin:  Intact without significant lesions or rashes. Psych:  Alert and cooperative. Normal affect.  LAB RESULTS:    Latest Ref Rng & Units 08/23/2022    8:32 PM 08/23/2022   10:09 AM 05/05/2022    1:58 PM  CBC  WBC 4.0 - 10.5 K/uL 10.2  12.9  7.9   Hemoglobin 13.0 - 17.0 g/dL 9.9  10.9  11.5   Hematocrit 39.0 - 52.0 % 31.1  34.5  33.6   Platelets 150 - 400 K/uL 140  155  161     BMET    Latest Ref Rng & Units 08/23/2022   10:09 AM 05/05/2022    1:58 PM 10/03/2021    2:07 PM  BMP  Glucose 70 - 99 mg/dL 146   99  101   BUN 8 - 23 mg/dL _0 Creatinine 0.61 - 1.24 mg/dL 1.10  1.13  1.23   Sodium 135 - 145 mmol/L 138  136  135   Potassium 3.5 - 5.1 mmol/L 4.0  4.4  4.3   Chloride 98 - 111 mmol/L 100  104  101   CO2 22 - 32 mmol/L _1 Calcium 8.9 - 10.3 mg/dL 8.6  8.3  8.0     LFT    Latest Ref Rng & Units 08/23/2022   10:09 AM 06/18/2020    4:09 AM 11/30/2019    4:49 AM  Hepatic Function  Total Protein 6.5 - 8.1 g/dL 6.1  6.4  6.7   Albumin 3.5 - 5.0 g/dL 3.4  3.7  3.8   AST 15 - 41 U/L _2 ALT 0 - 44 U/L _3 Alk Phosphatase 38 - 126 U/L 57  59  45   Total Bilirubin 0.3 - 1.2 mg/dL 1.1  0.8  0.7      STUDIES: CT ABDOMEN PELVIS W CONTRAST  Result Date: 08/23/2022 CLINICAL DATA:  Abdominal pain EXAM: CT ABDOMEN AND PELVIS WITH CONTRAST TECHNIQUE: Multidetector CT imaging of the abdomen and pelvis was performed using the standard protocol following bolus administration of intravenous contrast. RADIATION DOSE REDUCTION: This exam was performed according to the departmental dose-optimization program which includes automated exposure control, adjustment of the mA and/or kV according to patient size and/or use of iterative reconstruction technique. CONTRAST:  164m OMNIPAQUE IOHEXOL 300 MG/ML  SOLN COMPARISON:  06/18/2020 FINDINGS: Lower chest: Unchanged scarring and or atelectasis of the bilateral lung bases with elevation of the hemidiaphragms. Mild cardiomegaly. Three-vessel coronary artery calcifications. Aortic valve calcifications. Hepatobiliary: No focal liver abnormality is seen. Status post cholecystectomy. No biliary dilatation. Pancreas: Unremarkable. No pancreatic ductal dilatation or surrounding inflammatory changes. Spleen: Normal in size without significant abnormality. Adrenals/Urinary Tract: Adrenal glands are unremarkable. Kidneys are normal, without renal calculi, solid lesion, or hydronephrosis. Bladder is unremarkable. Stomach/Bowel: Stomach is  within normal limits.  Appendix appears normal. The small bowel is diffusely fluid-filled, with several mildly distended loops measuring up to 4.0 cm. Several loops are somewhat thickened and hyperenhancing, particularly in the right hemiabdomen descending and sigmoid diverticulosis. Colon is fluid-filled to the rectum. Vascular/Lymphatic: Aortic atherosclerosis. No enlarged abdominal or pelvic lymph nodes. Reproductive: No mass or other significant abnormality. Other: No abdominal wall hernia or abnormality. No ascites. Musculoskeletal: No acute or significant osseous findings. IMPRESSION: 1. The small bowel is diffusely fluid-filled, with several mildly distended loops measuring up to 4.0 cm. Several loops are somewhat thickened and hyperenhancing, particularly in the right hemiabdomen. Colon is fluid-filled to the rectum. Findings are consistent with nonspecific infectious or inflammatory enterocolitis and diarrheal illness. 2. Diverticulosis without evidence of acute diverticulitis. 3. Coronary artery disease. 4. Aortic valve calcifications. Correlate for echocardiographic evidence of aortic valve dysfunction. Aortic Atherosclerosis (ICD10-I70.0). Electronically Signed   By: Delanna Ahmadi M.D.   On: 08/23/2022 14:10   DG Chest 2 View  Result Date: 08/23/2022 CLINICAL DATA:  Shortness of breath EXAM: CHEST - 2 VIEW COMPARISON:  Chest x-ray dated June 18, 2020 FINDINGS: Cardiac and mediastinal contours are unchanged. Mild left basilar opacity, likely atelectasis. No pleural effusion or pneumothorax. IMPRESSION: Mild left basilar opacity, likely atelectasis. Electronically Signed   By: Yetta Glassman M.D.   On: 08/23/2022 14:08      Impression / Plan:   [name] is a 88 y.o. male with history of ITP, hypothyroidism, seizure disorder, stage III CKD iron deficiency anemia on oral iron supplementation presented with 1 day history of nausea, nonbloody emesis followed by several episodes of  coffee-ground emesis  Coffee-ground emesis with acute blood loss anemia, melena Elevated BUN/creatinine ratio Does not require blood transfusion at this time Continue Protonix 40 mg Monitor CBC closely recommend EGD for further evaluation N.p.o. effective 5 AM tomorrow Okay with clear liquid diet today  I have discussed alternative options, risks & benefits,  which include, but are not limited to, bleeding, infection, perforation,respiratory complication & drug reaction.  The patient agrees with this plan & written consent will be obtained.     Thank you for involving me in the care of this patient.      LOS: 0 days   Sherri Sear, MD  08/23/2022, 10:53 PM    Note: This dictation was prepared with Dragon dictation along with smaller phrase technology. Any transcriptional errors that result from this process are unintentional.

## 2022-08-23 NOTE — Assessment & Plan Note (Addendum)
Patient did not require any treatment for this during the hospital course.  Patient afebrile.

## 2022-08-23 NOTE — Assessment & Plan Note (Addendum)
This was ruled out.  Chest x-ray negative.

## 2022-08-24 ENCOUNTER — Encounter: Payer: Self-pay | Admitting: Internal Medicine

## 2022-08-24 ENCOUNTER — Observation Stay: Payer: PPO | Admitting: Anesthesiology

## 2022-08-24 ENCOUNTER — Encounter
Admission: EM | Disposition: A | Discharge status: Home Health | Payer: Self-pay | Source: Home / Self Care | Attending: Internal Medicine

## 2022-08-24 DIAGNOSIS — I5030 Unspecified diastolic (congestive) heart failure: Secondary | ICD-10-CM | POA: Diagnosis not present

## 2022-08-24 DIAGNOSIS — K922 Gastrointestinal hemorrhage, unspecified: Secondary | ICD-10-CM | POA: Diagnosis not present

## 2022-08-24 DIAGNOSIS — K449 Diaphragmatic hernia without obstruction or gangrene: Secondary | ICD-10-CM

## 2022-08-24 DIAGNOSIS — K529 Noninfective gastroenteritis and colitis, unspecified: Secondary | ICD-10-CM

## 2022-08-24 DIAGNOSIS — J690 Pneumonitis due to inhalation of food and vomit: Secondary | ICD-10-CM

## 2022-08-24 DIAGNOSIS — K219 Gastro-esophageal reflux disease without esophagitis: Secondary | ICD-10-CM

## 2022-08-24 DIAGNOSIS — K920 Hematemesis: Secondary | ICD-10-CM | POA: Diagnosis not present

## 2022-08-24 DIAGNOSIS — D649 Anemia, unspecified: Secondary | ICD-10-CM

## 2022-08-24 DIAGNOSIS — D693 Immune thrombocytopenic purpura: Secondary | ICD-10-CM

## 2022-08-24 DIAGNOSIS — I639 Cerebral infarction, unspecified: Secondary | ICD-10-CM

## 2022-08-24 DIAGNOSIS — R569 Unspecified convulsions: Secondary | ICD-10-CM

## 2022-08-24 HISTORY — PX: ESOPHAGOGASTRODUODENOSCOPY (EGD) WITH PROPOFOL: SHX5813

## 2022-08-24 LAB — CBC
HCT: 30.1 % — ABNORMAL LOW (ref 39.0–52.0)
Hemoglobin: 9.8 g/dL — ABNORMAL LOW (ref 13.0–17.0)
MCH: 31 pg (ref 26.0–34.0)
MCHC: 32.6 g/dL (ref 30.0–36.0)
MCV: 95.3 fL (ref 80.0–100.0)
Platelets: 143 10*3/uL — ABNORMAL LOW (ref 150–400)
RBC: 3.16 MIL/uL — ABNORMAL LOW (ref 4.22–5.81)
RDW: 13.7 % (ref 11.5–15.5)
WBC: 8.2 10*3/uL (ref 4.0–10.5)
nRBC: 0 % (ref 0.0–0.2)

## 2022-08-24 LAB — BASIC METABOLIC PANEL
Anion gap: 6 (ref 5–15)
BUN: 27 mg/dL — ABNORMAL HIGH (ref 8–23)
CO2: 27 mmol/L (ref 22–32)
Calcium: 8.2 mg/dL — ABNORMAL LOW (ref 8.9–10.3)
Chloride: 106 mmol/L (ref 98–111)
Creatinine, Ser: 1.1 mg/dL (ref 0.61–1.24)
GFR, Estimated: >60 mL/min (ref >60)
Glucose, Bld: 81 mg/dL (ref 70–99)
Potassium: 3.7 mmol/L (ref 3.5–5.1)
Sodium: 139 mmol/L (ref 135–145)

## 2022-08-24 SURGERY — ESOPHAGOGASTRODUODENOSCOPY (EGD) WITH PROPOFOL
Anesthesia: General

## 2022-08-24 MED ORDER — LIDOCAINE HCL (CARDIAC) PF 100 MG/5ML IV SOSY
PREFILLED_SYRINGE | INTRAVENOUS | Status: DC | PRN
  Administered 2022-08-24: 50 mg via INTRAVENOUS

## 2022-08-24 MED ORDER — PROPOFOL 500 MG/50ML IV EMUL
INTRAVENOUS | Status: DC | PRN
  Administered 2022-08-24: 150 ug/kg/min via INTRAVENOUS

## 2022-08-24 MED ORDER — PROPOFOL 10 MG/ML IV BOLUS
INTRAVENOUS | Status: DC | PRN
  Administered 2022-08-24: 60 mg via INTRAVENOUS

## 2022-08-24 MED ORDER — PHENYLEPHRINE HCL (PRESSORS) 10 MG/ML IV SOLN
INTRAVENOUS | Status: DC | PRN
  Administered 2022-08-24: 240 ug via INTRAVENOUS

## 2022-08-24 NOTE — Anesthesia Procedure Notes (Signed)
Date/Time: 08/24/2022 2:18 PM  Performed by: Johnna Acosta, CRNAPre-anesthesia Checklist: Patient identified, Emergency Drugs available, Suction available, Patient being monitored and Timeout performed Patient Re-evaluated:Patient Re-evaluated prior to induction Oxygen Delivery Method: Nasal cannula Preoxygenation: Pre-oxygenation with 100% oxygen Induction Type: IV induction

## 2022-08-24 NOTE — Hospital Course (Addendum)
[name] is a 88 y.o. male with past medical history significant of gastritis/duodenitis, CVA, seizure disorder, HTN, idiopathic thrombocytopenic purpura, history of small bowel obstruction presented to the hospital with hematemesis.  This happened after he ate a large salad from [employer].  Shortly afterwards he started having generalized malaise and fatigue and had large nonmelanotic emesis with abdominal bloating followed by multiple episodes of hematemesis.  He also had some shortness of breath.  In the ED patient had stable vitals.  WBC was 12.9.  CT scan of the abdomen and pelvis  demonstrated multiple diffusely fluid-filled distended loops of small bowel measuring up to 4 cm.  Loops are thickened and hyperenhancing, consistent with enterocolitis and diarrheal illness.  Due concern for GI bleed patient was admitted to the hospital  The patient was seen by gastroenterology and had an EGD on 08/24/2022 which showed diffusely moderately erythematous mucosa without bleeding found in the gastric fundus and gastric body.  Also widely patent Schatzki's ring and small hiatal hernia seen.  Hemoglobin upon discharge 9.7, platelet count 135.  Upon discharge the patient did well with physical therapy and recommending home with home health.

## 2022-08-24 NOTE — Op Note (Signed)
[facility] Gastroenterology Patient Name: [name] Procedure Date: 08/24/2022 2:24 PM MRN: [id number] Account #: [id number] Date of Birth: [birth year] Admit Type: Inpatient Age: 88 Room: [facility] ENDO ROOM 3 Gender: Male Note Status: Finalized Instrument Name: Upper Endoscope [id number] Procedure:             Upper GI endoscopy Indications:           Coffee-ground emesis Providers:             Lin Landsman MD, MD Referring MD:          No Local Md, MD (Referring MD) Medicines:             General Anesthesia Complications:         No immediate complications. Estimated blood loss: None. Procedure:             Pre-Anesthesia Assessment:                        - Prior to the procedure, a History and Physical was                         performed, and patient medications and allergies were                         reviewed. The patient is competent. The risks and                         benefits of the procedure and the sedation options and                         risks were discussed with the patient. All questions                         were answered and informed consent was obtained.                         Patient identification and proposed procedure were                         verified by the physician, the nurse, the                         anesthesiologist, the anesthetist and the technician                         in the pre-procedure area in the procedure room in the                         endoscopy suite. Mental Status Examination: alert and                         oriented. Airway Examination: normal oropharyngeal                         airway and neck mobility. Respiratory Examination:                         clear to auscultation. CV Examination: normal.  Prophylactic Antibiotics: The patient does not require                         prophylactic antibiotics. Prior Anticoagulants: The                         patient has taken no  previous anticoagulant or                         antiplatelet agents. ASA Grade Assessment: III - A                         patient with severe systemic disease. After reviewing                         the risks and benefits, the patient was deemed in                         satisfactory condition to undergo the procedure. The                         anesthesia plan was to use general anesthesia.                         Immediately prior to administration of medications,                         the patient was re-assessed for adequacy to receive                         sedatives. The heart rate, respiratory rate, oxygen                         saturations, blood pressure, adequacy of pulmonary                         ventilation, and response to care were monitored                         throughout the procedure. The physical status of the                         patient was re-assessed after the procedure.                        After obtaining informed consent, the endoscope was                         passed under direct vision. Throughout the procedure,                         the patient's blood pressure, pulse, and oxygen                         saturations were monitored continuously. The Endoscope                         was introduced through the mouth, and advanced to the  second part of duodenum. The upper GI endoscopy was                         accomplished without difficulty. The patient tolerated                         the procedure well. Findings:      The duodenal bulb and second portion of the duodenum were normal.      Diffuse moderately erythematous mucosa without bleeding was found in the       gastric fundus and in the gastric body.      The incisura and gastric antrum were normal. Biopsies were taken with a       cold forceps for Helicobacter pylori testing.      The gastroesophageal junction and examined esophagus were normal.      A small  hiatal hernia was present.      A widely patent Schatzki ring was found at the gastroesophageal junction. Impression:            - Normal duodenal bulb and second portion of the                         duodenum.                        - Erythematous mucosa in the gastric fundus and                         gastric body.                        - Normal incisura and antrum. Biopsied.                        - Normal gastroesophageal junction and esophagus.                        - Small hiatal hernia.                        - Widely patent Schatzki ring. Recommendation:        - Return patient to hospital ward for possible                         discharge same day.                        - Advance diet as tolerated today.                        - Continue present medications.                        - Follow an antireflux regimen indefinitely.                        - Use Protonix (pantoprazole) 40 mg PO daily for 1                         month. Procedure Code(s):     --- Professional ---  60454, Esophagogastroduodenoscopy, flexible,                         transoral; with biopsy, single or multiple Diagnosis Code(s):     --- Professional ---                        K31.89, Other diseases of stomach and duodenum                        K44.9, Diaphragmatic hernia without obstruction or                         gangrene                        K22.2, Esophageal obstruction                        K92.0, Hematemesis CPT copyright 2019 [employer]. All rights reserved. The codes documented in this report are preliminary and upon coder review may  be revised to meet current compliance requirements. Dr. Ulyess Mort Lin Landsman MD, MD 08/24/2022 2:33:11 PM This report has been signed electronically. Number of Addenda: 0 Note Initiated On: 08/24/2022 2:24 PM Estimated Blood Loss:  Estimated blood loss: none.      [facility]

## 2022-08-24 NOTE — Progress Notes (Signed)
PROGRESS NOTE    MAYA SCHOLER  QMV:[id number] DOB: 21-Mar-1932 DOA: 08/23/2022 PCP: Lynnell Jude, MD    Brief Narrative:  ARLENE GENOVA is a 88 y.o. male with past medical history significant of gastritis/duodenitis, CVA, seizure disorder, HTN, idiopathic thrombocytopenic purpura, history of small bowel obstruction presented to the hospital with hematemesis.  This happened after he ate a large salad from [place].  Shortly afterwards he started having generalized malaise and fatigue and had large nonmelanotic emesis with abdominal bloating followed by multiple episodes of hematemesis.  He also had some shortness of breath.  In the ED patient had stable vitals.  WBC was 12.9.  CT scan of the abdomen and pelvis  demonstrated multiple diffusely fluid-filled distended loops of small bowel measuring up to 4 cm.  Loops are thickened and hyperenhancing, consistent with enterocolitis and diarrheal illness.  Due concern for GI bleed patient was admitted to the hospital  Assessment and plan.  Upper GI bleed Patient presenting with multiple episodes of hematemesis.  Concerning for Mallory-Weiss tear.  GI has been consulted.  Plan for EGD today.  Continue Protonix twice a day.  Monitor hemoglobin closely.  Hold aspirin and NSAIDs from home.   Enterocolitis With abdominal bloating and emesis.  Subsequently had diarrhea.  Patiently had eaten outside.  GI panel and C. difficile panel negative.  No need for antibiotic.  History of SBO in the past so we will continue to monitor closely.  Antiemetics IV fluids.  Patient to undergo EGD today.  Aspiration pneumonitis (HCC) Possible aspiration pneumonitis secondary to multiple episodes of vomiting.  Saturating 100% on room air.  No need for antibiotics at this time.  Afebrile.  If worsening clinical symptoms might need antibiotic   Normocytic anemia History of iron density anemia as well.  Continue to monitor hemoglobin.  Transfuse as necessary.  Latest  hemoglobin of 9.8.  Iron panel with iron of 27, saturation low at 15.  Ferritin low at 54.  Idiopathic thrombocytopenic purpura (HCC) Previous history of ITP, platelets are borderline low at 143 today.  We will continue to monitor.   Seizure ([place]) Continue Keppra   Stroke ([place]) On home aspirin.  Due to concern for hematemesis and melena we will hold today.  Await for EGD evaluation.   (HFpEF) heart failure with preserved ejection fraction ([place]) Patient with a history of grade 1 diastolic dysfunction with normal EF, lower extremity edema and BNP elevated at 263.  No prior BNP in epic.  No evidence of decompensation, might consider diuretics on discharge.    DVT prophylaxis: SCDs Start: 08/23/22 1525   Code Status:     Code Status: Partial Code  Disposition: Home in 1 to 2 days Status is: Observation The patient will require care spanning > 2 midnights and should be moved to inpatient because: GI bleed, need for EGD, close monitoring   Family Communication: Spoke with daughter-in-law at bedside.  Consultants:  GI  Procedures:  Plan For EGD  Antimicrobials:  None  Anti-infectives (From admission, onward)    Start     Dose/Rate Route Frequency Ordered Stop   08/23/22 1430  ciprofloxacin (CIPRO) IVPB 400 mg  Status:  Discontinued        400 mg 200 mL/hr over 60 Minutes Intravenous  Once 08/23/22 1421 08/23/22 1519   08/23/22 1430  metroNIDAZOLE (FLAGYL) IVPB 500 mg  Status:  Discontinued        500 mg 100 mL/hr over 60 Minutes Intravenous  Once  08/23/22 1421 08/23/22 1519      Subjective: Today, patient was seen and examined at bedside.  Patient states that he feels hungry.  Denies any nausea vomiting.  Has mild cough.  Awaiting for endoscopy.  Patient's daughter-in-law at bedside  Objective: Vitals:   08/23/22 2042 08/23/22 2309 08/24/22 0500 08/24/22 1000  BP:  135/87 122/72 124/88  Pulse: 76 78 75 82  Resp: '16 17 14 16  '$ Temp: 98.1 F (36.7 C) 98 F (36.7 C)  98.1 F (36.7 C) 98.7 F (37.1 C)  TempSrc: Oral Oral Oral Oral  SpO2: 98% 96% 100% 100%  Weight:      Height:       No intake or output data in the 24 hours ending 08/24/22 1307 Filed Weights   08/23/22 1005  Weight: 78 kg    Physical Examination: Body mass index is 25.4 kg/m.  General:  Average built, not in obvious distress, on nasal cannula oxygen HENT:   No scleral pallor or icterus noted. Oral mucosa is moist.  Mild erythema over the facial area. Chest:  Clear breath sounds.  No crackles or wheezes.  CVS: S1 &S2 heard. No murmur.  Regular rate and rhythm. Abdomen: Soft, nontender, nondistended.  Bowel sounds are heard.   Extremities: No cyanosis, clubbing with bilateral lower extremity pitting edema-chronic, peripheral pulses are palpable. Psych: Alert, awake and Communicative, normal mood, CNS:  No cranial nerve deficits.  Power equal in all extremities.   Skin: Warm and dry.  Mild erythema over the facial area.  Band-Aid in the nasal area  Data Reviewed:   CBC: Recent Labs  Lab 08/23/22 1009 08/23/22 2032 08/24/22 0503  WBC 12.9* 10.2 8.2  HGB 10.9* 9.9* 9.8*  HCT 34.5* 31.1* 30.1*  MCV 94.3 94.0 95.3  PLT 155 140* 143*    Basic Metabolic Panel: Recent Labs  Lab 08/23/22 1009 08/24/22 0503  NA 138 139  K 4.0 3.7  CL 100 106  CO2 30 27  GLUCOSE 146* 81  BUN 29* 27*  CREATININE 1.10 1.10  CALCIUM 8.6* 8.2*    Liver Function Tests: Recent Labs  Lab 08/23/22 1009  AST 27  ALT 15  ALKPHOS 57  BILITOT 1.1  PROT 6.1*  ALBUMIN 3.4*     Radiology Studies: CT ABDOMEN PELVIS W CONTRAST  Result Date: 08/23/2022 CLINICAL DATA:  Abdominal pain EXAM: CT ABDOMEN AND PELVIS WITH CONTRAST TECHNIQUE: Multidetector CT imaging of the abdomen and pelvis was performed using the standard protocol following bolus administration of intravenous contrast. RADIATION DOSE REDUCTION: This exam was performed according to the departmental dose-optimization program  which includes automated exposure control, adjustment of the mA and/or kV according to patient size and/or use of iterative reconstruction technique. CONTRAST:  152m OMNIPAQUE IOHEXOL 300 MG/ML  SOLN COMPARISON:  06/18/2020 FINDINGS: Lower chest: Unchanged scarring and or atelectasis of the bilateral lung bases with elevation of the hemidiaphragms. Mild cardiomegaly. Three-vessel coronary artery calcifications. Aortic valve calcifications. Hepatobiliary: No focal liver abnormality is seen. Status post cholecystectomy. No biliary dilatation. Pancreas: Unremarkable. No pancreatic ductal dilatation or surrounding inflammatory changes. Spleen: Normal in size without significant abnormality. Adrenals/Urinary Tract: Adrenal glands are unremarkable. Kidneys are normal, without renal calculi, solid lesion, or hydronephrosis. Bladder is unremarkable. Stomach/Bowel: Stomach is within normal limits. Appendix appears normal. The small bowel is diffusely fluid-filled, with several mildly distended loops measuring up to 4.0 cm. Several loops are somewhat thickened and hyperenhancing, particularly in the right hemiabdomen descending and sigmoid diverticulosis. Colon  is fluid-filled to the rectum. Vascular/Lymphatic: Aortic atherosclerosis. No enlarged abdominal or pelvic lymph nodes. Reproductive: No mass or other significant abnormality. Other: No abdominal wall hernia or abnormality. No ascites. Musculoskeletal: No acute or significant osseous findings. IMPRESSION: 1. The small bowel is diffusely fluid-filled, with several mildly distended loops measuring up to 4.0 cm. Several loops are somewhat thickened and hyperenhancing, particularly in the right hemiabdomen. Colon is fluid-filled to the rectum. Findings are consistent with nonspecific infectious or inflammatory enterocolitis and diarrheal illness. 2. Diverticulosis without evidence of acute diverticulitis. 3. Coronary artery disease. 4. Aortic valve calcifications.  Correlate for echocardiographic evidence of aortic valve dysfunction. Aortic Atherosclerosis (ICD10-I70.0). Electronically Signed   By: Delanna Ahmadi M.D.   On: 08/23/2022 14:10   DG Chest 2 View  Result Date: 08/23/2022 CLINICAL DATA:  Shortness of breath EXAM: CHEST - 2 VIEW COMPARISON:  Chest x-ray dated June 18, 2020 FINDINGS: Cardiac and mediastinal contours are unchanged. Mild left basilar opacity, likely atelectasis. No pleural effusion or pneumothorax. IMPRESSION: Mild left basilar opacity, likely atelectasis. Electronically Signed   By: Yetta Glassman M.D.   On: 08/23/2022 14:08      LOS: 0 days    Flora Lipps, MD [facility] Available via Epic secure chat 7am-7pm After these hours, please refer to coverage provider listed on [email or web address] 08/24/2022, 1:07 PM

## 2022-08-24 NOTE — Anesthesia Preprocedure Evaluation (Addendum)
Anesthesia Evaluation  Patient identified by MRN, date of birth, ID band Patient awake    Reviewed: Allergy & Precautions, NPO status , Patient's Chart, lab work & pertinent test results  Airway Mallampati: II  TM Distance: >3 FB Neck ROM: Full    Dental  (+) Partial Lower, Partial Upper   Pulmonary shortness of breath,  Possible aspiration pneumonitis   Pulmonary exam normal        Cardiovascular Exercise Tolerance: Poor hypertension, +CHF (HFpEF BNP elevated at 263)  Normal cardiovascular exam  08/23/2022: Normal sinus rhythm Left posterior fascicular block Abnormal QRS-T angle, consider primary T wave abnormality Abnormal ECG When compared   Neuro/Psych Seizures - (Last seizure more than 6 months ago. Stable on current Kepra dose), Well Controlled,  CVA negative psych ROS   GI/Hepatic GERD  Medicated and Controlled,  Endo/Other    Renal/GU CRFRenal disease     Musculoskeletal  (+) Arthritis ,   Abdominal Normal abdominal exam  (+)   Peds  Hematology  (+) Blood dyscrasia, anemia , thrombocytopenia   Anesthesia Other Findings   Reproductive/Obstetrics                           Anesthesia Physical  Anesthesia Plan  ASA: III  Anesthesia Plan: General   Post-op Pain Management:    Induction: Intravenous  PONV Risk Score and Plan: 2 and Propofol infusion, TIVA and Treatment may vary due to age or medical condition  Airway Management Planned: Natural Airway  Additional Equipment: None  Intra-op Plan:   Post-operative Plan:   Informed Consent: I have reviewed the patients History and Physical, chart, labs and discussed the procedure including the risks, benefits and alternatives for the proposed anesthesia with the patient or authorized representative who has indicated his/her understanding and acceptance.     Dental advisory given  Plan Discussed with: CRNA  Anesthesia  Plan Comments: (Pt has partial dnr. Suspended for procedure.)      Anesthesia Quick Evaluation

## 2022-08-24 NOTE — Anesthesia Postprocedure Evaluation (Signed)
Anesthesia Post Note  Patient: [name]  Procedure(s) Performed: ESOPHAGOGASTRODUODENOSCOPY (EGD) WITH PROPOFOL  Patient location during evaluation: Endoscopy Anesthesia Type: General Level of consciousness: awake and alert Pain management: pain level controlled Vital Signs Assessment: post-procedure vital signs reviewed and stable Respiratory status: spontaneous breathing, nonlabored ventilation, respiratory function stable and patient connected to nasal cannula oxygen Cardiovascular status: blood pressure returned to baseline and stable Postop Assessment: no apparent nausea or vomiting Anesthetic complications: no   No notable events documented.   Last Vitals:  Vitals:   08/24/22 1445 08/24/22 1455  BP: (!) 89/67 121/79  Pulse: (!) 57   Resp: 14 18  Temp:    SpO2: 100% 98%    Last Pain:  Vitals:   08/24/22 1435  TempSrc: Temporal  PainSc: Asleep                 Precious Haws Bertrand Vowels

## 2022-08-24 NOTE — Transfer of Care (Signed)
Immediate Anesthesia Transfer of Care Note  Patient: [name]  Procedure(s) Performed: ESOPHAGOGASTRODUODENOSCOPY (EGD) WITH PROPOFOL  Patient Location: PACU  Anesthesia Type:General  Level of Consciousness: sedated  Airway & Oxygen Therapy: Patient Spontanous Breathing  Post-op Assessment: Report given to RN and Post -op Vital signs reviewed and stable  Post vital signs: Reviewed and stable  Last Vitals:  Vitals Value Taken Time  BP 111/70 08/24/22 1435  Temp 35.8 C 08/24/22 1435  Pulse 57 08/24/22 1437  Resp 15 08/24/22 1437  SpO2 93 % 08/24/22 1437  Vitals shown include unvalidated device data.  Last Pain:  Vitals:   08/24/22 1435  TempSrc: Temporal  PainSc:       Patients Stated Pain Goal: 0 (28/97/91 5041)  Complications: No notable events documented.

## 2022-08-24 NOTE — Progress Notes (Signed)
Incontinent of urine. Bath given and red areas noted on buttocks with abrasion present on admission from Home/ED. Protective patch applied. Instructed patient to turn from side to side every hour and he agreed.

## 2022-08-25 ENCOUNTER — Encounter: Payer: Self-pay | Admitting: Gastroenterology

## 2022-08-25 DIAGNOSIS — E039 Hypothyroidism, unspecified: Secondary | ICD-10-CM | POA: Diagnosis present

## 2022-08-25 DIAGNOSIS — D693 Immune thrombocytopenic purpura: Secondary | ICD-10-CM | POA: Diagnosis present

## 2022-08-25 DIAGNOSIS — Z862 Personal history of diseases of the blood and blood-forming organs and certain disorders involving the immune mechanism: Secondary | ICD-10-CM | POA: Diagnosis not present

## 2022-08-25 DIAGNOSIS — Z79899 Other long term (current) drug therapy: Secondary | ICD-10-CM | POA: Diagnosis not present

## 2022-08-25 DIAGNOSIS — Z885 Allergy status to narcotic agent status: Secondary | ICD-10-CM | POA: Diagnosis not present

## 2022-08-25 DIAGNOSIS — K921 Melena: Secondary | ICD-10-CM | POA: Diagnosis present

## 2022-08-25 DIAGNOSIS — K529 Noninfective gastroenteritis and colitis, unspecified: Secondary | ICD-10-CM | POA: Diagnosis present

## 2022-08-25 DIAGNOSIS — D649 Anemia, unspecified: Secondary | ICD-10-CM | POA: Diagnosis not present

## 2022-08-25 DIAGNOSIS — E876 Hypokalemia: Secondary | ICD-10-CM | POA: Diagnosis not present

## 2022-08-25 DIAGNOSIS — N189 Chronic kidney disease, unspecified: Secondary | ICD-10-CM | POA: Diagnosis present

## 2022-08-25 DIAGNOSIS — I5030 Unspecified diastolic (congestive) heart failure: Secondary | ICD-10-CM | POA: Diagnosis not present

## 2022-08-25 DIAGNOSIS — K920 Hematemesis: Secondary | ICD-10-CM | POA: Diagnosis present

## 2022-08-25 DIAGNOSIS — G40909 Epilepsy, unspecified, not intractable, without status epilepticus: Secondary | ICD-10-CM | POA: Diagnosis present

## 2022-08-25 DIAGNOSIS — K922 Gastrointestinal hemorrhage, unspecified: Secondary | ICD-10-CM | POA: Diagnosis not present

## 2022-08-25 DIAGNOSIS — Z85828 Personal history of other malignant neoplasm of skin: Secondary | ICD-10-CM | POA: Diagnosis not present

## 2022-08-25 DIAGNOSIS — K449 Diaphragmatic hernia without obstruction or gangrene: Secondary | ICD-10-CM | POA: Diagnosis present

## 2022-08-25 DIAGNOSIS — D519 Vitamin B12 deficiency anemia, unspecified: Secondary | ICD-10-CM | POA: Diagnosis present

## 2022-08-25 DIAGNOSIS — Z8673 Personal history of transient ischemic attack (TIA), and cerebral infarction without residual deficits: Secondary | ICD-10-CM | POA: Diagnosis not present

## 2022-08-25 DIAGNOSIS — J690 Pneumonitis due to inhalation of food and vomit: Secondary | ICD-10-CM | POA: Diagnosis present

## 2022-08-25 DIAGNOSIS — I129 Hypertensive chronic kidney disease with stage 1 through stage 4 chronic kidney disease, or unspecified chronic kidney disease: Secondary | ICD-10-CM | POA: Diagnosis present

## 2022-08-25 DIAGNOSIS — Z79890 Hormone replacement therapy (postmenopausal): Secondary | ICD-10-CM | POA: Diagnosis not present

## 2022-08-25 DIAGNOSIS — R5381 Other malaise: Secondary | ICD-10-CM | POA: Diagnosis present

## 2022-08-25 DIAGNOSIS — D509 Iron deficiency anemia, unspecified: Secondary | ICD-10-CM | POA: Diagnosis present

## 2022-08-25 DIAGNOSIS — D62 Acute posthemorrhagic anemia: Secondary | ICD-10-CM | POA: Diagnosis present

## 2022-08-25 DIAGNOSIS — K219 Gastro-esophageal reflux disease without esophagitis: Secondary | ICD-10-CM | POA: Diagnosis present

## 2022-08-25 DIAGNOSIS — Z20822 Contact with and (suspected) exposure to covid-19: Secondary | ICD-10-CM | POA: Diagnosis present

## 2022-08-25 DIAGNOSIS — K222 Esophageal obstruction: Secondary | ICD-10-CM | POA: Diagnosis present

## 2022-08-25 LAB — CBC
HCT: 30.8 % — ABNORMAL LOW (ref 39.0–52.0)
Hemoglobin: 10.1 g/dL — ABNORMAL LOW (ref 13.0–17.0)
MCH: 30.2 pg (ref 26.0–34.0)
MCHC: 32.8 g/dL (ref 30.0–36.0)
MCV: 92.2 fL (ref 80.0–100.0)
Platelets: 144 10*3/uL — ABNORMAL LOW (ref 150–400)
RBC: 3.34 MIL/uL — ABNORMAL LOW (ref 4.22–5.81)
RDW: 13.4 % (ref 11.5–15.5)
WBC: 10.7 10*3/uL — ABNORMAL HIGH (ref 4.0–10.5)
nRBC: 0 % (ref 0.0–0.2)

## 2022-08-25 LAB — BASIC METABOLIC PANEL
Anion gap: 11 (ref 5–15)
BUN: 23 mg/dL (ref 8–23)
CO2: 25 mmol/L (ref 22–32)
Calcium: 8.2 mg/dL — ABNORMAL LOW (ref 8.9–10.3)
Chloride: 103 mmol/L (ref 98–111)
Creatinine, Ser: 1.06 mg/dL (ref 0.61–1.24)
GFR, Estimated: >60 mL/min (ref >60)
Glucose, Bld: 65 mg/dL — ABNORMAL LOW (ref 70–99)
Potassium: 3.4 mmol/L — ABNORMAL LOW (ref 3.5–5.1)
Sodium: 139 mmol/L (ref 135–145)

## 2022-08-25 LAB — PHOSPHORUS: Phosphorus: 3 mg/dL (ref 2.5–4.6)

## 2022-08-25 LAB — MAGNESIUM: Magnesium: 1.4 mg/dL — ABNORMAL LOW (ref 1.7–2.4)

## 2022-08-25 MED ORDER — SODIUM CHLORIDE 0.9 % IV SOLN: INTRAVENOUS | Status: DC

## 2022-08-25 MED ORDER — PANTOPRAZOLE SODIUM 40 MG PO TBEC
40.0000 mg | DELAYED_RELEASE_TABLET | Freq: Two times a day (BID) | ORAL | Status: DC
  Administered 2022-08-25 – 2022-08-26 (×2): 40 mg via ORAL
  Filled 2022-08-25 (×2): qty 1

## 2022-08-25 MED ORDER — POTASSIUM CHLORIDE CRYS ER 20 MEQ PO TBCR
40.0000 meq | EXTENDED_RELEASE_TABLET | Freq: Once | ORAL | Status: AC
  Administered 2022-08-25: 40 meq via ORAL
  Filled 2022-08-25: qty 2

## 2022-08-25 MED ORDER — DICLOFENAC SODIUM 1 % EX GEL
2.0000 g | Freq: Four times a day (QID) | CUTANEOUS | Status: DC
  Administered 2022-08-25 – 2022-08-26 (×5): 2 g via TOPICAL
  Filled 2022-08-25: qty 100

## 2022-08-25 MED ORDER — MAGNESIUM OXIDE -MG SUPPLEMENT 400 (240 MG) MG PO TABS
400.0000 mg | ORAL_TABLET | Freq: Two times a day (BID) | ORAL | Status: DC
  Administered 2022-08-25 – 2022-08-26 (×3): 400 mg via ORAL
  Filled 2022-08-25 (×3): qty 1

## 2022-08-25 MED ORDER — MAGNESIUM SULFATE 2 GM/50ML IV SOLN
2.0000 g | Freq: Once | INTRAVENOUS | Status: AC
  Administered 2022-08-25: 2 g via INTRAVENOUS
  Filled 2022-08-25: qty 50

## 2022-08-25 NOTE — Evaluation (Signed)
Occupational Therapy Evaluation Patient Details Name: [name] MRN: [id number] DOB: 1932-10-23 Today's Date: 08/25/2022   History of Present Illness Pt is an 88 yo that presented to the ED for presented to the hospital with hematemesis, admitted for concerns for GI bleed. PMH of ITP, anemia, CKD, seizures, CVA, SBO, HTN.   Clinical Impression   Patient presenting with decreased Ind in self care, balance, functional mobility/transfers, and endurance. Patient reports living at home with wife and Mod I with SPC at baseline. Pt is very active at home and independent in self care tasks. Pt's wife brought him a neoprene knee brace for L knee that he reports using periodically at baseline. Pt needing mod A to stand from standard height surface. He was incontinent of BM in bed and ambulating 20' with RW and min A with max cuing for safety awareness and upright position.  Pt needing max A for clothing management and hygiene after having BM. OT brought recliner chair into bathroom for stand pivot from toilet>recliner secondary to increased fatigue and pain.Pt reports son can help him but he works during the day. I anticipate this is more assistance than his wife is able to provide at home as we are far from baseline.   Patient will benefit from acute OT to increase overall independence in the areas of ADLs, functional mobility,and safety awareness  in order to safely discharge to next venue of care.      Recommendations for follow up therapy are one component of a multi-disciplinary discharge planning process, led by the attending physician.  Recommendations may be updated based on patient status, additional functional criteria and insurance authorization.   Follow Up Recommendations  Skilled nursing-short term rehab (<3 hours/day)    Assistance Recommended at Discharge Intermittent Supervision/Assistance  Patient can return home with the following A lot of help with walking and/or transfers;A lot of  help with bathing/dressing/bathroom;Help with stairs or ramp for entrance;Assist for transportation    Functional Status Assessment  Patient has had a recent decline in their functional status and demonstrates the ability to make significant improvements in function in a reasonable and predictable amount of time.  Equipment Recommendations  None recommended by OT       Precautions / Restrictions Precautions Precautions: Fall Restrictions Weight Bearing Restrictions: No      Mobility Bed Mobility Overal bed mobility: Needs Assistance Bed Mobility: Supine to Sit, Sit to Supine     Supine to sit: Mod assist Sit to supine: Mod assist   General bed mobility comments: assist for BLE, and trunk elevation    Transfers Overall transfer level: Needs assistance Equipment used: Rolling walker (2 wheels) Transfers: Sit to/from Stand, Bed to chair/wheelchair/BSC Sit to Stand: Mod assist     Step pivot transfers: Mod assist            Balance Overall balance assessment: Needs assistance Sitting-balance support: Feet supported Sitting balance-Leahy Scale: Fair     Standing balance support: Reliant on assistive device for balance Standing balance-Leahy Scale: Poor                             ADL either performed or assessed with clinical judgement   ADL Overall ADL's : Needs assistance/impaired                         Toilet Transfer: Moderate assistance;BSC/3in1   Toileting- Clothing Manipulation and Hygiene: Sit  to/from stand;Maximal assistance       Functional mobility during ADLs: Moderate assistance;Rolling walker (2 wheels);Minimal assistance       Vision Patient Visual Report: No change from baseline              Pertinent Vitals/Pain Pain Assessment Pain Assessment: Faces Faces Pain Scale: Hurts little more Pain Location: L knee Pain Descriptors / Indicators: Aching, Grimacing, Guarding Pain Intervention(s): Limited activity  within patient's tolerance, Other (comment), Monitored during session     Hand Dominance Right   Extremity/Trunk Assessment Upper Extremity Assessment Upper Extremity Assessment: Generalized weakness   Lower Extremity Assessment Lower Extremity Assessment: Generalized weakness       Communication Communication Communication: No difficulties   Cognition Arousal/Alertness: Awake/alert Behavior During Therapy: WFL for tasks assessed/performed Overall Cognitive Status: Within Functional Limits for tasks assessed                                                  Home Living Family/patient expects to be discharged to:: Private residence Living Arrangements: Spouse/significant other Available Help at Discharge: Family Type of Home: House Home Access: Stairs to enter Technical brewer of Steps: 3 Entrance Stairs-Rails: Right Home Layout: One level     Bathroom Shower/Tub: Teacher, early years/pre: Standard     Home Equipment: Conservation officer, nature (2 wheels);Cane - single point          Prior Functioning/Environment Prior Level of Function : Independent/Modified Independent;Driving             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 ADLs Comments: Ind in all aspects of care        OT Problem List: Decreased strength;Cardiopulmonary status limiting activity;Decreased activity tolerance;Decreased safety awareness;Impaired balance (sitting and/or standing);Decreased knowledge of precautions      OT Treatment/Interventions: Self-care/ADL training;Therapeutic exercise;Therapeutic activities;Energy conservation;Manual therapy;Balance training;Patient/family education    OT Goals(Current goals can be found in the care plan section) Acute Rehab OT Goals Patient Stated Goal: to go home OT Goal Formulation: With patient Time For Goal Achievement: 09/08/22 Potential to Achieve Goals: Good  OT Frequency: Min 2X/week       AM-PAC OT  "6 Clicks" Daily Activity     Outcome Measure Help from another person eating meals?: None Help from another person taking care of personal grooming?: A Little Help from another person toileting, which includes using toliet, bedpan, or urinal?: A Lot Help from another person bathing (including washing, rinsing, drying)?: A Lot Help from another person to put on and taking off regular upper body clothing?: None Help from another person to put on and taking off regular lower body clothing?: A Lot 6 Click Score: 17   End of Session Equipment Utilized During Treatment: Rolling walker (2 wheels) Nurse Communication: Mobility status  Activity Tolerance: Patient limited by pain;Patient limited by fatigue Patient left: in chair;with call bell/phone within reach;with chair alarm set  OT Visit Diagnosis: Unsteadiness on feet (R26.81);Repeated falls (R29.6);Muscle weakness (generalized) (M62.81)                Time: 0277-4128 OT Time Calculation (min): 21 min Charges:  OT General Charges $OT Visit: 1 Visit OT Evaluation $OT Eval Moderate Complexity: 1 Mod OT Treatments $Self Care/Home Management : 8-22 mins  Darleen Crocker, MS, OTR/L , CBIS ascom [phone]  08/25/22, 4:18 PM

## 2022-08-25 NOTE — Plan of Care (Signed)
  Problem: Pain Managment: Goal: General experience of comfort will improve Outcome: Progressing   Problem: Safety: Goal: Ability to remain free from injury will improve Outcome: Progressing   Problem: Skin Integrity: Goal: Risk for impaired skin integrity will decrease Outcome: Progressing   

## 2022-08-25 NOTE — TOC Progression Note (Signed)
Transition of Care [facility]) - Progression Note    Patient Details  Name: [name] MRN: [id number] Date of Birth: 06-08-1932  [facility] CM/SW [place], RN Phone Number: 08/25/2022, 2:40 PM  Clinical Narrative:    Patient lives at home with his wife and is generally independent They prefer to wait until tomorrow and see how he does before looking at going to SNF, they prefer to go home        Expected Discharge Plan and Services                                                 Social Determinants of Health (SDOH) Interventions    Readmission Risk Interventions     No data to display

## 2022-08-25 NOTE — Plan of Care (Signed)

## 2022-08-25 NOTE — Evaluation (Signed)
Physical Therapy Evaluation Patient Details Name: [name] MRN: [id number] DOB: May 13, 1932 Today's Date: 08/25/2022  History of Present Illness  Pt is an 88 yo that presented to the ED for presented to the hospital with hematemesis, admitted for concerns for GI bleed. PMH of ITP, anemia, CKD, seizures, CVA, SBO, HTN.   Clinical Impression  Patient alert, agreeable to PT. 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   The patient was able to perform bed mobility with modA. Unable to complete without physical assistance. Significant time spent prior to each mobility attempt to allow pt maximize independence, but very challenged by fatigue and LLE pain today. Sit <> stand with modA and RW. Only able to take 1 step to the left with mod-maxA prior to returning to sitting EOB. Returned to supine with all needs in reach.  Overall the patient demonstrated deficits (see "PT Problem List") that impede the patient's functional abilities, safety, and mobility and would benefit from skilled PT intervention. Recommendation is SNF due to current level of assistance needed and decline from PLOF.        Recommendations for follow up therapy are one component of a multi-disciplinary discharge planning process, led by the attending physician.  Recommendations may be updated based on patient status, additional functional criteria and insurance authorization.  Follow Up Recommendations Skilled nursing-short term rehab (<3 hours/day) Can patient physically be transported by private vehicle: No    Assistance Recommended at Discharge Frequent or constant Supervision/Assistance  Patient can return home with the following  A lot of help with walking and/or transfers;A lot of help with bathing/dressing/bathroom;Assistance with feeding;Assist for transportation;Direct supervision/assist for financial management;Help with stairs or ramp for entrance;Assistance  with cooking/housework    Equipment Recommendations None recommended by PT  Recommendations for Other Services       Functional Status Assessment Patient has had a recent decline in their functional status and demonstrates the ability to make significant improvements in function in a reasonable and predictable amount of time.     Precautions / Restrictions Precautions Precautions: Fall Restrictions Weight Bearing Restrictions: No      Mobility  Bed Mobility Overal bed mobility: Needs Assistance Bed Mobility: Supine to Sit, Sit to Supine     Supine to sit: Mod assist Sit to supine: Mod assist   General bed mobility comments: assist for BLE, and trunk elevation    Transfers Overall transfer level: Needs assistance Equipment used: Rolling walker (2 wheels) Transfers: Sit to/from Stand Sit to Stand: Mod assist                Ambulation/Gait Ambulation/Gait assistance: Mod assist Gait Distance (Feet): 1 Feet Assistive device: Rolling walker (2 wheels)            Stairs            Wheelchair Mobility    Modified Rankin (Stroke Patients Only)       Balance Overall balance assessment: Needs assistance Sitting-balance support: Feet supported Sitting balance-Leahy Scale: Fair     Standing balance support: Reliant on assistive device for balance Standing balance-Leahy Scale: Poor                               Pertinent Vitals/Pain Pain Assessment Pain Assessment: Faces Faces Pain Scale: Hurts little more Pain Location: L knee Pain Descriptors / Indicators: Aching, Grimacing, Guarding Pain Intervention(s): Limited  activity within patient's tolerance, Monitored during session, Repositioned    Home Living Family/patient expects to be discharged to:: Private residence Living Arrangements: Spouse/significant other Available Help at Discharge: Family Type of Home: House Home Access: Stairs to enter Entrance Stairs-Rails:  Right Entrance Stairs-Number of Steps: 3   Home Layout: One level [place]: Conservation officer, nature (2 wheels);Cane - single point      Prior Function Prior Level of Function : Independent/Modified Independent;Driving             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 [place]        Extremity/Trunk Assessment   Upper Extremity Assessment Upper Extremity Assessment: Generalized weakness    Lower Extremity Assessment Lower Extremity Assessment: Generalized weakness (difficulty lifting LLE)       Communication   Communication: No difficulties  Cognition Arousal/Alertness: Awake/alert Behavior During Therapy: WFL for tasks assessed/performed Overall Cognitive Status: Within Functional Limits for tasks assessed                                          General Comments      Exercises     Assessment/Plan    PT Assessment Patient needs continued PT services  PT Problem List Decreased strength;Decreased mobility;Decreased range of motion;Decreased activity tolerance;Decreased balance;Pain;Decreased knowledge of use of DME       PT Treatment Interventions DME instruction;Therapeutic exercise;Gait training;Balance training;Stair training;Neuromuscular re-education;Functional mobility training;Therapeutic activities;Patient/family education    PT Goals (Current goals can be found in the Care Plan section)  Acute Rehab PT Goals Patient Stated Goal: to go home PT Goal Formulation: With patient Time For Goal Achievement: 09/08/22 Potential to Achieve Goals: Fair    Frequency Min 2X/week     Co-evaluation               AM-PAC PT "6 Clicks" Mobility  Outcome Measure Help needed turning from your back to your side while in a flat bed without using bedrails?: A Little Help needed moving from lying on your back to sitting on the side of a flat bed without using bedrails?: A Little Help needed moving to and from a bed to a  chair (including a wheelchair)?: A Lot Help needed standing up from a chair using your arms (e.g., wheelchair or bedside chair)?: A Lot Help needed to walk in hospital room?: Total Help needed climbing 3-5 steps with a railing? : Total 6 Click Score: 12    End of Session Equipment Utilized During Treatment: Gait belt Activity Tolerance: Patient limited by fatigue;Patient limited by pain Patient left: in bed;with call bell/phone within reach;with bed alarm set Nurse Communication: Mobility status PT Visit Diagnosis: Other abnormalities of gait and mobility (R26.89);Difficulty in walking, not elsewhere classified (R26.2);Muscle weakness (generalized) (M62.81);Pain Pain - Right/Left: Left Pain - part of body: Knee    Time: 5027-7412 PT Time Calculation (min) (ACUTE ONLY): 15 min   Charges:   PT Evaluation $PT Eval Low Complexity: 1 Low PT Treatments $Therapeutic Activity: 8-22 mins       Lieutenant Diego PT, DPT 1:08 PM,08/25/22

## 2022-08-25 NOTE — Progress Notes (Signed)
PROGRESS NOTE    [name]  HYI:[id number] DOB: 1932-08-22 DOA: 08/23/2022 PCP: Lynnell Jude, MD    Brief Narrative:  [name] is a 88 y.o. male with past medical history significant of gastritis/duodenitis, CVA, seizure disorder, HTN, idiopathic thrombocytopenic purpura, history of small bowel obstruction presented to the hospital with hematemesis.  This happened after he ate a large salad from [place].  Shortly afterwards he started having generalized malaise and fatigue and had large nonmelanotic emesis with abdominal bloating followed by multiple episodes of hematemesis.  He also had some shortness of breath.  In the ED patient had stable vitals.  WBC was 12.9.  CT scan of the abdomen and pelvis  demonstrated multiple diffusely fluid-filled distended loops of small bowel measuring up to 4 cm.  Loops are thickened and hyperenhancing, consistent with enterocolitis and diarrheal illness.  Due concern for GI bleed, patient was admitted to the hospital for further evaluation and treatment.  During hospitalization, patient was seen by GI.  Patient underwent endoscopic evaluation with findings of gastric erythema.  Subsequently patient was very weak and deconditioned and unable to ambulate.  At baseline uses cane for ambulation.  Physical therapy has now recommended skilled nursing facility placement.  Assessment and plan. Principal Problem:   Upper GI bleed Active Problems:   Enterocolitis   Aspiration pneumonitis (HCC)   Normocytic anemia   Idiopathic thrombocytopenic purpura (HCC)   Seizure (HCC)   Stroke (HCC)   (HFpEF) heart failure with preserved ejection fraction (HCC)   Hiatal hernia with GERD without esophagitis   Upper GI bleed Patient presented with multiple episodes of hematemesis concerning for Mallory-Weiss tear.  GI consulted and underwent EGD on 08/24/2022 with findings of gastric erythema without any obvious tear.  Continue Protonix daily as per GI.  Advised  against NSAIDs and aspirin.    Vomiting 1 episode today.  We will start patient on some IV fluids.  Downgrade diet if unable to tolerate.  Enterocolitis With abdominal bloating and emesis.  Subsequently had diarrhea.    GI panel and C. difficile panel negative.   History of SBO in the past so we will continue to monitor closely.  Status post endoscopy.  No indication for antibiotic.  Hypokalemia.  We will replace orally.  Check levels in AM.  Hypomagnesemia.  We will replace orally.  Check levels in AM.  Phosphorus level within normal limits.  Aspiration pneumonitis (HCC) Possible aspiration pneumonitis secondary to multiple episodes of vomiting.  Saturating 100% on room air.  No need for antibiotics at this time.  Afebrile.    Normocytic anemia History of iron deficiency anemia as well.  Continue to monitor hemoglobin.  Transfuse as necessary.  Latest hemoglobin of 10.1.  Iron panel with iron of 27, saturation low at 15.  Ferritin low at 54.  No need for transfusion.  Idiopathic thrombocytopenic purpura (HCC) Previous history of ITP, platelets are borderline low at 144 today.  We will continue to monitor.   Seizure ([place]) Continue Keppra   Stroke ([place]) On home aspirin.  Due to concern for hematemesis and melena, we will continue to hold for today.  Restart on discharge.  (HFpEF) heart failure with preserved ejection fraction ([place]) Patient with a history of grade 1 diastolic dysfunction with normal EF, lower extremity edema and BNP elevated at 263.  No prior BNP in the computer..  No evidence of decompensation.  Debility, deconditioning, weakness.  Patient was able to ambulate with the help of cane  at home home but was able to take an only here in the hospital.  Seen by physical therapy and recommended skilled nursing facility placement at this time.  Patient believes that he can do more at home.  Bilateral osteoarthritis.  Left knee effusion no pain in the right.  Joint line  tenderness.  No obvious erythema.  Patient states that he gets steroid injections frequently and was supposed to go for 1 but missed it due to coming to the hospital    DVT prophylaxis: SCDs Start: 08/23/22 1525  Code Status:     Code Status: Partial Code  Disposition: Skilled nursing facility as per PT evaluation.  Family is hopeful about going home if he continues to improve.  We will consult TOC.  Status is: Observation  The patient will require care spanning > 2 midnights and should be moved to inpatient because: GI bleed, status post EGD, IV fluids, ambulatory dysfunction- need for rehabilitation.   Family Communication: Spoke with daughter-in-law at bedside on 08/24/2022.  Spoke with the patient's wife at length on the phone on 08/25/2022  Consultants:  GI  Procedures:  EGD on 08/24/2022  Antimicrobials:  None  Anti-infectives (From admission, onward)    Start     Dose/Rate Route Frequency Ordered Stop   08/23/22 1430  ciprofloxacin (CIPRO) IVPB 400 mg  Status:  Discontinued        400 mg 200 mL/hr over 60 Minutes Intravenous  Once 08/23/22 1421 08/23/22 1519   08/23/22 1430  metroNIDAZOLE (FLAGYL) IVPB 500 mg  Status:  Discontinued        500 mg 100 mL/hr over 60 Minutes Intravenous  Once 08/23/22 1421 08/23/22 1519      Subjective: Today, patient was seen and examined at bedside.  He had 1 episode of vomiting today but feels better after that.  Physical therapy reported that he was only able to stand.  At baseline patient is able to ambulate with the help of cane.  Objective: Vitals:   08/24/22 1549 08/24/22 2058 08/25/22 0601 08/25/22 0754  BP: 135/66 (!) 171/81 130/61 119/67  Pulse: 78 90 93 93  Resp: '16 18 17 18  '$ Temp: 97.9 F (36.6 C) 99.4 F (37.4 C) 98.8 F (37.1 C) 99.4 F (37.4 C)  TempSrc:  Oral    SpO2: 100% 91% 95% 94%  Weight:      Height:        Intake/Output Summary (Last 24 hours) at 08/25/2022 1410 Last data filed at 08/25/2022  0842 Gross per 24 hour  Intake 500 ml  Output 1000 ml  Net -500 ml   Filed Weights   08/23/22 1005  Weight: 78 kg    Physical Examination: Body mass index is 25.4 kg/m.   General:  Average built, not in obvious distress, on room air, elderly male, Communicative, HENT:   No scleral pallor or icterus noted. Oral mucosa is moist.  Chest:  Clear breath sounds.  No crackles or wheezes.  CVS: S1 &S2 heard. No murmur.  Regular rate and rhythm. Abdomen: Soft, nontender, nondistended.  Bowel sounds are heard.   Extremities: No cyanosis, clubbing with bilateral pitting edema, left knee slightly puffy with effusion.  Peripheral pulses are palpable. Psych: Alert, awake and oriented, normal mood CNS:  No cranial nerve deficits.  Moves all extremities. Skin: Warm and dry.  No rashes noted.   Data Reviewed:   CBC: Recent Labs  Lab 08/23/22 1009 08/23/22 2032 08/24/22 0503 08/25/22 0541  WBC 12.9*  10.2 8.2 10.7*  HGB 10.9* 9.9* 9.8* 10.1*  HCT 34.5* 31.1* 30.1* 30.8*  MCV 94.3 94.0 95.3 92.2  PLT 155 140* 143* 144*     Basic Metabolic Panel: Recent Labs  Lab 08/23/22 1009 08/24/22 0503 08/25/22 0541  NA 138 139 139  K 4.0 3.7 3.4*  CL 100 106 103  CO2 '30 27 25  '$ GLUCOSE 146* 81 65*  BUN 29* 27* 23  CREATININE 1.10 1.10 1.06  CALCIUM 8.6* 8.2* 8.2*  MG  --   --  1.4*  PHOS  --   --  3.0     Liver Function Tests: Recent Labs  Lab 08/23/22 1009  AST 27  ALT 15  ALKPHOS 57  BILITOT 1.1  PROT 6.1*  ALBUMIN 3.4*      Radiology Studies: No results found.    LOS: 0 days    Flora Lipps, MD [facility] Available via Epic secure chat 7am-7pm After these hours, please refer to coverage provider listed on [email or web address] 08/25/2022, 2:10 PM

## 2022-08-26 LAB — CBC
HCT: 29.7 % — ABNORMAL LOW (ref 39.0–52.0)
Hemoglobin: 9.7 g/dL — ABNORMAL LOW (ref 13.0–17.0)
MCH: 29.7 pg (ref 26.0–34.0)
MCHC: 32.7 g/dL (ref 30.0–36.0)
MCV: 90.8 fL (ref 80.0–100.0)
Platelets: 135 10*3/uL — ABNORMAL LOW (ref 150–400)
RBC: 3.27 MIL/uL — ABNORMAL LOW (ref 4.22–5.81)
RDW: 13.4 % (ref 11.5–15.5)
WBC: 7.9 10*3/uL (ref 4.0–10.5)
nRBC: 0 % (ref 0.0–0.2)

## 2022-08-26 LAB — BASIC METABOLIC PANEL
Anion gap: 7 (ref 5–15)
BUN: 26 mg/dL — ABNORMAL HIGH (ref 8–23)
CO2: 23 mmol/L (ref 22–32)
Calcium: 7.8 mg/dL — ABNORMAL LOW (ref 8.9–10.3)
Chloride: 106 mmol/L (ref 98–111)
Creatinine, Ser: 0.99 mg/dL (ref 0.61–1.24)
GFR, Estimated: >60 mL/min (ref >60)
Glucose, Bld: 62 mg/dL — ABNORMAL LOW (ref 70–99)
Potassium: 4.1 mmol/L (ref 3.5–5.1)
Sodium: 136 mmol/L (ref 135–145)

## 2022-08-26 LAB — MAGNESIUM: Magnesium: 2 mg/dL (ref 1.7–2.4)

## 2022-08-26 MED ORDER — ASPIRIN EC 81 MG PO TBEC: 81.0000 mg | DELAYED_RELEASE_TABLET | Freq: Every day | ORAL | 11 refills | Status: DC

## 2022-08-26 MED ORDER — PANTOPRAZOLE SODIUM 40 MG PO TBEC: 40.0000 mg | DELAYED_RELEASE_TABLET | Freq: Every day | ORAL | 0 refills | Status: DC

## 2022-08-26 MED ORDER — MAGNESIUM OXIDE -MG SUPPLEMENT 400 (240 MG) MG PO TABS: 400.0000 mg | ORAL_TABLET | Freq: Every day | ORAL | 0 refills | Status: DC

## 2022-08-26 NOTE — Progress Notes (Signed)
Physical Therapy Treatment Patient Details Name: [name] MRN: [id number] DOB: [birth year] Today's Date: 08/26/2022   History of Present Illness Pt is an 88 yo that presented to the ED for presented to the hospital with hematemesis, admitted for concerns for GI bleed. PMH of ITP, anemia, CKD, seizures, CVA, SBO, HTN.    PT Comments    Pt was long sitting in bed upon arrival. He agrees to session and is endorsing feeling much better than previous date. Demonstrated safe ability to stand, ambulate, and ascend/descend stairs. Overall pt presents with much improved abilities.Is cleared by acute PT to safely DC home with HHPT to follow.    Recommendations for follow up therapy are one component of a multi-disciplinary discharge planning process, led by the attending physician.  Recommendations may be updated based on patient status, additional functional criteria and insurance authorization.  Follow Up Recommendations  Home health PT     Assistance Recommended at Discharge Intermittent Supervision/Assistance  Patient can return home with the following A little help with walking and/or transfers;A little help with bathing/dressing/bathroom;Assistance with cooking/housework;Assistance with feeding;Direct supervision/assist for medications management;Direct supervision/assist for financial management;Assist for transportation;Help with stairs or ramp for entrance   Equipment Recommendations  None recommended by PT       Precautions / Restrictions Precautions Precautions: Fall Restrictions Weight Bearing Restrictions: No     Mobility  Bed Mobility Overal bed mobility: Needs Assistance Bed Mobility: Supine to Sit, Sit to Supine     Supine to sit: Min guard Sit to supine: Min guard   Transfers Overall transfer level: Needs assistance Equipment used: Rolling walker (2 wheels) Transfers: Sit to/from Stand Sit to Stand: Min guard           General transfer comment: pt  demonstrated much improved abilities and safety today. Was easily and safely able to stand from EOB/WC to RW.    Ambulation/Gait Ambulation/Gait assistance: Min guard, Supervision Gait Distance (Feet): 100 Feet Assistive device: Rolling walker (2 wheels) Gait Pattern/deviations: Step-through pattern, Trunk flexed Gait velocity: decreased     General Gait Details: Pt was able to ambulate 100 ft with RW + chair follow. No LOB or safety concerns during gait.   Stairs Stairs: Yes       General stair comments: pt demonstrated safe ability to ascend/descend stairs     Balance Overall balance assessment: Needs assistance Sitting-balance support: Feet supported Sitting balance-Leahy Scale: Good Sitting balance - Comments: pt sat EOB without assistance   Standing balance support: Bilateral upper extremity supported, During functional activity, Reliant on assistive device for balance Standing balance-Leahy Scale: Fair Standing balance comment: Highly recommend pt use RW at all times      Cognition Arousal/Alertness: Awake/alert Behavior During Therapy: WFL for tasks assessed/performed Overall Cognitive Status: Within Functional Limits for tasks assessed        General Comments: Pt is A and O x 3               Pertinent Vitals/Pain Pain Assessment Pain Assessment: 0-10 Pain Score: 2  Pain Location: L knee Pain Descriptors / Indicators: Aching, Grimacing, Guarding Pain Intervention(s): Limited activity within patient's tolerance, Monitored during session, Premedicated before session, Repositioned     PT Goals (current goals can now be found in the care plan section) Acute Rehab PT Goals Patient Stated Goal: to go home Progress towards PT goals: Progressing toward goals    Frequency    Min 2X/week      PT Plan Discharge plan  needs to be updated       AM-PAC PT "6 Clicks" Mobility   Outcome Measure  Help needed turning from your back to your side while in  a flat bed without using bedrails?: A Little Help needed moving from lying on your back to sitting on the side of a flat bed without using bedrails?: A Little Help needed moving to and from a bed to a chair (including a wheelchair)?: A Little Help needed standing up from a chair using your arms (e.g., wheelchair or bedside chair)?: A Little Help needed to walk in hospital room?: A Little Help needed climbing 3-5 steps with a railing? : A Little 6 Click Score: 18    End of Session Equipment Utilized During Treatment: Gait belt Activity Tolerance: Patient tolerated treatment well;Patient limited by fatigue Patient left: in bed;with call bell/phone within reach;with bed alarm set (seated EOB with RN aware) Nurse Communication: Mobility status PT Visit Diagnosis: Other abnormalities of gait and mobility (R26.89);Difficulty in walking, not elsewhere classified (R26.2);Muscle weakness (generalized) (M62.81);Pain Pain - Right/Left: Left Pain - part of body: Knee     Time: 4315-4008 PT Time Calculation (min) (ACUTE ONLY): 24 min  Charges:  $Gait Training: 8-22 mins $Therapeutic Activity: 8-22 mins                     Julaine Fusi PTA 08/26/22, 11:59 AM

## 2022-08-26 NOTE — TOC Transition Note (Signed)
Transition of Care [facility]) - CM/SW Discharge Note   Patient Details  Name: [name] MRN: [id number] Date of Birth: Oct 22, 1932  [facility] CM/SW Contact:  Valente David, RN Phone Number: 08/26/2022, 12:23 PM   Clinical Narrative:     Patient ready for discharge, PT recommendations now for home health PT/OT, prefers [facility].  Spoke with Corene Cornea, able to accept patient and will start on Monday.  Daughter in law [place] made aware, verbalized understanding.   Final next level of care: [employer] Barriers to Discharge: Barriers Resolved   Patient Goals and CMS Choice Patient states their goals for this hospitalization and ongoing recovery are:: Home with home health CMS Medicare.gov Compare Post Acute Care list provided to:: Patient Choice offered to / list presented to : Patient  Discharge Placement                       Discharge Plan and Services                          HH Arranged: PT, OT [facility] Agency: [place] ([facility]) Date [place]: 08/26/22 Time [place]: 1223 Representative spoke with at [place]: [place] ([place]) Interventions     Readmission Risk Interventions     No data to display

## 2022-08-26 NOTE — Assessment & Plan Note (Signed)
Continue PPI ?

## 2022-08-26 NOTE — Discharge Summary (Signed)
Physician Discharge Summary   Patient: [name] MRN: [id number] DOB: 30-Apr-1932  Admit date:     08/23/2022  Discharge date: 08/26/22  Discharge Physician: Loletha Grayer   PCP: Lynnell Jude, MD   Recommendations at discharge:   Follow-up PCP 5 days   Discharge Diagnoses: Principal Problem:   Upper GI bleed Active Problems:   Enterocolitis   Aspiration pneumonitis (HCC)   Normocytic anemia   Idiopathic thrombocytopenic purpura (HCC)   Seizure (HCC)   Stroke (HCC)   (HFpEF) heart failure with preserved ejection fraction (HCC)   Hiatal hernia with GERD without esophagitis   GI bleed    Hospital Course: [name] is a 88 y.o. male with past medical history significant of gastritis/duodenitis, CVA, seizure disorder, HTN, idiopathic thrombocytopenic purpura, history of small bowel obstruction presented to the hospital with hematemesis.  This happened after he ate a large salad from [place].  Shortly afterwards he started having generalized malaise and fatigue and had large nonmelanotic emesis with abdominal bloating followed by multiple episodes of hematemesis.  He also had some shortness of breath.  In the ED patient had stable vitals.  WBC was 12.9.  CT scan of the abdomen and pelvis  demonstrated multiple diffusely fluid-filled distended loops of small bowel measuring up to 4 cm.  Loops are thickened and hyperenhancing, consistent with enterocolitis and diarrheal illness.  Due concern for GI bleed patient was admitted to the hospital  The patient was seen by gastroenterology and had an EGD on 08/24/2022 which showed diffusely moderately erythematous mucosa without bleeding found in the gastric fundus and gastric body.  Also widely patent Schatzki's ring and small hiatal hernia seen.  Hemoglobin upon discharge 9.7, platelet count 135.  Upon discharge the patient did well with physical therapy and recommending home with home health.   Assessment and Plan: * Upper  GI bleed EGD showing an erythematous stomach without any signs of bleeding.  Hemoglobin remained stable on 9.7 upon discharge.  Patient was given IV Protonix here in the hospital.  For 1 month will be on Protonix at night and Aciphex during the day. Can go back to Aciphex during the day.  We held his aspirin here in the hospital.  Recommending aspirin 81 mg once a day and not twice a day.  Enterocolitis Stool studies negative.  This has improved.  Tolerating diet.  Aspiration pneumonitis [facility]) Patient did not require any treatment for this during the hospital course.  Patient afebrile.  Normocytic anemia With ferritin being 54, this goes along with iron deficiency anemia  Idiopathic thrombocytopenic purpura (HCC) Previous history of ITP.  Last platelet count 135.  Seizure ([place]) - Continue home [place]  Stroke [facility]) Aspirin held while in the hospital.  He states he was taking it twice a day at home.  I advised once a day.  (HFpEF) heart failure with preserved ejection fraction ([place]) This was ruled out.  Chest x-ray negative.  Hiatal hernia with GERD without esophagitis Continue PPI         Consultants: Gastroenterology Procedures performed: EGD Disposition: Home health Diet recommendation:  Cardiac diet DISCHARGE MEDICATION: Allergies as of 08/26/2022       Reactions   Meperidine Hives        Medication List     STOP taking these medications    amoxicillin-clavulanate 875-125 MG tablet Commonly known as: AUGMENTIN   doxycycline 100 MG tablet Commonly known as: VIBRA-TABS   fluconazole 150 MG tablet Commonly known as:  DIFLUCAN   Ibuprofen 200 MG Caps Commonly known as: Advil Liqui-Gels minis   valACYclovir 1000 MG tablet Commonly known as: VALTREX       TAKE these medications    acetaminophen 325 MG tablet Commonly known as: TYLENOL Take 650 mg by mouth every 6 (six) hours as needed for mild pain.   aspirin EC 81 MG tablet Take 1 tablet (81  mg total) by mouth daily. What changed: when to take this   ferrous sulfate 325 (65 FE) MG tablet Take 325 mg by mouth every other day as needed.   levETIRAcetam 500 MG tablet Commonly known as: KEPPRA Take 1 tablet (500 mg total) by mouth 2 (two) times daily. Seizure prevention.   levothyroxine 50 MCG tablet Commonly known as: SYNTHROID Take 50 mcg by mouth daily.   magnesium oxide 400 (240 Mg) MG tablet Commonly known as: MAG-OX Take 1 tablet (400 mg total) by mouth daily.   nystatin cream Commonly known as: MYCOSTATIN Apply topically 2 (two) times daily.   pantoprazole 40 MG tablet Commonly known as: PROTONIX Take 1 tablet (40 mg total) by mouth at bedtime.   PHILLIPS COLON HEALTH PO Take by mouth daily.   RABEprazole 20 MG tablet Commonly known as: ACIPHEX Take 1 tablet by mouth daily. 1 hour before meals   STOOL SOFTENER LAXATIVE PO Take by mouth.   triamcinolone cream 0.1 % Commonly known as: KENALOG Apply topically 2 (two) times daily.   VITAMIN B12 PO Take by mouth daily.   VITAMIN D PO Take by mouth daily.        Follow-up Information     Lynnell Jude, MD Follow up in 5 day(s).   Specialty: Family Medicine Contact information: [address] [place] [address] [phone]                Discharge Exam: Filed Weights   08/23/22 1005  Weight: 78 kg   Physical Exam HENT:     Head: Normocephalic.     Mouth/Throat:     Pharynx: No oropharyngeal exudate.  Eyes:     General: Lids are normal.     Conjunctiva/sclera: Conjunctivae normal.  Cardiovascular:     Rate and Rhythm: Normal rate and regular rhythm.     Heart sounds: Normal heart sounds, S1 normal and S2 normal.  Pulmonary:     Breath sounds: No decreased breath sounds, wheezing, rhonchi or rales.  Abdominal:     Palpations: Abdomen is soft.     Tenderness: There is no abdominal tenderness.  Musculoskeletal:     Right lower leg: No swelling.     Left lower leg: No  swelling.  Skin:    General: Skin is warm.     Findings: No rash.  Neurological:     Mental Status: He is alert and oriented to person, place, and time.     Comments: Able to straight leg raise bilaterally.      Condition at discharge: stable  The results of significant diagnostics from this hospitalization (including imaging, microbiology, ancillary and laboratory) are listed below for reference.   Imaging Studies: CT ABDOMEN PELVIS W CONTRAST  Result Date: 08/23/2022 CLINICAL DATA:  Abdominal pain EXAM: CT ABDOMEN AND PELVIS WITH CONTRAST TECHNIQUE: Multidetector CT imaging of the abdomen and pelvis was performed using the standard protocol following bolus administration of intravenous contrast. RADIATION DOSE REDUCTION: This exam was performed according to the departmental dose-optimization program which includes automated exposure control, adjustment of the mA and/or  kV according to patient size and/or use of iterative reconstruction technique. CONTRAST:  158m OMNIPAQUE IOHEXOL 300 MG/ML  SOLN COMPARISON:  06/18/2020 FINDINGS: Lower chest: Unchanged scarring and or atelectasis of the bilateral lung bases with elevation of the hemidiaphragms. Mild cardiomegaly. Three-vessel coronary artery calcifications. Aortic valve calcifications. Hepatobiliary: No focal liver abnormality is seen. Status post cholecystectomy. No biliary dilatation. Pancreas: Unremarkable. No pancreatic ductal dilatation or surrounding inflammatory changes. Spleen: Normal in size without significant abnormality. Adrenals/Urinary Tract: Adrenal glands are unremarkable. Kidneys are normal, without renal calculi, solid lesion, or hydronephrosis. Bladder is unremarkable. Stomach/Bowel: Stomach is within normal limits. Appendix appears normal. The small bowel is diffusely fluid-filled, with several mildly distended loops measuring up to 4.0 cm. Several loops are somewhat thickened and hyperenhancing, particularly in the right  hemiabdomen descending and sigmoid diverticulosis. Colon is fluid-filled to the rectum. Vascular/Lymphatic: Aortic atherosclerosis. No enlarged abdominal or pelvic lymph nodes. Reproductive: No mass or other significant abnormality. Other: No abdominal wall hernia or abnormality. No ascites. Musculoskeletal: No acute or significant osseous findings. IMPRESSION: 1. The small bowel is diffusely fluid-filled, with several mildly distended loops measuring up to 4.0 cm. Several loops are somewhat thickened and hyperenhancing, particularly in the right hemiabdomen. Colon is fluid-filled to the rectum. Findings are consistent with nonspecific infectious or inflammatory enterocolitis and diarrheal illness. 2. Diverticulosis without evidence of acute diverticulitis. 3. Coronary artery disease. 4. Aortic valve calcifications. Correlate for echocardiographic evidence of aortic valve dysfunction. Aortic Atherosclerosis (ICD10-I70.0). Electronically Signed   By: ADelanna AhmadiM.D.   On: 08/23/2022 14:10   DG Chest 2 View  Result Date: 08/23/2022 CLINICAL DATA:  Shortness of breath EXAM: CHEST - 2 VIEW COMPARISON:  Chest x-ray dated June 18, 2020 FINDINGS: Cardiac and mediastinal contours are unchanged. Mild left basilar opacity, likely atelectasis. No pleural effusion or pneumothorax. IMPRESSION: Mild left basilar opacity, likely atelectasis. Electronically Signed   By: LYetta GlassmanM.D.   On: 08/23/2022 14:08    Microbiology: Results for orders placed or performed during the hospital encounter of 08/23/22  Resp Panel by RT-PCR (Flu A&B, Covid) Anterior Nasal Swab     Status: None   Collection Time: 08/23/22  1:29 PM   Specimen: Anterior Nasal Swab  Result Value Ref Range Status   SARS Coronavirus 2 by RT PCR NEGATIVE NEGATIVE Final    Comment: (NOTE) SARS-CoV-2 target nucleic acids are NOT DETECTED.  The SARS-CoV-2 RNA is generally detectable in upper respiratory specimens during the acute phase of  infection. The lowest concentration of SARS-CoV-2 viral copies this assay can detect is 138 copies/mL. A negative result does not preclude SARS-Cov-2 infection and should not be used as the sole basis for treatment or other patient management decisions. A negative result may occur with  improper specimen collection/handling, submission of specimen other than nasopharyngeal swab, presence of viral mutation(s) within the areas targeted by this assay, and inadequate number of viral copies(<138 copies/mL). A negative result must be combined with clinical observations, patient history, and epidemiological information. The expected result is Negative.  Fact Sheet for Patients:  hEntrepreneurPulse.com.au Fact Sheet for Healthcare Providers:  hIncredibleEmployment.be This test is no t yet approved or cleared by the [place] and  has been authorized for detection and/or diagnosis of SARS-CoV-2 by FDA under an Emergency Use Authorization (EUA). This EUA will remain  in effect (meaning this test can be used) for the duration of the COVID-19 declaration under Section 564(b)(1) of the Act, 21 U.S.C.section 360bbb-3(b)(1),  unless the authorization is terminated  or revoked sooner.       Influenza A by PCR NEGATIVE NEGATIVE Final   Influenza B by PCR NEGATIVE NEGATIVE Final    Comment: (NOTE) The Xpert Xpress SARS-CoV-2/FLU/RSV plus assay is intended as an aid in the diagnosis of influenza from Nasopharyngeal swab specimens and should not be used as a sole basis for treatment. Nasal washings and aspirates are unacceptable for Xpert Xpress SARS-CoV-2/FLU/RSV testing.  Fact Sheet for Patients: EntrepreneurPulse.com.au  Fact Sheet for Healthcare Providers: IncredibleEmployment.be  This test is not yet approved or cleared by the [place] FDA and has been authorized for detection and/or diagnosis of SARS-CoV-2  by FDA under an Emergency Use Authorization (EUA). This EUA will remain in effect (meaning this test can be used) for the duration of the COVID-19 declaration under Section 564(b)(1) of the Act, 21 U.S.C. section 360bbb-3(b)(1), unless the authorization is terminated or revoked.  Performed at [facility], [address] [place], [place] [address]   Gastrointestinal Panel by PCR , Stool     Status: None   Collection Time: 08/23/22  2:54 PM   Specimen: Stool  Result Value Ref Range Status   Campylobacter species NOT DETECTED NOT DETECTED Final   Plesimonas shigelloides NOT DETECTED NOT DETECTED Final   Salmonella species NOT DETECTED NOT DETECTED Final   Yersinia enterocolitica NOT DETECTED NOT DETECTED Final   Vibrio species NOT DETECTED NOT DETECTED Final   Vibrio cholerae NOT DETECTED NOT DETECTED Final   Enteroaggregative E coli (EAEC) NOT DETECTED NOT DETECTED Final   Enteropathogenic E coli (EPEC) NOT DETECTED NOT DETECTED Final   Enterotoxigenic E coli (ETEC) NOT DETECTED NOT DETECTED Final   Shiga like toxin producing E coli (STEC) NOT DETECTED NOT DETECTED Final   Shigella/Enteroinvasive E coli (EIEC) NOT DETECTED NOT DETECTED Final   Cryptosporidium NOT DETECTED NOT DETECTED Final   Cyclospora cayetanensis NOT DETECTED NOT DETECTED Final   Entamoeba histolytica NOT DETECTED NOT DETECTED Final   Giardia lamblia NOT DETECTED NOT DETECTED Final   Adenovirus F40/41 NOT DETECTED NOT DETECTED Final   Astrovirus NOT DETECTED NOT DETECTED Final   Norovirus GI/GII NOT DETECTED NOT DETECTED Final   Rotavirus A NOT DETECTED NOT DETECTED Final   Sapovirus (I, II, IV, and V) NOT DETECTED NOT DETECTED Final    Comment: Performed at [facility], [address] [place], [place] [address]  C Difficile Quick Screen w PCR reflex     Status: None   Collection Time: 08/23/22  2:54 PM   Specimen: Stool  Result Value Ref Range Status   C Diff antigen NEGATIVE NEGATIVE  Final   C Diff toxin NEGATIVE NEGATIVE Final   C Diff interpretation No C. difficile detected.  Final    Comment: Performed at [facility], [address] [place], [place] [address]    Labs: CBC: Recent Labs  Lab 08/23/22 1009 08/23/22 2032 08/24/22 0503 08/25/22 0541 08/26/22 0610  WBC 12.9* 10.2 8.2 10.7* 7.9  HGB 10.9* 9.9* 9.8* 10.1* 9.7*  HCT 34.5* 31.1* 30.1* 30.8* 29.7*  MCV 94.3 94.0 95.3 92.2 90.8  PLT 155 140* 143* 144* 644*   Basic Metabolic Panel: Recent Labs  Lab 08/23/22 1009 08/24/22 0503 08/25/22 0541 08/26/22 0610  NA 138 139 139 136  K 4.0 3.7 3.4* 4.1  CL 100 106 103 106  CO2 '30 27 25 23  '$ GLUCOSE 146* 81 65* 62*  BUN 29* 27* 23 26*  CREATININE 1.10 1.10 1.06 0.99  CALCIUM 8.6* 8.2* 8.2* 7.8*  MG  --   --  1.4* 2.0  PHOS  --   --  3.0  --    Liver Function Tests: Recent Labs  Lab 08/23/22 1009  AST 27  ALT 15  ALKPHOS 57  BILITOT 1.1  PROT 6.1*  ALBUMIN 3.4*     Discharge time spent: greater than 30 minutes.  Signed: Loletha Grayer, MD [facility] 08/26/2022

## 2022-08-26 NOTE — Discharge Instructions (Signed)
Take protonix at night and aciphex in the morning for one month then go back to aciphex daily

## 2022-08-29 LAB — SURGICAL PATHOLOGY

## 2022-09-01 ENCOUNTER — Encounter: Payer: Self-pay | Admitting: Family Medicine

## 2022-09-28 ENCOUNTER — Encounter: Payer: Self-pay | Admitting: Family Medicine

## 2022-10-13 ENCOUNTER — Other Ambulatory Visit: Payer: Self-pay | Admitting: *Deleted

## 2022-10-13 ENCOUNTER — Inpatient Hospital Stay (HOSPITAL_BASED_OUTPATIENT_CLINIC_OR_DEPARTMENT_OTHER): Payer: PPO | Admitting: Internal Medicine

## 2022-10-13 ENCOUNTER — Encounter: Payer: Self-pay | Admitting: Internal Medicine

## 2022-10-13 ENCOUNTER — Inpatient Hospital Stay: Payer: PPO | Attending: Internal Medicine

## 2022-10-13 DIAGNOSIS — Z8673 Personal history of transient ischemic attack (TIA), and cerebral infarction without residual deficits: Secondary | ICD-10-CM | POA: Insufficient documentation

## 2022-10-13 DIAGNOSIS — K222 Esophageal obstruction: Secondary | ICD-10-CM | POA: Diagnosis not present

## 2022-10-13 DIAGNOSIS — Z85828 Personal history of other malignant neoplasm of skin: Secondary | ICD-10-CM | POA: Diagnosis not present

## 2022-10-13 DIAGNOSIS — Z885 Allergy status to narcotic agent status: Secondary | ICD-10-CM | POA: Insufficient documentation

## 2022-10-13 DIAGNOSIS — Z9049 Acquired absence of other specified parts of digestive tract: Secondary | ICD-10-CM | POA: Insufficient documentation

## 2022-10-13 DIAGNOSIS — Z79899 Other long term (current) drug therapy: Secondary | ICD-10-CM | POA: Insufficient documentation

## 2022-10-13 DIAGNOSIS — D631 Anemia in chronic kidney disease: Secondary | ICD-10-CM | POA: Diagnosis not present

## 2022-10-13 DIAGNOSIS — R140 Abdominal distension (gaseous): Secondary | ICD-10-CM | POA: Diagnosis not present

## 2022-10-13 DIAGNOSIS — N1831 Chronic kidney disease, stage 3a: Secondary | ICD-10-CM | POA: Diagnosis not present

## 2022-10-13 DIAGNOSIS — Z8379 Family history of other diseases of the digestive system: Secondary | ICD-10-CM | POA: Diagnosis not present

## 2022-10-13 DIAGNOSIS — Z8249 Family history of ischemic heart disease and other diseases of the circulatory system: Secondary | ICD-10-CM | POA: Diagnosis not present

## 2022-10-13 DIAGNOSIS — D693 Immune thrombocytopenic purpura: Secondary | ICD-10-CM | POA: Insufficient documentation

## 2022-10-13 LAB — CBC WITH DIFFERENTIAL/PLATELET
Abs Immature Granulocytes: 0.04 10*3/uL (ref 0.00–0.07)
Basophils Absolute: 0 10*3/uL (ref 0.0–0.1)
Basophils Relative: 0 %
Eosinophils Absolute: 0.1 10*3/uL (ref 0.0–0.5)
Eosinophils Relative: 1 %
HCT: 34.6 % — ABNORMAL LOW (ref 39.0–52.0)
Hemoglobin: 11.4 g/dL — ABNORMAL LOW (ref 13.0–17.0)
Immature Granulocytes: 0 %
Lymphocytes Relative: 11 %
Lymphs Abs: 1.1 10*3/uL (ref 0.7–4.0)
MCH: 30.6 pg (ref 26.0–34.0)
MCHC: 32.9 g/dL (ref 30.0–36.0)
MCV: 92.8 fL (ref 80.0–100.0)
Monocytes Absolute: 0.8 10*3/uL (ref 0.1–1.0)
Monocytes Relative: 9 %
Neutro Abs: 7.8 10*3/uL — ABNORMAL HIGH (ref 1.7–7.7)
Neutrophils Relative %: 79 %
Platelets: 169 10*3/uL (ref 150–400)
RBC: 3.73 MIL/uL — ABNORMAL LOW (ref 4.22–5.81)
RDW: 14.6 % (ref 11.5–15.5)
WBC: 9.9 10*3/uL (ref 4.0–10.5)
nRBC: 0 % (ref 0.0–0.2)

## 2022-10-13 LAB — IRON AND TIBC
Iron: 24 ug/dL — ABNORMAL LOW (ref 45–182)
Saturation Ratios: 10 % — ABNORMAL LOW (ref 17.9–39.5)
TIBC: 251 ug/dL (ref 250–450)
UIBC: 227 ug/dL

## 2022-10-13 LAB — FERRITIN: Ferritin: 38 ng/mL (ref 24–336)

## 2022-10-13 LAB — BASIC METABOLIC PANEL
Anion gap: 8 (ref 5–15)
BUN: 24 mg/dL — ABNORMAL HIGH (ref 8–23)
CO2: 24 mmol/L (ref 22–32)
Calcium: 8.3 mg/dL — ABNORMAL LOW (ref 8.9–10.3)
Chloride: 105 mmol/L (ref 98–111)
Creatinine, Ser: 1.09 mg/dL (ref 0.61–1.24)
GFR, Estimated: >60 mL/min (ref >60)
Glucose, Bld: 95 mg/dL (ref 70–99)
Potassium: 4.5 mmol/L (ref 3.5–5.1)
Sodium: 137 mmol/L (ref 135–145)

## 2022-10-13 NOTE — Progress Notes (Signed)
[name] [name] OFFICE PROGRESS NOTE  [name] Care Team: Lynnell Jude, MD as PCP - General (Family Medicine) Kate Sable, MD as PCP - Cardiology (Cardiology) Cammie Sickle, MD as Consulting Physician (Hematology and Oncology)   SUMMARY OF ONCOLOGIC HISTORY:  # SEP 2015- ITP [platelets- 16]; s/p prednisone ; Good Response; currently surveillance  # SEP 2015- Mild Anemia/IDA-CKD-III- ~12 s/p EGD/colo [Oct 2015; Dr.Skulskie] ; Ferritin- 17; NO BMBx;   # CKD creatinine ~1.4 [no nephro] ; 2021-new onset of Seziues   INTERVAL HISTORY: Walks with a cane.  Accompanied by his wife.  88 year old male [name] with history of ITP-is here for follow-up/currently on surveillance.   In the interim [name] was admitted to hospital-for possible upper GI bleed. CT scan of the abdomen and pelvis  demonstrated multiple diffusely fluid-filled distended loops of small bowel measuring up to 4 cm.  Loops are thickened and hyperenhancing, consistent with enterocolitis and diarrheal illness.  [name] was seen by gastroenterology and had an EGD on 08/24/2022 which showed diffusely moderately erythematous mucosa without bleeding found in the gastric fundus and gastric body.  Also widely patent Schatzki's ring and small hiatal   [name] denies gum bleeding or nosebleeds.  Denies any blood in stools or black or stools. Denies any further episodes of seizures.  [name] complains of multiple episodes of urination especially at nighttime chronic.  No burning dysuria.   Review of Systems  Constitutional:  Negative for chills, diaphoresis, fever and malaise/fatigue.  HENT:  Negative for nosebleeds and sore throat.   Eyes:  Negative for double vision.  Respiratory:  Negative for cough, hemoptysis, sputum production, shortness of breath and wheezing.   Cardiovascular:  Negative for chest pain, palpitations, orthopnea and leg swelling.  Gastrointestinal:  Negative for abdominal pain, blood in  stool, constipation, diarrhea, heartburn, melena, nausea and vomiting.  Genitourinary:  Negative for dysuria.  Musculoskeletal:  Negative for back pain and joint pain.  Skin: Negative.  Negative for itching and rash.  Neurological:  Negative for dizziness, tingling, focal weakness, weakness and headaches.  Endo/Heme/Allergies:  Does not bruise/bleed easily.  Psychiatric/Behavioral:  Negative for depression. The [name] is not nervous/anxious and does not have insomnia.      PAST MEDICAL HISTORY :  Past Medical History:  Diagnosis Date   Allergic rhinitis    Arthropathy    B12 deficiency anemia    Chronic neck pain    CVA (cerebral infarction)    GERD (gastroesophageal reflux disease)    History of seizures    HTN (hypertension)    IDA (iron deficiency anemia)    ITP (idiopathic thrombocytopenic purpura) 07/16/2014   Seizures (HCC)    Skin cancer of nose    Wears dentures    partial upper and lower    PAST SURGICAL HISTORY :   Past Surgical History:  Procedure Laterality Date   CHOLECYSTECTOMY  10/13/1988   COLONOSCOPY  10/2014, 08/2014   Dr. Gwenlyn Perking   ESOPHAGOGASTRODUODENOSCOPY (EGD) WITH PROPOFOL N/A 08/24/2022   Procedure: ESOPHAGOGASTRODUODENOSCOPY (EGD) WITH PROPOFOL;  Surgeon: Lin Landsman, MD;  Location: [facility];  Service: Gastroenterology;  Laterality: N/A;   MYRINGOTOMY WITH TUBE PLACEMENT Bilateral 01/27/2021   Procedure: MYRINGOTOMY WITH TUBE PLACEMENT;  Surgeon: Margaretha Sheffield, MD;  Location: [facility];  Service: ENT;  Laterality: Bilateral;   UPPER GI ENDOSCOPY      FAMILY HISTORY :   Family History  Problem Relation Age of Onset   Heart disease Other    Cirrhosis  Mother     SOCIAL HISTORY:   Social History   Tobacco Use   Smoking status: Never   Smokeless tobacco: Never  Vaping Use   Vaping Use: Never used  Substance Use Topics   Alcohol use: No    Alcohol/week: 0.0 standard drinks of alcohol   Drug use: No     ALLERGIES:  is allergic to meperidine.  MEDICATIONS:  Current Outpatient Medications  Medication Sig Dispense Refill   acetaminophen (TYLENOL) 325 MG tablet Take 650 mg by mouth every 6 (six) hours as needed for mild pain.     aspirin EC 81 MG tablet Take 1 tablet (81 mg total) by mouth daily. 30 tablet 11   Cyanocobalamin (VITAMIN B12 PO) Take by mouth daily.     Docusate Sodium (STOOL SOFTENER LAXATIVE PO) Take by mouth.     ferrous sulfate 325 (65 FE) MG tablet Take 325 mg by mouth every other day as needed.     levothyroxine (SYNTHROID) 50 MCG tablet Take 50 mcg by mouth daily.     magnesium oxide (MAG-OX) 400 (240 Mg) MG tablet Take 1 tablet (400 mg total) by mouth daily. 30 tablet 0   nystatin cream (MYCOSTATIN) Apply topically 2 (two) times daily.     pantoprazole (PROTONIX) 40 MG tablet Take 1 tablet (40 mg total) by mouth at bedtime. 30 tablet 0   Probiotic Product ([place]) Take by mouth daily.     RABEprazole (ACIPHEX) 20 MG tablet Take 1 tablet by mouth daily. 1 hour before meals     triamcinolone cream (KENALOG) 0.1 % Apply topically 2 (two) times daily.     VITAMIN D PO Take by mouth daily.     levETIRAcetam (KEPPRA) 500 MG tablet Take 1 tablet (500 mg total) by mouth 2 (two) times daily. Seizure prevention. 60 tablet 2   No current facility-administered medications for this visit.    PHYSICAL EXAMINATION:   BP 127/82 (BP Location: Left Arm, [name] Position: Sitting)   Pulse 87   Temp (!) 97.3 F (36.3 C) (Tympanic)   Wt 165 lb (74.8 kg)   BMI 24.37 kg/m   Filed Weights   10/13/22 1507  Weight: 165 lb (74.8 kg)     Physical Exam Constitutional:      Comments: He is walking himself.  HENT:     Head: Normocephalic and atraumatic.     Mouth/Throat:     Pharynx: No oropharyngeal exudate.  Eyes:     Pupils: Pupils are equal, round, and reactive to light.  Cardiovascular:     Rate and Rhythm: Normal rate and regular rhythm.   Pulmonary:     Effort: No respiratory distress.     Breath sounds: No wheezing.  Abdominal:     General: Bowel sounds are normal. There is no distension.     Palpations: Abdomen is soft. There is no mass.     Tenderness: There is no abdominal tenderness. There is no guarding or rebound.  Musculoskeletal:        General: No tenderness. Normal range of motion.     Cervical back: Normal range of motion and neck supple.  Skin:    General: Skin is warm.  Neurological:     Mental Status: He is alert and oriented to person, place, and time.  Psychiatric:        Mood and Affect: Affect normal.     LABORATORY DATA:  I have reviewed the data as listed  Component Value Date/Time   NA 137 10/13/2022 1445   NA 137 02/24/2015 1131   K 4.5 10/13/2022 1445   K 4.1 02/24/2015 1131   CL 105 10/13/2022 1445   CL 103 02/24/2015 1131   CO2 24 10/13/2022 1445   CO2 29 02/24/2015 1131   GLUCOSE 95 10/13/2022 1445   GLUCOSE 135 (H) 02/24/2015 1131   BUN 24 (H) 10/13/2022 1445   BUN 14 02/24/2015 1131   CREATININE 1.09 10/13/2022 1445   CREATININE 1.32 (H) 02/24/2015 1131   CALCIUM 8.3 (L) 10/13/2022 1445   CALCIUM 8.8 (L) 02/24/2015 1131   PROT 6.1 (L) 08/23/2022 1009   PROT 6.5 02/24/2015 1131   ALBUMIN 3.4 (L) 08/23/2022 1009   ALBUMIN 3.9 02/24/2015 1131   AST 27 08/23/2022 1009   AST 19 02/24/2015 1131   ALT 15 08/23/2022 1009   ALT 11 (L) 02/24/2015 1131   ALKPHOS 57 08/23/2022 1009   ALKPHOS [name] 02/24/2015 1131   BILITOT 1.1 08/23/2022 1009   BILITOT 0.9 02/24/2015 1131   GFRNONAA >60 10/13/2022 1445   GFRNONAA 50 (L) 02/24/2015 1131   GFRAA >60 06/20/2020 0506   GFRAA 58 (L) 02/24/2015 1131    No results found for: "SPEP", "UPEP"  Lab Results  Component Value Date   WBC 9.9 10/13/2022   NEUTROABS 7.8 (H) 10/13/2022   HGB 11.4 (L) 10/13/2022   HCT 34.6 (L) 10/13/2022   MCV 92.8 10/13/2022   PLT 169 10/13/2022      Chemistry      Component Value Date/Time   NA  137 10/13/2022 1445   NA 137 02/24/2015 1131   K 4.5 10/13/2022 1445   K 4.1 02/24/2015 1131   CL 105 10/13/2022 1445   CL 103 02/24/2015 1131   CO2 24 10/13/2022 1445   CO2 29 02/24/2015 1131   BUN 24 (H) 10/13/2022 1445   BUN 14 02/24/2015 1131   CREATININE 1.09 10/13/2022 1445   CREATININE 1.32 (H) 02/24/2015 1131      Component Value Date/Time   CALCIUM 8.3 (L) 10/13/2022 1445   CALCIUM 8.8 (L) 02/24/2015 1131   ALKPHOS 57 08/23/2022 1009   ALKPHOS [name] 02/24/2015 1131   AST 27 08/23/2022 1009   AST 19 02/24/2015 1131   ALT 15 08/23/2022 1009   ALT 11 (L) 02/24/2015 1131   BILITOT 1.1 08/23/2022 1009   BILITOT 0.9 02/24/2015 1131        ASSESSMENT & PLAN:   Idiopathic thrombocytopenic purpura ([place]) # ITP-Steroid responsive.  Currently on surveillance.overall STABLE; Platelets of 162- Continue surveillance.   # IDA-  Mild anemia/likely from CKD-[s/p EGD- OCT 2023-In pt] Hb 12- STABLE.   continue p.o. iron QD.   # CKD-stage II [GFR >60]  STABLE;   #Seizures -STABLE; on keppra.   #DISPOSITION:  # cancel JUNE 2024- appt # follow up in 6 months; MD-  [cbcbmp/ Iron studies/ ferritin]- Dr.B   Dr.Bliss    Cammie Sickle, MD 10/13/2022 4:06 PM

## 2022-10-13 NOTE — Progress Notes (Signed)
Patient here for follow up.  Patient is concerned of spot on tipo of nose. Per patient this has been there since hospital x 2 weeks.

## 2022-10-13 NOTE — Assessment & Plan Note (Addendum)
#   ITP-Steroid responsive.  Currently on surveillance.overall STABLE; Platelets of 162- Continue surveillance.   # IDA-  Mild anemia/likely from CKD-[s/p EGD- OCT 2023-In pt] Hb 12- STABLE.   continue p.o. iron QD.   # CKD-stage II [GFR >60]  STABLE;   #Seizures -STABLE; on keppra.   #DISPOSITION:  # cancel JUNE 2024- appt # follow up in 6 months; MD-  [cbcbmp/ Iron studies/ ferritin]- Dr.B   Dr.Bliss

## 2022-11-09 ENCOUNTER — Encounter: Payer: Self-pay | Admitting: Podiatry

## 2022-11-09 ENCOUNTER — Ambulatory Visit: Payer: PPO | Admitting: Podiatry

## 2022-11-09 ENCOUNTER — Ambulatory Visit (INDEPENDENT_AMBULATORY_CARE_PROVIDER_SITE_OTHER): Payer: PPO

## 2022-11-09 VITALS — BP 130/66 | HR 72

## 2022-11-09 DIAGNOSIS — L97511 Non-pressure chronic ulcer of other part of right foot limited to breakdown of skin: Secondary | ICD-10-CM

## 2022-11-09 DIAGNOSIS — I890 Lymphedema, not elsewhere classified: Secondary | ICD-10-CM

## 2022-11-09 DIAGNOSIS — L539 Erythematous condition, unspecified: Secondary | ICD-10-CM

## 2022-11-09 MED ORDER — ACETAMINOPHEN-CODEINE 300-30 MG PO TABS: 1.0000 | ORAL_TABLET | ORAL | 0 refills | Status: DC | PRN

## 2022-11-09 MED ORDER — DOXYCYCLINE HYCLATE 100 MG PO TABS: 100.0000 mg | ORAL_TABLET | Freq: Two times a day (BID) | ORAL | 0 refills | Status: DC

## 2022-11-09 MED ORDER — SANTYL 250 UNIT/GM EX OINT: 1.0000 | TOPICAL_OINTMENT | Freq: Every day | CUTANEOUS | 0 refills | Status: DC

## 2022-11-09 NOTE — Addendum Note (Signed)
Addended by: Boneta Lucks on: 11/09/2022 01:45 PM   Modules accepted: Orders

## 2022-11-09 NOTE — Progress Notes (Signed)
Subjective:  Patient ID: [name], male    DOB: Apr 13, 1932,  MRN: [id number]  Chief Complaint  Patient presents with   Foot Pain    Patient is here for right foot swelling and pain.    88 y.o. male presents with the above complaint.  Patient presents with right foot and swelling as well as lymphedema to the legs and breaking down of the skin to the toes.  Patient wanted to get it evaluated he had not seen anyone this point seem he has been taking care of the wounds at home.  There is some pain associated with it since the wound has broken down.  He has a wound to the right fifth metatarsal head and left hallux and left first MPJ.  These are very superficial small wounds may be due to underlying swelling.  He does not have diabetes.  He denies any other acute issues.  They came out of nowhere.   Review of Systems: Negative except as noted in the HPI. Denies N/V/F/Ch.  Past Medical History:  Diagnosis Date   Allergic rhinitis    Arthropathy    B12 deficiency anemia    Chronic neck pain    CVA (cerebral infarction)    GERD (gastroesophageal reflux disease)    History of seizures    HTN (hypertension)    IDA (iron deficiency anemia)    ITP (idiopathic thrombocytopenic purpura) 07/16/2014   Seizures (HCC)    Skin cancer of nose    Wears dentures    partial upper and lower    Current Outpatient Medications:    acetaminophen (TYLENOL) 325 MG tablet, Take 650 mg by mouth every 6 (six) hours as needed for mild pain., Disp: , Rfl:    aspirin EC 81 MG tablet, Take 1 tablet (81 mg total) by mouth daily., Disp: 30 tablet, Rfl: 11   collagenase (SANTYL) 250 UNIT/GM ointment, Apply 1 Application topically daily., Disp: 15 g, Rfl: 0   Cyanocobalamin (VITAMIN B12 PO), Take by mouth daily., Disp: , Rfl:    Docusate Sodium (STOOL SOFTENER LAXATIVE PO), Take by mouth., Disp: , Rfl:    doxycycline (VIBRA-TABS) 100 MG tablet, Take 1 tablet (100 mg total) by mouth 2 (two) times daily., Disp: 28  tablet, Rfl: 0   ferrous sulfate 325 (65 FE) MG tablet, Take 325 mg by mouth every other day as needed., Disp: , Rfl:    levothyroxine (SYNTHROID) 50 MCG tablet, Take 50 mcg by mouth daily., Disp: , Rfl:    magnesium oxide (MAG-OX) 400 (240 Mg) MG tablet, Take 1 tablet (400 mg total) by mouth daily., Disp: 30 tablet, Rfl: 0   nystatin cream (MYCOSTATIN), Apply topically 2 (two) times daily., Disp: , Rfl:    pantoprazole (PROTONIX) 40 MG tablet, Take 1 tablet (40 mg total) by mouth at bedtime., Disp: 30 tablet, Rfl: 0   Probiotic Product ([place]), Take by mouth daily., Disp: , Rfl:    RABEprazole (ACIPHEX) 20 MG tablet, Take 1 tablet by mouth daily. 1 hour before meals, Disp: , Rfl:    triamcinolone cream (KENALOG) 0.1 %, Apply topically 2 (two) times daily., Disp: , Rfl:    VITAMIN D PO, Take by mouth daily., Disp: , Rfl:    levETIRAcetam (KEPPRA) 500 MG tablet, Take 1 tablet (500 mg total) by mouth 2 (two) times daily. Seizure prevention., Disp: 60 tablet, Rfl: 2  Social History   Tobacco Use  Smoking Status Never  Smokeless Tobacco Never  Allergies  Allergen Reactions   Meperidine Hives   Objective:   Vitals:   11/09/22 1101  BP: 130/66  Pulse: 72   There is no height or weight on file to calculate BMI. Constitutional Well developed. Well nourished.  Vascular Dorsalis pedis pulses palpable bilaterally. Posterior tibial pulses palpable bilaterally. Capillary refill normal to all digits.  No cyanosis or clubbing noted. Pedal hair growth normal.  Neurologic Normal speech. Oriented to person, place, and time. Epicritic sensation to light touch grossly present bilaterally.  Dermatologic Superficial ulceration noted limited to the breakdown of the skin to the right fifth metatarsal head first metatarsal head and right hallux.  Mild erythema noted.  Some redness noted due to severe edema/lymphedema to the right leg.  No purulent drainage noted.  Orthopedic:  Normal joint ROM without pain or crepitus bilaterally. No visible deformities. No bony tenderness.   Radiographs: None Assessment:   1. Chronic toe ulcer, right, limited to breakdown of skin ([place])   2. Lymphedema   3. Erythema    Plan:  Patient was evaluated and treated and all questions answered.  Right foot venous stasis ulceration to the toes/foot -All questions and concerns were discussed with the patient in extensive detail. -Given that there is some erythema/redness noted patient will benefit from antibiotics doxycycline was sent for 14 days.  If the redness gets worse have asked him to go to the emergency room they state understanding. -Will benefit from surgical shoe to rule out mechanical/pressure related issues leading to superficial ulceration -Apply Santyl wet-to-dry dressing every single day.  Center was sent to the pharmacy   No follow-ups on file.

## 2022-12-01 ENCOUNTER — Ambulatory Visit: Payer: PPO | Admitting: Podiatry

## 2022-12-12 ENCOUNTER — Ambulatory Visit: Payer: PPO | Admitting: Podiatry

## 2022-12-20 ENCOUNTER — Ambulatory Visit
Admission: RE | Admit: 2022-12-20 | Discharge: 2022-12-20 | Disposition: A | Discharge status: Home / Self Care | Payer: PPO | Source: Ambulatory Visit | Attending: Family Medicine | Admitting: Family Medicine

## 2022-12-20 ENCOUNTER — Ambulatory Visit
Admission: RE | Admit: 2022-12-20 | Discharge: 2022-12-20 | Disposition: A | Discharge status: Home / Self Care | Payer: PPO | Attending: Family Medicine | Admitting: Family Medicine

## 2022-12-20 ENCOUNTER — Other Ambulatory Visit: Payer: Self-pay | Admitting: Family Medicine

## 2022-12-20 DIAGNOSIS — J189 Pneumonia, unspecified organism: Secondary | ICD-10-CM

## 2023-02-10 ENCOUNTER — Emergency Department: Payer: PPO

## 2023-02-10 ENCOUNTER — Other Ambulatory Visit: Payer: Self-pay

## 2023-02-10 ENCOUNTER — Inpatient Hospital Stay
Admission: EM | Admit: 2023-02-10 | Discharge: 2023-02-15 | DRG: 100 | Disposition: A | Discharge status: Home Health | Payer: PPO | Attending: Internal Medicine | Admitting: Internal Medicine

## 2023-02-10 DIAGNOSIS — Z7982 Long term (current) use of aspirin: Secondary | ICD-10-CM

## 2023-02-10 DIAGNOSIS — Z79899 Other long term (current) drug therapy: Secondary | ICD-10-CM | POA: Diagnosis not present

## 2023-02-10 DIAGNOSIS — E876 Hypokalemia: Secondary | ICD-10-CM

## 2023-02-10 DIAGNOSIS — L89321 Pressure ulcer of left buttock, stage 1: Secondary | ICD-10-CM | POA: Diagnosis present

## 2023-02-10 DIAGNOSIS — R569 Unspecified convulsions: Secondary | ICD-10-CM | POA: Diagnosis not present

## 2023-02-10 DIAGNOSIS — I639 Cerebral infarction, unspecified: Secondary | ICD-10-CM | POA: Diagnosis present

## 2023-02-10 DIAGNOSIS — D693 Immune thrombocytopenic purpura: Secondary | ICD-10-CM | POA: Diagnosis present

## 2023-02-10 DIAGNOSIS — D509 Iron deficiency anemia, unspecified: Secondary | ICD-10-CM | POA: Diagnosis present

## 2023-02-10 DIAGNOSIS — Z79890 Hormone replacement therapy (postmenopausal): Secondary | ICD-10-CM

## 2023-02-10 DIAGNOSIS — E872 Acidosis, unspecified: Secondary | ICD-10-CM | POA: Diagnosis present

## 2023-02-10 DIAGNOSIS — J189 Pneumonia, unspecified organism: Secondary | ICD-10-CM | POA: Diagnosis not present

## 2023-02-10 DIAGNOSIS — R7989 Other specified abnormal findings of blood chemistry: Secondary | ICD-10-CM | POA: Insufficient documentation

## 2023-02-10 DIAGNOSIS — D649 Anemia, unspecified: Secondary | ICD-10-CM | POA: Diagnosis not present

## 2023-02-10 DIAGNOSIS — Z85828 Personal history of other malignant neoplasm of skin: Secondary | ICD-10-CM | POA: Diagnosis not present

## 2023-02-10 DIAGNOSIS — E878 Other disorders of electrolyte and fluid balance, not elsewhere classified: Secondary | ICD-10-CM | POA: Diagnosis present

## 2023-02-10 DIAGNOSIS — G40909 Epilepsy, unspecified, not intractable, without status epilepticus: Principal | ICD-10-CM | POA: Diagnosis present

## 2023-02-10 DIAGNOSIS — N1831 Chronic kidney disease, stage 3a: Secondary | ICD-10-CM | POA: Diagnosis present

## 2023-02-10 DIAGNOSIS — N183 Chronic kidney disease, stage 3 unspecified: Secondary | ICD-10-CM | POA: Diagnosis present

## 2023-02-10 DIAGNOSIS — I4891 Unspecified atrial fibrillation: Secondary | ICD-10-CM | POA: Insufficient documentation

## 2023-02-10 DIAGNOSIS — I480 Paroxysmal atrial fibrillation: Secondary | ICD-10-CM | POA: Diagnosis present

## 2023-02-10 DIAGNOSIS — Z862 Personal history of diseases of the blood and blood-forming organs and certain disorders involving the immune mechanism: Secondary | ICD-10-CM | POA: Diagnosis not present

## 2023-02-10 DIAGNOSIS — L8990 Pressure ulcer of unspecified site, unspecified stage: Secondary | ICD-10-CM | POA: Insufficient documentation

## 2023-02-10 DIAGNOSIS — W06XXXA Fall from bed, initial encounter: Secondary | ICD-10-CM | POA: Diagnosis present

## 2023-02-10 DIAGNOSIS — Y92013 Bedroom of single-family (private) house as the place of occurrence of the external cause: Secondary | ICD-10-CM

## 2023-02-10 DIAGNOSIS — G40919 Epilepsy, unspecified, intractable, without status epilepticus: Secondary | ICD-10-CM | POA: Diagnosis not present

## 2023-02-10 DIAGNOSIS — K219 Gastro-esophageal reflux disease without esophagitis: Secondary | ICD-10-CM | POA: Diagnosis present

## 2023-02-10 DIAGNOSIS — I2489 Other forms of acute ischemic heart disease: Secondary | ICD-10-CM | POA: Diagnosis present

## 2023-02-10 DIAGNOSIS — I130 Hypertensive heart and chronic kidney disease with heart failure and stage 1 through stage 4 chronic kidney disease, or unspecified chronic kidney disease: Secondary | ICD-10-CM | POA: Diagnosis present

## 2023-02-10 DIAGNOSIS — Z8673 Personal history of transient ischemic attack (TIA), and cerebral infarction without residual deficits: Secondary | ICD-10-CM

## 2023-02-10 DIAGNOSIS — D519 Vitamin B12 deficiency anemia, unspecified: Secondary | ICD-10-CM | POA: Diagnosis present

## 2023-02-10 DIAGNOSIS — Z888 Allergy status to other drugs, medicaments and biological substances status: Secondary | ICD-10-CM

## 2023-02-10 DIAGNOSIS — I5032 Chronic diastolic (congestive) heart failure: Secondary | ICD-10-CM | POA: Diagnosis present

## 2023-02-10 DIAGNOSIS — I5030 Unspecified diastolic (congestive) heart failure: Secondary | ICD-10-CM | POA: Diagnosis present

## 2023-02-10 DIAGNOSIS — E871 Hypo-osmolality and hyponatremia: Secondary | ICD-10-CM

## 2023-02-10 DIAGNOSIS — L89311 Pressure ulcer of right buttock, stage 1: Secondary | ICD-10-CM | POA: Diagnosis present

## 2023-02-10 LAB — CBC WITH DIFFERENTIAL/PLATELET
Abs Immature Granulocytes: 0.06 10*3/uL (ref 0.00–0.07)
Basophils Absolute: 0 10*3/uL (ref 0.0–0.1)
Basophils Relative: 0 %
Eosinophils Absolute: 0.2 10*3/uL (ref 0.0–0.5)
Eosinophils Relative: 3 %
HCT: 25.7 % — ABNORMAL LOW (ref 39.0–52.0)
Hemoglobin: 8.2 g/dL — ABNORMAL LOW (ref 13.0–17.0)
Immature Granulocytes: 1 %
Lymphocytes Relative: 16 %
Lymphs Abs: 1.1 10*3/uL (ref 0.7–4.0)
MCH: 29.6 pg (ref 26.0–34.0)
MCHC: 31.9 g/dL (ref 30.0–36.0)
MCV: 92.8 fL (ref 80.0–100.0)
Monocytes Absolute: 0.6 10*3/uL (ref 0.1–1.0)
Monocytes Relative: 8 %
Neutro Abs: 5.2 10*3/uL (ref 1.7–7.7)
Neutrophils Relative %: 72 %
Platelets: 168 10*3/uL (ref 150–400)
RBC: 2.77 MIL/uL — ABNORMAL LOW (ref 4.22–5.81)
RDW: 14.7 % (ref 11.5–15.5)
WBC: 7.2 10*3/uL (ref 4.0–10.5)
nRBC: 0 % (ref 0.0–0.2)

## 2023-02-10 LAB — COMPREHENSIVE METABOLIC PANEL
ALT: 9 U/L (ref 0–44)
AST: 21 U/L (ref 15–41)
Albumin: 2.6 g/dL — ABNORMAL LOW (ref 3.5–5.0)
Alkaline Phosphatase: 49 U/L (ref 38–126)
Anion gap: 13 (ref 5–15)
BUN: 16 mg/dL (ref 8–23)
CO2: 20 mmol/L — ABNORMAL LOW (ref 22–32)
Calcium: 5.5 mg/dL — CL (ref 8.9–10.3)
Chloride: 104 mmol/L (ref 98–111)
Creatinine, Ser: 0.96 mg/dL (ref 0.61–1.24)
GFR, Estimated: >60 mL/min (ref >60)
Glucose, Bld: 107 mg/dL — ABNORMAL HIGH (ref 70–99)
Potassium: 3 mmol/L — ABNORMAL LOW (ref 3.5–5.1)
Sodium: 137 mmol/L (ref 135–145)
Total Bilirubin: 0.5 mg/dL (ref 0.3–1.2)
Total Protein: 5.2 g/dL — ABNORMAL LOW (ref 6.5–8.1)

## 2023-02-10 LAB — URINALYSIS, ROUTINE W REFLEX MICROSCOPIC
Bilirubin Urine: NEGATIVE
Glucose, UA: NEGATIVE mg/dL
Ketones, ur: NEGATIVE mg/dL
Nitrite: NEGATIVE
Protein, ur: NEGATIVE mg/dL
Specific Gravity, Urine: 1.01 (ref 1.005–1.030)
pH: 5 (ref 5.0–8.0)

## 2023-02-10 LAB — VITAMIN D 25 HYDROXY (VIT D DEFICIENCY, FRACTURES): Vit D, 25-Hydroxy: 51.07 ng/mL (ref 30–100)

## 2023-02-10 LAB — CBC
HCT: 27.4 % — ABNORMAL LOW (ref 39.0–52.0)
Hemoglobin: 8.8 g/dL — ABNORMAL LOW (ref 13.0–17.0)
MCH: 29.4 pg (ref 26.0–34.0)
MCHC: 32.1 g/dL (ref 30.0–36.0)
MCV: 91.6 fL (ref 80.0–100.0)
Platelets: 189 10*3/uL (ref 150–400)
RBC: 2.99 MIL/uL — ABNORMAL LOW (ref 4.22–5.81)
RDW: 14.8 % (ref 11.5–15.5)
WBC: 8.9 10*3/uL (ref 4.0–10.5)
nRBC: 0 % (ref 0.0–0.2)

## 2023-02-10 LAB — BASIC METABOLIC PANEL
Anion gap: 10 (ref 5–15)
Anion gap: 7 (ref 5–15)
Anion gap: 9 (ref 5–15)
BUN: 14 mg/dL (ref 8–23)
BUN: 14 mg/dL (ref 8–23)
BUN: 12 mg/dL (ref 8–23)
CO2: 22 mmol/L (ref 22–32)
CO2: 22 mmol/L (ref 22–32)
CO2: 23 mmol/L (ref 22–32)
Calcium: 5.9 mg/dL — CL (ref 8.9–10.3)
Calcium: 6.3 mg/dL — CL (ref 8.9–10.3)
Calcium: 6.8 mg/dL — ABNORMAL LOW (ref 8.9–10.3)
Chloride: 106 mmol/L (ref 98–111)
Chloride: 110 mmol/L (ref 98–111)
Chloride: 108 mmol/L (ref 98–111)
Creatinine, Ser: 0.94 mg/dL (ref 0.61–1.24)
Creatinine, Ser: 0.86 mg/dL (ref 0.61–1.24)
Creatinine, Ser: 0.91 mg/dL (ref 0.61–1.24)
GFR, Estimated: >60 mL/min (ref >60)
GFR, Estimated: >60 mL/min (ref >60)
GFR, Estimated: >60 mL/min (ref >60)
Glucose, Bld: 100 mg/dL — ABNORMAL HIGH (ref 70–99)
Glucose, Bld: 88 mg/dL (ref 70–99)
Glucose, Bld: 96 mg/dL (ref 70–99)
Potassium: 3.6 mmol/L (ref 3.5–5.1)
Potassium: 3.7 mmol/L (ref 3.5–5.1)
Potassium: 3.8 mmol/L (ref 3.5–5.1)
Sodium: 138 mmol/L (ref 135–145)
Sodium: 139 mmol/L (ref 135–145)
Sodium: 140 mmol/L (ref 135–145)

## 2023-02-10 LAB — MAGNESIUM
Magnesium: <0.5 mg/dL — CL (ref 1.7–2.4)
Magnesium: 1.4 mg/dL — ABNORMAL LOW (ref 1.7–2.4)
Magnesium: 1.5 mg/dL — ABNORMAL LOW (ref 1.7–2.4)

## 2023-02-10 LAB — ALBUMIN: Albumin: 2.9 g/dL — ABNORMAL LOW (ref 3.5–5.0)

## 2023-02-10 LAB — LACTIC ACID, PLASMA
Lactic Acid, Venous: 3 mmol/L (ref 0.5–1.9)
Lactic Acid, Venous: 1.2 mmol/L (ref 0.5–1.9)

## 2023-02-10 LAB — TROPONIN I (HIGH SENSITIVITY)
Troponin I (High Sensitivity): 47 ng/L — ABNORMAL HIGH (ref <18)
Troponin I (High Sensitivity): 196 ng/L (ref <18)
Troponin I (High Sensitivity): 190 ng/L (ref <18)
Troponin I (High Sensitivity): 162 ng/L (ref <18)
Troponin I (High Sensitivity): 129 ng/L (ref <18)

## 2023-02-10 MED ORDER — MAGNESIUM SULFATE 4 GM/100ML IV SOLN
4.0000 g | Freq: Once | INTRAVENOUS | Status: AC
  Administered 2023-02-10: 4 g via INTRAVENOUS
  Filled 2023-02-10: qty 100

## 2023-02-10 MED ORDER — ENOXAPARIN SODIUM 40 MG/0.4ML IJ SOSY: 40.0000 mg | PREFILLED_SYRINGE | INTRAMUSCULAR | Status: DC

## 2023-02-10 MED ORDER — POTASSIUM CHLORIDE 10 MEQ/100ML IV SOLN
10.0000 meq | INTRAVENOUS | Status: AC
  Administered 2023-02-10 (×3): 10 meq via INTRAVENOUS
  Filled 2023-02-10 (×3): qty 100

## 2023-02-10 MED ORDER — CALCIUM CARBONATE ANTACID 500 MG PO CHEW
800.0000 mg | CHEWABLE_TABLET | Freq: Two times a day (BID) | ORAL | Status: AC
  Administered 2023-02-10 – 2023-02-12 (×4): 800 mg via ORAL
  Filled 2023-02-10 (×4): qty 4

## 2023-02-10 MED ORDER — CALCIUM GLUCONATE-NACL 2-0.675 GM/100ML-% IV SOLN
2.0000 g | Freq: Once | INTRAVENOUS | Status: AC
  Administered 2023-02-10: 2000 mg via INTRAVENOUS
  Filled 2023-02-10: qty 100

## 2023-02-10 MED ORDER — CALCIUM GLUCONATE-NACL 1-0.675 GM/50ML-% IV SOLN
1.0000 g | Freq: Once | INTRAVENOUS | Status: AC
  Administered 2023-02-10: 1000 mg via INTRAVENOUS
  Filled 2023-02-10: qty 50

## 2023-02-10 MED ORDER — LEVETIRACETAM 750 MG PO TABS
750.0000 mg | ORAL_TABLET | Freq: Two times a day (BID) | ORAL | Status: DC
  Administered 2023-02-10 – 2023-02-15 (×10): 750 mg via ORAL
  Filled 2023-02-10 (×10): qty 1

## 2023-02-10 MED ORDER — LEVETIRACETAM IN NACL 1500 MG/100ML IV SOLN
1500.0000 mg | Freq: Once | INTRAVENOUS | Status: AC
  Administered 2023-02-10: 1500 mg via INTRAVENOUS
  Filled 2023-02-10: qty 100

## 2023-02-10 MED ORDER — ASPIRIN 81 MG PO TBEC
81.0000 mg | DELAYED_RELEASE_TABLET | Freq: Every day | ORAL | Status: DC
  Administered 2023-02-10 – 2023-02-15 (×6): 81 mg via ORAL
  Filled 2023-02-10 (×6): qty 1

## 2023-02-10 MED ORDER — GERHARDT'S BUTT CREAM
TOPICAL_CREAM | Freq: Two times a day (BID) | CUTANEOUS | Status: DC
  Filled 2023-02-10: qty 1

## 2023-02-10 MED ORDER — LORAZEPAM 2 MG/ML IJ SOLN: 2.0000 mg | INTRAMUSCULAR | Status: DC | PRN

## 2023-02-10 MED ORDER — LACTATED RINGERS IV BOLUS
1000.0000 mL | Freq: Once | INTRAVENOUS | Status: AC
  Administered 2023-02-10: 1000 mL via INTRAVENOUS

## 2023-02-10 MED ORDER — ENOXAPARIN SODIUM 40 MG/0.4ML IJ SOSY
40.0000 mg | PREFILLED_SYRINGE | INTRAMUSCULAR | Status: DC
  Administered 2023-02-11 – 2023-02-15 (×5): 40 mg via SUBCUTANEOUS
  Filled 2023-02-10 (×5): qty 0.4

## 2023-02-10 NOTE — Assessment & Plan Note (Signed)
Positive breakthrough seizure at home despite regular home Keppra use per wife Was found on the floor in a stupor Usual pattern is generalized spasmodic/GTC behavior per the wife Status post Versed by EMS and route to the ER as well as IV keppra load on arrival  Suspect secondary to decreased p.o. intake and multiple electrolyte abnormalities CT head stable Will check Keppra level Repeat electrolytes Neuroconsult Prn ativan for breakthrough seizure  Follow

## 2023-02-10 NOTE — ED Notes (Signed)
Date and time results received: 02/10/23 0905  (use smartphrase ".now" to insert current time)  Test: Trop Critical Value: 196  Name of Provider Notified: Dr. Ernestina Patches  Orders Received? Or Actions Taken?: see chart

## 2023-02-10 NOTE — Assessment & Plan Note (Signed)
Grade 1 diastolic function by 2D echo January 2021 Appears euvolemic Monitor volume status

## 2023-02-10 NOTE — Assessment & Plan Note (Signed)
Hemoglobin 8.2 today with baseline hemoglobin around 9-11. No active bleeding Will monitor for now Transfuse for hemoglobin less than 7 Consider Hemoccult in the setting of prior history of upper GI bleeding if hemoglobin continues to significantly downtrend Follow

## 2023-02-10 NOTE — ED Notes (Signed)
Date and time results received: 02/10/23 1020  (use smartphrase ".now" to insert current time)  Test: Calcium Critical Value: 5.9  Name of Provider Notified: Dr. Ernestina Patches  Orders Received? Or Actions Taken?: see chart

## 2023-02-10 NOTE — H&P (Addendum)
History and Physical    Patient: [name] [id number] DOB: 1932-07-26 DOA: 02/10/2023 DOS: the patient was seen and examined on 02/10/2023 PCP: Lynnell Jude, MD  Patient coming from: Home  Chief Complaint:  Chief Complaint  Patient presents with   Seizures   HPI: [name] is a 89 y.o. male with medical history significant of seizure disorder, CVA, GERD, hypertension, ITP, anemia, history of upper GI bleed presenting with seizure, electrolyte abnormality, atrial fibrillation, elevated troponin.  History primarily from patient's wife as patient is currently lethargic in the postictal state.  Per report, wife heard a loud grunt from patient in the middle of the night and then a loud thud with patient falling on the floor.  Patient was subsequently found down and lethargic.  EMS subsequently called.  Per the wife, this is usual pattern for patient's prior seizures.  Has been placed on Keppra for management.  Wife reports compliance with regimen with no missed doses.  No recent infections.  No nausea vomiting or abdominal pain.  No chest pain or shortness of breath.  Positive decreased p.o. intake over several weeks.  Has been otherwise stable up until this event.  Patient given 5 mg IM Versed upon arrival by EMS. Presented to the ER afebrile, hemodynamically stable.  Initially placed on nonrebreather now transition to 3 L of nasal cannula.  White count 7.2, hemoglobin 8.2, platelet count 168, urinalysis minimally indicative of infection.  Lactate 1.2, troponin 47, magnesium of less than 0.5.  Potassium of 3.0, calcium of 5.5.  CT head grossly stable.  Given IV Keppra load in the ER.  EKG with atrial fibrillation with heart rate in the 70s. Review of Systems: As mentioned in the history of present illness. All other systems reviewed and are negative. Past Medical History:  Diagnosis Date   Allergic rhinitis    Arthropathy    B12 deficiency anemia    Chronic neck pain    CVA (cerebral  infarction)    GERD (gastroesophageal reflux disease)    History of seizures    HTN (hypertension)    IDA (iron deficiency anemia)    ITP (idiopathic thrombocytopenic purpura) 07/16/2014   Seizures ([place])    Skin cancer of nose    Wears dentures    partial upper and lower   Past Surgical History:  Procedure Laterality Date   CHOLECYSTECTOMY  10/13/1988   COLONOSCOPY  10/2014, 08/2014   Dr. Gwenlyn Perking   ESOPHAGOGASTRODUODENOSCOPY (EGD) WITH PROPOFOL N/A 08/24/2022   Procedure: ESOPHAGOGASTRODUODENOSCOPY (EGD) WITH PROPOFOL;  Surgeon: Lin Landsman, MD;  Location: [facility];  Service: Gastroenterology;  Laterality: N/A;   MYRINGOTOMY WITH TUBE PLACEMENT Bilateral 01/27/2021   Procedure: MYRINGOTOMY WITH TUBE PLACEMENT;  Surgeon: Margaretha Sheffield, MD;  Location: [facility];  Service: ENT;  Laterality: Bilateral;   UPPER GI ENDOSCOPY     Social History:  reports that he has never smoked. He has never used smokeless tobacco. He reports that he does not drink alcohol and does not use drugs.  Allergies  Allergen Reactions   Meperidine Hives    Family History  Problem Relation Age of Onset   Heart disease Other    Cirrhosis Mother     Prior to Admission medications   Medication Sig Start Date End Date Taking? Authorizing Provider  acetaminophen (TYLENOL) 325 MG tablet Take 650 mg by mouth every 6 (six) hours as needed for mild pain.    [provider]  acetaminophen-codeine (TYLENOL #3) 300-30  MG tablet Take 1-2 tablets by mouth every 4 (four) hours as needed for moderate pain. 11/09/22   Felipa Furnace, DPM  aspirin EC 81 MG tablet Take 1 tablet (81 mg total) by mouth daily. 08/26/22   Loletha Grayer, MD  collagenase (SANTYL) 250 UNIT/GM ointment Apply 1 Application topically daily. 11/09/22   Felipa Furnace, DPM  Cyanocobalamin (VITAMIN B12 PO) Take by mouth daily.    [provider]  Docusate Sodium (STOOL SOFTENER LAXATIVE PO) Take by mouth.     [provider]  doxycycline (VIBRA-TABS) 100 MG tablet Take 1 tablet (100 mg total) by mouth 2 (two) times daily. 11/09/22   Felipa Furnace, DPM  ferrous sulfate 325 (65 FE) MG tablet Take 325 mg by mouth every other day as needed.    [provider]  levETIRAcetam (KEPPRA) 500 MG tablet Take 1 tablet (500 mg total) by mouth 2 (two) times daily. Seizure prevention. 06/20/20 08/23/22  Enzo Bi, MD  levothyroxine (SYNTHROID) 50 MCG tablet Take 50 mcg by mouth daily. 05/21/20   [provider]  magnesium oxide (MAG-OX) 400 (240 Mg) MG tablet Take 1 tablet (400 mg total) by mouth daily. 08/26/22   Loletha Grayer, MD  nystatin cream (MYCOSTATIN) Apply topically 2 (two) times daily. 05/13/20   [provider]  pantoprazole (PROTONIX) 40 MG tablet Take 1 tablet (40 mg total) by mouth at bedtime. 08/26/22   Loletha Grayer, MD  Probiotic Product ([place]) Take by mouth daily.    [provider]  RABEprazole (ACIPHEX) 20 MG tablet Take 1 tablet by mouth daily. 1 hour before meals 06/02/15   [provider]  triamcinolone cream (KENALOG) 0.1 % Apply topically 2 (two) times daily. 07/25/22   [provider]  VITAMIN D PO Take by mouth daily.    [provider]    Physical Exam: Vitals:   02/10/23 0524 02/10/23 0630 02/10/23 0637 02/10/23 0749  BP:   (!) 154/84   Pulse: 84     Resp: 18 19 20    Temp:      TempSrc:      SpO2: 100% 99% 99%   Weight:    74.8 kg   Physical Exam Constitutional:      General: He is not in acute distress.    Appearance: He is normal weight.     Comments: Patient is somnolent at the bedside  HENT:     Head: Normocephalic and atraumatic.     Mouth/Throat:     Mouth: Mucous membranes are moist.  Eyes:     Pupils: Pupils are equal, round, and reactive to light.  Cardiovascular:     Rate and Rhythm: Normal rate and regular rhythm.  Pulmonary:     Effort: Pulmonary effort is normal.   Abdominal:     General: Abdomen is flat. Bowel sounds are normal.  Musculoskeletal:        General: Normal range of motion.     Cervical back: Normal range of motion.     Comments: Positive mild lower extremity venous stasis changes  Skin:    General: Skin is warm.     Comments: Positive mild lower extremity venous stasis changes  Neurological:     Comments: Positive somnolence/lethargy at the bedside.  Otherwise grossly nonfocal neuroexam.  Psychiatric:        Mood and Affect: Mood normal.     Data Reviewed:  There are no new results to review at this time.  CT HEAD WO CONTRAST (5MM) CLINICAL DATA:  89 year old male with history of breakthrough seizure. Prior history of stroke.  EXAM: CT HEAD WITHOUT CONTRAST  TECHNIQUE: Contiguous axial images were obtained from the base of the skull through the vertex without intravenous contrast.  RADIATION DOSE REDUCTION: This exam was performed according to the departmental dose-optimization program which includes automated exposure control, adjustment of the mA and/or kV according to patient size and/or use of iterative reconstruction technique.  COMPARISON:  Head CT 06/18/2020.  FINDINGS: Brain: Moderate cerebral and mild cerebellar atrophy. Patchy and confluent areas of decreased attenuation are noted throughout the deep and periventricular white matter of the cerebral hemispheres bilaterally, compatible with chronic microvascular ischemic disease. No evidence of acute infarction, hemorrhage, hydrocephalus, extra-axial collection or mass lesion/mass effect.  Vascular: No hyperdense vessel or unexpected calcification.  Skull: Normal. Negative for fracture or focal lesion.  Sinuses/Orbits: No acute finding.  Other: None.  IMPRESSION: 1. No acute intracranial abnormalities. 2. Moderate cerebral and mild cerebellar atrophy with chronic microvascular ischemic changes in the cerebral white matter,  as above.  Electronically Signed   By: Vinnie Langton M.D.   On: 02/10/2023 05:44 DG Chest Portable 1 View CLINICAL DATA:  Seizure.  Poorly response  EXAM: PORTABLE CHEST 1 VIEW  COMPARISON:  12/20/2022  FINDINGS: Low volume chest with indistinct opacity at the bases. No edema, effusion, or pneumothorax. Normal heart size. Congested appearance of central vessels.  IMPRESSION: Low volume chest with atelectasis or pneumonia at the lung bases.  Electronically Signed   By: Jorje Guild M.D.   On: 02/10/2023 05:38  Lab Results  Component Value Date   WBC 8.9 02/10/2023   HGB 8.8 (L) 02/10/2023   HCT 27.4 (L) 02/10/2023   MCV 91.6 02/10/2023   PLT 189 [id number]   Last metabolic panel Lab Results  Component Value Date   GLUCOSE 107 (H) 02/10/2023   NA 137 02/10/2023   K 3.0 (L) 02/10/2023   CL 104 02/10/2023   CO2 20 (L) 02/10/2023   BUN 16 02/10/2023   CREATININE 0.96 02/10/2023   GFRNONAA >60 02/10/2023   CALCIUM 5.5 (LL) 02/10/2023   PHOS 3.0 08/25/2022   PROT 5.2 (L) 02/10/2023   ALBUMIN 2.6 (L) 02/10/2023   BILITOT 0.5 02/10/2023   ALKPHOS 49 02/10/2023   AST 21 02/10/2023   ALT 9 02/10/2023   ANIONGAP 13 02/10/2023    Assessment and Plan: * Electrolyte abnormality K3.0, calcium 5.5, magnesium of less than 0.5 Getting repletion in the ER Suspect likely confounding issue to breakthrough seizure Check ionized calcium, intact PTH and vitamin D level for hypocalcemia Trend repletion   Seizure-like activity (HCC) Positive breakthrough seizure at home despite regular home Keppra use per wife Was found on the floor in a stupor Usual pattern is generalized spasmodic/GTC behavior per the wife Status post Versed by EMS and route to the ER as well as IV keppra load on arrival  Suspect secondary to decreased p.o. intake and multiple electrolyte abnormalities CT head stable Will check Keppra level Repeat electrolytes Neuroconsult Prn ativan for  breakthrough seizure  Follow   Normocytic anemia Hemoglobin 8.2 today with baseline hemoglobin around 9-11. No active bleeding Will monitor for now Transfuse for hemoglobin less than 7 Consider Hemoccult in the setting of prior history of upper GI bleeding if hemoglobin continues to significantly downtrend Follow  Idiopathic thrombocytopenic purpura (HCC) Platelets 168 today Follow  Stroke ([place]) On baby asa    (HFpEF) heart failure with  preserved ejection fraction (HCC) Grade 1 diastolic function by 2D echo January 2021 Appears euvolemic Monitor volume status  Atrial fibrillation (HCC) EKG today with ?atrial fibrillation with a rate in the 70s Looks to be possible new diagnosis in  review of prior EKGs Will repeat EKG x 1 2D echo CHA2DS2-VASc score tentatively around 5 Likely not a anticoagulation candidate given recent issues with upper GI bleeding Formal cardiology consult in setting of elevated trop  Follow    Elevated troponin Troponin 40s on presentation No active chest pain EKG with noted new onset atrial fibrillation Suspect heart strain in the setting of seizure event Will trend troponin for now Follow  GERD (gastroesophageal reflux disease) PPI  CKD (chronic kidney disease), stage IIIa Cr 0.96 today Follow       Advance Care Planning:   Code Status: Full Code   Consults: Neurology w/ Dr. Quinn Axe. Will reach out to cardiology.   Family Communication: Wife at the bedside   Severity of Illness: The appropriate patient status for this patient is INPATIENT. Inpatient status is judged to be reasonable and necessary in order to provide the required intensity of service to ensure the patient's safety. The patient's presenting symptoms, physical exam findings, and initial radiographic and laboratory data in the context of their chronic comorbidities is felt to place them at high risk for further clinical deterioration. Furthermore, it is not anticipated  that the patient will be medically stable for discharge from the hospital within 2 midnights of admission.   * I certify that at the point of admission it is my clinical judgment that the patient will require inpatient hospital care spanning beyond 2 midnights from the point of admission due to high intensity of service, high risk for further deterioration and high frequency of surveillance required.*  Author: Deneise Lever, MD 02/10/2023 8:09 AM  For on call review [email or web address].

## 2023-02-10 NOTE — ED Notes (Signed)
MD Tamala Julian is speaking with wife dorothy who showed up. Pt taken to CT at this time.

## 2023-02-10 NOTE — Assessment & Plan Note (Signed)
K3.0, calcium 5.5, magnesium of less than 0.5 Getting repletion in the ER Suspect likely confounding issue to breakthrough seizure Check ionized calcium, intact PTH and vitamin D level for hypocalcemia Trend repletion

## 2023-02-10 NOTE — Assessment & Plan Note (Signed)
EKG today with ?atrial fibrillation with a rate in the 70s Looks to be possible new diagnosis in  review of prior EKGs Will repeat EKG x 1 2D echo CHA2DS2-VASc score tentatively around 5 Likely not a anticoagulation candidate given recent issues with upper GI bleeding Formal cardiology consult in setting of elevated trop  Follow

## 2023-02-10 NOTE — Assessment & Plan Note (Signed)
EKG today with atrial fibrillation with a rate in the 70s Looks to be new diagnosis and review of prior EKGs Will repeat EKG x 1 2D echo CHA2DS2-VASc score around 5 Likely not a anticoagulation candidate given recent issues with upper GI bleeding Cardiology consult as clinically indicated Follow

## 2023-02-10 NOTE — ED Notes (Signed)
ED Provider at bedside. 

## 2023-02-10 NOTE — Assessment & Plan Note (Addendum)
Positive breakthrough seizure at home despite regular home Keppra use per wife Was found on the floor in a stupor Usual pattern is generalized spasmodic/GTC behavior per the wife Status post Versed by EMS and route to the ER as well as IV keppra load on arrival  Suspect secondary to decreased p.o. intake and multiple electrolyte abnormalities CT head stable Will check Keppra level Repeat electrolytes Neuroconsult Follow

## 2023-02-10 NOTE — Consult Note (Signed)
Cardiology Consultation   Patient ID: [name] MRN: [id number]; DOB: [birth year]  Admit date: 02/10/2023 Date of Consult: 02/10/2023  PCP:  Lynnell Jude, MD   [facility] Cardiologist:  Kate Sable, MD        Patient Profile:   [name] is a 89 y.o. male with a hx of seizure, CVA who is being seen 02/10/2023 for the evaluation of elevated troponins at the request of Dr. Ernestina Patches.  History of Present Illness:   [name] CVA, hypertension, seizure who presents to the hospital after a seizure activity.  Patient remembers watching a ball game last evening going to bed at 4 AM.  Does not remember much.  Apparently his wife heard a loud talk according from patient falling.  She had difficulty waking him up, he was not responsive prompting her to call EMS.  Per wife, this usually occurs when he had prior seizures.  States being compliant with medications.  He was given Versed per EMS en route to the ED.  In the ED upon arrival, EKG showed sinus rhythm with PACs, troponins were 47, 196, 190.  He denies chest pain, denies shortness of breath.   Past Medical History:  Diagnosis Date   Allergic rhinitis    Arthropathy    B12 deficiency anemia    Chronic neck pain    CVA (cerebral infarction)    GERD (gastroesophageal reflux disease)    History of seizures    HTN (hypertension)    IDA (iron deficiency anemia)    ITP (idiopathic thrombocytopenic purpura) 07/16/2014   Seizures ([place])    Skin cancer of nose    Wears dentures    partial upper and lower    Past Surgical History:  Procedure Laterality Date   CHOLECYSTECTOMY  10/13/1988   COLONOSCOPY  10/2014, 08/2014   Dr. Gwenlyn Perking   ESOPHAGOGASTRODUODENOSCOPY (EGD) WITH PROPOFOL N/A 08/24/2022   Procedure: ESOPHAGOGASTRODUODENOSCOPY (EGD) WITH PROPOFOL;  Surgeon: Lin Landsman, MD;  Location: [facility];  Service: Gastroenterology;  Laterality: N/A;   MYRINGOTOMY WITH TUBE PLACEMENT  Bilateral 01/27/2021   Procedure: MYRINGOTOMY WITH TUBE PLACEMENT;  Surgeon: Margaretha Sheffield, MD;  Location: [facility];  Service: ENT;  Laterality: Bilateral;   UPPER GI ENDOSCOPY       Home Medications:  Prior to Admission medications   Medication Sig Start Date End Date Taking? Authorizing Provider  acetaminophen (TYLENOL) 325 MG tablet Take 650 mg by mouth every 6 (six) hours as needed for mild pain.   Yes [provider]  aspirin EC 81 MG tablet Take 1 tablet (81 mg total) by mouth daily. 08/26/22  Yes Wieting, Richard, MD  collagenase (SANTYL) 250 UNIT/GM ointment Apply 1 Application topically daily. 11/09/22  Yes Felipa Furnace, DPM  Cyanocobalamin (VITAMIN B12 PO) Take by mouth daily.   Yes [provider]  Docusate Sodium (STOOL SOFTENER LAXATIVE PO) Take by mouth.   Yes [provider]  EQ NASAL ALLERGY 55 MCG/ACT AERO nasal inhaler Place 2 sprays into the nose 2 (two) times daily. 02/06/23  Yes [provider]  ferrous sulfate 325 (65 FE) MG tablet Take 325 mg by mouth every other day as needed.   Yes [provider]  levETIRAcetam (KEPPRA) 500 MG tablet Take 1 tablet (500 mg total) by mouth 2 (two) times daily. Seizure prevention. 06/20/20 02/10/23 Yes Enzo Bi, MD  levothyroxine (SYNTHROID) 50 MCG tablet Take 50 mcg by mouth daily. 05/21/20  Yes [provider]  magnesium oxide (MAG-OX) 400 (240 Mg) MG tablet Take 1 tablet (400 mg total) by mouth daily. 08/26/22  Yes Wieting, Richard, MD  nystatin cream (MYCOSTATIN) Apply topically 2 (two) times daily. 05/13/20  Yes [provider]  pantoprazole (PROTONIX) 40 MG tablet Take 1 tablet (40 mg total) by mouth at bedtime. 08/26/22  Yes Loletha Grayer, MD  Probiotic Product ([place]) Take by mouth daily.   Yes [provider]  RABEprazole (ACIPHEX) 20 MG tablet Take 1 tablet by mouth daily. 1 hour before meals 06/02/15  Yes [provider]   triamcinolone cream (KENALOG) 0.1 % Apply topically 2 (two) times daily. 07/25/22  Yes [provider]  VITAMIN D PO Take by mouth daily.   Yes [provider]  acetaminophen-codeine (TYLENOL #3) 300-30 MG tablet Take 1-2 tablets by mouth every 4 (four) hours as needed for moderate pain. Patient not taking: Reported on 02/10/2023 11/09/22   Felipa Furnace, DPM  doxycycline (VIBRA-TABS) 100 MG tablet Take 1 tablet (100 mg total) by mouth 2 (two) times daily. Patient not taking: Reported on 02/10/2023 11/09/22   Felipa Furnace, DPM    Inpatient Medications: Scheduled Meds:  aspirin EC  81 mg Oral Daily   [START ON 02/11/2023] enoxaparin (LOVENOX) injection  40 mg Subcutaneous Q24H   Continuous Infusions:  PRN Meds: LORazepam  Allergies:    Allergies  Allergen Reactions   Meperidine Hives    Social History:   Social History   Socioeconomic History   Marital status: Married    Spouse name: Not on file   Number of children: Not on file   Years of education: Not on file   Highest education level: Not on file  Occupational History   Not on file  Tobacco Use   Smoking status: Never   Smokeless tobacco: Never  Vaping Use   Vaping Use: Never used  Substance and Sexual Activity   Alcohol use: No    Alcohol/week: 0.0 standard drinks of alcohol   Drug use: No   Sexual activity: Never  Other Topics Concern   Not on file  Social History Narrative   Not on file   Social Determinants of Health   Financial Resource Strain: Not on file  Food Insecurity: Not on file  Transportation Needs: Not on file  Physical Activity: Not on file  Stress: Not on file  Social Connections: Not on file  Intimate Partner Violence: Not on file    Family History:    Family History  Problem Relation Age of Onset   Heart disease Other    Cirrhosis Mother      ROS:  Please see the history of present illness.   All other ROS reviewed and negative.     Physical Exam/Data:    Vitals:   02/10/23 0749 02/10/23 0900 02/10/23 1200 02/10/23 1412  BP:  137/73 (!) 178/88 (!) 165/87  Pulse:  (!) 57 (!) 58 73  Resp:  15 14 16   Temp:  97.6 F (36.4 C)  98.6 F (37 C)  TempSrc:      SpO2:  100% 100% 100%  Weight: 74.8 kg       Intake/Output Summary (Last 24 hours) at 02/10/2023 1441 Last data filed at 02/10/2023 1107 Gross per 24 hour  Intake 1100 ml  Output 1200 ml  Net -100 ml      02/10/2023    7:49 AM 10/13/2022    3:07 PM 08/23/2022  10:05 AM  Last 3 Weights  Weight (lbs) 165 lb 165 lb 172 lb  Weight (kg) 74.844 kg 74.844 kg 78.019 kg     Body mass index is 24.37 kg/m.  General:  Well nourished, well developed, in no acute distress HEENT: normal Neck: no JVD Vascular: No carotid bruits; Distal pulses 2+ bilaterally Cardiac:  normal S1, S2; RRR; no murmur  Lungs:  clear to auscultation bilaterally, no wheezing, rhonchi or rales  Abd: soft, nontender, no hepatomegaly  Ext: 1+ edema Musculoskeletal:  No deformities, BUE and BLE strength normal and equal Skin: warm and dry  Neuro:  CNs 2-12 intact, no focal abnormalities noted Psych:  Normal affect   EKG:  The EKG was personally reviewed and demonstrates: Sinus rhythm, PACs Telemetry:  Telemetry was personally reviewed and demonstrates: Sinus rhythm  Relevant CV Studies: Echo 11/2019 EF 75%  Laboratory Data:  High Sensitivity Troponin:   Recent Labs  Lab 02/10/23 0516 02/10/23 0713 02/10/23 1215  TROPONINIHS 47* 196* 190*     Chemistry Recent Labs  Lab 02/10/23 0516 02/10/23 0927  NA 137 138  K 3.0* 3.6  CL 104 106  CO2 20* 22  GLUCOSE 107* 100*  BUN 16 14  CREATININE 0.96 0.94  CALCIUM 5.5* 5.9*  MG <0.5*  --   GFRNONAA >60 >60  ANIONGAP 13 10    Recent Labs  Lab 02/10/23 0516  PROT 5.2*  ALBUMIN 2.6*  AST 21  ALT 9  ALKPHOS 49  BILITOT 0.5   Lipids No results for input(s): "CHOL", "TRIG", "HDL", "LABVLDL", "LDLCALC", "CHOLHDL" in the last 168 hours.   Hematology Recent Labs  Lab 02/10/23 0516 02/10/23 0743  WBC 7.2 8.9  RBC 2.77* 2.99*  HGB 8.2* 8.8*  HCT 25.7* 27.4*  MCV 92.8 91.6  MCH 29.6 29.4  MCHC 31.9 32.1  RDW 14.7 14.8  PLT 168 189   Thyroid No results for input(s): "TSH", "FREET4" in the last 168 hours.  BNPNo results for input(s): "BNP", "PROBNP" in the last 168 hours.  DDimer No results for input(s): "DDIMER" in the last 168 hours.   Radiology/Studies:  CT HEAD WO CONTRAST (5MM)  Result Date: 02/10/2023 CLINICAL DATA:  89 year old male with history of breakthrough seizure. Prior history of stroke. EXAM: CT HEAD WITHOUT CONTRAST TECHNIQUE: Contiguous axial images were obtained from the base of the skull through the vertex without intravenous contrast. RADIATION DOSE REDUCTION: This exam was performed according to the departmental dose-optimization program which includes automated exposure control, adjustment of the mA and/or kV according to patient size and/or use of iterative reconstruction technique. COMPARISON:  Head CT 06/18/2020. FINDINGS: Brain: Moderate cerebral and mild cerebellar atrophy. Patchy and confluent areas of decreased attenuation are noted throughout the deep and periventricular white matter of the cerebral hemispheres bilaterally, compatible with chronic microvascular ischemic disease. No evidence of acute infarction, hemorrhage, hydrocephalus, extra-axial collection or mass lesion/mass effect. Vascular: No hyperdense vessel or unexpected calcification. Skull: Normal. Negative for fracture or focal lesion. Sinuses/Orbits: No acute finding. Other: None. IMPRESSION: 1. No acute intracranial abnormalities. 2. Moderate cerebral and mild cerebellar atrophy with chronic microvascular ischemic changes in the cerebral white matter, as above. Electronically Signed   By: Vinnie Langton M.D.   On: 02/10/2023 05:44   DG Chest Portable 1 View  Result Date: 02/10/2023 CLINICAL DATA:  Seizure.  Poorly response EXAM:  PORTABLE CHEST 1 VIEW COMPARISON:  12/20/2022 FINDINGS: Low volume chest with indistinct opacity at the bases. No edema, effusion, or  pneumothorax. Normal heart size. Congested appearance of central vessels. IMPRESSION: Low volume chest with atelectasis or pneumonia at the lung bases. Electronically Signed   By: Jorje Guild M.D.   On: 02/10/2023 05:38     Assessment and Plan:   Elevated troponins -Consistent with demand supply mismatch in the context of seizure activity -Previous echo with normal EF -No indication for additional testing -Management of seizure activity as per primary team  2.  Seizure activity -Management as per medicine team   No additional cardiac testing or intervention planned during this admission.  Cardiology will sign off.   Signed, Kate Sable, MD  02/10/2023 2:41 PM

## 2023-02-10 NOTE — Assessment & Plan Note (Signed)
Cr 0.96 today Follow

## 2023-02-10 NOTE — Progress Notes (Signed)
Patient arrived via ems. Noted to be on nonrebreather mask. Per ems-patient post seizure. Rt services noted need at this time.

## 2023-02-10 NOTE — ED Notes (Signed)
ED Provider at bedside rolling pt over to assess the open wounds to his buttocks.

## 2023-02-10 NOTE — Progress Notes (Signed)
Pt admitted to floor. Alert. No complaints. No signs distress.  Introduced self.Explained plan of care. Questions addressed.  Oriented pt to floor and call bell. Explained will return for full head to toe assessment.

## 2023-02-10 NOTE — Assessment & Plan Note (Signed)
Troponin 40s on presentation No active chest pain EKG with noted new onset atrial fibrillation Suspect heart strain in the setting of seizure event Will trend troponin for now Follow

## 2023-02-10 NOTE — Assessment & Plan Note (Signed)
PPI ?

## 2023-02-10 NOTE — ED Triage Notes (Signed)
Per EMS pt was called out for a seizure, pt has hx of them. Initial ems responding crew stated pt was cyanotic and postictal and face down. Pt was brought from home. EMS sts that pt became agitated and was pulling at lines and they administered 5mg  versed iv. Pt is currently responsive to speech although not oriented.

## 2023-02-10 NOTE — Assessment & Plan Note (Signed)
Platelets 168 today Follow

## 2023-02-10 NOTE — Assessment & Plan Note (Signed)
On baby asa

## 2023-02-10 NOTE — ED Provider Notes (Signed)
[facility] Provider Note    Event Date/Time   First MD Initiated Contact with Patient 02/10/23 [phone]     (approximate)   History   Seizures   HPI  [name] is a 89 y.o. male who presents to the ED for evaluation of Seizures   Review of neurology clinic visit 11/21.  Seizure disorder on Keppra with a history of stroke.  [facility] medical admission 1/16 for sepsis from pneumonia and cellulitis.  Patient presents from home via EMS for evaluation of seizure and altered mentation.  EMS reports being called out for seizure activity and finding the patient in a prone position at home, confused and cyanotic.  He became agitated on route to the hospital and EMS also provided 5 mg of Versed IV.  Here in the ED he is poorly responsive and unable to provide any relevant history.  His wife arrives later and provide some supplemental history that he has been doing fine.  No known missed doses or recent illnesses.    Physical Exam   Triage Vital Signs: ED Triage Vitals  Enc Vitals Group     BP      Pulse      Resp      Temp      Temp src      SpO2      Weight      Height      Head Circumference      Peak Flow      Pain Score      Pain Loc      Pain Edu?      Excl. in [place]?     Most recent vital signs: Vitals:   02/10/23 0517 02/10/23 0524  BP:    Pulse:  84  Resp:  18  Temp: (!) 96.4 F (35.8 C)   SpO2:  100%    General: Somnolent without distress.  Will localize with pain and opens eyes to loud vocal stimulation.  No apparent deficits or laterality CV:  Good peripheral perfusion.  Resp:  Normal effort.  Abd:  No distention.  MSK:  No deformity noted.  Neuro:  No focal deficits appreciated. Other:     ED Results / Procedures / Treatments   Labs (all labs ordered are listed, but only abnormal results are displayed) Labs Reviewed  URINALYSIS, ROUTINE W REFLEX MICROSCOPIC - Abnormal; Notable for the following components:      Result Value    Color, Urine YELLOW (*)    APPearance HAZY (*)    Hgb urine dipstick SMALL (*)    Leukocytes,Ua SMALL (*)    Bacteria, UA RARE (*)    All other components within normal limits  MAGNESIUM - Abnormal; Notable for the following components:   Magnesium <0.5 (*)    All other components within normal limits  COMPREHENSIVE METABOLIC PANEL - Abnormal; Notable for the following components:   Potassium 3.0 (*)    CO2 20 (*)    Glucose, Bld 107 (*)    Calcium 5.5 (*)    Total Protein 5.2 (*)    Albumin 2.6 (*)    All other components within normal limits  CBC WITH DIFFERENTIAL/PLATELET - Abnormal; Notable for the following components:   RBC 2.77 (*)    Hemoglobin 8.2 (*)    HCT 25.7 (*)    All other components within normal limits  LACTIC ACID, PLASMA - Abnormal; Notable for the following components:   Lactic Acid, Venous  3.0 (*)    All other components within normal limits  TROPONIN I (HIGH SENSITIVITY) - Abnormal; Notable for the following components:   Troponin I (High Sensitivity) 47 (*)    All other components within normal limits  CULTURE, BLOOD (ROUTINE X 2)  CULTURE, BLOOD (ROUTINE X 2)  LACTIC ACID, PLASMA    EKG Sinus rhythm with sinus arrhythmia with a rate of 73 bpm.  Rightward axis.  Prolonged QTc.  No STEMI.  RADIOLOGY CT head interpreted by me without evidence of acute intracranial pathology CXR interpreted by me with atelectasis versus infiltrate of the left base  Official radiology report(s): CT HEAD WO CONTRAST (5MM)  Result Date: 02/10/2023 CLINICAL DATA:  89 year old male with history of breakthrough seizure. Prior history of stroke. EXAM: CT HEAD WITHOUT CONTRAST TECHNIQUE: Contiguous axial images were obtained from the base of the skull through the vertex without intravenous contrast. RADIATION DOSE REDUCTION: This exam was performed according to the departmental dose-optimization program which includes automated exposure control, adjustment of the mA and/or  kV according to patient size and/or use of iterative reconstruction technique. COMPARISON:  Head CT 06/18/2020. FINDINGS: Brain: Moderate cerebral and mild cerebellar atrophy. Patchy and confluent areas of decreased attenuation are noted throughout the deep and periventricular white matter of the cerebral hemispheres bilaterally, compatible with chronic microvascular ischemic disease. No evidence of acute infarction, hemorrhage, hydrocephalus, extra-axial collection or mass lesion/mass effect. Vascular: No hyperdense vessel or unexpected calcification. Skull: Normal. Negative for fracture or focal lesion. Sinuses/Orbits: No acute finding. Other: None. IMPRESSION: 1. No acute intracranial abnormalities. 2. Moderate cerebral and mild cerebellar atrophy with chronic microvascular ischemic changes in the cerebral white matter, as above. Electronically Signed   By: Vinnie Langton M.D.   On: 02/10/2023 05:44   DG Chest Portable 1 View  Result Date: 02/10/2023 CLINICAL DATA:  Seizure.  Poorly response EXAM: PORTABLE CHEST 1 VIEW COMPARISON:  12/20/2022 FINDINGS: Low volume chest with indistinct opacity at the bases. No edema, effusion, or pneumothorax. Normal heart size. Congested appearance of central vessels. IMPRESSION: Low volume chest with atelectasis or pneumonia at the lung bases. Electronically Signed   By: Jorje Guild M.D.   On: 02/10/2023 05:38    PROCEDURES and INTERVENTIONS:  .1-3 Lead EKG Interpretation  Performed by: Vladimir Crofts, MD Authorized by: Vladimir Crofts, MD     Interpretation: normal     ECG rate:  80   ECG rate assessment: normal     Rhythm: sinus rhythm     Ectopy: none     Conduction: normal     Medications  levETIRAcetam (KEPPRA) IVPB 1500 mg/ 100 mL premix (has no administration in time range)  potassium chloride 10 mEq in 100 mL IVPB (10 mEq Intravenous New Bag/Given 02/10/23 0617)  magnesium sulfate IVPB 4 g 100 mL (has no administration in time range)  lactated  ringers bolus 1,000 mL (1,000 mLs Intravenous New Bag/Given 02/10/23 0547)     IMPRESSION / MDM / [place] / ED COURSE  I reviewed the triage vital signs and the nursing notes.  Differential diagnosis includes, but is not limited to, medication noncompliance, sepsis dehydration, AKI  {Patient presents with symptoms of an acute illness or injury that is potentially life-threatening.  89 year old male with seizure disorder presents with resolving postictal state and found to have multiple electrolyte derangements requiring medical observation admission.  No signs of status epilepticus.  No significant signs of trauma.  CT head is clear.  Lactic acid is  somewhat elevated, likely appropriate in the setting of his reported seizure activity at home.  Blood work also with hypokalemia, hypocalcemia and hypomagnesemia that could be contributing to his seizure.  We will initiate replacement of his electrolytes and consult medicine for admission.  Clinical Course as of 02/10/23 [id number]  Sat Feb 10, 2023  0530 I get supplemental info from his Wife [DS]  [phone] Reassessed.  More awake [DS]    Clinical Course User Index [DS] Vladimir Crofts, MD     FINAL CLINICAL IMPRESSION(S) / ED DIAGNOSES   Final diagnoses:  Seizure-like activity ([place])  Hypocalcemia  Hypomagnesemia  Hypokalemia     Rx / DC Orders   ED Discharge Orders     None        Note:  This document was prepared using Dragon voice recognition software and may include unintentional dictation errors.   Vladimir Crofts, MD 02/10/23 [phone]

## 2023-02-11 ENCOUNTER — Inpatient Hospital Stay (HOSPITAL_COMMUNITY)
Admit: 2023-02-11 | Discharge: 2023-02-11 | Disposition: A | Discharge status: Home / Self Care | Payer: PPO | Attending: Family Medicine | Admitting: Family Medicine

## 2023-02-11 ENCOUNTER — Inpatient Hospital Stay: Payer: PPO

## 2023-02-11 DIAGNOSIS — E878 Other disorders of electrolyte and fluid balance, not elsewhere classified: Secondary | ICD-10-CM | POA: Diagnosis not present

## 2023-02-11 DIAGNOSIS — D693 Immune thrombocytopenic purpura: Secondary | ICD-10-CM | POA: Diagnosis not present

## 2023-02-11 DIAGNOSIS — G40919 Epilepsy, unspecified, intractable, without status epilepticus: Secondary | ICD-10-CM | POA: Diagnosis not present

## 2023-02-11 DIAGNOSIS — D649 Anemia, unspecified: Secondary | ICD-10-CM | POA: Diagnosis not present

## 2023-02-11 DIAGNOSIS — R569 Unspecified convulsions: Secondary | ICD-10-CM | POA: Diagnosis not present

## 2023-02-11 DIAGNOSIS — I4891 Unspecified atrial fibrillation: Secondary | ICD-10-CM

## 2023-02-11 LAB — BASIC METABOLIC PANEL
Anion gap: 8 (ref 5–15)
Anion gap: 9 (ref 5–15)
Anion gap: 8 (ref 5–15)
BUN: 11 mg/dL (ref 8–23)
BUN: 13 mg/dL (ref 8–23)
BUN: 14 mg/dL (ref 8–23)
CO2: 24 mmol/L (ref 22–32)
CO2: 23 mmol/L (ref 22–32)
CO2: 23 mmol/L (ref 22–32)
Calcium: 7 mg/dL — ABNORMAL LOW (ref 8.9–10.3)
Calcium: 7.1 mg/dL — ABNORMAL LOW (ref 8.9–10.3)
Calcium: 6.9 mg/dL — ABNORMAL LOW (ref 8.9–10.3)
Chloride: 109 mmol/L (ref 98–111)
Chloride: 106 mmol/L (ref 98–111)
Chloride: 102 mmol/L (ref 98–111)
Creatinine, Ser: 0.85 mg/dL (ref 0.61–1.24)
Creatinine, Ser: 0.94 mg/dL (ref 0.61–1.24)
Creatinine, Ser: 0.96 mg/dL (ref 0.61–1.24)
GFR, Estimated: >60 mL/min (ref >60)
GFR, Estimated: >60 mL/min (ref >60)
GFR, Estimated: >60 mL/min (ref >60)
Glucose, Bld: 76 mg/dL (ref 70–99)
Glucose, Bld: 103 mg/dL — ABNORMAL HIGH (ref 70–99)
Glucose, Bld: 121 mg/dL — ABNORMAL HIGH (ref 70–99)
Potassium: 3.6 mmol/L (ref 3.5–5.1)
Potassium: 4.1 mmol/L (ref 3.5–5.1)
Potassium: 4 mmol/L (ref 3.5–5.1)
Sodium: 141 mmol/L (ref 135–145)
Sodium: 138 mmol/L (ref 135–145)
Sodium: 133 mmol/L — ABNORMAL LOW (ref 135–145)

## 2023-02-11 LAB — ECHOCARDIOGRAM COMPLETE
AR max vel: 1.19 cm2
AV Peak grad: 15.1 mmHg
Ao pk vel: 1.95 m/s
Area-P 1/2: 3.79 cm2
Calc EF: 59.6 %
Height: 72 in
S' Lateral: 3.1 cm
Single Plane A2C EF: 58.8 %
Single Plane A4C EF: 61.7 %
Weight: 2546.75 oz

## 2023-02-11 LAB — CBC
HCT: 27.2 % — ABNORMAL LOW (ref 39.0–52.0)
Hemoglobin: 9.1 g/dL — ABNORMAL LOW (ref 13.0–17.0)
MCH: 30.1 pg (ref 26.0–34.0)
MCHC: 33.5 g/dL (ref 30.0–36.0)
MCV: 90.1 fL (ref 80.0–100.0)
Platelets: 185 10*3/uL (ref 150–400)
RBC: 3.02 MIL/uL — ABNORMAL LOW (ref 4.22–5.81)
RDW: 14.8 % (ref 11.5–15.5)
WBC: 7.7 10*3/uL (ref 4.0–10.5)
nRBC: 0 % (ref 0.0–0.2)

## 2023-02-11 MED ORDER — PERFLUTREN LIPID MICROSPHERE
1.0000 mL | INTRAVENOUS | Status: AC | PRN
  Administered 2023-02-11: 3 mL via INTRAVENOUS

## 2023-02-11 MED ORDER — MAGNESIUM SULFATE 4 GM/100ML IV SOLN
4.0000 g | Freq: Once | INTRAVENOUS | Status: AC
  Administered 2023-02-11: 4 g via INTRAVENOUS
  Filled 2023-02-11: qty 100

## 2023-02-11 NOTE — Progress Notes (Signed)
Progress Note   Patient: [name] [id number] DOB: [birth year] DOA: 02/10/2023     1 DOS: the patient was seen and examined on 02/11/2023   Brief hospital course:  OBRIEN TRILLING is a 89 y.o. male with medical history significant of seizure disorder, CVA, GERD, hypertension, ITP, anemia, history of upper GI bleed presenting with seizure, electrolyte abnormality, atrial fibrillation, elevated troponin.  History primarily from patient's wife as patient is currently lethargic in the postictal state.  Per report, wife heard a loud grunt from patient in the middle of the night and then a loud thud with patient falling on the floor.  Patient was subsequently found down and lethargic.  EMS subsequently called.  Per the wife, this is usual pattern for patient's prior seizures.  Has been placed on Keppra for management.  Wife reports compliance with regimen with no missed doses.  No recent infections.  No nausea vomiting or abdominal pain.  No chest pain or shortness of breath.  Positive decreased p.o. intake over several weeks.  Has been otherwise stable up until this event.  Patient given 5 mg IM Versed upon arrival by EMS. Presented to the ER afebrile, hemodynamically stable.  Initially placed on nonrebreather now transition to 3 L of nasal cannula.  White count 7.2, hemoglobin 8.2, platelet count 168, urinalysis minimally indicative of infection.  Lactate 1.2, troponin 47, magnesium of less than 0.5.  Potassium of 3.0, calcium of 5.5.  CT head grossly stable.  Given IV Keppra load in the ER.  EKG with atrial fibrillation with heart rate in the 70s.   Assessment and Plan:  Electrolyte abnormality - repleted   K3.0, calcium 5.5, magnesium of less than 0.5 Getting repletion in the ER Suspect likely confounding issue to breakthrough seizure Check ionized calcium, intact PTH and vitamin D level for hypocalcemia Trend repletion  Seizure-like activity   Positive breakthrough seizure at home despite  regular home Keppra use per wife Was found on the floor in a stupor Usual pattern is generalized spasmodic/GTC behavior per the wife Status post Versed by EMS and route to the ER as well as IV keppra load on arrival  Suspect secondary to decreased p.o. intake and multiple electrolyte abnormalities CT head stable Will check Keppra level-Keppra dose increased to 750 mg twice daily from primary milligrams twice daily. Repeat electrolytes Neuroconsult Prn ativan for breakthrough seizure  Follow  Normocytic anemia  Hemoglobin 8.2 today with baseline hemoglobin around 9-11. No active bleeding Will monitor for now Transfuse for hemoglobin less than 7 Consider Hemoccult in the setting of prior history of upper GI bleeding if hemoglobin continues to significantly downtrend Follow  Idiopathic thrombocytopenic purpura  Platelets 168 today Follow  Stroke  On baby asa   (HFpEF) heart failure with preserved ejection fraction  Grade 1 diastolic function by 2D echo January 2021 Appears euvolemic Monitor volume status  Atrial fibrillation (HCC) EKG today with ?atrial fibrillation with a rate in the 70s Looks to be possible new diagnosis in  review of prior EKGs Will repeat EKG x 1 2D echo CHA2DS2-VASc score tentatively around 5 Likely not a anticoagulation candidate given recent issues with upper GI bleeding Formal cardiology consult in setting of elevated trop  Cardiology recommendations appreciated.  No complaints of anticoagulation.  Patient to follow with cardiology in outpatient setting.  Elevated troponin Troponin 40s on presentation No active chest pain EKG with noted new onset atrial fibrillation-now in sinus Suspect heart strain in the setting of seizure event  Will trend troponin for now Follow  GERD (gastroesophageal reflux disease) PPI  CKD (chronic kidney disease), stage IIIa Cr 0.96 today Follow      Subjective: Patient seen and examined this morning.  No  overnight events.  Seizure-free.  Vital labs and imaging reviewed.  MRI with no acute finding.  Physical Exam: Vitals:   02/11/23 0030 02/11/23 0517 02/11/23 0836 02/11/23 1255  BP: 134/75 135/88 107/69 116/86  Pulse: 71 74 80 79  Resp: 16 16 18 18   Temp: 99.4 F (37.4 C) 98.7 F (37.1 C) 98.7 F (37.1 C)   TempSrc:      SpO2: 98% 100% 98% 97%  Weight:      Height:       Physical Exam Constitutional:      Appearance: Normal appearance.  HENT:     Head: Normocephalic and atraumatic.     Nose: Nose normal.     Mouth/Throat:     Mouth: Mucous membranes are moist.  Eyes:     Extraocular Movements: Extraocular movements intact.     Pupils: Pupils are equal, round, and reactive to light.  Cardiovascular:     Rate and Rhythm: Normal rate and regular rhythm.  Pulmonary:     Effort: Pulmonary effort is normal.     Breath sounds: Normal breath sounds.  Abdominal:     General: Abdomen is flat.     Palpations: Abdomen is soft.  Musculoskeletal:        General: Normal range of motion.     Cervical back: Normal range of motion.  Skin:    General: Skin is warm.  Neurological:     General: No focal deficit present.     Mental Status: He is alert.  Psychiatric:        Mood and Affect: Mood normal.        Behavior: Behavior normal.     Data Reviewed:  There are no new results to review at this time.  Family Communication: None by bedside  Disposition: Status is: Inpatient Remains inpatient appropriate because: Breakthrough seizures.  Planned Discharge Destination: Home    Time spent: 35 minutes  Author: Oran Rein, MD 02/11/2023 1:44 PM  For on call review [email or web address].

## 2023-02-11 NOTE — Progress Notes (Signed)
  Echocardiogram 2D Echocardiogram has been performed. Definity IV ultrasound imaging agent used on this study.  [name] 02/11/2023, 12:49 PM

## 2023-02-11 NOTE — Consult Note (Addendum)
NEUROLOGY CONSULTATION NOTE   Date of service: February 11, 2023 Patient Name: [name] MRN:  [id number] DOB:  [birth year] Reason for consult: breakthrough seizure Requesting physician: Dr. Shanda Howells _ _ _   _ __   _ __ _ _  __ __   _ __   __ _  History of Present Illness   This is a 89 yo man with hx CVA, seizure disorder, HTN, a fib who presented with breakthrough seizure. Wife heard him fall on the floor from the other room and he was found down and lethargic. This is how his seizures have presented in the past. He is on keppra 500mg  bid and has not missed any doses. CT head showed no acute process. His lethargy resolved and he is currently back to baseline.   ROS   Per HPI: all other systems reviewed and are negative  Past History   I have reviewed the following:  Past Medical History:  Diagnosis Date   Allergic rhinitis    Arthropathy    B12 deficiency anemia    Chronic neck pain    CVA (cerebral infarction)    GERD (gastroesophageal reflux disease)    History of seizures    HTN (hypertension)    IDA (iron deficiency anemia)    ITP (idiopathic thrombocytopenic purpura) 07/16/2014   Seizures ([place])    Skin cancer of nose    Wears dentures    partial upper and lower   Past Surgical History:  Procedure Laterality Date   CHOLECYSTECTOMY  10/13/1988   COLONOSCOPY  10/2014, 08/2014   Dr. Gwenlyn Perking   ESOPHAGOGASTRODUODENOSCOPY (EGD) WITH PROPOFOL N/A 08/24/2022   Procedure: ESOPHAGOGASTRODUODENOSCOPY (EGD) WITH PROPOFOL;  Surgeon: Lin Landsman, MD;  Location: [place];  Service: Gastroenterology;  Laterality: N/A;   MYRINGOTOMY WITH TUBE PLACEMENT Bilateral 01/27/2021   Procedure: MYRINGOTOMY WITH TUBE PLACEMENT;  Surgeon: Margaretha Sheffield, MD;  Location: [facility];  Service: ENT;  Laterality: Bilateral;   UPPER GI ENDOSCOPY     Family History  Problem Relation Age of Onset   Heart disease Other    Cirrhosis Mother    Social History    Socioeconomic History   Marital status: Married    Spouse name: Not on file   Number of children: Not on file   Years of education: Not on file   Highest education level: Not on file  Occupational History   Not on file  Tobacco Use   Smoking status: Never   Smokeless tobacco: Never  Vaping Use   Vaping Use: Never used  Substance and Sexual Activity   Alcohol use: No    Alcohol/week: 0.0 standard drinks of alcohol   Drug use: No   Sexual activity: Never  Other Topics Concern   Not on file  Social History Narrative   Not on file   Social Determinants of Health   Financial Resource Strain: Not on file  Food Insecurity: No Food Insecurity (02/10/2023)   Hunger Vital Sign    Worried About Running Out of Food in the Last Year: Never true    Ran Out of Food in the Last Year: Never true  Transportation Needs: No Transportation Needs (02/10/2023)   PRAPARE - Hydrologist (Medical): No    Lack of Transportation (Non-Medical): No  Physical Activity: Not on file  Stress: Not on file  Social Connections: Not on file   Allergies  Allergen Reactions   Meperidine Hives  Medications   Medications Prior to Admission  Medication Sig Dispense Refill Last Dose   acetaminophen (TYLENOL) 325 MG tablet Take 650 mg by mouth every 6 (six) hours as needed for mild pain.   unknown   aspirin EC 81 MG tablet Take 1 tablet (81 mg total) by mouth daily. 30 tablet 11 02/09/2023   collagenase (SANTYL) 250 UNIT/GM ointment Apply 1 Application topically daily. 15 g 0 unknown   Cyanocobalamin (VITAMIN B12 PO) Take by mouth daily.   02/09/2023   Docusate Sodium (STOOL SOFTENER LAXATIVE PO) Take by mouth.   02/09/2023   EQ NASAL ALLERGY 55 MCG/ACT AERO nasal inhaler Place 2 sprays into the nose 2 (two) times daily.   unknown   ferrous sulfate 325 (65 FE) MG tablet Take 325 mg by mouth every other day as needed.   Past Week   levETIRAcetam (KEPPRA) 500 MG tablet Take 1  tablet (500 mg total) by mouth 2 (two) times daily. Seizure prevention. 60 tablet 2 02/09/2023   levothyroxine (SYNTHROID) 50 MCG tablet Take 50 mcg by mouth daily.   02/09/2023   magnesium oxide (MAG-OX) 400 (240 Mg) MG tablet Take 1 tablet (400 mg total) by mouth daily. 30 tablet 0 02/09/2023   nystatin cream (MYCOSTATIN) Apply topically 2 (two) times daily.   02/09/2023   pantoprazole (PROTONIX) 40 MG tablet Take 1 tablet (40 mg total) by mouth at bedtime. 30 tablet 0 02/09/2023   Probiotic Product ([place]) Take by mouth daily.   02/09/2023   RABEprazole (ACIPHEX) 20 MG tablet Take 1 tablet by mouth daily. 1 hour before meals   02/09/2023   triamcinolone cream (KENALOG) 0.1 % Apply topically 2 (two) times daily.   unknown   VITAMIN D PO Take by mouth daily.   02/09/2023   acetaminophen-codeine (TYLENOL #3) 300-30 MG tablet Take 1-2 tablets by mouth every 4 (four) hours as needed for moderate pain. (Patient not taking: Reported on 02/10/2023) 30 tablet 0 Not Taking   doxycycline (VIBRA-TABS) 100 MG tablet Take 1 tablet (100 mg total) by mouth 2 (two) times daily. (Patient not taking: Reported on 02/10/2023) 28 tablet 0 Not Taking      Current Facility-Administered Medications:    aspirin EC tablet 81 mg, 81 mg, Oral, Daily, Deneise Lever, MD, 81 mg at 02/10/23 1043   calcium carbonate (TUMS - dosed in mg elemental calcium) chewable tablet 800 mg of elemental calcium, 800 mg of elemental calcium, Oral, BID, Deneise Lever, MD, 800 mg of elemental calcium at 02/10/23 1629   enoxaparin (LOVENOX) injection 40 mg, 40 mg, Subcutaneous, Q24H, Deneise Lever, MD   Gerhardt's butt cream, , Topical, BID, Deneise Lever, MD, Given at 02/10/23 2240   levETIRAcetam (KEPPRA) tablet 750 mg, 750 mg, Oral, BID, Derek Jack, MD, 750 mg at 02/10/23 1629   LORazepam (ATIVAN) injection 2 mg, 2 mg, Intravenous, PRN, Athena Masse, MD  Vitals   Vitals:   02/10/23 1614 02/10/23 2013  02/11/23 0030 02/11/23 0517  BP:  126/79 134/75 135/88  Pulse:  77 71 74  Resp:  18 16 16   Temp:  97.9 F (36.6 C) 99.4 F (37.4 C) 98.7 F (37.1 C)  TempSrc:      SpO2:  96% 98% 100%  Weight: 72.2 kg     Height: 6' (1.829 m)        Body mass index is 21.59 kg/m.  Physical Exam   Physical Exam Gen: A&O  x4, NAD HEENT: Atraumatic, normocephalic;mucous membranes moist; oropharynx clear, tongue without atrophy or fasciculations. Neck: Supple, trachea midline. Resp: CTAB, no w/r/r CV: RRR, no m/g/r; nml S1 and S2. 2+ symmetric peripheral pulses. Abd: soft/NT/ND; nabs x 4 quad Extrem: Nml bulk; no cyanosis, clubbing, or edema.  Neuro: *MS: A&O x4. Follows multi-step commands.  *Speech: fluid, nondysarthric, able to name and repeat *CN:    I: Deferred   II,III: PERRLA, VFF by confrontation, optic discs unable to be visualized 2/2 pupillary constriction   III,IV,VI: EOMI w/o nystagmus, no ptosis   V: Sensation intact from V1 to V3 to LT   VII: Eyelid closure was full.  Smile symmetric.   VIII: Hearing intact to voice   IX,X: Voice normal, palate elevates symmetrically    XI: SCM/trap 5/5 bilat   XII: Tongue protrudes midline, no atrophy or fasciculations   *Motor:   Normal bulk.  No tremor, rigidity or bradykinesia. No drift in BUE, anti-gravity in BLE baseline 2/2 lymphedema *Sensory: Intact to light touch, pinprick, temperature vibration throughout. Symmetric. Propioception intact bilat.  No double-simultaneous extinction.  *Coordination:  Finger-to-nose, heel-to-shin, rapid alternating motions were intact. *Reflexes:  2+ and symmetric throughout without clonus; toes down-going bilat *Gait: deferred    Labs   CBC:  Recent Labs  Lab 02/10/23 0516 02/10/23 0743  WBC 7.2 8.9  NEUTROABS 5.2  --   HGB 8.2* 8.8*  HCT 25.7* 27.4*  MCV 92.8 91.6  PLT 168 [id number]    Basic Metabolic Panel:  Lab Results  Component Value Date   NA 140 02/10/2023   K 3.8 02/10/2023    CO2 23 02/10/2023   GLUCOSE 96 02/10/2023   BUN 12 02/10/2023   CREATININE 0.91 02/10/2023   CALCIUM 6.8 (L) 02/10/2023   GFRNONAA >60 02/10/2023   GFRAA >60 06/20/2020   Lipid Panel:  Lab Results  Component Value Date   LDLCALC 70 12/01/2019   HgbA1c:  Lab Results  Component Value Date   HGBA1C 5.9 (H) 12/01/2019   Urine Drug Screen:     Component Value Date/Time   LABOPIA NONE DETECTED 11/30/2019 0449   COCAINSCRNUR NONE DETECTED 11/30/2019 0449   LABBENZ NONE DETECTED 11/30/2019 0449   AMPHETMU NONE DETECTED 11/30/2019 0449   THCU NONE DETECTED 11/30/2019 0449   LABBARB NONE DETECTED 11/30/2019 0449    Alcohol Level No results found for: "ETH"    Impression   This is a 89 yo man with hx CVA, seizure disorder, HTN, a fib who presented with breakthrough seizure. Given that he has hx prior CVA as well as a fib, recommend MRI brain wo contrast r/o new ischemic event. In the meantime given his compliance with keppra will increase dose to 750mg  bid.  Recommendations   - MRI brain wo contrast - Increase keppra to 500mg  bid  Neurology will f/u on MRI brain and will otherwise be available prn for questions going forward. ______________________________________________________________________   Thank you for the opportunity to take part in the care of this patient. If you have any further questions, please contact the neurology consultation attending.  Signed,  Su Monks, MD [facility] [phone]  If 7pm- 7am, please page neurology on call as listed in [place].  **Any copied and pasted documentation in this note was written by me in another application not billed for and pasted by me into this document.

## 2023-02-11 NOTE — Consult Note (Addendum)
WOC Nurse Consult Note: Reason for Consult:LLE wound Wound type:chronic venous insufficiency with trauma from fall Pressure Injury POA: Stage 1 to buttocks noted upon admission Measurement:Bedside RN to measure LLE wound and document measurements on Nursing flow sheet with application of next dressing today Dressing procedure/placement/frequency:Turning and repositioning is in place, guidance is added to minimize time in the supine position. Timely incontinence care using our house skin care products is recommended as is the placement of a prophylactic sacral silicone foam.   Bilateral pressure redistribution heel boots are provided for PI prevention along with topical care guidance for the LLE. Nursing is to cleanse and dry daily, and follow with covering the lesion with folded layers of xeroform gauze topped with dry gauze and secured with a few turns of Kerlix roll gauze/paper tape.   [facility] nursing team will not follow, but will remain available to this patient, the nursing and medical teams.  Please re-consult if needed.  Thank you for inviting [place] to participate in this patient's Plan of Care.  Maudie Flakes, MSN, RN, CNS, [place], Serita Grammes, [employer], [employer] phone:  [phone]

## 2023-02-11 NOTE — Plan of Care (Signed)
  Problem: Education: Goal: Knowledge of General Education information will improve Description: Including pain rating scale, medication(s)/side effects and non-pharmacologic comfort measures Outcome: Progressing   Problem: Clinical Measurements: Goal: Ability to maintain clinical measurements within normal limits will improve Outcome: Progressing Goal: Diagnostic test results will improve Outcome: Progressing Goal: Respiratory complications will improve Outcome: Progressing Goal: Cardiovascular complication will be avoided Outcome: Progressing   Problem: Activity: Goal: Risk for activity intolerance will decrease Outcome: Progressing   Problem: Coping: Goal: Level of anxiety will decrease Outcome: Progressing   Problem: Elimination: Goal: Will not experience complications related to bowel motility Outcome: Progressing Goal: Will not experience complications related to urinary retention Outcome: Progressing   Problem: Pain Managment: Goal: General experience of comfort will improve Outcome: Progressing   Problem: Safety: Goal: Ability to remain free from injury will improve Outcome: Progressing   Problem: Skin Integrity: Goal: Risk for impaired skin integrity will decrease Outcome: Progressing   

## 2023-02-12 ENCOUNTER — Inpatient Hospital Stay: Payer: PPO

## 2023-02-12 DIAGNOSIS — D649 Anemia, unspecified: Secondary | ICD-10-CM | POA: Diagnosis not present

## 2023-02-12 DIAGNOSIS — R569 Unspecified convulsions: Secondary | ICD-10-CM | POA: Diagnosis not present

## 2023-02-12 DIAGNOSIS — D693 Immune thrombocytopenic purpura: Secondary | ICD-10-CM | POA: Diagnosis not present

## 2023-02-12 DIAGNOSIS — E878 Other disorders of electrolyte and fluid balance, not elsewhere classified: Secondary | ICD-10-CM | POA: Diagnosis not present

## 2023-02-12 LAB — BLOOD GAS, ARTERIAL
Acid-Base Excess: 3.7 mmol/L — ABNORMAL HIGH (ref 0.0–2.0)
Bicarbonate: 25.9 mmol/L (ref 20.0–28.0)
FIO2: 0.32 %
O2 Content: 3 L/min
O2 Saturation: 99.7 %
Patient temperature: 37
pCO2 arterial: 31 mmHg — ABNORMAL LOW (ref 32–48)
pH, Arterial: 7.53 — ABNORMAL HIGH (ref 7.35–7.45)
pO2, Arterial: 118 mmHg — ABNORMAL HIGH (ref 83–108)

## 2023-02-12 LAB — BASIC METABOLIC PANEL
Anion gap: 9 (ref 5–15)
BUN: 14 mg/dL (ref 8–23)
CO2: 22 mmol/L (ref 22–32)
Calcium: 7.4 mg/dL — ABNORMAL LOW (ref 8.9–10.3)
Chloride: 104 mmol/L (ref 98–111)
Creatinine, Ser: 1.03 mg/dL (ref 0.61–1.24)
GFR, Estimated: >60 mL/min (ref >60)
Glucose, Bld: 94 mg/dL (ref 70–99)
Potassium: 4.1 mmol/L (ref 3.5–5.1)
Sodium: 135 mmol/L (ref 135–145)

## 2023-02-12 LAB — GLUCOSE, CAPILLARY: Glucose-Capillary: 84 mg/dL (ref 70–99)

## 2023-02-12 LAB — TROPONIN I (HIGH SENSITIVITY)
Troponin I (High Sensitivity): 140 ng/L (ref <18)
Troponin I (High Sensitivity): 196 ng/L (ref <18)

## 2023-02-12 LAB — D-DIMER, QUANTITATIVE: D-Dimer, Quant: 2.6 ug/mL-FEU — ABNORMAL HIGH (ref 0.00–0.50)

## 2023-02-12 MED ORDER — SODIUM CHLORIDE 0.9 % IV SOLN
1.0000 g | INTRAVENOUS | Status: DC
  Administered 2023-02-12 – 2023-02-14 (×3): 1 g via INTRAVENOUS
  Filled 2023-02-12 (×3): qty 1

## 2023-02-12 MED ORDER — IOHEXOL 350 MG/ML SOLN
75.0000 mL | Freq: Once | INTRAVENOUS | Status: AC | PRN
  Administered 2023-02-12: 75 mL via INTRAVENOUS

## 2023-02-12 MED ORDER — SODIUM CHLORIDE 0.9 % IV SOLN
500.0000 mg | INTRAVENOUS | Status: DC
  Administered 2023-02-12 – 2023-02-14 (×3): 500 mg via INTRAVENOUS
  Filled 2023-02-12 (×3): qty 500

## 2023-02-12 MED ORDER — SODIUM CHLORIDE 0.9 % IV BOLUS
500.0000 mL | Freq: Once | INTRAVENOUS | Status: AC
  Administered 2023-02-12: 500 mL via INTRAVENOUS

## 2023-02-12 MED ORDER — LEVETIRACETAM 750 MG PO TABS: 750.0000 mg | ORAL_TABLET | Freq: Two times a day (BID) | ORAL | 0 refills | Status: DC

## 2023-02-12 NOTE — Progress Notes (Signed)
       CROSS COVER NOTE  NAME: [name] MRN: [id number] DOB : [birth year] ATTENDING PHYSICIAN: Oran Rein, MD    Date of Service   02/12/2023   HPI/Events of Note   Report/Request *** S: Elevated D-Dimer 2.6 at 1751 Elevated troponin 140 at 1751 B: Change of shift report patient was about to be discharged. Passed out x2 when standing. Low oxygen saturations now on 3.5 liters nasal cannula. Received 500 NS bolus. Portable CXR generated PNA. Now on antibiotics  On Review of chart *** Bedside eval*** HPI***  Interventions   Assessment/Plan: CTA PE --> ***negative for PE. Shows likely chronic occlussion of proximal subclavian artery. Consider outpatient Vascular consultation X X    *** professional thanks      To reach the provider On-Call:   7AM- 7PM see care teams to locate the attending and reach out to them via [email or web address] Password: [email or web address] 7PM-7AM contact night-coverage If you still have difficulty reaching the appropriate provider, please page the [employer] (Director on Call) for [facility] on amion for assistance  This document was prepared using Systems analyst and may include unintentional dictation errors.  Neomia Glass DNP, MBA, FNP-BC, PMHNP-BC Nurse Practitioner [facility] Pager [phone]

## 2023-02-12 NOTE — Progress Notes (Addendum)
Co-worker RN messaged me stated that student nurse came out of room and said patient had what seemed like a seizure. The nurse went to the room and patient was okay at that point VS taken and stable. Made MD aware, new order for orthostatics. When patient stood his HR 163, his face appeared purple and he what seemed to have passed out. Once laid back in bed he was sleepy but okay. Again made MD aware, new order for 55ml bolus NS and telemetry. Patient still fatigue and sleepy.

## 2023-02-12 NOTE — Progress Notes (Signed)
. Progress Note   Patient: [name] [id number] DOB: December 30, 1931 DOA: 02/10/2023     2 DOS: the patient was seen and examined on 02/12/2023   Brief hospital course:  ARDON DUGGAL is a 89 y.o. male with medical history significant of seizure disorder, CVA, GERD, hypertension, ITP, anemia, history of upper GI bleed presenting with seizure, electrolyte abnormality, atrial fibrillation, elevated troponin.  History primarily from patient's wife as patient is currently lethargic in the postictal state.  Per report, wife heard a loud grunt from patient in the middle of the night and then a loud thud with patient falling on the floor.  Patient was subsequently found down and lethargic.  EMS subsequently called.  Per the wife, this is usual pattern for patient's prior seizures.  Has been placed on Keppra for management.  Wife reports compliance with regimen with no missed doses.  No recent infections.  No nausea vomiting or abdominal pain.  No chest pain or shortness of breath.  Positive decreased p.o. intake over several weeks.  Has been otherwise stable up until this event.  Patient given 5 mg IM Versed upon arrival by EMS. Presented to the ER afebrile, hemodynamically stable.  Initially placed on nonrebreather now transition to 3 L of nasal cannula.  White count 7.2, hemoglobin 8.2, platelet count 168, urinalysis minimally indicative of infection.  Lactate 1.2, troponin 47, magnesium of less than 0.5.  Potassium of 3.0, calcium of 5.5.  CT head grossly stable.  Given IV Keppra load in the ER.  EKG with atrial fibrillation with heart rate in the 70s.  1/4: MRI was unremarkable no acute findings.  Patient was planned to be discharged on 750 mg twice daily Keppra however had near syncopal episode.  ?  Discharge held.  Assessment and Plan:  Electrolyte abnormality - repleted   K3.0, calcium 5.5, magnesium of less than 0.5 Getting repletion in the ER Suspect likely confounding issue to breakthrough  seizure Check ionized calcium, intact PTH and vitamin D level for hypocalcemia Trend repletion  Seizure-like activity   Positive breakthrough seizure at home despite regular home Keppra use per wife Was found on the floor in a stupor Usual pattern is generalized spasmodic/GTC behavior per the wife Status post Versed by EMS and route to the ER as well as IV keppra load on arrival  Suspect secondary to decreased p.o. intake and multiple electrolyte abnormalities CT head stable Will check Keppra level-Keppra dose increased to 750 mg twice daily from primary milligrams twice daily. Repeat electrolytes Neuroconsult Prn ativan for breakthrough seizure  Follow  Normocytic anemia  Hemoglobin 8.2 today with baseline hemoglobin around 9-11. No active bleeding Will monitor for now Transfuse for hemoglobin less than 7 Consider Hemoccult in the setting of prior history of upper GI bleeding if hemoglobin continues to significantly downtrend Follow  Idiopathic thrombocytopenic purpura  Platelets 168 today Follow  Stroke  On baby asa   (HFpEF) heart failure with preserved ejection fraction  Grade 1 diastolic function by 2D echo January 2021 Appears euvolemic Monitor volume status  Atrial fibrillation (HCC) EKG today with ?atrial fibrillation with a rate in the 70s Looks to be possible new diagnosis in  review of prior EKGs Will repeat EKG x 1 2D echo CHA2DS2-VASc score tentatively around 5 Likely not a anticoagulation candidate given recent issues with upper GI bleeding Formal cardiology consult in setting of elevated trop  Cardiology recommendations appreciated.  No complaints of anticoagulation.  Patient to follow with cardiology in  outpatient setting.  Elevated troponin Troponin 40s on presentation No active chest pain EKG with noted new onset atrial fibrillation-now in sinus Suspect heart strain in the setting of seizure event Will trend troponin for now Follow  GERD  (gastroesophageal reflux disease) PPI  CKD (chronic kidney disease), stage IIIa Cr 0.96 today Follow      Subjective: Patient seen and examined this morning.  No overnight events.  Patient has been seizure-free.  Wants to go home however later in the day had near syncopal episode versus seizure.    Physical Exam: Vitals:   02/11/23 2209 02/12/23 0510 02/12/23 0754 02/12/23 1321  BP: 122/81 122/78 (!) 142/77 107/80  Pulse: 94 (!) 54 86 95  Resp: 20 18 (!) 22   Temp: 98.7 F (37.1 C) 99.5 F (37.5 C) 99.8 F (37.7 C)   TempSrc:  Oral Oral   SpO2: 92% 98% (!) 87% 99%  Weight:      Height:       Physical Exam Constitutional:      Appearance: Normal appearance.  HENT:     Head: Normocephalic and atraumatic.     Nose: Nose normal.     Mouth/Throat:     Mouth: Mucous membranes are moist.  Eyes:     Extraocular Movements: Extraocular movements intact.     Pupils: Pupils are equal, round, and reactive to light.  Cardiovascular:     Rate and Rhythm: Normal rate and regular rhythm.  Pulmonary:     Effort: Pulmonary effort is normal.     Breath sounds: Normal breath sounds.  Abdominal:     General: Abdomen is flat.     Palpations: Abdomen is soft.  Musculoskeletal:        General: Normal range of motion.     Cervical back: Normal range of motion.  Skin:    General: Skin is warm.  Neurological:     General: No focal deficit present.     Mental Status: He is alert.  Psychiatric:        Mood and Affect: Mood normal.        Behavior: Behavior normal.     Data Reviewed:  There are no new results to review at this time.  Family Communication: None by bedside  Disposition: Status is: Inpatient Remains inpatient appropriate because: Breakthrough seizures.  Planned Discharge Destination: Home    Time spent: 35 minutes  Author: Oran Rein, MD 02/12/2023 2:00 PM  For on call review [email or web address].

## 2023-02-12 NOTE — Progress Notes (Signed)
On call provider notification [id number]  S:  Elevated D-Dimer 2.6 at 1751 Elevated troponin 140 at 1751  B:  Change of shift report patient was about to be discharged.  Passed out x2 when standing. Low oxygen saturations now on 3.5 liters nasal cannula.  Received 500 NS bolus.  Portable CXR generated PNA.  Now on antibiotics  33 M with Mhx of seizure disorder, CVA, GERD, hypertension, ITP, anemia, UGIB presenting with seizure, electrolyte abnormality, atrial fibrillation, elevated troponin.  History primarily from patient's wife as patient is currently lethargic in the postictal state.  Per report, wife heard a loud grunt from patient in the middle of the night and then a loud thud with patient falling on the floor.  Patient was subsequently found down and lethargic.  EMS subsequently called.  Per the wife, this is usual pattern for patient's prior seizures.  Has been placed on Keppra for management.  Wife reports compliance with regimen with no missed doses.  No recent infections.  No nausea vomiting or abdominal pain.  No chest pain or shortness of breath.  Positive decreased p.o. intake over several weeks.  Has been otherwise stable up until this event.  Patient given 5 mg IM Versed upon arrival by EMS. Presented to the ER afebrile, hemodynamically stable.  Initially placed on nonrebreather now transition to 3 L of nasal cannula.  White count 7.2, hemoglobin 8.2, platelet count 168, urinalysis minimally indicative of infection.  Lactate 1.2, troponin 47, magnesium of less than 0.5.  Potassium of 3.0, calcium of 5.5.  CT head grossly stable.  Given IV Keppra load in the ER.  EKG with atrial fibrillation with heart rate in the 70s.   1/4: MRI was unremarkable no acute findings.  Patient was planned to be discharged on 750 mg twice daily Keppra however had near syncopal episode.  ?  Discharge held.  A:  Just received report.  Not physically assessed at present this shift.  Reporting Elevated troponin and  D-dimer  R:    1943 Response CTA ordered  2201 CTA Impression negative for PE  1. No CT evidence of pulmonary artery embolus. 2. Eventration of the left hemidiaphragm with left lung base atelectasis or infiltrate. 3. 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 4.  Aortic Atherosclerosis (ICD10-I70.0).

## 2023-02-12 NOTE — Plan of Care (Signed)
MRI brain with no acute findings or e/o acute infarct. No further inpatient neurologic workup indicated. Continue keppra 750mg  bid. Neurology will be available for questions going forward.  Su Monks, MD [facility] [phone]  If 7pm- 7am, please page neurology on call as listed in [place].

## 2023-02-13 DIAGNOSIS — E878 Other disorders of electrolyte and fluid balance, not elsewhere classified: Secondary | ICD-10-CM | POA: Diagnosis not present

## 2023-02-13 DIAGNOSIS — D693 Immune thrombocytopenic purpura: Secondary | ICD-10-CM | POA: Diagnosis not present

## 2023-02-13 DIAGNOSIS — R569 Unspecified convulsions: Secondary | ICD-10-CM | POA: Diagnosis not present

## 2023-02-13 DIAGNOSIS — D649 Anemia, unspecified: Secondary | ICD-10-CM | POA: Diagnosis not present

## 2023-02-13 LAB — COMPREHENSIVE METABOLIC PANEL
ALT: 9 U/L (ref 0–44)
AST: 19 U/L (ref 15–41)
Albumin: 2.3 g/dL — ABNORMAL LOW (ref 3.5–5.0)
Alkaline Phosphatase: 49 U/L (ref 38–126)
Anion gap: 8 (ref 5–15)
BUN: 15 mg/dL (ref 8–23)
CO2: 24 mmol/L (ref 22–32)
Calcium: 7.6 mg/dL — ABNORMAL LOW (ref 8.9–10.3)
Chloride: 103 mmol/L (ref 98–111)
Creatinine, Ser: 0.95 mg/dL (ref 0.61–1.24)
GFR, Estimated: >60 mL/min (ref >60)
Glucose, Bld: 79 mg/dL (ref 70–99)
Potassium: 3.9 mmol/L (ref 3.5–5.1)
Sodium: 135 mmol/L (ref 135–145)
Total Bilirubin: 0.7 mg/dL (ref 0.3–1.2)
Total Protein: 5 g/dL — ABNORMAL LOW (ref 6.5–8.1)

## 2023-02-13 LAB — CBC
HCT: 25.9 % — ABNORMAL LOW (ref 39.0–52.0)
Hemoglobin: 8.6 g/dL — ABNORMAL LOW (ref 13.0–17.0)
MCH: 30 pg (ref 26.0–34.0)
MCHC: 33.2 g/dL (ref 30.0–36.0)
MCV: 90.2 fL (ref 80.0–100.0)
Platelets: 155 10*3/uL (ref 150–400)
RBC: 2.87 MIL/uL — ABNORMAL LOW (ref 4.22–5.81)
RDW: 14.5 % (ref 11.5–15.5)
WBC: 6.3 10*3/uL (ref 4.0–10.5)
nRBC: 0 % (ref 0.0–0.2)

## 2023-02-13 LAB — TROPONIN I (HIGH SENSITIVITY)
Troponin I (High Sensitivity): 205 ng/L (ref <18)
Troponin I (High Sensitivity): 244 ng/L (ref <18)
Troponin I (High Sensitivity): 273 ng/L (ref <18)

## 2023-02-13 LAB — MAGNESIUM: Magnesium: 1.7 mg/dL (ref 1.7–2.4)

## 2023-02-13 NOTE — Progress Notes (Signed)
.. Progress Note   Patient: [name] [id number] DOB: 1931/12/29 DOA: 02/10/2023     3 DOS: the patient was seen and examined on 02/13/2023   Brief hospital course:  [name] is a 88 y.o. male with medical history significant of seizure disorder, CVA, GERD, hypertension, ITP, anemia, history of upper GI bleed presenting with seizure, electrolyte abnormality, atrial fibrillation, elevated troponin.  History primarily from patient's wife as patient is currently lethargic in the postictal state.  Per report, wife heard a loud grunt from patient in the middle of the night and then a loud thud with patient falling on the floor.  Patient was subsequently found down and lethargic.  EMS subsequently called.  Per the wife, this is usual pattern for patient's prior seizures.  Has been placed on Keppra for management.  Wife reports compliance with regimen with no missed doses.  No recent infections.  No nausea vomiting or abdominal pain.  No chest pain or shortness of breath.  Positive decreased p.o. intake over several weeks.  Has been otherwise stable up until this event.  Patient given 5 mg IM Versed upon arrival by EMS. Presented to the ER afebrile, hemodynamically stable.  Initially placed on nonrebreather now transition to 3 L of nasal cannula.  White count 7.2, hemoglobin 8.2, platelet count 168, urinalysis minimally indicative of infection.  Lactate 1.2, troponin 47, magnesium of less than 0.5.  Potassium of 3.0, calcium of 5.5.  CT head grossly stable.  Given IV Keppra load in the ER.  EKG with atrial fibrillation with heart rate in the 70s.  1/4: MRI was unremarkable no acute findings.  Patient was planned to be discharged on 750 mg twice daily Keppra however had near syncopal episode.  ?  Discharge held.  2/4 : Patient developed cough with productive sputum with findings of pneumonia on chest x-ray.  Started outpatient on ceftriaxone and azithromycin.  Discharge held as patient is requiring  supplemental oxygen at 2 L and symptomatic for pneumonia.  Assessment and Plan:  Electrolyte abnormality - repleted   K3.0, calcium 5.5, magnesium of less than 0.5 Getting repletion in the ER Suspect likely confounding issue to breakthrough seizure Check ionized calcium, intact PTH and vitamin D level for hypocalcemia Trend repletion  Seizure-like activity   Positive breakthrough seizure at home despite regular home Keppra use per wife Was found on the floor in a stupor Usual pattern is generalized spasmodic/GTC behavior per the wife Status post Versed by EMS and route to the ER as well as IV keppra load on arrival  Suspect secondary to decreased p.o. intake and multiple electrolyte abnormalities CT head stable Will check Keppra level-Keppra dose increased to 750 mg twice daily from primary milligrams twice daily. Repeat electrolytes Neuroconsult Prn ativan for breakthrough seizure  Follow  Normocytic anemia  Hemoglobin 8.2 today with baseline hemoglobin around 9-11. No active bleeding Will monitor for now Transfuse for hemoglobin less than 7 Consider Hemoccult in the setting of prior history of upper GI bleeding if hemoglobin continues to significantly downtrend Follow  Idiopathic thrombocytopenic purpura  Platelets 168 today Follow  Stroke  On baby asa   (HFpEF) heart failure with preserved ejection fraction  Grade 1 diastolic function by 2D echo January 2021 Appears euvolemic Monitor volume status  Atrial fibrillation (HCC) EKG today with ?atrial fibrillation with a rate in the 70s Looks to be possible new diagnosis in  review of prior EKGs Will repeat EKG x 1 2D echo CHA2DS2-VASc score  tentatively around 5 Likely not a anticoagulation candidate given recent issues with upper GI bleeding Formal cardiology consult in setting of elevated trop  Cardiology recommendations appreciated.  No complaints of anticoagulation.  Patient to follow with cardiology in  outpatient setting.  Elevated troponin Troponin 40s on presentation No active chest pain EKG with noted new onset atrial fibrillation-now in sinus Suspect heart strain in the setting of seizure event Will trend troponin for now Follow  GERD (gastroesophageal reflux disease) PPI  CKD (chronic kidney disease), stage IIIa Cr 0.96 today Follow      Subjective: Patient seen and examined this morning.  Patient had an episode of syncope and shortness of breath yesterday which was admitted with chest x-ray which showed pneumonia.  Other vital stables, labs with no leukocytosis.     physical Exam: Vitals:   02/12/23 2010 02/13/23 0232 02/13/23 0508 02/13/23 0732  BP: 113/70 109/65 110/66 105/68  Pulse: 93 73 69 76  Resp: 18 18 18 18   Temp: 98.6 F (37 C) 98.3 F (36.8 C) 98.1 F (36.7 C) 98.2 F (36.8 C)  TempSrc: Oral Oral Oral Oral  SpO2: 100% 100% 100% 99%  Weight:      Height:       Physical Exam Constitutional:      Appearance: Normal appearance.  HENT:     Head: Normocephalic and atraumatic.     Nose: Nose normal.     Mouth/Throat:     Mouth: Mucous membranes are moist.  Eyes:     Extraocular Movements: Extraocular movements intact.     Pupils: Pupils are equal, round, and reactive to light.  Cardiovascular:     Rate and Rhythm: Normal rate and regular rhythm.  Pulmonary:     Effort: Pulmonary effort is normal.     Breath sounds: Normal breath sounds.  Abdominal:     General: Abdomen is flat.     Palpations: Abdomen is soft.  Musculoskeletal:        General: Normal range of motion.     Cervical back: Normal range of motion.  Skin:    General: Skin is warm.  Neurological:     General: No focal deficit present.     Mental Status: He is alert.  Psychiatric:        Mood and Affect: Mood normal.        Behavior: Behavior normal.     Data Reviewed:  There are no new results to review at this time.  Family Communication: None by  bedside  Disposition: Status is: Inpatient Remains inpatient appropriate because: Breakthrough seizures.  Planned Discharge Destination: Home    Time spent: 35 minutes  Author: Oran Rein, MD 02/13/2023 3:09 PM  For on call review [email or web address].

## 2023-02-13 NOTE — Care Management Important Message (Signed)
Important Message  Patient Details  Name: [name] MRN: [id number] Date of Birth: Aug 24, 1932   Medicare Important Message Given:  Yes     Dannette Barbara 02/13/2023, 2:46 PM

## 2023-02-14 DIAGNOSIS — E878 Other disorders of electrolyte and fluid balance, not elsewhere classified: Secondary | ICD-10-CM | POA: Diagnosis not present

## 2023-02-14 DIAGNOSIS — D649 Anemia, unspecified: Secondary | ICD-10-CM | POA: Diagnosis not present

## 2023-02-14 DIAGNOSIS — R569 Unspecified convulsions: Secondary | ICD-10-CM | POA: Diagnosis not present

## 2023-02-14 MED ORDER — POLYETHYLENE GLYCOL 3350 17 G PO PACK
17.0000 g | PACK | Freq: Every day | ORAL | Status: DC
  Administered 2023-02-14: 17 g via ORAL
  Filled 2023-02-14 (×2): qty 1

## 2023-02-14 MED ORDER — SODIUM CHLORIDE 0.9 % IV BOLUS
500.0000 mL | Freq: Once | INTRAVENOUS | Status: AC
  Administered 2023-02-14: 500 mL via INTRAVENOUS

## 2023-02-14 NOTE — Evaluation (Signed)
Physical Therapy Evaluation Patient Details Name: [name] MRN: [id number] DOB: [birth year] Today's Date: 02/14/2023  History of Present Illness  [name] is a 89 y.o. male with medical history significant of seizure disorder, CVA, GERD, hypertension, ITP, anemia, history of upper GI bleed presenting with seizure, electrolyte abnormality, atrial fibrillation, elevated troponin.  History primarily from patient's wife as patient is currently lethargic in the postictal state.  Per report, wife heard a loud grunt from patient in the middle of the night and then a loud thud with patient falling on the floor.  Patient was subsequently found down and lethargic.  EMS subsequently called.  Per the wife, this is usual pattern for patient's prior seizures.  Has been placed on Keppra for management. patient was to be discharged 2 days ago and passed out twice preparing for discharge so his stay was continued.   Clinical Impression  Patient received in bed. He is alert, pleasant. Wants to go home today. He is agreeable to PT assessment. He is mod I with bed mobility. Reports dizziness with sitting up. Required increased time sitting to clear. Patient then needed min A to stand from slightly elevated bed. Again reports dizziness with standing. Stood a couple of minutes and then felt like he could walk. After about 6 feet patient started leaning forward and became unsteady, needed mod A to prevent fall, required PT to pull up chair behind him and sat patient down. Patient's BP checked and seated BP was 143/76, standing 103/59. O2 saturations on room air at 96% throughout session. Patient left on room air. RN notified of events and orthostasis. Patient left in recliner with alarm donned, needs met. He will continue to benefit from skilled PT to improve safety with mobility.            Recommendations for follow up therapy are one component of a multi-disciplinary discharge planning process, led by the attending  physician.  Recommendations may be updated based on patient status, additional functional criteria and insurance authorization.  Follow Up Recommendations Can patient physically be transported by private vehicle: Yes     Assistance Recommended at Discharge Frequent or constant Supervision/Assistance  Patient can return home with the following  A lot of help with walking and/or transfers;A little help with bathing/dressing/bathroom;Help with stairs or ramp for entrance;Assistance with cooking/housework;Assist for transportation    Equipment Recommendations None recommended by PT  Recommendations for Other Services       Functional Status Assessment Patient has had a recent decline in their functional status and demonstrates the ability to make significant improvements in function in a reasonable and predictable amount of time.     Precautions / Restrictions Precautions Precautions: Fall Precaution Comments: Seizure Restrictions Weight Bearing Restrictions: No      Mobility  Bed Mobility Overal bed mobility: Modified Independent             General bed mobility comments: increased time and effort needed, patient reports dizziness with sitting. States" I haven't been up in a few days"    Transfers Overall transfer level: Needs assistance Equipment used: Rolling walker (2 wheels) Transfers: Sit to/from Stand Sit to Stand: From elevated surface, Min assist           General transfer comment: patient reports dizziness with standing. Required standing for a couple of minutes prior to attempted ambulation.    Ambulation/Gait Ambulation/Gait assistance: Min assist, Max Web designer (Feet): 10 Feet Assistive device: Rolling walker (2 wheels) Gait Pattern/deviations:  Step-through pattern, Decreased step length - right, Decreased step length - left Gait velocity: decr     General Gait Details: patient ambulated about 5 feet, and then started going forward,  letting go of walker. Chair pulled up behind him and he sat down. BP taken after several minutes of sitting (due to lack of equipment and assistance) and was 143/76, upon standing BP was 103/59.  Stairs            Wheelchair Mobility    Modified Rankin (Stroke Patients Only)       Balance Overall balance assessment: Needs assistance Sitting-balance support: Feet supported Sitting balance-Leahy Scale: Fair     Standing balance support: Bilateral upper extremity supported, During functional activity, Reliant on assistive device for balance Standing balance-Leahy Scale: Fair Standing balance comment: reliant on AD and assist                             Pertinent Vitals/Pain Pain Assessment Pain Assessment: No/denies pain    Home Living Family/patient expects to be discharged to:: Private residence Living Arrangements: Spouse/significant other Available Help at Discharge: Family Type of Home: House Home Access: Stairs to enter Entrance Stairs-Rails: Right Entrance Stairs-Number of Steps: 3   Home Layout: One level Home Equipment: Conservation officer, nature (2 wheels);Cane - single point      Prior Function Prior Level of Function : Independent/Modified Independent             Mobility Comments: Uses RW ADLs Comments: Ind in all aspects of care     Hand Dominance   Dominant Hand: Right    Extremity/Trunk Assessment   Upper Extremity Assessment Upper Extremity Assessment: Generalized weakness    Lower Extremity Assessment Lower Extremity Assessment: Generalized weakness    Cervical / Trunk Assessment Cervical / Trunk Assessment: Normal  Communication   Communication: No difficulties  Cognition Arousal/Alertness: Awake/alert Behavior During Therapy: WFL for tasks assessed/performed Overall Cognitive Status: No family/caregiver present to determine baseline cognitive functioning                                          General  Comments      Exercises     Assessment/Plan    PT Assessment Patient needs continued PT services  PT Problem List Decreased activity tolerance;Decreased mobility;Decreased safety awareness;Decreased balance       PT Treatment Interventions Gait training;Stair training;Functional mobility training;Therapeutic activities;Patient/family education;Balance training;Therapeutic exercise;DME instruction    PT Goals (Current goals can be found in the Care Plan section)  Acute Rehab PT Goals Patient Stated Goal: to return home today PT Goal Formulation: With patient Time For Goal Achievement: 02/28/23 Potential to Achieve Goals: Poor    Frequency Min 3X/week     Co-evaluation               AM-PAC PT "6 Clicks" Mobility  Outcome Measure Help needed turning from your back to your side while in a flat bed without using bedrails?: A Little Help needed moving from lying on your back to sitting on the side of a flat bed without using bedrails?: A Little Help needed moving to and from a bed to a chair (including a wheelchair)?: A Lot Help needed standing up from a chair using your arms (e.g., wheelchair or bedside chair)?: A Little Help needed to walk in hospital room?:  A Lot Help needed climbing 3-5 steps with a railing? : Total 6 Click Score: 14    End of Session Equipment Utilized During Treatment: Gait belt;Oxygen Activity Tolerance: Treatment limited secondary to medical complications (Comment) (orthostatic) Patient left: in chair;with call bell/phone within reach;with chair alarm set Nurse Communication: Mobility status;Other (comment) (orthostatic with near fall) PT Visit Diagnosis: Other abnormalities of gait and mobility (R26.89);Difficulty in walking, not elsewhere classified (R26.2)    Time: [id number] PT Time Calculation (min) (ACUTE ONLY): 29 min   Charges:   PT Evaluation $PT Eval Moderate Complexity: 1 Mod PT Treatments $Gait Training: 8-22 mins         Shineka Auble, PT, GCS 02/14/23,10:13 AM

## 2023-02-14 NOTE — Progress Notes (Signed)
... Progress Note   Patient: [name] [id number] DOB: June 06, 1932 DOA: 02/10/2023     4 DOS: the patient was seen and examined on 02/14/2023   Brief hospital course:  [name] is a 89 y.o. male with medical history significant of seizure disorder, CVA, GERD, hypertension, ITP, anemia, history of upper GI bleed presenting with seizure, electrolyte abnormality, atrial fibrillation, elevated troponin.  History primarily from patient's wife as patient is currently lethargic in the postictal state.  Per report, wife heard a loud grunt from patient in the middle of the night and then a loud thud with patient falling on the floor.  Patient was subsequently found down and lethargic.  EMS subsequently called.  Per the wife, this is usual pattern for patient's prior seizures.  Has been placed on Keppra for management.  Wife reports compliance with regimen with no missed doses.  No recent infections.  No nausea vomiting or abdominal pain.  No chest pain or shortness of breath.  Positive decreased p.o. intake over several weeks.  Has been otherwise stable up until this event.  Patient given 5 mg IM Versed upon arrival by EMS. Presented to the ER afebrile, hemodynamically stable.  Initially placed on nonrebreather now transition to 3 L of nasal cannula.  White count 7.2, hemoglobin 8.2, platelet count 168, urinalysis minimally indicative of infection.  Lactate 1.2, troponin 47, magnesium of less than 0.5.  Potassium of 3.0, calcium of 5.5.  CT head grossly stable.  Given IV Keppra load in the ER.  EKG with atrial fibrillation with heart rate in the 70s.  1/4: MRI was unremarkable no acute findings.  Patient was planned to be discharged on 750 mg twice daily Keppra however had near syncopal episode.  ?  Discharge held.  2/4 : Patient developed cough with productive sputum with findings of pneumonia on chest x-ray.  Started outpatient on ceftriaxone and azithromycin.  Discharge held as patient is requiring  supplemental oxygen at 2 L and symptomatic for pneumonia.  4/3 : Patient had improvement in symptoms but was orthostatic in the morning.  In the setting of resuming his home medications.  Provided with IV fluids.  Possible discharge in the morning if patient continues to stay asymptomatic.  Assessment and Plan:  Electrolyte abnormality - repleted   K3.0, calcium 5.5, magnesium of less than 0.5 Getting repletion in the ER Suspect likely confounding issue to breakthrough seizure Check ionized calcium, intact PTH and vitamin D level for hypocalcemia Trend repletion  Seizure-like activity -no more seizures since admission  Positive breakthrough seizure at home despite regular home Keppra use per wife Was found on the floor in a stupor Usual pattern is generalized spasmodic/GTC behavior per the wife Status post Versed by EMS and route to the ER as well as IV keppra load on arrival  Suspect secondary to decreased p.o. intake and multiple electrolyte abnormalities CT head stable Will check Keppra level-Keppra dose increased to 750 mg twice daily from primary milligrams twice daily. Repeat electrolytes Neuroconsult Prn ativan for breakthrough seizure  Follow  Normocytic anemia  Hemoglobin 8.2 today with baseline hemoglobin around 9-11. No active bleeding Will monitor for now Transfuse for hemoglobin less than 7 Consider Hemoccult in the setting of prior history of upper GI bleeding if hemoglobin continues to significantly downtrend Follow  Idiopathic thrombocytopenic purpura  Platelets 168 today Follow  Stroke  On baby asa   (HFpEF) heart failure with preserved ejection fraction  Grade 1 diastolic function by 2D  echo January 2021 Appears euvolemic Monitor volume status  Atrial fibrillation (HCC) EKG today with ?atrial fibrillation with a rate in the 70s Looks to be possible new diagnosis in  review of prior EKGs Will repeat EKG x 1 2D echo CHA2DS2-VASc score tentatively  around 5 Likely not a anticoagulation candidate given recent issues with upper GI bleeding Formal cardiology consult in setting of elevated trop  Cardiology recommendations appreciated.  No complaints of anticoagulation.  Patient to follow with cardiology in outpatient setting.  Elevated troponin Troponin 40s on presentation No active chest pain EKG with noted new onset atrial fibrillation-now in sinus Suspect heart strain in the setting of seizure event Will trend troponin for now Follow  GERD (gastroesophageal reflux disease) PPI  CKD (chronic kidney disease), stage IIIa Cr 0.96 today Follow      Subjective: Patient seen and examined this morning.  Patient had an episode of syncope and shortness of breath yesterday which was admitted with chest x-ray which showed pneumonia.  Other vital stables, labs with no leukocytosis.  Patient was orthostatic received IV fluids likely from resuming his home medications.  Will monitor for 24 hours possible discharge in the morning   physical Exam: Vitals:   02/14/23 0800 02/14/23 0957 02/14/23 1202 02/14/23 1546  BP: (!) 142/90  103/83 (!) 139/94  Pulse: 91  83 81  Resp: 18  16 17   Temp: 98.2 F (36.8 C)  97.8 F (36.6 C) 98.2 F (36.8 C)  TempSrc: Oral  Oral Oral  SpO2: 100% 96% 90%   Weight:      Height:       Physical Exam Constitutional:      Appearance: Normal appearance.  HENT:     Head: Normocephalic and atraumatic.     Nose: Nose normal.     Mouth/Throat:     Mouth: Mucous membranes are moist.  Eyes:     Extraocular Movements: Extraocular movements intact.     Pupils: Pupils are equal, round, and reactive to light.  Cardiovascular:     Rate and Rhythm: Normal rate and regular rhythm.  Pulmonary:     Effort: Pulmonary effort is normal.     Breath sounds: Normal breath sounds.  Abdominal:     General: Abdomen is flat.     Palpations: Abdomen is soft.  Musculoskeletal:        General: Normal range of motion.      Cervical back: Normal range of motion.  Skin:    General: Skin is warm.  Neurological:     General: No focal deficit present.     Mental Status: He is alert.  Psychiatric:        Mood and Affect: Mood normal.        Behavior: Behavior normal.     Data Reviewed:  There are no new results to review at this time.  Family Communication: None by bedside  Disposition: Status is: Inpatient Remains inpatient appropriate because: Breakthrough seizures.  Planned Discharge Destination: Home    Time spent: 35 minutes  Author: Oran Rein, MD 02/14/2023 4:34 PM  For on call review [email or web address].

## 2023-02-15 ENCOUNTER — Encounter: Payer: Self-pay | Admitting: Family Medicine

## 2023-02-15 DIAGNOSIS — E872 Acidosis, unspecified: Secondary | ICD-10-CM

## 2023-02-15 DIAGNOSIS — I480 Paroxysmal atrial fibrillation: Secondary | ICD-10-CM | POA: Diagnosis not present

## 2023-02-15 DIAGNOSIS — I5032 Chronic diastolic (congestive) heart failure: Secondary | ICD-10-CM | POA: Diagnosis not present

## 2023-02-15 DIAGNOSIS — E878 Other disorders of electrolyte and fluid balance, not elsewhere classified: Secondary | ICD-10-CM | POA: Diagnosis not present

## 2023-02-15 DIAGNOSIS — E871 Hypo-osmolality and hyponatremia: Secondary | ICD-10-CM

## 2023-02-15 DIAGNOSIS — L8990 Pressure ulcer of unspecified site, unspecified stage: Secondary | ICD-10-CM | POA: Insufficient documentation

## 2023-02-15 DIAGNOSIS — E876 Hypokalemia: Secondary | ICD-10-CM

## 2023-02-15 LAB — CULTURE, BLOOD (ROUTINE X 2)
Culture: NO GROWTH
Culture: NO GROWTH
Special Requests: ADEQUATE

## 2023-02-15 LAB — LEVETIRACETAM LEVEL: Levetiracetam Lvl: 56.5 ug/mL — ABNORMAL HIGH (ref 10.0–40.0)

## 2023-02-15 MED ORDER — CEFDINIR 300 MG PO CAPS: 300.0000 mg | ORAL_CAPSULE | Freq: Two times a day (BID) | ORAL | 0 refills | Status: AC

## 2023-02-15 MED ORDER — OYSTER SHELL CALCIUM/D3 500-5 MG-MCG PO TABS: 1.0000 | ORAL_TABLET | Freq: Two times a day (BID) | ORAL | 0 refills | Status: DC

## 2023-02-15 NOTE — Discharge Summary (Addendum)
Physician Discharge Summary   Patient: [name] MRN: [id number] DOB: August 03, 1932  Admit date:     02/10/2023  Discharge date: 02/15/23  Discharge Physician: Sharen Hones   PCP: [name] Jude, MD   Recommendations at discharge:   Follow-up with PCP in 1 week.  Discharge Diagnoses: Principal Problem:   Electrolyte abnormality Active Problems:   Seizure-like activity   Normocytic anemia   Idiopathic thrombocytopenic purpura   Stroke   CKD (chronic kidney disease), stage IIIa   GERD (gastroesophageal reflux disease)   Elevated troponin   Paroxysmal atrial fibrillation   Chronic diastolic CHF (congestive heart failure)   Pressure injury of skin   Hypocalcemia Bilateral lower lobe pneumonia. Resolved Problems:   Hypokalemia   Hyponatremia   Hypomagnesemia   Metabolic acidosis  Hospital Course: [name] is a 89 y.o. male with medical history significant of seizure disorder, CVA, GERD, hypertension, ITP, anemia, history of upper GI bleed presenting with seizure, electrolyte abnormality, atrial fibrillation, elevated troponin.  History primarily from patient's wife as patient is currently lethargic in the postictal state.  Per report, wife heard a loud grunt from patient in the middle of the night and then a loud thud with patient falling on the floor.  Patient was subsequently found down and lethargic.  EMS subsequently called.  Per the wife, this is usual pattern for patient's prior seizures.  Has been placed on Keppra for management.  Wife reports compliance with regimen with no missed doses.  No recent infections.  No nausea vomiting or abdominal pain.  No chest pain or shortness of breath.  Positive decreased p.o. intake over several weeks.  Has been otherwise stable up until this event.  Patient given 5 mg IM Versed upon arrival by EMS. Presented to the ER afebrile, hemodynamically stable.  Initially placed on nonrebreather now transition to 3 L of nasal cannula.  White count  7.2, hemoglobin 8.2, platelet count 168, urinalysis minimally indicative of infection.  Lactate 1.2, troponin 47, magnesium of less than 0.5.  Potassium of 3.0, calcium of 5.5.  CT head grossly stable.  Given IV Keppra load in the ER.  EKG with atrial fibrillation with heart rate in the 70s.   1/4: MRI was unremarkable no acute findings.  Patient was planned to be discharged on 750 mg twice daily Keppra however had near syncopal episode.  ?  Discharge held.   2/4 : Patient developed cough with productive sputum with findings of pneumonia on chest x-ray.  Started outpatient on ceftriaxone and azithromycin.  Discharge held as patient is requiring supplemental oxygen at 2 L and symptomatic for pneumonia.   4/3 : Patient had improvement in symptoms but was orthostatic in the morning.  In the setting of resuming his home medications.  Provided with IV fluids.  Possible discharge in the morning if patient continues to stay asymptomatic.  Assessment and Plan: Electrolyte abnormality - repleted  Patient had a severe hypokalemia, hypomagnesemia, metabolic acidosis and hypocalcemia. Condition largely improved, calcium still borderline low, start calcium with D.  Seizure-like activity -no more seizures since admission   Positive breakthrough seizure at home despite regular home Keppra use per wife Was found on the floor in a stupor Usual pattern is generalized spasmodic/GTC behavior per the wife Status post Versed by EMS and route to the ER as well as IV keppra load on arrival  Suspect secondary to decreased p.o. intake and multiple electrolyte abnormalities CT head stable Keppra dose increased.  Pneumonia. Did  not have sepsis, chest x-ray showed bilateral lower lobe infiltrates.  Was treated with Rocephin and Zithromax, will continue cefdinir for short course.  Blood cultures at time of admission negative.   Normocytic anemia  Idiopathic thrombocytopenic purpura  Hemoglobin stable, restart home  dose of vitamin B12.  Platelet count improved.   Stroke  On baby asa    (HFpEF) heart failure with preserved ejection fraction  No exacerbation.   Atrial fibrillation ([place]) EKG today with ?atrial fibrillation with a rate in the 70s Looks to be possible new diagnosis in  review of prior EKGs Echocardiogram showed ejection in 60 to 65% with grade 1 diastolic dysfunction.  Patient appears in sinus rhythm now. CHA2DS2-VASc score tentatively around 5 Likely not a anticoagulation candidate given recent issues with upper GI bleeding Formal cardiology consult in setting of elevated trop  Cardiology recommendations appreciated.  No plan for anticoagulation.  Patient to follow with cardiology in outpatient setting.   Elevated troponin Troponin 40s on presentation No active chest pain EKG with noted new onset atrial fibrillation-now in sinus Suspect heart strain in the setting of seizure event    GERD (gastroesophageal reflux disease) PPI   CKD (chronic kidney disease), stage IIIa Stable.  Pressure ulcers POA  Pressure Injury 02/10/23 Buttocks Right;Left Stage 1 -  Intact skin with non-blanchable redness of a localized area usually over a bony prominence. (Active)  02/10/23 1050  Location: Buttocks  Location Orientation: Right;Left  Staging: Stage 1 -  Intact skin with non-blanchable redness of a localized area usually over a bony prominence.  Wound Description (Comments):   Present on Admission: Yes         Consultants: Cardiology. Procedures performed: None  Disposition: Home health Diet recommendation:  Discharge Diet Orders (From admission, onward)     Start     Ordered   02/15/23 0000  Diet - low sodium heart healthy        02/15/23 1048   02/12/23 0000  Diet - low sodium heart healthy        02/12/23 1329           Cardiac diet DISCHARGE MEDICATION: Allergies as of 02/15/2023       Reactions   Meperidine Hives        Medication List     STOP taking  these medications    doxycycline 100 MG tablet Commonly known as: VIBRA-TABS       TAKE these medications    acetaminophen 325 MG tablet Commonly known as: TYLENOL Take 650 mg by mouth every 6 (six) hours as needed for mild pain.   acetaminophen-codeine 300-30 MG tablet Commonly known as: TYLENOL #3 Take 1-2 tablets by mouth every 4 (four) hours as needed for moderate pain.   aspirin EC 81 MG tablet Take 1 tablet (81 mg total) by mouth daily.   calcium-vitamin D 500-5 MG-MCG tablet Commonly known as: OSCAL WITH D Take 1 tablet by mouth 2 (two) times daily.   cefdinir 300 MG capsule Commonly known as: OMNICEF Take 1 capsule (300 mg total) by mouth 2 (two) times daily for 2 days.   EQ Nasal Allergy 55 MCG/ACT Aero nasal inhaler Generic drug: triamcinolone Place 2 sprays into the nose 2 (two) times daily.   ferrous sulfate 325 (65 FE) MG tablet Take 325 mg by mouth every other day as needed.   levETIRAcetam 750 MG tablet Commonly known as: KEPPRA Take 1 tablet (750 mg total) by mouth 2 (two) times daily. What  changed:  medication strength how much to take additional instructions   levothyroxine 50 MCG tablet Commonly known as: SYNTHROID Take 50 mcg by mouth daily.   magnesium oxide 400 (240 Mg) MG tablet Commonly known as: MAG-OX Take 1 tablet (400 mg total) by mouth daily.   nystatin cream Commonly known as: MYCOSTATIN Apply topically 2 (two) times daily.   pantoprazole 40 MG tablet Commonly known as: PROTONIX Take 1 tablet (40 mg total) by mouth at bedtime.   PHILLIPS COLON HEALTH PO Take by mouth daily.   RABEprazole 20 MG tablet Commonly known as: ACIPHEX Take 1 tablet by mouth daily. 1 hour before meals   Santyl 250 UNIT/GM ointment Generic drug: collagenase Apply 1 Application topically daily.   STOOL SOFTENER LAXATIVE PO Take by mouth.   triamcinolone cream 0.1 % Commonly known as: KENALOG Apply topically 2 (two) times daily.   VITAMIN  B12 PO Take by mouth daily.   VITAMIN D PO Take by mouth daily.               Discharge Care Instructions  (From admission, onward)           Start     Ordered   02/15/23 0000  Discharge wound care:       Comments: Wound care to left lateral LE traumatic wound: Cleanse leg with soap and water, rinse and dry. Cover affected area with folded layer of xeroform gauze Kellie Simmering # 295), cover with dry gauze and secure with a few turns of Kerlix roll gauze/.paper tape. Do not use silicone foam dressing to secure. Follow-up with visiting RN.   02/15/23 1048   02/12/23 0000  Change dressing (specify)       Comments: As per wound care   02/12/23 1329            Follow-up Information     [name] Jude, MD Follow up in 1 week(s).   Specialty: Family Medicine Contact information: [address] [place] [id number] [phone]                Discharge Exam: Filed Weights   02/10/23 0749 02/10/23 1614  Weight: 74.8 kg 72.2 kg   General exam: Appears calm and comfortable  Respiratory system: Clear to auscultation. Respiratory effort normal. Cardiovascular system: S1 & S2 heard, RRR. No JVD, murmurs, rubs, gallops or clicks. No pedal edema. Gastrointestinal system: Abdomen is nondistended, soft and nontender. No organomegaly or masses felt. Normal bowel sounds heard. Central nervous system: Alert and oriented. No focal neurological deficits. Extremities: Symmetric 5 x 5 power. Skin: No rashes, lesions or ulcers Psychiatry: Judgement and insight appear normal. Mood & affect appropriate.    Condition at discharge: good  The results of significant diagnostics from this hospitalization (including imaging, microbiology, ancillary and laboratory) are listed below for reference.   Imaging Studies: CT Angio Chest Pulmonary Embolism (PE) W or WO Contrast  Result Date: 02/12/2023 CLINICAL DATA:  Concern for point embolism. EXAM: CT ANGIOGRAPHY CHEST WITH CONTRAST  TECHNIQUE: Multidetector CT imaging of the chest was performed using the standard protocol during bolus administration of intravenous contrast. Multiplanar CT image reconstructions and MIPs were obtained to evaluate the vascular anatomy. RADIATION DOSE REDUCTION: This exam was performed according to the departmental dose-optimization program which includes automated exposure control, adjustment of the mA and/or kV according to patient size and/or use of iterative reconstruction technique. CONTRAST:  44mL OMNIPAQUE IOHEXOL 350 MG/ML SOLN COMPARISON:  The dated 02/12/2023. FINDINGS: Cardiovascular: There  is no cardiomegaly or pericardial effusion. Coronary vascular calcification of the LAD. Mild atherosclerotic calcification of the thoracic aorta. No aneurysmal dilatation. There is apparent occlusion of the proximal segment of the left subclavian artery, likely chronic. This is however poorly visualized and suboptimally evaluated. No pulmonary artery embolus identified. Mediastinum/Nodes: No hilar or mediastinal adenopathy. The esophagus is grossly unremarkable. No mediastinal fluid collection. Lungs/Pleura: There is eventration of the left hemidiaphragm with left lung base consolidation which may represent atelectasis or infiltrate. Minimal right lung base atelectasis. There is no pleural effusion pneumothorax. The central airways are patent. Upper Abdomen: No acute abnormality. Musculoskeletal: Osteopenia with degenerative changes of the spine. No acute osseous pathology. Review of the MIP images confirms the above findings. IMPRESSION: 1. No CT evidence of pulmonary artery embolus. 2. Eventration of the left hemidiaphragm with left lung base atelectasis or infiltrate. 3. 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 4.  Aortic Atherosclerosis (ICD10-I70.0). Electronically Signed   By: Anner Crete M.D.   On: 02/12/2023 22:01   DG Chest Port 1 View  Result Date:  02/12/2023 CLINICAL DATA:  Short of breath EXAM: PORTABLE CHEST 1 VIEW COMPARISON:  02/10/2023 FINDINGS: Single frontal view of the chest demonstrates an unremarkable cardiac silhouette. Lung volumes are diminished, with persistent left basilar consolidation. Improved aeration at the right lung base. No effusion or pneumothorax. No acute bony abnormalities. IMPRESSION: 1. Low lung volumes, with persistent left basilar consolidation. Favor pneumonia over atelectasis. Electronically Signed   By: Randa Ngo M.D.   On: 02/12/2023 17:22   ECHOCARDIOGRAM COMPLETE  Result Date: 02/11/2023    ECHOCARDIOGRAM REPORT   Patient Name:   [name] Date of Exam: 02/11/2023 Medical Rec #:  [id number]     Height:       72.0 in Accession #:    [id number]    Weight:       159.2 lb Date of Birth:  [birth year]      BSA:          1.934 m Patient Age:    45 years      BP:           135/88 mmHg Patient Gender: M             HR:           84 bpm. Exam Location:  [facility] Procedure: 2D Echo and Intracardiac Opacification Agent Indications:     Atrial Fibrillation I48.91  History:         Patient has prior history of Echocardiogram examinations, most                  recent 12/01/2019.  Sonographer:     Kathlen Brunswick RDCS Referring Phys:  [id number] Micah Flesher NE[place]ON Diagnosing Phys: Kate Sable MD  Sonographer Comments: Technically difficult study due to poor echo windows and no subcostal window. Image acquisition challenging due to respiratory motion. IMPRESSIONS  1. Left ventricular ejection fraction, by estimation, is 55 to 60%. Left ventricular ejection fraction by 2D MOD biplane is 59.6 %. The left ventricle has normal function. The left ventricle has no regional wall motion abnormalities. There is mild left ventricular hypertrophy. Left ventricular diastolic parameters are consistent with Grade I diastolic dysfunction (impaired relaxation).  2. Right ventricular systolic function is normal. The right ventricular size is normal.  3.  The mitral valve is grossly normal. No evidence of mitral valve regurgitation.  4. The aortic valve is  tricuspid. Aortic valve regurgitation is not visualized. Aortic valve sclerosis/calcification is present, without any evidence of aortic stenosis. FINDINGS  Left Ventricle: Left ventricular ejection fraction, by estimation, is 55 to 60%. Left ventricular ejection fraction by 2D MOD biplane is 59.6 %. The left ventricle has normal function. The left ventricle has no regional wall motion abnormalities. Definity contrast agent was given IV to delineate the left ventricular endocardial borders. The left ventricular internal cavity size was normal in size. There is mild left ventricular hypertrophy. Left ventricular diastolic parameters are consistent with Grade I diastolic dysfunction (impaired relaxation). Right Ventricle: The right ventricular size is normal. No increase in right ventricular wall thickness. Right ventricular systolic function is normal. Left Atrium: Left atrial size was normal in size. Right Atrium: Right atrial size was normal in size. Pericardium: There is no evidence of pericardial effusion. Mitral Valve: The mitral valve is grossly normal. Mild mitral annular calcification. No evidence of mitral valve regurgitation. Tricuspid Valve: The tricuspid valve is not well visualized. Tricuspid valve regurgitation is not demonstrated. Aortic Valve: The aortic valve is tricuspid. Aortic valve regurgitation is not visualized. Aortic valve sclerosis/calcification is present, without any evidence of aortic stenosis. Aortic valve peak gradient measures 15.1 mmHg. Pulmonic Valve: The pulmonic valve was not well visualized. Pulmonic valve regurgitation is trivial. Aorta: The aortic root and ascending aorta are structurally normal, with no evidence of dilitation. Venous: The inferior vena cava was not well visualized. IAS/Shunts: No atrial level shunt detected by color flow Doppler.  LEFT VENTRICLE PLAX 2D                         Biplane EF (MOD) LVIDd:         4.30 cm         LV Biplane EF:   Left LVIDs:         3.10 cm                          ventricular LV PW:         1.00 cm                          ejection LV IVS:        1.25 cm                          fraction by LVOT diam:     1.90 cm                          2D MOD LV SV:         50                               biplane is LV SV Index:   26                               59.6 %. LVOT Area:     2.84 cm                                Diastology  LV e' medial:    4.79 cm/s LV Volumes (MOD)               LV E/e' medial:  18.8 LV vol d, MOD    69.1 ml       LV e' lateral:   4.57 cm/s A2C:                           LV E/e' lateral: 19.7 LV vol d, MOD    81.4 ml A4C: LV vol s, MOD    28.5 ml A2C: LV vol s, MOD    31.2 ml A4C: LV SV MOD A2C:   40.6 ml LV SV MOD A4C:   81.4 ml LV SV MOD BP:    46.6 ml RIGHT VENTRICLE RV Basal diam:  2.90 cm RV S prime:     13.90 cm/s TAPSE (M-mode): 3.1 cm LEFT ATRIUM             Index        RIGHT ATRIUM           Index LA diam:        3.20 cm 1.66 cm/m   RA Area:     10.80 cm LA Vol (A2C):   32.1 ml 16.60 ml/m  RA Volume:   21.30 ml  11.02 ml/m LA Vol (A4C):   31.6 ml 16.34 ml/m LA Biplane Vol: 33.6 ml 17.38 ml/m  AORTIC VALVE                 PULMONIC VALVE AV Area (Vmax): 1.19 cm     PV Vmax:        0.90 m/s AV Vmax:        194.50 cm/s  PV Peak grad:   3.2 mmHg AV Peak Grad:   15.1 mmHg    RVOT Peak grad: 1 mmHg LVOT Vmax:      81.40 cm/s LVOT Vmean:     51.000 cm/s LVOT VTI:       0.178 m  AORTA Ao Root diam: 3.30 cm Ao Asc diam:  3.40 cm MITRAL VALVE MV Area (PHT): 3.79 cm     SHUNTS MV Decel Time: 200 msec     Systemic VTI:  0.18 m MV E velocity: 90.10 cm/s   Systemic Diam: 1.90 cm MV A velocity: 110.00 cm/s MV E/A ratio:  0.82 Kate Sable MD Electronically signed by Kate Sable MD Signature Date/Time: 02/11/2023/1:34:31 PM    Final    MR BRAIN WO CONTRAST  Result Date:  02/11/2023 CLINICAL DATA:  Acute stroke suspected EXAM: MRI HEAD WITHOUT CONTRAST TECHNIQUE: Multiplanar, multiecho pulse sequences of the brain and surrounding structures were obtained without intravenous contrast. COMPARISON:  Yesterday FINDINGS: Brain: No acute infarction, hemorrhage, hydrocephalus, extra-axial collection or mass lesion. Chronic small vessel ischemia in the cerebral white matter. Small chronic right frontal parietal cortex infarcts. Generalized cerebral volume loss. Chronic widening of a right frontal sulcus measuring 2.3 cm, suspect focal hygroma. Bubbly T2 hyperintensity along the left occipital convexity is an arachnoid granulation that is stable from 2021. Vascular: Major flow voids are preserved Skull and upper cervical spine: Normal marrow signal Sinuses/Orbits: Bilateral mastoid opacification with negative nasopharynx. Left mastoid opacification is new from before. IMPRESSION: 1. No acute finding or intracranial change since 2021. 2. Bilateral mastoid opacification with normal nasopharynx. Electronically Signed   By: Jorje Guild M.D.   On: 02/11/2023 09:49   CT HEAD WO CONTRAST (  5MM)  Result Date: 02/10/2023 CLINICAL DATA:  89 year old male with history of breakthrough seizure. Prior history of stroke. EXAM: CT HEAD WITHOUT CONTRAST TECHNIQUE: Contiguous axial images were obtained from the base of the skull through the vertex without intravenous contrast. RADIATION DOSE REDUCTION: This exam was performed according to the departmental dose-optimization program which includes automated exposure control, adjustment of the mA and/or kV according to patient size and/or use of iterative reconstruction technique. COMPARISON:  Head CT 06/18/2020. FINDINGS: Brain: Moderate cerebral and mild cerebellar atrophy. Patchy and confluent areas of decreased attenuation are noted throughout the deep and periventricular white matter of the cerebral hemispheres bilaterally, compatible with chronic  microvascular ischemic disease. No evidence of acute infarction, hemorrhage, hydrocephalus, extra-axial collection or mass lesion/mass effect. Vascular: No hyperdense vessel or unexpected calcification. Skull: Normal. Negative for fracture or focal lesion. Sinuses/Orbits: No acute finding. Other: None. IMPRESSION: 1. No acute intracranial abnormalities. 2. Moderate cerebral and mild cerebellar atrophy with chronic microvascular ischemic changes in the cerebral white matter, as above. Electronically Signed   By: Vinnie Langton M.D.   On: 02/10/2023 05:44   DG Chest Portable 1 View  Result Date: 02/10/2023 CLINICAL DATA:  Seizure.  Poorly response EXAM: PORTABLE CHEST 1 VIEW COMPARISON:  12/20/2022 FINDINGS: Low volume chest with indistinct opacity at the bases. No edema, effusion, or pneumothorax. Normal heart size. Congested appearance of central vessels. IMPRESSION: Low volume chest with atelectasis or pneumonia at the lung bases. Electronically Signed   By: Jorje Guild M.D.   On: 02/10/2023 05:38    Microbiology: Results for orders placed or performed during the hospital encounter of 02/10/23  Blood culture (routine x 2)     Status: None (Preliminary result)   Collection Time: 02/10/23  5:19 AM   Specimen: BLOOD  Result Value Ref Range Status   Specimen Description BLOOD BLOOD LEFT FOREARM  Final   Special Requests   Final    BOTTLES DRAWN AEROBIC AND ANAEROBIC Blood Culture adequate volume   Culture   Final    NO GROWTH 4 DAYS Performed at [facility], [address] [place], [place] [address]    Report Status PENDING  Incomplete  Blood culture (routine x 2)     Status: None (Preliminary result)   Collection Time: 02/10/23  5:19 AM   Specimen: BLOOD  Result Value Ref Range Status   Specimen Description BLOOD BLOOD LEFT WRIST  Final   Special Requests   Final    BOTTLES DRAWN AEROBIC AND ANAEROBIC Blood Culture results may not be optimal due to an inadequate volume of  blood received in culture bottles   Culture   Final    NO GROWTH 4 DAYS Performed at [facility], [address] [place], [place] [address]    Report Status PENDING  Incomplete    Labs: CBC: Recent Labs  Lab 02/10/23 0516 02/10/23 0743 02/11/23 0622 02/13/23 0807  WBC 7.2 8.9 7.7 6.3  NEUTROABS 5.2  --   --   --   HGB 8.2* 8.8* 9.1* 8.6*  HCT 25.7* 27.4* 27.2* 25.9*  MCV 92.8 91.6 90.1 90.2  PLT 168 189 185 [id number]   Basic Metabolic Panel: Recent Labs  Lab 02/10/23 0516 02/10/23 0927 02/10/23 1534 02/10/23 2126 02/11/23 0622 02/11/23 1301 02/11/23 2046 02/12/23 0627 02/13/23 0807  NA 137   < > 139 140 141 138 133* 135 135  K 3.0*   < > 3.7 3.8 3.6 4.1 4.0 4.1 3.9  CL  104   < > 110 108 109 106 102 104 103  CO2 20*   < > 22 23 24 23 23 22 24   GLUCOSE 107*   < > 88 96 76 103* 121* 94 79  BUN 16   < > 14 12 11 13 14 14 15   CREATININE 0.96   < > 0.86 0.91 0.85 0.94 0.96 1.03 0.95  CALCIUM 5.5*   < > 6.3* 6.8* 7.0* 7.1* 6.9* 7.4* 7.6*  MG <0.5*  --  1.4* 1.5*  --   --   --   --  1.7   < > = values in this interval not displayed.   Liver Function Tests: Recent Labs  Lab 02/10/23 0516 02/10/23 2126 02/13/23 0807  AST 21  --  19  ALT 9  --  9  ALKPHOS 49  --  49  BILITOT 0.5  --  0.7  PROT 5.2*  --  5.0*  ALBUMIN 2.6* 2.9* 2.3*   CBG: Recent Labs  Lab 02/12/23 1353  GLUCAP 84    Discharge time spent: greater than 30 minutes.  Signed: Sharen Hones, MD [facility] 02/15/2023

## 2023-02-15 NOTE — TOC Transition Note (Signed)
Transition of Care [facility]) - CM/SW Discharge Note   Patient Details  Name: [name] MRN: [id number] Date of Birth: Feb 03, 1932  [facility] CM/SW Contact:  Gerilyn Pilgrim, LCSW Phone Number: 02/15/2023, 10:36 AM   Clinical Narrative:  Pt discharging home today. Family has chosen to go home and resume [facility] services. Pt active with [facility] for PT/OT/RN. [place] with [place] notified. CSW signing off.              Patient Goals and CMS Choice      Discharge Placement                         Discharge Plan and Services Additional resources added to the After Visit Summary for                                       Social Determinants of Health (SDOH) Interventions SDOH Screenings   Food Insecurity: No Food Insecurity (02/10/2023)  Housing: Low Risk  (02/10/2023)  Transportation Needs: No Transportation Needs (02/10/2023)  Utilities: Not At Risk (02/10/2023)  Tobacco Use: Low Risk  (02/15/2023)     Readmission Risk Interventions     No data to display

## 2023-02-17 LAB — PTH, INTACT AND CALCIUM
Calcium, Total (PTH): 2.6 mg/dL — CL (ref 8.6–10.2)
PTH: 47 pg/mL (ref 15–65)

## 2023-02-17 LAB — CALCIUM, IONIZED: Calcium, Ionized, Serum: <3 mg/dL — ABNORMAL LOW (ref 4.5–5.6)

## 2023-04-14 DEATH — deceased

## 2023-05-02 ENCOUNTER — Telehealth: Payer: Self-pay | Admitting: Internal Medicine

## 2023-05-02 NOTE — Telephone Encounter (Signed)
Pt wife called and stated that pt has passed away. Future appts have been canceled.

## 2023-05-07 ENCOUNTER — Ambulatory Visit: Payer: PPO | Admitting: Internal Medicine

## 2023-05-07 ENCOUNTER — Other Ambulatory Visit: Payer: PPO
